# Patient Record
Sex: Female | Born: 1970
Health system: Southern US, Community
[De-identification: ages and names within clinical notes are randomized; demographics above are authoritative.]

## PROBLEM LIST (undated history)

## (undated) DIAGNOSIS — F419 Anxiety disorder, unspecified: Secondary | ICD-10-CM

## (undated) DIAGNOSIS — N281 Cyst of kidney, acquired: Secondary | ICD-10-CM

## (undated) DIAGNOSIS — F32A Depression, unspecified: Secondary | ICD-10-CM

## (undated) DIAGNOSIS — F329 Major depressive disorder, single episode, unspecified: Secondary | ICD-10-CM

## (undated) DIAGNOSIS — M1990 Unspecified osteoarthritis, unspecified site: Secondary | ICD-10-CM

## (undated) DIAGNOSIS — M545 Low back pain: Secondary | ICD-10-CM

## (undated) DIAGNOSIS — M069 Rheumatoid arthritis, unspecified: Secondary | ICD-10-CM

## (undated) DIAGNOSIS — G8929 Other chronic pain: Secondary | ICD-10-CM

## (undated) DIAGNOSIS — T7840XA Allergy, unspecified, initial encounter: Secondary | ICD-10-CM

## (undated) HISTORY — DX: Cyst of kidney, acquired: N28.1

## (undated) HISTORY — DX: Low back pain: M54.5

## (undated) HISTORY — DX: Allergy, unspecified, initial encounter: T78.40XA

## (undated) HISTORY — PX: TONSILLECTOMY: SUR1361

## (undated) HISTORY — PX: ABDOMINAL HYSTERECTOMY: SHX81

## (undated) HISTORY — DX: Other chronic pain: G89.29

## (undated) HISTORY — DX: Unspecified osteoarthritis, unspecified site: M19.90

## (undated) HISTORY — PX: BACK SURGERY: SHX140

## (undated) HISTORY — DX: Major depressive disorder, single episode, unspecified: F32.9

## (undated) HISTORY — DX: Rheumatoid arthritis, unspecified: M06.9

## (undated) HISTORY — DX: Depression, unspecified: F32.A

## (undated) HISTORY — PX: BREAST CYST ASPIRATION: SHX578

## (undated) HISTORY — DX: Anxiety disorder, unspecified: F41.9

## (undated) HISTORY — PX: SPINE SURGERY: SHX786

---

## 2005-07-08 ENCOUNTER — Ambulatory Visit: Payer: Self-pay | Admitting: Family Medicine

## 2005-08-21 ENCOUNTER — Ambulatory Visit: Payer: Self-pay | Admitting: Unknown Physician Specialty

## 2005-10-21 ENCOUNTER — Ambulatory Visit: Payer: Self-pay | Admitting: Unknown Physician Specialty

## 2009-08-14 ENCOUNTER — Ambulatory Visit: Payer: Self-pay | Admitting: Unknown Physician Specialty

## 2009-08-25 ENCOUNTER — Ambulatory Visit: Payer: Self-pay | Admitting: Unknown Physician Specialty

## 2009-11-30 ENCOUNTER — Ambulatory Visit: Payer: Self-pay | Admitting: General Surgery

## 2010-09-10 ENCOUNTER — Ambulatory Visit: Payer: Self-pay | Admitting: Unknown Physician Specialty

## 2010-09-21 ENCOUNTER — Ambulatory Visit: Payer: Self-pay | Admitting: Unknown Physician Specialty

## 2011-03-25 ENCOUNTER — Ambulatory Visit (HOSPITAL_COMMUNITY): Payer: PRIVATE HEALTH INSURANCE

## 2011-03-25 ENCOUNTER — Ambulatory Visit (HOSPITAL_COMMUNITY)
Admission: RE | Admit: 2011-03-25 | Discharge: 2011-03-26 | Disposition: A | Discharge status: Home Health | Payer: PRIVATE HEALTH INSURANCE | Source: Ambulatory Visit | Attending: Neurosurgery | Admitting: Neurosurgery

## 2011-03-25 DIAGNOSIS — M51379 Other intervertebral disc degeneration, lumbosacral region without mention of lumbar back pain or lower extremity pain: Secondary | ICD-10-CM | POA: Insufficient documentation

## 2011-03-25 DIAGNOSIS — M5126 Other intervertebral disc displacement, lumbar region: Secondary | ICD-10-CM | POA: Insufficient documentation

## 2011-03-25 DIAGNOSIS — M47817 Spondylosis without myelopathy or radiculopathy, lumbosacral region: Secondary | ICD-10-CM | POA: Insufficient documentation

## 2011-03-25 DIAGNOSIS — M5137 Other intervertebral disc degeneration, lumbosacral region: Secondary | ICD-10-CM | POA: Insufficient documentation

## 2011-03-25 LAB — CBC
HCT: 41 % (ref 36.0–46.0)
Hemoglobin: 14.1 g/dL (ref 12.0–15.0)
MCH: 30.4 pg (ref 26.0–34.0)
MCHC: 34.4 g/dL (ref 30.0–36.0)
MCV: 88.4 fL (ref 78.0–100.0)
Platelets: 222 10*3/uL (ref 150–400)
RBC: 4.64 MIL/uL (ref 3.87–5.11)
RDW: 13 % (ref 11.5–15.5)
WBC: 9.8 10*3/uL (ref 4.0–10.5)

## 2011-03-25 LAB — SURGICAL PCR SCREEN
MRSA, PCR: NEGATIVE
Staphylococcus aureus: NEGATIVE

## 2011-03-27 NOTE — Op Note (Signed)
[name], [name] NO.:  [id number]  MEDICAL RECORD NO.:  [id number]  LOCATION:  3528                         FACILITY:  [facility]  PHYSICIAN:  Hewitt Shorts, M.D.DATE OF BIRTH:  1970/10/27  DATE OF PROCEDURE:  03/25/2011 DATE OF DISCHARGE:                              OPERATIVE REPORT   PREOPERATIVE DIAGNOSES: 1. Left L5-S1 lumbar disk herniation. 2. Lumbar degenerative disk disease. 3. Lumbar spondylosis. 4. Lumbar radiculopathy.  POSTOPERATIVE DIAGNOSES: 1. Left L5-S1 lumbar disk herniation. 2. Lumbar degenerative disk disease. 3. Lumbar spondylosis. 4. Lumbar radiculopathy.  PROCEDURE:  Left L5-S1 lumbar laminotomy and microdiskectomy with microdissection, microsurgical technique and the operating microscope.  SURGEON:  Hewitt Shorts, MD  ASSISTANT:  Hilda Lias, MD  ANESTHESIA:  General endotracheal.  INDICATION:  The patient is a 40 year old woman who presented with disabling left lumbar radiculopathy.  She was found by MRI scan to have a large left L5-S1 lumbar disk herniation.  Decision was made to proceed with elective laminotomy and microdiskectomy.  PROCEDURE:  The patient was brought to the operating room, placed under general endotracheal anesthesia.  The patient was turned to prone position. Lumbar region was prepped with Betadine soap and solution, draped in sterile fashion.  The midline was infiltrated with local anesthetic with epinephrine and x-Montesdeoca was taken, the L5-S1 level identified and then a midline incision was made, carried down through the subcutaneous tissue.  Bipolar cautery and electrocautery was used to maintain hemostasis.  Dissection was carried down to the lumbar fascia, which was incised in the left side of the midline. The paraspinal muscles were dissected from the spinous process and lamina in a subperiosteal fashion.  Self-retaining retractor was placed and x-Behring taken.  The L5-S1 interlaminar  space was identified.  The operating microscope was draped and brought into the field to provide for additional navigation, illumination, and visualization.  The remainder of decompression was performed using microdissection and microsurgical technique. Laminotomy was performed using the high-speed drill and Kerrison punches.  The ligamentum flavum was carefully removed, and then we identified the thecal sac and exiting left S1 nerve root.  These structures were gently retracted medially, disk herniation identified. The remaining annular fibers were incised and the fragment extruded into the exposure was removed using pituitary rongeur and a single fragment. We examined the epidural space and no further fragments were found.  It was felt that good decompression of the thecal sac and nerve root had been achieved and it was felt that no further decompression would be achieved by entering into the disk space.  Epidural veins were coagulated as needed and wound irrigated with bacitracin solution. Hemostasis was established, confirmed.  Then, we instilled 2 mL of fentanyl with 80 mg of Depo-Medrol into the epidural space and proceeded with closure.  Deep fascia was closed with interrupted undyed 1 Vicryl sutures.  Scarpa fascia was closed with interrupted undyed 0 and 2-0 Vicryl sutures and the subcutaneous and subcuticular closed with interrupted inverted 2-0 undyed Vicryl sutures.  Skin was closed with Dermabond.  Procedure was tolerated well.  Estimated blood loss was 25 mL. Sponge and needle count correct.  Following surgery,  the patient was turned back to supine position to be reversed from the anesthetic, extubated, and transferred to the recovery room for further care.     Hewitt Shorts, M.D.     RWN/MEDQ  D:  03/25/2011  T:  03/26/2011  Job:  [id number]  Electronically Signed by Shirlean Kelly M.D. on 03/27/2011 01:53:25 PM

## 2011-04-24 ENCOUNTER — Ambulatory Visit: Payer: Self-pay | Admitting: Unknown Physician Specialty

## 2011-12-26 ENCOUNTER — Emergency Department: Payer: Self-pay | Admitting: Emergency Medicine

## 2011-12-26 LAB — COMPREHENSIVE METABOLIC PANEL
Albumin: 3.9 g/dL (ref 3.4–5.0)
Alkaline Phosphatase: 61 U/L (ref 50–136)
BUN: 19 mg/dL — ABNORMAL HIGH (ref 7–18)
Bilirubin,Total: 0.5 mg/dL (ref 0.2–1.0)
Creatinine: 0.87 mg/dL (ref 0.60–1.30)
Glucose: 166 mg/dL — ABNORMAL HIGH (ref 65–99)
Osmolality: 282 (ref 275–301)
Sodium: 138 mmol/L (ref 136–145)
Total Protein: 7.7 g/dL (ref 6.4–8.2)

## 2011-12-26 LAB — URINALYSIS, COMPLETE
Blood: NEGATIVE
Glucose,UR: NEGATIVE mg/dL (ref 0–75)
Nitrite: NEGATIVE
RBC,UR: 4 /HPF (ref 0–5)
Squamous Epithelial: 21
WBC UR: 3 /HPF (ref 0–5)

## 2011-12-26 LAB — CBC
HCT: 45.4 % (ref 35.0–47.0)
HGB: 14.7 g/dL (ref 12.0–16.0)
WBC: 11.8 10*3/uL — ABNORMAL HIGH (ref 3.6–11.0)

## 2013-04-16 ENCOUNTER — Emergency Department: Payer: Self-pay | Admitting: Internal Medicine

## 2013-04-16 LAB — COMPREHENSIVE METABOLIC PANEL
Alkaline Phosphatase: 61 U/L (ref 50–136)
Calcium, Total: 8.6 mg/dL (ref 8.5–10.1)
Chloride: 107 mmol/L (ref 98–107)
Co2: 27 mmol/L (ref 21–32)
EGFR (African American): >60
Osmolality: 285 (ref 275–301)
Potassium: 3.6 mmol/L (ref 3.5–5.1)
SGOT(AST): 21 U/L (ref 15–37)
SGPT (ALT): 21 U/L (ref 12–78)

## 2013-04-16 LAB — URINALYSIS, COMPLETE
RBC,UR: 3 /HPF (ref 0–5)
Squamous Epithelial: 7

## 2013-04-16 LAB — CBC
HCT: 39.1 % (ref 35.0–47.0)
MCH: 31 pg (ref 26.0–34.0)
MCHC: 35 g/dL (ref 32.0–36.0)
Platelet: 196 10*3/uL (ref 150–440)
RBC: 4.41 10*6/uL (ref 3.80–5.20)
RDW: 12.7 % (ref 11.5–14.5)
WBC: 8.7 10*3/uL (ref 3.6–11.0)

## 2013-04-16 LAB — CK TOTAL AND CKMB (NOT AT ARMC)
CK, Total: 75 U/L (ref 21–215)
CK-MB: 0.6 ng/mL (ref 0.5–3.6)

## 2013-04-16 LAB — TROPONIN I: Troponin-I: <0.02 ng/mL

## 2014-11-17 ENCOUNTER — Other Ambulatory Visit: Payer: Self-pay | Admitting: Family Medicine

## 2014-11-17 NOTE — Telephone Encounter (Signed)
Pt called stated she needs a refill on Cymbalta. Pharm is [facility] in [place]. Patient stated she took the last dose yesterday. Please call pt with any issues @ [phone]. Thanks.

## 2014-11-17 NOTE — Telephone Encounter (Signed)
Please schedule appt with MAC per PP notes. Thanks!

## 2014-11-18 ENCOUNTER — Telehealth: Payer: Self-pay | Admitting: Family Medicine

## 2014-11-18 NOTE — Telephone Encounter (Signed)
Pt notified that med was sent to pharm yesterday.

## 2014-11-18 NOTE — Telephone Encounter (Signed)
Pt called stated she needs a refill on Symbalta. Pt stated she ran out 2 days ago. Pharm is [facility] [place]. Please call pt with any issues @ [phone]. Pt stated she is leaving to go on vacation tomorrow morning, pt went w/o medication yesterday as well as today.

## 2014-12-15 ENCOUNTER — Other Ambulatory Visit: Payer: Self-pay | Admitting: Family Medicine

## 2014-12-19 ENCOUNTER — Other Ambulatory Visit: Payer: Self-pay

## 2014-12-19 MED ORDER — DULOXETINE HCL 60 MG PO CPEP: 60.0000 mg | ORAL_CAPSULE | Freq: Every day | ORAL | Status: DC

## 2014-12-19 NOTE — Telephone Encounter (Signed)
Pt wants to know if she can get it set up to where she has automatic refills on this RX instead of having to call in every month. Please call pt about this ASAP.

## 2014-12-19 NOTE — Telephone Encounter (Signed)
Patient needs to be seen I sent in refill, but she needs to be seen in the office please She no showed in May

## 2014-12-29 NOTE — Telephone Encounter (Signed)
Busy dial tone

## 2014-12-29 NOTE — Telephone Encounter (Signed)
Patient has a follow up appointment scheduled for July 25th.

## 2015-01-02 ENCOUNTER — Inpatient Hospital Stay: Payer: Self-pay | Admitting: Family Medicine

## 2015-01-19 ENCOUNTER — Other Ambulatory Visit: Payer: Self-pay

## 2015-01-19 MED ORDER — DULOXETINE HCL 60 MG PO CPEP: 60.0000 mg | ORAL_CAPSULE | Freq: Every day | ORAL | Status: DC

## 2015-01-19 NOTE — Telephone Encounter (Signed)
She no showed in May She no showed in July This is the last time I refill this without an appointment If she does not keep or reschedules this next appointment, will talk to office manager about noncompliance and letter

## 2015-01-19 NOTE — Telephone Encounter (Signed)
She scheduled an appointment with you for 01/26/15. She wants to know if you can call in a rx for Cymbalta to her pharmacy.

## 2015-01-26 ENCOUNTER — Encounter: Payer: Self-pay | Admitting: Family Medicine

## 2015-01-26 ENCOUNTER — Ambulatory Visit: Payer: Self-pay | Admitting: Family Medicine

## 2015-01-26 NOTE — Progress Notes (Signed)
This is patient's third consecutive NO SHOW I recommend sending a letter to notify her that if she does not schedule a visit and keep it in the next 30 days for medication refills and montoring, that we will dismiss her from the practice and she'll need to find another prescriber Dr. Sanda Klein

## 2015-02-14 ENCOUNTER — Encounter: Payer: Self-pay | Admitting: Family Medicine

## 2015-02-23 ENCOUNTER — Ambulatory Visit: Payer: Self-pay | Admitting: Family Medicine

## 2015-02-23 ENCOUNTER — Encounter: Payer: Self-pay | Admitting: Family Medicine

## 2015-02-23 NOTE — Progress Notes (Signed)
This was patient's fourth consecutive NO SHOW Sending to office manager

## 2015-03-01 ENCOUNTER — Other Ambulatory Visit: Payer: Self-pay

## 2015-03-01 MED ORDER — DULOXETINE HCL 60 MG PO CPEP: 60.0000 mg | ORAL_CAPSULE | Freq: Every day | ORAL | Status: DC

## 2015-03-01 NOTE — Telephone Encounter (Signed)
Patient called to apologize for missing her appointment on 02/23/15 and rescheduled for 03/10/15.  Pt also request a refill for Cymbalta.  [facility] uses [facility] in [place].  Please call to advise if RX refill is approved.

## 2015-03-01 NOTE — Telephone Encounter (Signed)
Routing to provider  

## 2015-03-01 NOTE — Telephone Encounter (Signed)
She has had four no shows in a row, so I really am not going to approve any more refills beyond this amount (30 pills sent) until she is seen; if she does not keep appointment, if she is not seen in the next 30 days, I recommend she be dismissed for noncompliance

## 2015-03-02 NOTE — Telephone Encounter (Signed)
Patient notified

## 2015-03-10 ENCOUNTER — Encounter: Payer: Self-pay | Admitting: Family Medicine

## 2015-03-10 ENCOUNTER — Ambulatory Visit (INDEPENDENT_AMBULATORY_CARE_PROVIDER_SITE_OTHER): Payer: PRIVATE HEALTH INSURANCE | Admitting: Family Medicine

## 2015-03-10 VITALS — BP 126/86 | HR 79 | Temp 97.3°F | Ht 66.25 in | Wt 168.0 lb

## 2015-03-10 DIAGNOSIS — R634 Abnormal weight loss: Secondary | ICD-10-CM | POA: Diagnosis not present

## 2015-03-10 DIAGNOSIS — M47817 Spondylosis without myelopathy or radiculopathy, lumbosacral region: Secondary | ICD-10-CM | POA: Diagnosis not present

## 2015-03-10 DIAGNOSIS — R5383 Other fatigue: Secondary | ICD-10-CM | POA: Diagnosis not present

## 2015-03-10 DIAGNOSIS — E559 Vitamin D deficiency, unspecified: Secondary | ICD-10-CM | POA: Diagnosis not present

## 2015-03-10 DIAGNOSIS — Z6379 Other stressful life events affecting family and household: Secondary | ICD-10-CM | POA: Insufficient documentation

## 2015-03-10 DIAGNOSIS — M4806 Spinal stenosis, lumbar region: Secondary | ICD-10-CM

## 2015-03-10 DIAGNOSIS — M48061 Spinal stenosis, lumbar region without neurogenic claudication: Secondary | ICD-10-CM

## 2015-03-10 DIAGNOSIS — Z0000 Encounter for general adult medical examination without abnormal findings: Secondary | ICD-10-CM

## 2015-03-10 DIAGNOSIS — F4321 Adjustment disorder with depressed mood: Secondary | ICD-10-CM | POA: Diagnosis not present

## 2015-03-10 MED ORDER — DULOXETINE HCL 30 MG PO CPEP: 30.0000 mg | ORAL_CAPSULE | Freq: Every day | ORAL | Status: DC

## 2015-03-10 NOTE — Progress Notes (Signed)
BP 126/86 mmHg  Pulse 79  Temp(Src) 97.3 F (36.3 C)  Ht 5' 6.25" (1.683 m)  Wt 168 lb (76.204 kg)  BMI 26.90 kg/m2  SpO2 97%   Subjective:    Patient ID: [name], female    DOB: 1970-12-07, 44 y.o.   MRN: [id number]  HPI: [name] is a 44 y.o. female  Chief Complaint  Patient presents with  . Depression    would also like to maybe find a conselor  . Anxiety   Patient is here for follow-up after several missed visits; she has depression and it has worsened recently; she is still taking her cymbalta, 60 mg daily She and her husband have been separated for 7 months; she is not in any danger or has any fear for herself or her child; not in any counseling She has lost significant weight during this time, which she attributes to stress, not eating well  Depression screen PHQ 2/9 03/10/2015  Decreased Interest 1  Down, Depressed, Hopeless 2  PHQ - 2 Score 3  Altered sleeping 2  Tired, decreased energy 2  Change in appetite 2  Feeling bad or failure about yourself  2  Trouble concentrating 0  Moving slowly or fidgety/restless 0  Suicidal thoughts 0  PHQ-9 Score 11  Difficult doing work/chores Somewhat difficult   She has chronic back pain and sees another provider for that; she does believe the cymbalta helps with the back pain  Relevant past medical, surgical, family and social history reviewed and updated as indicated. Interim medical history since our last visit reviewed.  Personal hx of low vit D; chronic back pain  Mother has diabetes; no thyroid disease in the family Allergies and medications reviewed and updated.  Review of Systems  Constitutional: Positive for unexpected weight change (from stress).  HENT: Negative for trouble swallowing and voice change.   Eyes: Negative for visual disturbance.  Respiratory: Negative for cough.   Cardiovascular: Negative for palpitations.  Gastrointestinal: Positive for constipation (just a little, takes narcotics for  back pain; not any worse in last six months). Negative for blood in stool.  Endocrine: Negative for polydipsia.  Genitourinary: Negative for hematuria.  Skin:       Has noticed a little hair loss, some stress maybe; no dry skin  Neurological: Negative for tremors.  Psychiatric/Behavioral: Positive for sleep disturbance (not real bad) and dysphoric mood. Negative for self-injury.   Per HPI unless specifically indicated above     Objective:    BP 126/86 mmHg  Pulse 79  Temp(Src) 97.3 F (36.3 C)  Ht 5' 6.25" (1.683 m)  Wt 168 lb (76.204 kg)  BMI 26.90 kg/m2  SpO2 97%  Wt Readings from Last 3 Encounters:  03/10/15 168 lb (76.204 kg)  04/12/14 183 lb (83.008 kg)    Physical Exam  Constitutional: She appears well-developed and well-nourished.  Weight loss 15 pounds noted  HENT:  Mouth/Throat: Mucous membranes are normal.  Eyes: EOM are normal. No scleral icterus.  Neck: No thyromegaly present.  Cardiovascular: Normal rate and regular rhythm.   Pulmonary/Chest: Effort normal and breath sounds normal.  Abdominal: She exhibits no distension.  Neurological: She is alert. She displays no tremor. Gait normal.  Skin:  No appreciable scalp hair loss or visible alopecia  Psychiatric: Her behavior is normal. Judgment normal. Her mood appears not anxious. Her affect is blunt. Her affect is not labile and not inappropriate. Her speech is not delayed. She is not slowed and  not withdrawn. Cognition and memory are normal. She exhibits a depressed mood. She expresses no homicidal and no suicidal ideation.  Good hygiene, good eye contact with examiner      Assessment & Plan:   Problem List Items Addressed This Visit      Musculoskeletal and Integument   Lumbosacral spondylosis without myelopathy    On pain medicines from outside provider; reviewed note from Dr. Esmeralda Arthur dated January 11, 2013        Other   Abnormal weight loss    Likely related to eating pattern change with marital  separation, stress; will check TSH to make sure not thryoid-related      Relevant Orders   TSH (Completed)   Adjustment disorder with depressed mood - Primary    Patient going through marital separation right now; suggested counseling and gave her list of counselors to review, patient to call and schedule her own appt; supportive listening provided; no abuse, no concern for danger; increase cymbalta from 60 mg daily to 90 mg daily; seek medical care if needed, dark thoughts; call before next visit if needed; return for close f/u; see AVS      Stressful life events affecting family and household    Marital separation; encouraged her to start counseling      Preventative health care    Checking other labs today, so will include yearly screening labs (CPE NOT done today, just the lab component)      Relevant Orders   CBC with Differential/Platelet (Completed)   Comprehensive metabolic panel (Completed)   Lipid Panel w/o Chol/HDL Ratio (Completed)   Vitamin D deficiency    Check level and supplement if needed, as low vit D can exacerbate depressed mood, cause fatigue      Relevant Orders   Vit D  25 hydroxy (rtn osteoporosis monitoring) (Completed)   Spinal stenosis of lumbar region    On pain medicines from outside provider; reviewed note from Dr. Esmeralda Arthur dated January 11, 2013       Other Visit Diagnoses    Decreased energy        Relevant Orders    Vitamin B12 (Completed)       Follow up plan: Return in about 3 weeks (around 03/31/2015) for medication change follow-up, etc..  Medications Discontinued During This Encounter  Medication Reason  . OPANA ER, CRUSH RESISTANT, 5 MG T12A Patient has not taken in last 30 days  . OXYCONTIN 10 MG T12A 12 hr tablet Patient has not taken in last 30 days  . DULoxetine (CYMBALTA) 60 MG capsule Dose change    Meds ordered this encounter  Medications  . DULoxetine (CYMBALTA) 30 MG capsule    Sig: Take 1 capsule (30 mg total) by mouth  daily.    Dispense:  90 capsule    Refill:  3    New higher dose   Orders Placed This Encounter  Procedures  . Vit D  25 hydroxy (rtn osteoporosis monitoring)  . TSH  . CBC with Differential/Platelet  . Comprehensive metabolic panel  . Lipid Panel w/o Chol/HDL Ratio  . Vitamin B12   An after-visit summary was printed and given to the patient at [facility].  Please see the patient instructions which may contain other information and recommendations beyond what is mentioned above in the assessment and plan.

## 2015-03-10 NOTE — Patient Instructions (Addendum)
Please do start working with a counselor; see the hand-out for information, names, phone numbers, etc. Do increase the cymbalta from 60 mg daily to 90 mg daily We'll contact you about the lab results I'll encourage you to get outdoors three days a week (weather permitting) Return in 3 weeks for follow-up  Adjustment Disorder Most changes in life can cause stress. Getting used to changes may take a few months or longer. If feelings of stress, hopelessness, or worry continue, you may have an adjustment disorder. This stress-related mental health problem may affect your feelings, thinking and how you act. It occurs in both sexes and happens at any age. SYMPTOMS  Some of the following problems may be seen and vary from person to person:  Sadness or depression.  Loss of enjoyment.  Thoughts of suicide.  Fighting.  Avoiding family and friends.  Poor school performance.  Hopelessness, sense of loss.  Trouble sleeping.  Vandalism.  Worry, weight loss or gain.  Crying spells.  Anxiety  Reckless driving.  Skipping school.  Poor work Systems analyst.  Nervousness.  Ignoring bills.  Poor attitude. DIAGNOSIS  Your caregiver will ask what has happened in your life and do a physical exam. They will make a diagnosis of an adjustment disorder when they are sure another problem or medical illness causing your feelings does not exist. TREATMENT  When problems caused by stress interfere with you daily life or last longer than a few months, you may need counseling for an adjustment disorder. Early treatment may diminish problems and help you to better cope with the stressful events in your life. Sometimes medication is necessary. Individual counseling and or support groups can be very helpful. PROGNOSIS  Adjustment disorders usually last less than 3 to 6 months. The condition may persist if there is long lasting stress. This could include health problems, relationship problems, or job  difficulties where you can not easily escape from what is causing the problem. PREVENTION  Even the most mentally healthy, highly functioning people can suffer from an adjustment disorder given a significant blow from a life-changing event. There is no way to prevent pain and loss. Most people need help from time to time. You are not alone. SEEK MEDICAL CARE IF:  Your feelings or symptoms listed above do not improve or worsen. Document Released: 01/29/2006 Document Revised: 08/19/2011 Document Reviewed: 04/22/2007 [facility] Patient Information 2015 [place], [place]. This information is not intended to replace advice given to you by your health care provider. Make sure you discuss any questions you have with your health care provider. Stress and Stress Management Stress is a normal reaction to life events. It is what you feel when life demands more than you are used to or more than you can handle. Some stress can be useful. For example, the stress reaction can help you catch the last bus of the day, study for a test, or meet a deadline at work. But stress that occurs too often or for too long can cause problems. It can affect your emotional health and interfere with relationships and normal daily activities. Too much stress can weaken your immune system and increase your risk for physical illness. If you already have a medical problem, stress can make it worse. CAUSES  All sorts of life events may cause stress. An event that causes stress for one person may not be stressful for another person. Major life events commonly cause stress. These may be positive or negative. Examples include losing your job, moving into  a new home, getting married, having a baby, or losing a loved one. Less obvious life events may also cause stress, especially if they occur day after day or in combination. Examples include working long hours, driving in traffic, caring for children, being in debt, or being in a difficult  relationship. SIGNS AND SYMPTOMS Stress may cause emotional symptoms including, the following:  Anxiety. This is feeling worried, afraid, on edge, overwhelmed, or out of control.  Anger. This is feeling irritated or impatient.  Depression. This is feeling sad, down, helpless, or guilty.  Difficulty focusing, remembering, or making decisions. Stress may cause physical symptoms, including the following:   Aches and pains. These may affect your head, neck, back, stomach, or other areas of your body.  Tight muscles or clenched jaw.  Low energy or trouble sleeping. Stress may cause unhealthy behaviors, including the following:   Eating to feel better (overeating) or skipping meals.  Sleeping too little, too much, or both.  Working too much or putting off tasks (procrastination).  Smoking, drinking alcohol, or using drugs to feel better. DIAGNOSIS  Stress is diagnosed through an assessment by your health care provider. Your health care provider will ask questions about your symptoms and any stressful life events.Your health care provider will also ask about your medical history and may order blood tests or other tests. Certain medical conditions and medicine can cause physical symptoms similar to stress. Mental illness can cause emotional symptoms and unhealthy behaviors similar to stress. Your health care provider may refer you to a mental health professional for further evaluation.  TREATMENT  Stress management is the recommended treatment for stress.The goals of stress management are reducing stressful life events and coping with stress in healthy ways.  Techniques for reducing stressful life events include the following:  Stress identification. Self-monitor for stress and identify what causes stress for you. These skills may help you to avoid some stressful events.  Time management. Set your priorities, keep a calendar of events, and learn to say "no." These tools can help you  avoid making too many commitments. Techniques for coping with stress include the following:  Rethinking the problem. Try to think realistically about stressful events rather than ignoring them or overreacting. Try to find the positives in a stressful situation rather than focusing on the negatives.  Exercise. Physical exercise can release both physical and emotional tension. The key is to find a form of exercise you enjoy and do it regularly.  Relaxation techniques. These relax the body and mind. Examples include yoga, meditation, tai chi, biofeedback, deep breathing, progressive muscle relaxation, listening to music, being out in nature, journaling, and other hobbies. Again, the key is to find one or more that you enjoy and can do regularly.  Healthy lifestyle. Eat a balanced diet, get plenty of sleep, and do not smoke. Avoid using alcohol or drugs to relax.  Strong support network. Spend time with family, friends, or other people you enjoy being around.Express your feelings and talk things over with someone you trust. Counseling or talktherapy with a mental health professional may be helpful if you are having difficulty managing stress on your own. Medicine is typically not recommended for the treatment of stress.Talk to your health care provider if you think you need medicine for symptoms of stress. HOME CARE INSTRUCTIONS  Keep all follow-up visits as directed by your health care provider.  Take all medicines as directed by your health care provider. SEEK MEDICAL CARE IF:  Your symptoms get  worse or you start having new symptoms.  You feel overwhelmed by your problems and can no longer manage them on your own. SEEK IMMEDIATE MEDICAL CARE IF:  You feel like hurting yourself or someone else. Document Released: 11/20/2000 Document Revised: 10/11/2013 Document Reviewed: 01/19/2013 [facility] Patient Information 2015 [place], [place]. This information is not intended to replace advice given  to you by your health care provider. Make sure you discuss any questions you have with your health care provider.

## 2015-03-11 LAB — COMPREHENSIVE METABOLIC PANEL
A/G RATIO: 2 (ref 1.1–2.5)
ALBUMIN: 4.5 g/dL (ref 3.5–5.5)
ALT: 16 IU/L (ref 0–32)
AST: 23 IU/L (ref 0–40)
Alkaline Phosphatase: 53 IU/L (ref 39–117)
BILIRUBIN TOTAL: 0.5 mg/dL (ref 0.0–1.2)
BUN / CREAT RATIO: 27 — AB (ref 9–23)
BUN: 23 mg/dL (ref 6–24)
CHLORIDE: 98 mmol/L (ref 97–108)
CO2: 26 mmol/L (ref 18–29)
Calcium: 10 mg/dL (ref 8.7–10.2)
Creatinine, Ser: 0.84 mg/dL (ref 0.57–1.00)
GFR calc non Af Amer: 84 mL/min/{1.73_m2} (ref >59)
GFR, EST AFRICAN AMERICAN: 96 mL/min/{1.73_m2} (ref >59)
GLOBULIN, TOTAL: 2.2 g/dL (ref 1.5–4.5)
Glucose: 89 mg/dL (ref 65–99)
POTASSIUM: 4.7 mmol/L (ref 3.5–5.2)
SODIUM: 140 mmol/L (ref 134–144)
TOTAL PROTEIN: 6.7 g/dL (ref 6.0–8.5)

## 2015-03-11 LAB — CBC WITH DIFFERENTIAL/PLATELET
BASOS ABS: 0 10*3/uL (ref 0.0–0.2)
Basos: 0 %
EOS (ABSOLUTE): 0.4 10*3/uL (ref 0.0–0.4)
EOS: 4 %
Hematocrit: 42.7 % (ref 34.0–46.6)
Hemoglobin: 14.9 g/dL (ref 11.1–15.9)
IMMATURE GRANULOCYTES: 0 %
Immature Grans (Abs): 0 10*3/uL (ref 0.0–0.1)
LYMPHS ABS: 2.8 10*3/uL (ref 0.7–3.1)
Lymphs: 26 %
MCH: 30.2 pg (ref 26.6–33.0)
MCHC: 34.9 g/dL (ref 31.5–35.7)
MCV: 86 fL (ref 79–97)
MONOS ABS: 0.5 10*3/uL (ref 0.1–0.9)
Monocytes: 4 %
NEUTROS PCT: 66 %
Neutrophils Absolute: 7.1 10*3/uL — ABNORMAL HIGH (ref 1.4–7.0)
Platelets: 232 10*3/uL (ref 150–379)
RBC: 4.94 x10E6/uL (ref 3.77–5.28)
RDW: 12.8 % (ref 12.3–15.4)
WBC: 10.8 10*3/uL (ref 3.4–10.8)

## 2015-03-11 LAB — LIPID PANEL W/O CHOL/HDL RATIO
Cholesterol, Total: 213 mg/dL — ABNORMAL HIGH (ref 100–199)
HDL: 55 mg/dL (ref >39)
LDL Calculated: 128 mg/dL — ABNORMAL HIGH (ref 0–99)
Triglycerides: 151 mg/dL — ABNORMAL HIGH (ref 0–149)
VLDL CHOLESTEROL CAL: 30 mg/dL (ref 5–40)

## 2015-03-11 LAB — VITAMIN B12: Vitamin B-12: 463 pg/mL (ref 211–946)

## 2015-03-11 LAB — VITAMIN D 25 HYDROXY (VIT D DEFICIENCY, FRACTURES): VIT D 25 HYDROXY: 35.8 ng/mL (ref 30.0–100.0)

## 2015-03-11 LAB — TSH: TSH: 0.863 u[IU]/mL (ref 0.450–4.500)

## 2015-03-12 ENCOUNTER — Encounter: Payer: Self-pay | Admitting: Family Medicine

## 2015-03-12 DIAGNOSIS — M47817 Spondylosis without myelopathy or radiculopathy, lumbosacral region: Secondary | ICD-10-CM | POA: Insufficient documentation

## 2015-03-12 DIAGNOSIS — M48061 Spinal stenosis, lumbar region without neurogenic claudication: Secondary | ICD-10-CM | POA: Insufficient documentation

## 2015-03-12 NOTE — Assessment & Plan Note (Signed)
On pain medicines from outside provider; reviewed note from Dr. Esmeralda Arthur dated January 11, 2013

## 2015-03-12 NOTE — Assessment & Plan Note (Signed)
Check level and supplement if needed, as low vit D can exacerbate depressed mood, cause fatigue

## 2015-03-12 NOTE — Assessment & Plan Note (Signed)
Checking other labs today, so will include yearly screening labs (CPE NOT done today, just the lab component)

## 2015-03-12 NOTE — Assessment & Plan Note (Signed)
Patient going through marital separation right now; suggested counseling and gave her list of counselors to review, patient to call and schedule her own appt; supportive listening provided; no abuse, no concern for danger; increase cymbalta from 60 mg daily to 90 mg daily; seek medical care if needed, dark thoughts; call before next visit if needed; return for close f/u; see AVS

## 2015-03-12 NOTE — Assessment & Plan Note (Signed)
Marital separation; encouraged her to start counseling

## 2015-03-12 NOTE — Assessment & Plan Note (Signed)
Likely related to eating pattern change with marital separation, stress; will check TSH to make sure not thryoid-related

## 2015-04-03 ENCOUNTER — Encounter: Payer: Self-pay | Admitting: Family Medicine

## 2015-04-03 ENCOUNTER — Ambulatory Visit (INDEPENDENT_AMBULATORY_CARE_PROVIDER_SITE_OTHER): Payer: PRIVATE HEALTH INSURANCE | Admitting: Family Medicine

## 2015-04-03 VITALS — BP 113/81 | HR 85 | Temp 97.4°F | Wt 171.0 lb

## 2015-04-03 DIAGNOSIS — R350 Frequency of micturition: Secondary | ICD-10-CM | POA: Diagnosis not present

## 2015-04-03 DIAGNOSIS — F4321 Adjustment disorder with depressed mood: Secondary | ICD-10-CM

## 2015-04-03 DIAGNOSIS — Z1239 Encounter for other screening for malignant neoplasm of breast: Secondary | ICD-10-CM | POA: Diagnosis not present

## 2015-04-03 DIAGNOSIS — N3289 Other specified disorders of bladder: Secondary | ICD-10-CM | POA: Diagnosis not present

## 2015-04-03 LAB — UA/M W/RFLX CULTURE, ROUTINE
Bilirubin, UA: NEGATIVE
Glucose, UA: NEGATIVE
Ketones, UA: NEGATIVE
LEUKOCYTES UA: NEGATIVE
NITRITE UA: NEGATIVE
PH UA: 8.5 — AB (ref 5.0–7.5)
PROTEIN UA: NEGATIVE
RBC, UA: NEGATIVE
SPEC GRAV UA: 1.015 (ref 1.005–1.030)
Urobilinogen, Ur: 0.2 mg/dL (ref 0.2–1.0)

## 2015-04-03 MED ORDER — DULOXETINE HCL 30 MG PO CPEP: 90.0000 mg | ORAL_CAPSULE | Freq: Every day | ORAL | Status: DC

## 2015-04-03 MED ORDER — PHENAZOPYRIDINE HCL 100 MG PO TABS: 100.0000 mg | ORAL_TABLET | Freq: Three times a day (TID) | ORAL | Status: DC | PRN

## 2015-04-03 NOTE — Patient Instructions (Addendum)
Please do call to schedule your mammogram; the number to schedule one at either [facility] or [facility] is [phone] Continue the Cymbalta at 90 mg daily (that will be three pills of the 30 mg strength) STOP all caffeine and chocolate and tea products Try the new medicine for bladder irritation, but call me if symptoms persist and we can recheck urine and/or have you see a urologist

## 2015-04-03 NOTE — Progress Notes (Signed)
BP 113/81 mmHg  Pulse 85  Temp(Src) 97.4 F (36.3 C)  Wt 171 lb (77.565 kg)  SpO2 96%   Subjective:    Patient ID: [name], female    DOB: Aug 06, 1970, 44 y.o.   MRN: [id number]  HPI: [name] is a 44 y.o. female  Chief Complaint  Patient presents with  . Depression  . Urinary Tract Infection    she is having urinary frequency, kidney pain, pressure down below.   Depression screen [facility] 2/9 04/03/2015 03/10/2015  Decreased Interest 1 1  Down, Depressed, Hopeless 1 2  PHQ - 2 Score 2 3  Altered sleeping 1 2  Tired, decreased energy 1 2  Change in appetite 1 2  Feeling bad or failure about yourself  1 2  Trouble concentrating 0 0  Moving slowly or fidgety/restless 0 0  Suicidal thoughts 0 0  PHQ-9 Score 6 11  Difficult doing work/chores Not difficult at all Somewhat difficult    She is having pressure and achiness and urinary frequency last night; the pressure and achiness started just this morning; no long car trips, she does get up at night to urinate, does drink liquids before bed; she was borderline diabetic at the gynecologist, sees gyn for well woman care; lady in [place] retired and now the other retired; last physical was awhile ago, year and a half ago Urine today was unremarkable (dip reviewed); no discharge; no pain with bowel movements; s/p hysterectomy (endometriosis) and unilateral salpingo-oophorectomy (endometriosis), no cancer or precancer; daughter had urethral stricutre problems and s/p stricturotomy; patient does drink a lot of caffeine; instant coffee, that is new  On the higher dose of medicine, mood is better; PHQ-9 is down; does help her back some; still taking her pain medicine, but has been able to reduce her narcotics  Relevant past medical, surgical, family and social history reviewed and updated as indicated. Interim medical history since our last visit reviewed. Allergies and medications reviewed and updated.  Review of Systems  Per HPI  unless specifically indicated above     Objective:    BP 113/81 mmHg  Pulse 85  Temp(Src) 97.4 F (36.3 C)  Wt 171 lb (77.565 kg)  SpO2 96%  Wt Readings from Last 3 Encounters:  04/03/15 171 lb (77.565 kg)  03/10/15 168 lb (76.204 kg)  04/12/14 183 lb (83.008 kg)    Physical Exam  Constitutional: She appears well-developed and well-nourished.  Cardiovascular: Normal rate and regular rhythm.   Pulmonary/Chest: Effort normal and breath sounds normal.  Abdominal: Soft. She exhibits no distension. There is tenderness in the suprapubic area.  Psychiatric: She has a normal mood and affect. Her behavior is normal. Judgment and thought content normal.    Results for orders placed or performed in visit on 03/10/15  Vit D  25 hydroxy (rtn osteoporosis monitoring)  Result Value Ref Range   Vit D, 25-Hydroxy 35.8 30.0 - 100.0 ng/mL  TSH  Result Value Ref Range   TSH 0.863 0.450 - 4.500 uIU/mL  CBC with Differential/Platelet  Result Value Ref Range   WBC 10.8 3.4 - 10.8 x10E3/uL   RBC 4.94 3.77 - 5.28 x10E6/uL   Hemoglobin 14.9 11.1 - 15.9 g/dL   Hematocrit 42.7 34.0 - 46.6 %   MCV 86 79 - 97 fL   MCH 30.2 26.6 - 33.0 pg   MCHC 34.9 31.5 - 35.7 g/dL   RDW 12.8 12.3 - 15.4 %   Platelets 232 150 - 379  x10E3/uL   Neutrophils 66 %   Lymphs 26 %   Monocytes 4 %   Eos 4 %   Basos 0 %   Neutrophils Absolute 7.1 (H) 1.4 - 7.0 x10E3/uL   Lymphocytes Absolute 2.8 0.7 - 3.1 x10E3/uL   Monocytes Absolute 0.5 0.1 - 0.9 x10E3/uL   EOS (ABSOLUTE) 0.4 0.0 - 0.4 x10E3/uL   Basophils Absolute 0.0 0.0 - 0.2 x10E3/uL   Immature Granulocytes 0 %   Immature Grans (Abs) 0.0 0.0 - 0.1 x10E3/uL  Comprehensive metabolic panel  Result Value Ref Range   Glucose 89 65 - 99 mg/dL   BUN 23 6 - 24 mg/dL   Creatinine, Ser 0.84 0.57 - 1.00 mg/dL   GFR calc non Af Amer 84 >59 mL/min/1.73   GFR calc Af Amer 96 >59 mL/min/1.73   BUN/Creatinine Ratio 27 (H) 9 - 23   Sodium 140 134 - 144 mmol/L    Potassium 4.7 3.5 - 5.2 mmol/L   Chloride 98 97 - 108 mmol/L   CO2 26 18 - 29 mmol/L   Calcium 10.0 8.7 - 10.2 mg/dL   Total Protein 6.7 6.0 - 8.5 g/dL   Albumin 4.5 3.5 - 5.5 g/dL   Globulin, Total 2.2 1.5 - 4.5 g/dL   Albumin/Globulin Ratio 2.0 1.1 - 2.5   Bilirubin Total 0.5 0.0 - 1.2 mg/dL   Alkaline Phosphatase 53 39 - 117 IU/L   AST 23 0 - 40 IU/L   ALT 16 0 - 32 IU/L  Lipid Panel w/o Chol/HDL Ratio  Result Value Ref Range   Cholesterol, Total 213 (H) 100 - 199 mg/dL   Triglycerides 151 (H) 0 - 149 mg/dL   HDL 55 >39 mg/dL   VLDL Cholesterol Cal 30 5 - 40 mg/dL   LDL Calculated 128 (H) 0 - 99 mg/dL  Vitamin B12  Result Value Ref Range   Vitamin B-12 463 211 - 946 pg/mL      Assessment & Plan:   Problem List Items Addressed This Visit      Other   Adjustment disorder with depressed mood    Improved on the 90 mg of Cymbalta daily; refills provided      Breast cancer screening    Order given; patient denies breast problems      Relevant Orders   MM DIGITAL SCREENING BILATERAL   Bladder irritation    Urine unremarkable; may be irritation from the coffee (bladder stimulant) or may be IC; stop call methylxanthines and hydrate; try pyridium; return for recheck if symptoms persist       Other Visit Diagnoses    Urinary frequency    -  Primary    Relevant Orders    UA/M w/rflx Culture, Routine        Follow up plan: Return any time in the next month or two, for complete physical, and then 6 months for medicine f/u.  Meds ordered this encounter  Medications  . DISCONTD: DULoxetine (CYMBALTA) 60 MG capsule    Sig: Take 60 mg by mouth daily.    Refill:  0  . phenazopyridine (PYRIDIUM) 100 MG tablet    Sig: Take 1 tablet (100 mg total) by mouth 3 (three) times daily as needed for pain.    Dispense:  10 tablet    Refill:  1  . DULoxetine (CYMBALTA) 30 MG capsule    Sig: Take 3 capsules (90 mg total) by mouth daily.    Dispense:  90 capsule  Refill:  6     She is now taking 90 mg per day

## 2015-04-03 NOTE — Assessment & Plan Note (Signed)
Improved on the 90 mg of Cymbalta daily; refills provided

## 2015-04-03 NOTE — Assessment & Plan Note (Signed)
Urine unremarkable; may be irritation from the coffee (bladder stimulant) or may be IC; stop call methylxanthines and hydrate; try pyridium; return for recheck if symptoms persist

## 2015-04-03 NOTE — Assessment & Plan Note (Signed)
Order given; patient denies breast problems

## 2015-04-24 ENCOUNTER — Encounter: Payer: PRIVATE HEALTH INSURANCE | Admitting: Family Medicine

## 2015-06-21 ENCOUNTER — Ambulatory Visit: Payer: PRIVATE HEALTH INSURANCE | Attending: Family Medicine

## 2015-08-02 ENCOUNTER — Ambulatory Visit (INDEPENDENT_AMBULATORY_CARE_PROVIDER_SITE_OTHER): Payer: Managed Care, Other (non HMO) | Admitting: Family Medicine

## 2015-08-02 ENCOUNTER — Encounter: Payer: Self-pay | Admitting: Family Medicine

## 2015-08-02 ENCOUNTER — Telehealth: Payer: Self-pay | Admitting: Family Medicine

## 2015-08-02 VITALS — BP 122/85 | HR 89 | Temp 98.6°F | Ht 65.75 in | Wt 174.8 lb

## 2015-08-02 DIAGNOSIS — F4321 Adjustment disorder with depressed mood: Secondary | ICD-10-CM

## 2015-08-02 DIAGNOSIS — M4806 Spinal stenosis, lumbar region: Secondary | ICD-10-CM | POA: Diagnosis not present

## 2015-08-02 DIAGNOSIS — M47817 Spondylosis without myelopathy or radiculopathy, lumbosacral region: Secondary | ICD-10-CM | POA: Diagnosis not present

## 2015-08-02 DIAGNOSIS — Z23 Encounter for immunization: Secondary | ICD-10-CM | POA: Diagnosis not present

## 2015-08-02 DIAGNOSIS — M48061 Spinal stenosis, lumbar region without neurogenic claudication: Secondary | ICD-10-CM

## 2015-08-02 NOTE — Telephone Encounter (Signed)
Dr. Loreen Freud called; disc degeneration and spinal stenosis; he is going to do a fusion for her; Dr. Ileene Rubens did her last surgery She tested positive for methadone on a UDS; she is on chronic oxycontin and oxycodone; he only uses short-term pain medicines Dr. Gar Ponto will taper her post-op In process of getting her ready for surgery, she talked to scheduler about this being life and death and she would kill herself if she didn't get surgery Threats of self-harm voiced if she doesn't get surgery; she is taking cymbalta I will have my staff call her today and get her in to see me tomorrow  He does not think she'll be denied for surgery -------------------------- I called patient; she is coming to see me at 3 pm

## 2015-08-02 NOTE — Patient Instructions (Addendum)
Good luck with your surgery Call with any problems beforehand You received the flu shot today; it should protect you against the flu virus over the coming months; it will take about two weeks for antibodies to develop; do try to stay away from hospitals, nursing homes, and daycares during peak flu season; taking extra vitamin C daily during flu season may help you avoid getting sick Please do call [phone] if needed for any crisis moment, or call 911 if any dark thoughts of thoughts of self-harm Please schedule f/u appointment at [facility], and come in for a physical in the next few months when healed up

## 2015-08-02 NOTE — Progress Notes (Signed)
BP 122/85 mmHg  Pulse 89  Temp(Src) 98.6 F (37 C)  Ht 5' 5.75" (1.67 m)  Wt 174 lb 12.8 oz (79.289 kg)  BMI 28.43 kg/m2  SpO2 97%   Subjective:    Patient ID: [name], female    DOB: [birth year], 45 y.o.   MRN: [id number]  HPI: [name] is a 45 y.o. female  Chief Complaint  Patient presents with  . Surgical Clearance    At [facility] for patient's lower back. Putting new rod in for ruptured disc.    Depression screen [facility] 2/9 08/02/2015 04/03/2015 03/10/2015  Decreased Interest 1 1 1   Down, Depressed, Hopeless 1 1 2   PHQ - 2 Score 2 2 3   Altered sleeping 2 1 2   Tired, decreased energy 1 1 2   Change in appetite 0 1 2  Feeling bad or failure about yourself  1 1 2   Trouble concentrating 0 0 0  Moving slowly or fidgety/restless 0 0 0  Suicidal thoughts 1 0 0  PHQ-9 Score 7 6 11   Difficult doing work/chores Somewhat difficult Not difficult at all Somewhat difficult   Patient is here for a visit prior to surgery She is working with the surgeon to have surgery for her back soon; see phone note She is working with them to get funds squared away She was upset hearing the receptionist talk with her about the funds, delaying the physical if they didn't have the $3000 ahead of time, just wanted one business day extra; then was told the insurance  Doctor will likely do a peer-to-peer to get approval She was just so frustrated when she was told it wasn't approved and they canceled the physical; she was just at a desperation point Sleeping okay other than her back issues After surgery, she'll taper her off of pain meds I asked specifically about the suicidal thoughts; she says she just spoke out, was upset with the staff member she was talking to and wanted to let her know how important this was for her to have this surgery; she is not going to act  GAD 7 : Generalized Anxiety Score 08/02/2015  Nervous, Anxious, on Edge 0  Control/stop worrying 2  Worry too much - different things 0   Trouble relaxing 3  Restless 2  Easily annoyed or irritable 0  Afraid - awful might happen 2  Total GAD 7 Score 9  Anxiety Difficulty Not difficult at all   The worry is mostly about health and back issues No boils or sores, no dysuria; no recent fevers; does recall that at her last surgery she did wake up during surgery and she has no recollection No breathing issues; no chest pain No easy bruising or bleeding  Relevant past medical, surgical, family and social history reviewed and updated as indicated. Interim medical history since our last visit reviewed. Medications reviewed  Review of Systems Per HPI unless specifically indicated above     Objective:    BP 122/85 mmHg  Pulse 89  Temp(Src) 98.6 F (37 C)  Ht 5' 5.75" (1.67 m)  Wt 174 lb 12.8 oz (79.289 kg)  BMI 28.43 kg/m2  SpO2 97%  Wt Readings from Last 3 Encounters:  08/02/15 174 lb 12.8 oz (79.289 kg)  04/03/15 171 lb (77.565 kg)  03/10/15 168 lb (76.204 kg)    Physical Exam  Constitutional: She appears well-developed and well-nourished. No distress.  Cardiovascular: Normal rate and regular rhythm.   Pulmonary/Chest: Effort normal and breath sounds  normal.  Abdominal: Normal appearance. She exhibits no distension.  Psychiatric: Her mood appears not anxious. Her affect is not angry, not blunt, not labile and not inappropriate. Her speech is not rapid and/or pressured, not delayed, not tangential and not slurred. She is not agitated, not aggressive, not hyperactive, not slowed, not withdrawn, not actively hallucinating and not combative. Thought content is not paranoid and not delusional. Cognition and memory are not impaired. She does not express impulsivity or inappropriate judgment. She does not exhibit a depressed mood. She expresses no homicidal and no suicidal ideation. She exhibits normal recent memory and normal remote memory. She is attentive.        Assessment & Plan:   Problem List Items Addressed This  Visit      Musculoskeletal and Integument   Lumbosacral spondylosis without myelopathy    Plans to have surgery with Dr. Velna Ochs soon      Relevant Medications   OXYCONTIN 10 MG 12 hr tablet     Other   Adjustment disorder with depressed mood    After talking with patient, I do not believe she was serious about wanting to hurt herself; I believe she was impulsive and upset; she verbalizes that she will not hurt herself; continue cymbalta at current dose; I do not believe she has any current psychiatric reason to postpone or defer her surgery      Spinal stenosis of lumbar region - Primary    Upcoming surgery planned with Dr. Velna Ochs       Other Visit Diagnoses    Flu vaccine need        Relevant Orders    Flu Vaccine QUAD 36+ mos IM       Follow up plan: Return for complete physical at [place] in a few months.  An after-visit summary was printed and given to the patient at [facility].  Please see the patient instructions which may contain other information and recommendations beyond what is mentioned above in the assessment and plan.  Face-to-face time with patient was more than 25 minutes, >50% time spent counseling and coordination of care  ------------------ Charting for flu shot not complete, so I hit "defer" and can have staff finish documentation after signing note

## 2015-08-04 ENCOUNTER — Telehealth: Payer: Self-pay | Admitting: Family Medicine

## 2015-08-04 NOTE — Telephone Encounter (Signed)
Pt called back and stated that the number to Dr Darion's office is [phone].

## 2015-08-04 NOTE — Telephone Encounter (Signed)
Patient called stating that she needs Dr. Sanda Klein to call Dr. Ashok Cordia and and tell him that she approved her psychological  matter and because his office cancel her surgery date due to that matter.

## 2015-08-04 NOTE — Telephone Encounter (Signed)
I called Dr. Leretha Dykes office, they are closed; [phone] is the on-call number, but I don't need to reach anyone urgently; they asked to call back during normal business hours, could not leave a message I will try again either after lunch or next week I added him to Care Team ------------------------ I called patient; told her no worries; I am fine with [place] moving forward to get her surgery done and will Dr. Velna Ochs know I am in favor of going ahead with her planned surgery; she thanked me

## 2015-08-04 NOTE — Telephone Encounter (Signed)
Routing to Dr. Lada 

## 2015-08-05 NOTE — Telephone Encounter (Signed)
I told patient that I would reach out to Dr. Velna Ochs on Monday, but forgot that I'll be out of the office until Wednesday [facility] staff: Please call his office on Monday Feb 27th Let them know I saw patient and believe she is okay from a mental status standpoint to proceed with surgery

## 2015-08-07 NOTE — Telephone Encounter (Signed)
I spoke with Vaughan Basta, Dr. Leretha Dykes nurse and notified her of the message below.

## 2015-08-07 NOTE — Telephone Encounter (Signed)
This note was also faxed to their office at [phone] per Linda's request.

## 2015-08-09 ENCOUNTER — Telehealth: Payer: Self-pay | Admitting: Family Medicine

## 2015-08-09 ENCOUNTER — Telehealth: Payer: Self-pay

## 2015-08-09 DIAGNOSIS — M47817 Spondylosis without myelopathy or radiculopathy, lumbosacral region: Secondary | ICD-10-CM

## 2015-08-09 NOTE — Assessment & Plan Note (Signed)
Refer to local pain clinic, per patient request

## 2015-08-09 NOTE — Telephone Encounter (Signed)
Referral entered; thank you 

## 2015-08-09 NOTE — Telephone Encounter (Signed)
She is currently going to a pain clinic in [place] but would like to switch to [facility] pain clinic so she doesn't have to drive so far. It is for her low back pain.

## 2015-08-09 NOTE — Telephone Encounter (Signed)
I received note from spine surgeon, Dr. Velna Ochs I called patient to discuss, offer assistance in helping her get in to see another surgeon and start work-up recommended in his note I told her I'm on her side, and will be here to help her get in for surgery as soon as possible

## 2015-08-09 NOTE — Assessment & Plan Note (Signed)
New referral to [facility] spine surgeon

## 2015-08-10 NOTE — Telephone Encounter (Signed)
Pt called stated she would like a referral to [facility]. Pt stated she also has a Psychologist, sport and exercise at [facility] that is going to see her next Wednesday. Thanks.

## 2015-08-10 NOTE — Telephone Encounter (Signed)
Patient notified, appt scheduled.

## 2015-08-10 NOTE — Telephone Encounter (Signed)
Please call patient; let her know that I put in the referral to [place]; see if there is anything else we can do to help her get her surgery arranged I'd like to see her back in the office in 10-14 days for follow-up on where she is, how things are going

## 2015-08-15 ENCOUNTER — Telehealth: Payer: Self-pay | Admitting: Family Medicine

## 2015-08-15 NOTE — Assessment & Plan Note (Signed)
After talking with patient, I do not believe she was serious about wanting to hurt herself; I believe she was impulsive and upset; she verbalizes that she will not hurt herself; continue cymbalta at current dose; I do not believe she has any current psychiatric reason to postpone or defer her surgery

## 2015-08-15 NOTE — Assessment & Plan Note (Signed)
Upcoming surgery planned with Dr. Velna Ochs

## 2015-08-15 NOTE — Telephone Encounter (Signed)
Pt called stated that she was referred to pain management @ [facility] but the waiting list is long, wants to know if she can get a referral to [facility] in [place]. Thanks.

## 2015-08-15 NOTE — Assessment & Plan Note (Signed)
Plans to have surgery with Dr. Velna Ochs soon

## 2015-08-16 NOTE — Telephone Encounter (Signed)
Referral, office notes, imaging, faxed to [facility] in [place]. Marked High Priority. They will call and scheduled her appointment soon!

## 2015-08-21 ENCOUNTER — Encounter: Payer: Self-pay | Admitting: Family Medicine

## 2015-08-21 ENCOUNTER — Ambulatory Visit: Payer: Self-pay | Admitting: Family Medicine

## 2015-09-09 HISTORY — PX: SPINE SURGERY: SHX786

## 2015-10-03 ENCOUNTER — Ambulatory Visit: Payer: PRIVATE HEALTH INSURANCE | Attending: Family Medicine

## 2015-12-18 ENCOUNTER — Other Ambulatory Visit: Payer: Self-pay

## 2015-12-18 MED ORDER — DULOXETINE HCL 30 MG PO CPEP: 90.0000 mg | ORAL_CAPSULE | Freq: Every day | ORAL | Status: DC

## 2016-05-08 ENCOUNTER — Encounter: Payer: Self-pay | Admitting: Family Medicine

## 2016-05-29 ENCOUNTER — Other Ambulatory Visit: Payer: Self-pay | Admitting: Family Medicine

## 2016-06-11 NOTE — Telephone Encounter (Signed)
Glitch in computer system; I was not getting refill requests; addressing today ---------------------------------------------- Please ask pt to schedule a f/u appointment in the next month please; last visit was actually Feb 2017 at [place] approved Thank you

## 2016-06-12 NOTE — Telephone Encounter (Signed)
Left voice message for patient to return call. Did not leave detailed message.

## 2016-07-15 ENCOUNTER — Other Ambulatory Visit: Payer: Self-pay

## 2016-07-15 ENCOUNTER — Other Ambulatory Visit: Payer: Self-pay | Admitting: Family Medicine

## 2016-07-15 DIAGNOSIS — Z1231 Encounter for screening mammogram for malignant neoplasm of breast: Secondary | ICD-10-CM

## 2016-07-15 MED ORDER — DULOXETINE HCL 30 MG PO CPEP: 90.0000 mg | ORAL_CAPSULE | Freq: Every day | ORAL | 0 refills | Status: DC

## 2016-07-15 NOTE — Telephone Encounter (Signed)
Please ask patient to schedule a visit in the next month; her last visit with me was February 2017; I'll refill her medicine and we look forward to seeing her soon

## 2016-07-16 NOTE — Telephone Encounter (Signed)
LMOM to inform pt to call back and schedule an appt

## 2016-07-17 ENCOUNTER — Other Ambulatory Visit: Payer: Self-pay

## 2016-07-22 ENCOUNTER — Ambulatory Visit (INDEPENDENT_AMBULATORY_CARE_PROVIDER_SITE_OTHER): Payer: Managed Care, Other (non HMO) | Admitting: Family Medicine

## 2016-07-22 ENCOUNTER — Encounter: Payer: Self-pay | Admitting: Family Medicine

## 2016-07-22 VITALS — BP 126/74 | HR 91 | Temp 99.3°F | Resp 18 | Wt 182.4 lb

## 2016-07-22 DIAGNOSIS — G8929 Other chronic pain: Secondary | ICD-10-CM

## 2016-07-22 DIAGNOSIS — M545 Low back pain, unspecified: Secondary | ICD-10-CM

## 2016-07-22 DIAGNOSIS — M47817 Spondylosis without myelopathy or radiculopathy, lumbosacral region: Secondary | ICD-10-CM

## 2016-07-22 DIAGNOSIS — F4321 Adjustment disorder with depressed mood: Secondary | ICD-10-CM

## 2016-07-22 DIAGNOSIS — Z23 Encounter for immunization: Secondary | ICD-10-CM

## 2016-07-22 DIAGNOSIS — R0789 Other chest pain: Secondary | ICD-10-CM | POA: Diagnosis not present

## 2016-07-22 DIAGNOSIS — M48061 Spinal stenosis, lumbar region without neurogenic claudication: Secondary | ICD-10-CM | POA: Diagnosis not present

## 2016-07-22 DIAGNOSIS — M5442 Lumbago with sciatica, left side: Secondary | ICD-10-CM

## 2016-07-22 HISTORY — DX: Other chronic pain: G89.29

## 2016-07-22 HISTORY — DX: Low back pain, unspecified: M54.50

## 2016-07-22 MED ORDER — DULOXETINE HCL 30 MG PO CPEP: 90.0000 mg | ORAL_CAPSULE | Freq: Every day | ORAL | 5 refills | Status: DC

## 2016-07-22 NOTE — Patient Instructions (Addendum)
I do not think further corticosteroid treatment is a good idea for you at this time Please tell your pain management doctor that I recommend other modalities of treatment other than steroid injections Call me in a week or so with an update on your treatment plan  You received the flu shot today; it should protect you against the flu virus over the coming months; it will take about two weeks for antibodies to develop; do try to stay away from hospitals, nursing homes, and daycares during peak flu season; taking extra vitamin C daily during flu season may help you avoid getting sick   12 Ways to [place]  ?Anxiety is normal human sensation. It is what helped our ancestors survive the pitfalls of the wilderness. Anxiety is defined as experiencing worry or nervousness about an imminent event or something with an uncertain outcome. It is a feeling experienced by most people at some point in their lives. Anxiety can be triggered by a very personal issue, such as the illness of a loved one, or an event of global proportions, such as a refugee crisis. Some of the symptoms of anxiety are:  Feeling restless.  Having a feeling of impending danger.  Increased heart rate.  Rapid breathing. Sweating.  Shaking.  Weakness or feeling tired.  Difficulty concentrating on anything except the current worry.  Insomnia.  Stomach or bowel problems. What can we do about anxiety we may be feeling? There are many techniques to help manage stress and relax. Here are 12 ways you can reduce your anxiety almost immediately: 1. Turn off the constant feed of information. Take a social media sabbatical. Studies have shown that social media directly contributes to social anxiety.  2. Monitor your television viewing habits. Are you watching shows that are also contributing to your anxiety, such as 24-hour news stations? Try watching something else, or better yet, nothing at all. Instead, listen to music, read an inspirational  book or practice a hobby. 3. Eat nutritious meals. Also, don't skip meals and keep healthful snacks on hand. Hunger and poor diet contributes to feeling anxious. 4. Sleep. Sleeping on a regular schedule for at least seven to eight hours a night will do wonders for your outlook when you are awake. 5. Exercise. Regular exercise will help rid your body of that anxious energy and help you get more restful sleep. 6. Try deep (diaphragmatic) breathing. Inhale slowly through your nose for five seconds and exhale through your mouth. 7. Practice acceptance and gratitude. When anxiety hits, accept that there are things out of your control that shouldn't be of immediate concern.  8. Seek out humor. When anxiety strikes, watch a funny video, read jokes or call a friend who makes you laugh. Laughter is healing for our bodies and releases endorphins that are calming. 9. Stay positive. Take the effort to replace negative thoughts with positive ones. Try to see a stressful situation in a positive light. Try to come up with solutions rather than dwelling on the problem. 10. Figure out what triggers your anxiety. Keep a journal and make note of anxious moments and the events surrounding them. This will help you identify triggers you can avoid or even eliminate. 11. Talk to someone. Let a trusted friend, family member or even trained professional know that you are feeling overwhelmed and anxious. Verbalize what you are feeling and why.  12. Volunteer. If your anxiety is triggered by a crisis on a large scale, become an advocate and work to  resolve the problem that is causing you unease. Anxiety is often unwelcome and can become overwhelming. If not kept in check, it can become a disorder that could require medical treatment. However, if you take the time to care for yourself and avoid the triggers that make you anxious, you will be able to find moments of relaxation and clarity that make your life much more enjoyable.

## 2016-07-22 NOTE — Assessment & Plan Note (Signed)
With DDD lumbar spine; I advise no further steroids at this time; also cautioned never ever take benzo with narcotics, citing FDA press release Feb 08, 2015

## 2016-07-22 NOTE — Progress Notes (Signed)
BP 126/74   Pulse 91   Temp 99.3 F (37.4 C) (Oral)   Resp 18   Wt 182 lb 6.4 oz (82.7 kg)   SpO2 97%   BMI 29.66 kg/m    Subjective:    Patient ID: [name], female    DOB: 1971/04/27, 46 y.o.   MRN: [id number]  HPI: [name] is a 46 y.o. female  Chief Complaint  Patient presents with  . Medication Refill  . Follow-up   She had a second surgery Got steroid shots 4 weeks ago, they are wearing off now after 4 weeks; she has had 10-15 shots in the past and these are the first that have really worked Mind wandering, up at night; thinks it might be the steroid side effects She said there is mental illness in the family and she is going to talk to him about how the steroids made her feel versus the benefit of the shots; she sees him tomorrow She has been having stomach upset; no blood in the stools Took some 500 magnesium to help with stooling Problems with sleep She asked the nurse about lorazepam; "my whole family has been on that for years" because of "depression and mental problems"; they have all been on that; her mother suggested she talked to her about No visual or aural hallucinations; feeling like she is losing my mind; climbing the walls, eating more at 4 o'clock in the morning; she is concerned with the steroids and how it's affecting her mentally At the end of the visit, she says she has been anxious and had chest tightness last night; EKG obtained  Depression screen [facility] 2/9 07/22/2016 08/02/2015 04/03/2015 03/10/2015  Decreased Interest 3 1 1 1   Down, Depressed, Hopeless 1 1 1 2   PHQ - 2 Score 4 2 2 3   Altered sleeping 3 2 1 2   Tired, decreased energy 3 1 1 2   Change in appetite 1 0 1 2  Feeling bad or failure about yourself  3 1 1 2   Trouble concentrating 3 0 0 0  Moving slowly or fidgety/restless 0 0 0 0  Suicidal thoughts 0 1 0 0  PHQ-9 Score 17 7 6 11   Difficult doing work/chores Very difficult Somewhat difficult Not difficult at all Somewhat difficult    MD notes: no thoughts of self-harm or hurting others   GAD 7 : Generalized Anxiety Score 08/02/2015  Nervous, Anxious, on Edge 0  Control/stop worrying 2  Worry too much - different things 0  Trouble relaxing 3  Restless 2  Easily annoyed or irritable 0  Afraid - awful might happen 2  Total GAD 7 Score 9  Anxiety Difficulty Not difficult at all   Relevant past medical, surgical, family and social history reviewed Past Medical History:  Diagnosis Date  . Allergy   . Anxiety   . Chronic bilateral low back pain 07/22/2016   Started in her 20's; managed by pain clinic  . Depression    Past Surgical History:  Procedure Laterality Date  . ABDOMINAL HYSTERECTOMY    . CESAREAN SECTION    . SPINE SURGERY    . SPINE SURGERY  09/09/2015  . TONSILLECTOMY     Family History  Problem Relation Age of Onset  . Hypertension Mother   . Diabetes Mother   . Hypertension Father   . Hyperlipidemia Father   . Arthritis Father   . Lung disease Father   . Heart disease Father   .  Cancer Maternal Aunt     breast  . Depression Sister   . Heart disease Maternal Grandfather   . Depression Sister   . Stroke Neg Hx    Social History  Substance Use Topics  . Smoking status: Current Every Day Smoker    Packs/day: 1.00    Years: 20.00    Types: Cigarettes  . Smokeless tobacco: Never Used  . Alcohol use No   Interim medical history since last visit reviewed. Allergies and medications reviewed  Review of Systems Per HPI unless specifically indicated above     Objective:    BP 126/74   Pulse 91   Temp 99.3 F (37.4 C) (Oral)   Resp 18   Wt 182 lb 6.4 oz (82.7 kg)   SpO2 97%   BMI 29.66 kg/m   Wt Readings from Last 3 Encounters:  07/22/16 182 lb 6.4 oz (82.7 kg)  08/02/15 174 lb 12.8 oz (79.3 kg)  04/03/15 171 lb (77.6 kg)    Physical Exam  Constitutional: She appears well-developed and well-nourished. No distress.  Neck: No JVD present. No thyromegaly present.   Cardiovascular: Normal rate and regular rhythm.   Pulmonary/Chest: Effort normal and breath sounds normal. She exhibits tenderness (mild tenderness with palpation over upper chest wall left side).  Abdominal: Normal appearance. She exhibits no distension.  Psychiatric: Judgment normal. Her mood appears not anxious. Her affect is not blunt and not labile. Her speech is not rapid and/or pressured, not delayed and not slurred. She is not hyperactive, not withdrawn and not actively hallucinating. Thought content is not paranoid and not delusional. Cognition and memory are not impaired. She does not exhibit a depressed mood. She expresses no homicidal and no suicidal ideation. She is attentive.   Results for orders placed or performed in visit on 04/03/15  UA/M w/rflx Culture, Routine  Result Value Ref Range   Specific Gravity, UA 1.015 1.005 - 1.030   pH, UA 8.5 (H) 5.0 - 7.5   Color, UA Yellow Yellow   Appearance Ur Cloudy (A) Clear   Leukocytes, UA Negative Negative   Protein, UA Negative Negative/Trace   Glucose, UA Negative Negative   Ketones, UA Negative Negative   RBC, UA Negative Negative   Bilirubin, UA Negative Negative   Urobilinogen, Ur 0.2 0.2 - 1.0 mg/dL   Nitrite, UA Negative Negative      Assessment & Plan:   Problem List Items Addressed This Visit      Musculoskeletal and Integument   Lumbosacral spondylosis without myelopathy    She continues to see specialist for her chronic back pain      Relevant Medications   EMBEDA 50-2 MG CPCR   oxyCODONE-acetaminophen (PERCOCET) 7.5-325 MG tablet     Other   Spinal stenosis of lumbar region    With DDD lumbar spine; I advise no further steroids at this time; also cautioned never ever take benzo with narcotics, citing FDA press release Feb 08, 2015      Chronic bilateral low back pain - Primary    Discussed role of psychiatry and CBT for pain control, as an adjunct to her medicine; she is also reacting I believe to the  steroid injection; advised no steroids right now, talk to pain doctor about her symptoms; continue cymbalta; refer to psych      Relevant Medications   EMBEDA 50-2 MG CPCR   oxyCODONE-acetaminophen (PERCOCET) 7.5-325 MG tablet   Other Relevant Orders   Ambulatory referral to Psychiatry  Chest tightness    Likely related to stress; normal EKG today, NSR; believe this is related to her prednisone treatment and anxiety; will refer to cardiologist to determine if stress testing needed; patient was instructed to call 911, seek medical care if chest pain recurs; she is symptom-free at time of visit      Adjustment disorder with depressed mood    With reaction to steroid; do not get any further steroids at this time; share with pain doctor; refer to psych      Relevant Orders   Ambulatory referral to Psychiatry    Other Visit Diagnoses    Needs flu shot       Relevant Orders   Flu Vaccine QUAD 36+ mos PF IM (Fluarix & Fluzone Quad PF) (Completed)       Follow up plan: No Follow-up on file.  An after-visit summary was printed and given to the patient at [facility].  Please see the patient instructions which may contain other information and recommendations beyond what is mentioned above in the assessment and plan.  Meds ordered this encounter  Medications  . EMBEDA 50-2 MG CPCR    Sig: Take 5 mg by mouth daily.    Refill:  0  . oxyCODONE-acetaminophen (PERCOCET) 7.5-325 MG tablet    Sig: Take 7.5-325 tablets by mouth 4 (four) times daily as needed.    Refill:  0  . DISCONTD: DULoxetine (CYMBALTA) 30 MG capsule    Sig: Take 3 capsules (90 mg total) by mouth daily.    Dispense:  90 capsule    Refill:  5  . DULoxetine (CYMBALTA) 30 MG capsule    Sig: Take 3 capsules (90 mg total) by mouth daily.    Dispense:  90 capsule    Refill:  5    Orders Placed This Encounter  Procedures  . Flu Vaccine QUAD 36+ mos PF IM (Fluarix & Fluzone Quad PF)  . Ambulatory referral to Psychiatry

## 2016-07-22 NOTE — Assessment & Plan Note (Signed)
Discussed role of psychiatry and CBT for pain control, as an adjunct to her medicine; she is also reacting I believe to the steroid injection; advised no steroids right now, talk to pain doctor about her symptoms; continue cymbalta; refer to psych

## 2016-07-22 NOTE — Assessment & Plan Note (Signed)
With reaction to steroid; do not get any further steroids at this time; share with pain doctor; refer to psych

## 2016-07-24 ENCOUNTER — Encounter: Payer: Self-pay | Admitting: Family Medicine

## 2016-07-28 DIAGNOSIS — R0789 Other chest pain: Secondary | ICD-10-CM | POA: Insufficient documentation

## 2016-07-28 NOTE — Assessment & Plan Note (Signed)
She continues to see specialist for her chronic back pain

## 2016-07-28 NOTE — Assessment & Plan Note (Signed)
Likely related to stress; normal EKG today, NSR; believe this is related to her prednisone treatment and anxiety; will refer to cardiologist to determine if stress testing needed; patient was instructed to call 911, seek medical care if chest pain recurs; she is symptom-free at time of visit

## 2016-08-01 ENCOUNTER — Encounter: Payer: Self-pay | Admitting: Family Medicine

## 2016-08-08 ENCOUNTER — Encounter: Payer: Managed Care, Other (non HMO) | Admitting: Family Medicine

## 2016-08-08 ENCOUNTER — Encounter: Payer: Self-pay | Admitting: *Deleted

## 2016-08-26 ENCOUNTER — Telehealth: Payer: Self-pay | Admitting: Family Medicine

## 2016-08-26 DIAGNOSIS — G8929 Other chronic pain: Secondary | ICD-10-CM

## 2016-08-26 NOTE — Telephone Encounter (Signed)
Yes, thank you so much for entering; I've co-signed

## 2016-08-26 NOTE — Telephone Encounter (Signed)
I this ok? Referral already entered.

## 2016-08-26 NOTE — Telephone Encounter (Signed)
Pt is currently seeing a pain management doctor and they no longer take her insurance. Pt would like to know if she could get referral to Dr Mohammed Kindle at [facility] pain management. Pt has [employer].

## 2016-08-27 LAB — HM MAMMOGRAPHY

## 2016-09-02 ENCOUNTER — Ambulatory Visit: Payer: Managed Care, Other (non HMO) | Admitting: Family Medicine

## 2016-09-18 ENCOUNTER — Encounter: Payer: Self-pay | Admitting: Physician Assistant

## 2016-09-18 ENCOUNTER — Ambulatory Visit: Payer: Self-pay | Admitting: Physician Assistant

## 2016-09-18 VITALS — BP 101/70 | HR 79 | Temp 97.7°F | Ht 66.0 in | Wt 185.0 lb

## 2016-09-18 DIAGNOSIS — Z0189 Encounter for other specified special examinations: Secondary | ICD-10-CM

## 2016-09-18 DIAGNOSIS — R3989 Other symptoms and signs involving the genitourinary system: Secondary | ICD-10-CM

## 2016-09-18 DIAGNOSIS — Z008 Encounter for other general examination: Secondary | ICD-10-CM

## 2016-09-18 DIAGNOSIS — Z0000 Encounter for general adult medical examination without abnormal findings: Secondary | ICD-10-CM

## 2016-09-18 LAB — POCT URINALYSIS DIPSTICK
Bilirubin, UA: NEGATIVE
Blood, UA: NEGATIVE
Glucose, UA: NEGATIVE
Ketones, UA: NEGATIVE
LEUKOCYTES UA: NEGATIVE — AB
Nitrite, UA: NEGATIVE
PH UA: 7.5 (ref 5.0–8.0)
Spec Grav, UA: 1.02 (ref 1.010–1.025)
Urobilinogen, UA: 0.2 E.U./dL

## 2016-09-18 NOTE — Progress Notes (Signed)
S: pt here for wellness physical and biometrics for insurance purposes, some bladder pain on and off, no fever/chills/v/d;  ros neg. PMH:   As noted on chart Social: as noted on chart Fam: as noted on chart  O: vitals wnl, nad, ENT wnl, neck supple no lymph, lungs c t a, cv rrr, abd soft nontender bs normal all 4 quads, ua wnl  A: wellness, biometric physical  P: labs for exec panel, vit d, ua; forward results to dr lada

## 2016-09-19 LAB — CMP12+LP+TP+TSH+6AC+CBC/D/PLT
ALBUMIN: 4.1 g/dL (ref 3.5–5.5)
ALK PHOS: 50 IU/L (ref 39–117)
ALT: 17 IU/L (ref 0–32)
AST: 13 IU/L (ref 0–40)
Albumin/Globulin Ratio: 1.8 (ref 1.2–2.2)
BILIRUBIN TOTAL: 0.4 mg/dL (ref 0.0–1.2)
BUN/Creatinine Ratio: 35 — ABNORMAL HIGH (ref 9–23)
BUN: 25 mg/dL — ABNORMAL HIGH (ref 6–24)
Basophils Absolute: 0 10*3/uL (ref 0.0–0.2)
Basos: 0 %
CHOLESTEROL TOTAL: 207 mg/dL — AB (ref 100–199)
Calcium: 9.2 mg/dL (ref 8.7–10.2)
Chloride: 102 mmol/L (ref 96–106)
Chol/HDL Ratio: 2.7 ratio (ref 0.0–4.4)
Creatinine, Ser: 0.71 mg/dL (ref 0.57–1.00)
EOS (ABSOLUTE): 0.2 10*3/uL (ref 0.0–0.4)
Eos: 1 %
Estimated CHD Risk: <0.5 times avg. (ref 0.0–1.0)
FREE THYROXINE INDEX: 2.3 (ref 1.2–4.9)
GFR calc Af Amer: 117 mL/min/{1.73_m2} (ref >59)
GFR calc non Af Amer: 102 mL/min/{1.73_m2} (ref >59)
GGT: 16 IU/L (ref 0–60)
GLOBULIN, TOTAL: 2.3 g/dL (ref 1.5–4.5)
Glucose: 93 mg/dL (ref 65–99)
HDL: 78 mg/dL (ref >39)
HEMATOCRIT: 40.7 % (ref 34.0–46.6)
HEMOGLOBIN: 13.7 g/dL (ref 11.1–15.9)
IMMATURE GRANULOCYTES: 0 %
Immature Grans (Abs): 0 10*3/uL (ref 0.0–0.1)
Iron: 58 ug/dL (ref 27–159)
LDH: 175 IU/L (ref 119–226)
LDL CALC: 117 mg/dL — AB (ref 0–99)
LYMPHS ABS: 4.5 10*3/uL — AB (ref 0.7–3.1)
Lymphs: 39 %
MCH: 29.7 pg (ref 26.6–33.0)
MCHC: 33.7 g/dL (ref 31.5–35.7)
MCV: 88 fL (ref 79–97)
MONOS ABS: 0.7 10*3/uL (ref 0.1–0.9)
Monocytes: 6 %
NEUTROS PCT: 54 %
Neutrophils Absolute: 6 10*3/uL (ref 1.4–7.0)
Phosphorus: 3 mg/dL (ref 2.5–4.5)
Platelets: 268 10*3/uL (ref 150–379)
Potassium: 4.2 mmol/L (ref 3.5–5.2)
RBC: 4.62 x10E6/uL (ref 3.77–5.28)
RDW: 15.1 % (ref 12.3–15.4)
Sodium: 141 mmol/L (ref 134–144)
T3 Uptake Ratio: 27 % (ref 24–39)
T4, Total: 8.6 ug/dL (ref 4.5–12.0)
TRIGLYCERIDES: 62 mg/dL (ref 0–149)
TSH: 1.22 u[IU]/mL (ref 0.450–4.500)
Total Protein: 6.4 g/dL (ref 6.0–8.5)
Uric Acid: 2.5 mg/dL (ref 2.5–7.1)
VLDL CHOLESTEROL CAL: 12 mg/dL (ref 5–40)
WBC: 11.3 10*3/uL — AB (ref 3.4–10.8)

## 2016-09-19 LAB — VITAMIN D 25 HYDROXY (VIT D DEFICIENCY, FRACTURES): Vit D, 25-Hydroxy: 27.1 ng/mL — ABNORMAL LOW (ref 30.0–100.0)

## 2016-10-02 ENCOUNTER — Encounter: Payer: Self-pay | Admitting: Family Medicine

## 2016-10-02 ENCOUNTER — Ambulatory Visit (INDEPENDENT_AMBULATORY_CARE_PROVIDER_SITE_OTHER): Payer: Managed Care, Other (non HMO) | Admitting: Family Medicine

## 2016-10-02 DIAGNOSIS — F1190 Opioid use, unspecified, uncomplicated: Secondary | ICD-10-CM | POA: Diagnosis not present

## 2016-10-02 DIAGNOSIS — M2550 Pain in unspecified joint: Secondary | ICD-10-CM | POA: Diagnosis not present

## 2016-10-02 DIAGNOSIS — M47817 Spondylosis without myelopathy or radiculopathy, lumbosacral region: Secondary | ICD-10-CM | POA: Diagnosis not present

## 2016-10-02 DIAGNOSIS — M48061 Spinal stenosis, lumbar region without neurogenic claudication: Secondary | ICD-10-CM

## 2016-10-02 LAB — CBC WITH DIFFERENTIAL/PLATELET
BASOS ABS: 0 {cells}/uL (ref 0–200)
Basophils Relative: 0 %
EOS PCT: 2 %
Eosinophils Absolute: 218 cells/uL (ref 15–500)
HCT: 40.8 % (ref 35.0–45.0)
Hemoglobin: 13.3 g/dL (ref 11.7–15.5)
Lymphocytes Relative: 43 %
Lymphs Abs: 4687 cells/uL — ABNORMAL HIGH (ref 850–3900)
MCH: 29.6 pg (ref 27.0–33.0)
MCHC: 32.6 g/dL (ref 32.0–36.0)
MCV: 90.9 fL (ref 80.0–100.0)
MPV: 9.2 fL (ref 7.5–12.5)
Monocytes Absolute: 545 cells/uL (ref 200–950)
Monocytes Relative: 5 %
NEUTROS ABS: 5450 {cells}/uL (ref 1500–7800)
Neutrophils Relative %: 50 %
Platelets: 287 10*3/uL (ref 140–400)
RBC: 4.49 MIL/uL (ref 3.80–5.10)
RDW: 14.3 % (ref 11.0–15.0)
WBC: 10.9 10*3/uL — AB (ref 3.8–10.8)

## 2016-10-02 NOTE — Progress Notes (Signed)
BP 134/90   Pulse 90   Temp 98.3 F (36.8 C) (Oral)   Resp 16   Wt 186 lb 14.4 oz (84.8 kg)   SpO2 96%   BMI 30.17 kg/m    Subjective:    Patient ID: [name], female    DOB: December 09, 1970, 46 y.o.   MRN: [id number]  HPI: [name] is a 46 y.o. female  Chief Complaint  Patient presents with  . Pain clinic    wants something closer and cheaper to exspensive    HPI She has chronic pain, lower back; going to [facility] in [place]; they did the injections; has steel rods and a plastic disc in there (lower back) She would like to see a pain clinic closer to home; paying $300 each time she goes; should be $60 speciality co-pay her insurance company said She is going to [facility] for her current pain management and would like to be closer They were talking about doing a nerve block; not a candidiate after the first surgery; she gets some xrays done soon On cymbalta as an adjunct to pain Also on meloxicam as an adjunct  She doesn't know if arthritis, but she wonders if she shouldn't see a rheumatologist or arthritis doctor; has knots in her fingers; her father had rheumatoid arthritis and his hands were crippled; no real pain in the shoulders, eblows, or wrists; does have aching in her knees, back trumps everything; knots on the fingers for years, just DIPs  Vitamin D deficiency; level was 27.1 on September 18, 2016; had labs done as part of routine physical labs  Depression screen [facility] 2/9 10/02/2016 07/22/2016 08/02/2015 04/03/2015 03/10/2015  Decreased Interest 0 3 1 1 1   Down, Depressed, Hopeless 0 1 1 1 2   PHQ - 2 Score 0 4 2 2 3   Altered sleeping - 3 2 1 2   Tired, decreased energy - 3 1 1 2   Change in appetite - 1 0 1 2  Feeling bad or failure about yourself  - 3 1 1 2   Trouble concentrating - 3 0 0 0  Moving slowly or fidgety/restless - 0 0 0 0  Suicidal thoughts - 0 1 0 0  PHQ-9 Score - 17 7 6 11   Difficult doing work/chores - Very difficult Somewhat  difficult Not difficult at all Somewhat difficult   Relevant past medical, surgical, family and social history reviewed Past Medical History:  Diagnosis Date  . Allergy   . Anxiety   . Chronic bilateral low back pain 07/22/2016   Started in her 20's; managed by pain clinic  . Depression    Past Surgical History:  Procedure Laterality Date  . ABDOMINAL HYSTERECTOMY    . CESAREAN SECTION    . SPINE SURGERY    . SPINE SURGERY  09/09/2015  . TONSILLECTOMY     Family History  Problem Relation Age of Onset  . Hypertension Mother   . Diabetes Mother   . Hypertension Father   . Hyperlipidemia Father   . Arthritis Father   . Lung disease Father   . Heart disease Father   . Cancer Maternal Aunt     breast  . Depression Sister   . Heart disease Maternal Grandfather   . Depression Sister   . Stroke Neg Hx    Social History  Substance Use Topics  . Smoking status: Current Every Day Smoker    Packs/day: 1.00    Years: 20.00    Types:  Cigarettes  . Smokeless tobacco: Never Used  . Alcohol use No   Interim medical history since last visit reviewed. Allergies and medications reviewed  Review of Systems Per HPI unless specifically indicated above     Objective:    BP 134/90   Pulse 90   Temp 98.3 F (36.8 C) (Oral)   Resp 16   Wt 186 lb 14.4 oz (84.8 kg)   SpO2 96%   BMI 30.17 kg/m   Wt Readings from Last 3 Encounters:  10/02/16 186 lb 14.4 oz (84.8 kg)  09/18/16 185 lb (83.9 kg)  07/22/16 182 lb 6.4 oz (82.7 kg)    Physical Exam  Constitutional: She appears well-developed and well-nourished. No distress.  Neck: No JVD present. No thyromegaly present.  Cardiovascular: Normal rate and regular rhythm.   Pulmonary/Chest: Effort normal and breath sounds normal.  Abdominal: Normal appearance. She exhibits no distension.  Musculoskeletal:       Right hand: She exhibits decreased range of motion, tenderness and bony tenderness.       Left hand: She exhibits decreased  range of motion, tenderness and bony tenderness.  Heberden's nodules on a few of the fingers; no ulnar deviation of the fingers; some mild swelling of the MCPs; no visible swelling at the base of the thumbs  Psychiatric: Judgment normal. Her mood appears not anxious. Her speech is not rapid and/or pressured, not delayed and not slurred. She is not withdrawn. Cognition and memory are not impaired. She does not exhibit a depressed mood. She expresses no homicidal and no suicidal ideation.  Polite, good eye contact with examiner She is attentive.      Assessment & Plan:   Problem List Items Addressed This Visit      Musculoskeletal and Integument   Lumbosacral spondylosis without myelopathy    Refer to pain clinic      Relevant Orders   Ambulatory referral to Pain Clinic     Other   Spinal stenosis of lumbar region    Refer to pain clinic, here in [place] per patient request      Relevant Orders   Ambulatory referral to Pain Clinic   Chronic, continuous use of opioids    Managed by the pain clinic; she wishes to switch pain clinics, and I'm glad to facilitate that; I did explain that I will not serve as a prescribing bridge between the two, so she'll need to continue to see her current pain clinic and make arrangements for medicine as she transitions between the two clinics; she verbalized understanding      Arthralgia of multiple joints    Does appears to have heberdens and bouchards, but also tender over the MCPS; positive fam hx of RA; check labs      Relevant Orders   ANA,IFA RA Diag Pnl w/rflx Tit/Patn (Completed)   C-reactive protein (Completed)   CBC with Differential/Platelet (Completed)       Follow up plan: No Follow-up on file.  An after-visit summary was printed and given to the patient at [facility].  Please see the patient instructions which may contain other information and recommendations beyond what is mentioned above in the assessment and plan.  No  orders of the defined types were placed in this encounter.   Orders Placed This Encounter  Procedures  . ANA,IFA RA Diag Pnl w/rflx Tit/Patn  . C-reactive protein  . CBC with Differential/Platelet  . Ambulatory referral to Pain Clinic

## 2016-10-02 NOTE — Assessment & Plan Note (Signed)
Refer to pain clinic? 

## 2016-10-02 NOTE — Patient Instructions (Signed)
Do continue to see your pain clinic doctor until you make the transition to the new clinic; I won't be able to provide you with any prescriptions for narcotics We'll get labs today Continue your other medicines for now Try turmeric as a natural anti-inflammatory (for pain and arthritis). It comes in capsules where you buy aspirin and fish oil, but also as a spice where you buy pepper and garlic powder.

## 2016-10-02 NOTE — Assessment & Plan Note (Signed)
Refer to pain clinic, here in [place] per patient request

## 2016-10-02 NOTE — Assessment & Plan Note (Signed)
Does appears to have heberdens and bouchards, but also tender over the MCPS; positive fam hx of RA; check labs

## 2016-10-03 DIAGNOSIS — F1190 Opioid use, unspecified, uncomplicated: Secondary | ICD-10-CM | POA: Insufficient documentation

## 2016-10-03 LAB — C-REACTIVE PROTEIN: CRP: 5.2 mg/L (ref <8.0)

## 2016-10-03 LAB — ANA,IFA RA DIAG PNL W/RFLX TIT/PATN
ANA: NEGATIVE
CYCLIC CITRULLIN PEPTIDE AB: 16 U
Rhuematoid fact SerPl-aCnc: 23 IU/mL — ABNORMAL HIGH (ref <14)

## 2016-10-03 NOTE — Assessment & Plan Note (Signed)
Managed by the pain clinic; she wishes to switch pain clinics, and I'm glad to facilitate that; I did explain that I will not serve as a prescribing bridge between the two, so she'll need to continue to see her current pain clinic and make arrangements for medicine as she transitions between the two clinics; she verbalized understanding

## 2016-10-09 ENCOUNTER — Other Ambulatory Visit: Payer: Self-pay

## 2016-10-09 DIAGNOSIS — M0580 Other rheumatoid arthritis with rheumatoid factor of unspecified site: Secondary | ICD-10-CM

## 2016-10-09 DIAGNOSIS — R768 Other specified abnormal immunological findings in serum: Secondary | ICD-10-CM

## 2016-10-22 DIAGNOSIS — R768 Other specified abnormal immunological findings in serum: Secondary | ICD-10-CM | POA: Insufficient documentation

## 2016-12-05 ENCOUNTER — Encounter (INDEPENDENT_AMBULATORY_CARE_PROVIDER_SITE_OTHER): Payer: Self-pay

## 2016-12-05 ENCOUNTER — Ambulatory Visit
Admission: RE | Admit: 2016-12-05 | Discharge: 2016-12-05 | Disposition: A | Discharge status: Home / Self Care | Payer: Managed Care, Other (non HMO) | Source: Ambulatory Visit | Attending: Pain Medicine | Admitting: Pain Medicine

## 2016-12-05 ENCOUNTER — Encounter: Payer: Self-pay | Admitting: Pain Medicine

## 2016-12-05 ENCOUNTER — Other Ambulatory Visit: Payer: Self-pay | Admitting: Pain Medicine

## 2016-12-05 ENCOUNTER — Ambulatory Visit (HOSPITAL_BASED_OUTPATIENT_CLINIC_OR_DEPARTMENT_OTHER): Payer: Managed Care, Other (non HMO) | Admitting: Pain Medicine

## 2016-12-05 VITALS — BP 116/88 | HR 88 | Temp 98.2°F | Resp 16 | Ht 66.0 in | Wt 185.0 lb

## 2016-12-05 DIAGNOSIS — F1190 Opioid use, unspecified, uncomplicated: Secondary | ICD-10-CM | POA: Diagnosis not present

## 2016-12-05 DIAGNOSIS — M533 Sacrococcygeal disorders, not elsewhere classified: Secondary | ICD-10-CM | POA: Insufficient documentation

## 2016-12-05 DIAGNOSIS — E559 Vitamin D deficiency, unspecified: Secondary | ICD-10-CM | POA: Insufficient documentation

## 2016-12-05 DIAGNOSIS — M5136 Other intervertebral disc degeneration, lumbar region with discogenic back pain only: Secondary | ICD-10-CM

## 2016-12-05 DIAGNOSIS — R768 Other specified abnormal immunological findings in serum: Secondary | ICD-10-CM

## 2016-12-05 DIAGNOSIS — M25552 Pain in left hip: Secondary | ICD-10-CM | POA: Insufficient documentation

## 2016-12-05 DIAGNOSIS — M48061 Spinal stenosis, lumbar region without neurogenic claudication: Secondary | ICD-10-CM

## 2016-12-05 DIAGNOSIS — Z87442 Personal history of urinary calculi: Secondary | ICD-10-CM | POA: Diagnosis not present

## 2016-12-05 DIAGNOSIS — G8929 Other chronic pain: Secondary | ICD-10-CM | POA: Diagnosis not present

## 2016-12-05 DIAGNOSIS — F17210 Nicotine dependence, cigarettes, uncomplicated: Secondary | ICD-10-CM | POA: Insufficient documentation

## 2016-12-05 DIAGNOSIS — M961 Postlaminectomy syndrome, not elsewhere classified: Secondary | ICD-10-CM | POA: Diagnosis not present

## 2016-12-05 DIAGNOSIS — M545 Low back pain: Secondary | ICD-10-CM | POA: Insufficient documentation

## 2016-12-05 DIAGNOSIS — M47817 Spondylosis without myelopathy or radiculopathy, lumbosacral region: Secondary | ICD-10-CM

## 2016-12-05 DIAGNOSIS — Z79891 Long term (current) use of opiate analgesic: Secondary | ICD-10-CM

## 2016-12-05 DIAGNOSIS — M5442 Lumbago with sciatica, left side: Secondary | ICD-10-CM | POA: Diagnosis not present

## 2016-12-05 DIAGNOSIS — R7689 Other specified abnormal immunological findings in serum: Secondary | ICD-10-CM

## 2016-12-05 DIAGNOSIS — G894 Chronic pain syndrome: Secondary | ICD-10-CM

## 2016-12-05 DIAGNOSIS — M5126 Other intervertebral disc displacement, lumbar region: Secondary | ICD-10-CM | POA: Diagnosis not present

## 2016-12-05 DIAGNOSIS — F4321 Adjustment disorder with depressed mood: Secondary | ICD-10-CM | POA: Diagnosis not present

## 2016-12-05 DIAGNOSIS — F419 Anxiety disorder, unspecified: Secondary | ICD-10-CM | POA: Insufficient documentation

## 2016-12-05 NOTE — Progress Notes (Signed)
Patient's Name: [name]  MRN: [id number]  Referring Provider: Arnetha Courser, MD  DOB: 06-09-1971  PCP: Arnetha Courser, MD  DOS: 12/05/2016  Note by: Kathlen Brunswick. Dossie Arbour, MD  Service setting: Ambulatory outpatient  Specialty: Interventional Pain Management  Location: [name] (AMB) Pain Management Facility    Patient type: New Patient   Primary Reason(s) for Visit: Initial Patient Evaluation CC: Back Pain (lower)  HPI  [name] is a 46 y.o. year old, female patient, who comes today to see [place] for the first time for an initial evaluation of her chronic pain. She has Adjustment disorder with depressed mood; Stressful life events affecting family and household; Preventative health care; Vitamin D deficiency; Lumbosacral spondylosis without myelopathy; Lumbar central spinal stenosis (L4-5); Breast cancer screening; Bladder irritation; Chronic low back pain (Location of Primary Source of Pain) (midline); Arthralgia of multiple joints; Chronic, continuous use of opioids; Elevated rheumatoid factor; Chronic pain syndrome; Long term (current) use of opiate analgesic; Long term prescription opiate use; Opiate use (85.5 MME/Day); Chronic hip pain (Location of Secondary source of pain) (Left); Failed back surgical syndrome; Discogenic low back pain; Lumbar discogenic pain syndrome; and Chronic sacroiliac joint pain (Left) on her problem list. Today she comes in for evaluation of her Back Pain (lower)  Pain Assessment: Location: Lower, Mid Back Radiating: stays in the mid lower back (at times, radiates to left hip during past two weeks) Onset: More than a month ago Duration: Chronic pain Quality: Aching, Constant, Radiating Severity: 5  (with medicine )/10 (self-reported pain score)  Note: Reported level is compatible with observation.                   Effect on ADL: pace self Timing: Constant Modifying factors: medicine   Onset and Duration: Gradual Cause of pain: Unknown Severity: Getting worse,  NAS-11 at its worse: 7/10, NAS-11 at its best: 4/10 and NAS-11 now: 7/10 Timing: Morning, Noon, Afternoon, Evening, Night, Not influenced by the time of the day, During activity or exercise, After activity or exercise and After a period of immobility Aggravating Factors: Bending, Climbing, Kneeling, Lifiting, Motion, Prolonged sitting, Prolonged standing, Squatting, Stooping , Twisting, Walking, Walking uphill, Walking downhill and Working Alleviating Factors: Cold packs, Hot packs, Lying down, Medications, Resting, Sitting, Sleeping, Standing and Warm showers or baths Associated Problems: Sadness and Pain that does not allow patient to sleep Quality of Pain: Aching, Agonizing, Deep, Disabling, Distressing, Dreadful, Dull, Exhausting, Fearful, Sharp, Shooting, Sickening, Splitting, Stabbing, Throbbing and Uncomfortable Previous Examinations or Tests: Cutaneous Pain Threshold Testing (CPT), MRI scan, X-rays, Nerve conduction test, Neurological evaluation, Orthoperdic evaluation, Chiropractic evaluation and Psychiatric evaluation Previous Treatments: Chiropractic manipulations, Epidural steroid injections, Facet blocks, Narcotic medications, Physical Therapy, Steroid treatments by mouth, Strengthening exercises and Trigger point injections  The patient comes into the clinics today for the first time for a chronic pain management evaluation. According to the patient the primary area of pain is that of the lower back in the midline. She denies any radiation of the pain to either side. According to the patient she has a prior history of back surgery 2 with the first surgery having been done 3-4 years ago by Dr. Everett Graff management. At that time, the patient was having low back pain. She indicates having had a discogram. She denies having had any lower extremity pain at the time. The second surgery was done on April 2017 by Dr. Owens Shark and once again the patient primary reason for the surgery was  the lower back with  no lower extremity pain. In addition to the surgery, the patient indicates having had several nerve blocks done. Some of the nerve blocks were done before the surgery (10) at the [facility] orthopedic practice. She indicates that they consisted of lumbar epidural steroid injections done under fluoroscopic guidance. In addition to these, the patient had some procedures done after the surgery (Times 3) done by a Dr. Andree Elk [facility] [place]. She indicated that these consisted of "steroid injections". She admits to having had some physical therapy the last of which was in 2017 and it consisted of 2 sessions per week for a period of 4 weeks. She also indicates having had some recent x-rays of the area.  The next area of pain is described to be that of the left hip. Patient actually points at the PSIS area (SI joint area). She denies any surgeries in that region or any nerve blocks.  According to the patient she treats her pain with hot packs, decreasing her level of activity, and taking pain medications. She describes taking Xtampze ER 13.5 mg capsules twice a day. This medication is a newer longer acting form of oxycodone. In addition the patient indicates taking Percocet (oxycodone/APAP) 7.5/325 one tablet by mouth 4 times a day for breakthrough pain. The patient indicates currently being a patient of the [facility]" (Dr. Shanon Brow L. Spivey). She indicates having seen multiple practitioners in the the clinic, including [employer], PA-C, Dr. Dian Situ, Park Liter, PA-C, and Dr. Balinda Quails.  Today I took the time to provide the patient with information regarding my pain practice. The patient was informed that my practice is divided into two sections: an interventional pain management section, as well as a completely separate and distinct medication management section. I explained that I have procedure days for my interventional therapies, and  evaluation days for follow-ups and medication management. Because of the amount of documentation required during both, they are kept separated. This means that there is the possibility that she may be scheduled for a procedure on one day, and medication management the next. I have also informed her that because of staffing and facility limitations, I no longer take patients for medication management only. To illustrate the reasons for this, I gave the patient the example of surgeons, and how inappropriate it would be to refer a patient to his/her care, just to write for the post-surgical antibiotics on a surgery done by a different surgeon.   Because interventional pain management is my board-certified specialty, the patient was informed that joining my practice means that they are open to any and all interventional therapies. I made it clear that this does not mean that they will be forced to have any procedures done. What this means is that I believe interventional therapies to be essential part of the diagnosis and proper management of chronic pain conditions. Therefore, patients not interested in these interventional alternatives will be better served under the care of a different practitioner.  The patient was also made aware of my Comprehensive Pain Management Safety Guidelines where by joining my practice, they limit all of their nerve blocks and joint injections to those done by our practice, for as long as we are retained to manage their care.   Historic Controlled Substance Pharmacotherapy Review  PMP and historical list of controlled substances: Oxycodone/APAP 5/325, 7.5/325, 10/325 (Percocet, Endocet); hydromorphone 2 mg; diazepam 2, 5 mg; Opana ER 5  mg (oxymorphone); oxycodone 5 mg; OxyContin 10 mg; oxymorphone ER 10 mg; Embeda ER 50-2, 60-2.4 (morphine/naltrexone) Highest opioid analgesic regimen found: Oxycodone IR 5 mg 3-1/2 tablets every 4 hours (107 mg/day of oxycodone) (160.5  MME/Day) Most recent opioid analgesic: Xtampze ER 13.5 mg capsules twice a day + Percocet (oxycodone/APAP) 7.5/325 one tablet by mouth 4 times a day (57 mg/day of oxycodone) (85.5 MME/Day) (last filled on 11/27/2016) Current opioid analgesics: Xtampze ER 13.5 mg capsules twice a day + Percocet (oxycodone/APAP) 7.5/325 one tablet by mouth 4 times a day (57 mg/day of oxycodone) (85.5 MME/Day) (last filled on 11/27/2016) Highest recorded MME/day: 160.5 mg/day MME/day: 85.5 mg/day Medications: The patient did not bring the medication(s) to the appointment, as requested in our "New Patient Package" Pharmacodynamics: Desired effects: Analgesia: The patient reports >50% benefit. Reported improvement in function: The patient reports medication allows her to accomplish basic ADLs. Clinically meaningful improvement in function (CMIF): Sustained CMIF goals met Perceived effectiveness: Described as relatively effective, allowing for increase in activities of daily living (ADL) Undesirable effects: Side-effects or Adverse reactions: None reported Historical Monitoring: The patient  reports that she does not use drugs. List of all UDS Test(s): No results found for: MDMA, COCAINSCRNUR, PCPSCRNUR, PCPQUANT, CANNABQUANT, THCU, [place] List of all Serum Drug Screening Test(s):  No results found for: AMPHSCRSER, BARBSCRSER, BENZOSCRSER, COCAINSCRSER, PCPSCRSER, PCPQUANT, THCSCRSER, CANNABQUANT, OPIATESCRSER, OXYSCRSER, PROPOXSCRSER Historical Background Evaluation: [place] PDMP: The PMP search 01/18/2011 at which time the patient was unable to tolerate taking oxycodone, although/APAP 5/325 one tablet by mouth 4 times a day (30 MME/Day) Regular, uninterrupted pattern of monthly opioid refills detected.       [place] Department of public safety, offender search: Editor, commissioning Information) Non-contributory Risk Assessment Profile: Aberrant behavior: None observed or detected today Risk factors for fatal opioid overdose: Concomitant  use of Benzodiazepines Fatal overdose hazard ratio (HR): Calculation deferred Non-fatal overdose hazard ratio (HR): Calculation deferred Risk of opioid abuse or dependence: 0.7-3.0% with doses ? 36 MME/day and 6.1-26% with doses ? 120 MME/day. Substance use disorder (SUD) risk level: Pending results of Medical Psychology Evaluation for SUD Opioid risk tool (ORT) (Total Score): 1  ORT Scoring interpretation table:  Score <3 = Low Risk for SUD  Score between 4-7 = Moderate Risk for SUD  Score >8 = High Risk for Opioid Abuse   PHQ-2 Depression Scale:  Total score: 0  PHQ-2 Scoring interpretation table: (Score and probability of major depressive disorder)  Score 0 = No depression  Score 1 = 15.4% Probability  Score 2 = 21.1% Probability  Score 3 = 38.4% Probability  Score 4 = 45.5% Probability  Score 5 = 56.4% Probability  Score 6 = 78.6% Probability   PHQ-9 Depression Scale:  Total score: 0  PHQ-9 Scoring interpretation table:  Score 0-4 = No depression  Score 5-9 = Mild depression  Score 10-14 = Moderate depression  Score 15-19 = Moderately severe depression  Score 20-27 = Severe depression (2.4 times higher risk of SUD and 2.89 times higher risk of overuse)   Pharmacologic Plan: Pending ordered tests and/or consults Initial impression: Pending review of available data and ordered tests.  Meds  Current Outpatient Prescriptions (Analgesics):  .  meloxicam (MOBIC) 15 MG tablet, Take 15 mg by mouth daily. Marland Kitchen  oxyCODONE-acetaminophen (PERCOCET) 7.5-325 MG tablet, Take 7.5-325 tablets by mouth 4 (four) times daily as needed. Marland Kitchen  XTAMPZA ER 13.5 MG C12A, TAKE 1 CAPSULE BY MOUTH 2 TIMES DAILY  Current Outpatient Prescriptions (Other):  .  DULoxetine (CYMBALTA) 30 MG capsule, Take 3 capsules (90 mg total) by mouth daily.  Imaging Review  Lumbosacral Imaging: Lumbar MR wo contrast:  Results for orders placed in visit on 10/21/05  MR [facility] W/O Cm   Narrative * PRIOR REPORT  IMPORTED FROM AN EXTERNAL SYSTEM *   PRIOR REPORT IMPORTED FROM THE [facility] WORKFLOW SYSTEM   REASON FOR EXAM:  Low back pain  COMMENTS:   PROCEDURE:     MR  - MR LUMBAR SPINE WO CONTRAST  - Oct 21 2005  9:31PM   RESULT:   HISTORY:  Low back pain.   PROCEDURE:  There is a central to LEFT paracentral L4-L5 disk protrusion that results in flattening of the thecal sac to 6 mm.  Neural foramen are intact.  No bony lesions are identified.  No other focal disk abnormality is identified.  Lumbar cord is normal.   IMPRESSION:   L4-L5 central to LEFT paracentral disk protrusion with flattening of the  thecal sac to approximately 5 mm.   Thank you for this opportunity to contribute to the care of your patient.       Lumbar DG 2-3 views:  Results for orders placed during the hospital encounter of 03/25/11  DG Lumbar Spine 2-3 Views Ordered by RMM   Narrative *RADIOLOGY REPORT*  Clinical Data: L5-S1 laminectomy and microdiskectomy.  Reported transitional vertebral anatomy with six lumbar type vertebrae on prior outside imaging.  LUMBAR SPINE - 2-3 VIEW  Comparison: No similar prior study is available for comparison.  Findings: The most inferior lumbar type vertebral body is labeled L6, as per provided clinical history.  The most superior lumbar type vertebral body is not included in the field of view.  On the initial image obtained at 7:13 p.m., a radiopaque marker projects posterior to the spinous process of L4.  On a second image obtained at 7:35 p.m., radiopaque markers are present over the spinous processes of the L5 and L6 vertebral body levels, respectively.  IMPRESSION: Intraoperative imaging as above.  Specifically, the inferior most lumbar type vertebral body is labeled L6 as per the provided history, as no similar prior imaging is available at this institution for comparison or verification.  Original Report Authenticated By: Arline Asp, M.D.   Note: Available  results from prior imaging studies were reviewed.        ROS  Cardiovascular History: No reported cardiovascular signs or symptoms such as High blood pressure, coronary artery disease, abnormal heart rate or rhythm, heart attack, blood thinner therapy or heart weakness and/or failure Pulmonary or Respiratory History: No reported pulmonary signs or symptoms such as wheezing and difficulty taking a deep full breath (Asthma), difficulty blowing air out (Emphysema), coughing up mucus (Bronchitis), persistent dry cough, or temporary stoppage of breathing during sleep Neurological History: No reported neurological signs or symptoms such as seizures, abnormal skin sensations, urinary and/or fecal incontinence, being born with an abnormal open spine and/or a tethered spinal cord Review of Past Neurological Studies: No results found for this or any previous visit. Psychological-Psychiatric History: Depressed Gastrointestinal History: No reported gastrointestinal signs or symptoms such as vomiting or evacuating blood, reflux, heartburn, alternating episodes of diarrhea and constipation, inflamed or scarred liver, or pancreas or irrregular and/or infrequent bowel movements Genitourinary History: No reported renal or genitourinary signs or symptoms such as difficulty voiding or producing urine, peeing blood, non-functioning kidney, kidney stones, difficulty emptying the bladder, difficulty controlling the flow of urine, or chronic kidney disease  Hematological History: No reported hematological signs or symptoms such as prolonged bleeding, low or poor functioning platelets, bruising or bleeding easily, hereditary bleeding problems, low energy levels due to low hemoglobin or being anemic Endocrine History: No reported endocrine signs or symptoms such as high or low blood sugar, rapid heart rate due to high thyroid levels, obesity or weight gain due to slow thyroid or thyroid disease Rheumatologic History: No reported  rheumatological signs and symptoms such as fatigue, joint pain, tenderness, swelling, redness, heat, stiffness, decreased range of motion, with or without associated rash Musculoskeletal History: Negative for myasthenia gravis, muscular dystrophy, multiple sclerosis or malignant hyperthermia Work History: Unemployed and Quit going to work on his/her own  Allergies  Ms. Lincoln is allergic to codeine.  Laboratory Chemistry  Inflammation Markers Lab Results  Component Value Date   CRP 5.3 (H) 12/05/2016   ESRSEDRATE 9 12/05/2016   (CRP: Acute Phase) (ESR: Chronic Phase) Renal Function Markers Lab Results  Component Value Date   BUN 17 12/05/2016   CREATININE 0.84 12/05/2016   GFRAA 96 12/05/2016   GFRNONAA 83 12/05/2016   Hepatic Function Markers Lab Results  Component Value Date   AST 21 12/05/2016   ALT 20 12/05/2016   ALBUMIN 4.4 12/05/2016   ALKPHOS 59 12/05/2016   Electrolytes Lab Results  Component Value Date   NA 139 12/05/2016   K 4.6 12/05/2016   CL 103 12/05/2016   CALCIUM 9.5 12/05/2016   MG 2.1 12/05/2016   Neuropathy Markers Lab Results  Component Value Date   VITAMINB12 495 12/05/2016   Bone Pathology Markers Lab Results  Component Value Date   ALKPHOS 59 12/05/2016   VD25OH 27.1 (L) 09/18/2016   25OHVITD1 WILL FOLLOW 12/05/2016   25OHVITD2 WILL FOLLOW 12/05/2016   25OHVITD3 WILL FOLLOW 12/05/2016   CALCIUM 9.5 12/05/2016   Coagulation Parameters Lab Results  Component Value Date   INR 0.9 04/16/2013   LABPROT 12.4 04/16/2013   PLT 287 10/02/2016   Cardiovascular Markers Lab Results  Component Value Date   HGB 13.3 10/02/2016   HCT 40.8 10/02/2016   Note: Lab results reviewed.  PFSH  Drug: Ms. Henthorn  reports that she does not use drugs. Alcohol:  reports that she does not drink alcohol. Tobacco:  reports that she has been smoking Cigarettes.  She has a 20.00 pack-year smoking history. She has never used smokeless tobacco. Medical:  has a  past medical history of Allergy; Anxiety; Chronic bilateral low back pain (07/22/2016); and Depression. Family: family history includes Arthritis in her father; Cancer in her maternal aunt; Depression in her sister and sister; Diabetes in her mother; Heart disease in her father and maternal grandfather; Hyperlipidemia in her father; Hypertension in her father and mother; Lung disease in her father.  Past Surgical History:  Procedure Laterality Date  . ABDOMINAL HYSTERECTOMY    . BACK SURGERY     two back sx one in [place] and one at [place]  . CESAREAN SECTION    . SPINE SURGERY    . SPINE SURGERY  09/09/2015  . TONSILLECTOMY     Active Ambulatory Problems    Diagnosis Date Noted  . Adjustment disorder with depressed mood 03/10/2015  . Stressful life events affecting family and household 03/10/2015  . Preventative health care 03/10/2015  . Vitamin D deficiency 03/10/2015  . Lumbosacral spondylosis without myelopathy 03/12/2015  . Lumbar central spinal stenosis (L4-5) 03/12/2015  . Breast cancer screening 04/03/2015  . Bladder irritation 04/03/2015  .  Chronic low back pain (Location of Primary Source of Pain) (midline) 07/22/2016  . Arthralgia of multiple joints 10/02/2016  . Chronic, continuous use of opioids 10/03/2016  . Elevated rheumatoid factor 10/22/2016  . Chronic pain syndrome 12/05/2016  . Long term (current) use of opiate analgesic 12/05/2016  . Long term prescription opiate use 12/05/2016  . Opiate use (85.5 MME/Day) 12/05/2016  . Chronic hip pain (Location of Secondary source of pain) (Left) 12/05/2016  . Failed back surgical syndrome 12/05/2016  . Discogenic low back pain 12/05/2016  . Lumbar discogenic pain syndrome 12/05/2016  . Chronic sacroiliac joint pain (Left) 12/05/2016   Resolved Ambulatory Problems    Diagnosis Date Noted  . Abnormal weight loss 03/10/2015  . Chest tightness 07/28/2016   Past Medical History:  Diagnosis Date  . Allergy   . Anxiety    . Chronic bilateral low back pain 07/22/2016  . Depression    Constitutional Exam  General appearance: Well nourished, well developed, and well hydrated. In no apparent acute distress Vitals:   12/05/16 0936  BP: 116/88  Pulse: 88  Resp: 16  Temp: 98.2 F (36.8 C)  TempSrc: Oral  SpO2: 98%  Weight: 185 lb (83.9 kg)  Height: _0  (1.676 m)   BMI Assessment: Estimated body mass index is 29.86 kg/m as calculated from the following:   Height as of this encounter: _1  (1.676 m).   Weight as of this encounter: 185 lb (83.9 kg).  BMI interpretation table: BMI level Category Range association with higher incidence of chronic pain  <18 kg/m2 Underweight   18.5-24.9 kg/m2 Ideal body weight   25-29.9 kg/m2 Overweight Increased incidence by 20%  30-34.9 kg/m2 Obese (Class I) Increased incidence by 68%  35-39.9 kg/m2 Severe obesity (Class II) Increased incidence by 136%  >40 kg/m2 Extreme obesity (Class III) Increased incidence by 254%   BMI Readings from Last 4 Encounters:  12/05/16 29.86 kg/m  10/02/16 30.17 kg/m  09/18/16 29.86 kg/m  07/22/16 29.66 kg/m   Wt Readings from Last 4 Encounters:  12/05/16 185 lb (83.9 kg)  10/02/16 186 lb 14.4 oz (84.8 kg)  09/18/16 185 lb (83.9 kg)  07/22/16 182 lb 6.4 oz (82.7 kg)  Psych/Mental status: Alert, oriented x 3 (person, place, & time)       Eyes: PERLA Respiratory: No evidence of acute respiratory distress  Cervical Spine Exam  Inspection: No masses, redness, or swelling Alignment: Symmetrical Functional ROM: Unrestricted ROM      Stability: No instability detected Muscle strength & Tone: Functionally intact Sensory: Unimpaired Palpation: No palpable anomalies              Upper Extremity (UE) Exam    Side: Right upper extremity  Side: Left upper extremity  Inspection: No masses, redness, swelling, or asymmetry. No contractures  Inspection: No masses, redness, swelling, or asymmetry. No contractures  Functional ROM:  Unrestricted ROM          Functional ROM: Unrestricted ROM          Muscle strength & Tone: Functionally intact  Muscle strength & Tone: Functionally intact  Sensory: Unimpaired  Sensory: Unimpaired  Palpation: No palpable anomalies              Palpation: No palpable anomalies              Specialized Test(s): Deferred         Specialized Test(s): Deferred          Thoracic Spine Exam  Inspection: No masses, redness, or swelling Alignment: Symmetrical Functional ROM: Unrestricted ROM Stability: No instability detected Sensory: Unimpaired Muscle strength & Tone: No palpable anomalies  Lumbar Spine Exam  Inspection: Well healed scar from previous spine surgery detected Alignment: Symmetrical Functional ROM: Decreased ROM      Stability: No instability detected Muscle strength & Tone: Functionally intact Sensory: Movement-associated pain Palpation: Complains of area being tender to palpation       Provocative Tests: Lumbar Hyperextension and rotation test: evaluation deferred today       Patrick's Maneuver: evaluation deferred today                    Gait & Posture Assessment  Ambulation: Unassisted Gait: Relatively normal for age and body habitus Posture: WNL   Lower Extremity Exam    Side: Right lower extremity  Side: Left lower extremity  Inspection: No masses, redness, swelling, or asymmetry. No contractures  Inspection: No masses, redness, swelling, or asymmetry. No contractures  Functional ROM: Unrestricted ROM          Functional ROM: Unrestricted ROM          Muscle strength & Tone: Functionally intact  Muscle strength & Tone: Functionally intact  Sensory: Unimpaired  Sensory: Unimpaired  Palpation: No palpable anomalies  Palpation: No palpable anomalies   Assessment  Primary Diagnosis & Pertinent Problem List: The primary encounter diagnosis was Chronic low back pain (Location of Primary Source of Pain) (midline). Diagnoses of Chronic hip pain (Location of Secondary  source of pain) (Left), Failed back surgical syndrome, Lumbosacral spondylosis without myelopathy, Discogenic low back pain, Lumbar central spinal stenosis (L4-5), Lumbar discogenic pain syndrome, Chronic left sacroiliac joint pain, Chronic pain syndrome, Long term (current) use of opiate analgesic, Long term prescription opiate use, Opiate use, Vitamin D deficiency, and Elevated rheumatoid factor were also pertinent to this visit.  Visit Diagnosis: 1. Chronic low back pain (Location of Primary Source of Pain) (midline)   2. Chronic hip pain (Location of Secondary source of pain) (Left)   3. Failed back surgical syndrome   4. Lumbosacral spondylosis without myelopathy   5. Discogenic low back pain   6. Lumbar central spinal stenosis (L4-5)   7. Lumbar discogenic pain syndrome   8. Chronic left sacroiliac joint pain   9. Chronic pain syndrome   10. Long term (current) use of opiate analgesic   11. Long term prescription opiate use   12. Opiate use   13. Vitamin D deficiency   14. Elevated rheumatoid factor    Plan of Care  Initial treatment plan:  Please be advised that as per protocol, today's visit has been an evaluation only. We have not taken over the patient's controlled substance management.  Problem-specific plan: No problem-specific Assessment & Plan notes found for this encounter.  Ordered Lab-work, Procedure(s), Referral(s), & Consult(s): Orders Placed This Encounter  Procedures  . DG HIP UNILAT W OR W/O PELVIS 2-3 VIEWS LEFT  . DG Si Joints  . Compliance Drug Analysis, Ur  . Ambulatory referral to Psychology   Pharmacotherapy (current): Medications ordered:  No orders of the defined types were placed in this encounter.  Medications administered during this visit: Ms. Austell had no medications administered during this visit.   Pharmacotherapy under consideration:  Opioid Analgesics: The patient was informed that there is no guarantee that she would be a candidate for  opioid analgesics. The decision will be made following CDC guidelines. This decision will be based on the  results of diagnostic studies, as well as Ms. Raya's risk profile.   Membrane stabilizer: To be determined at a later time  Muscle relaxant: To be determined at a later time  NSAID: To be determined at a later time  Other analgesic(s): To be determined at a later time   Interventional therapies under consideration: Ms. Baskerville was informed that there is no guarantee that she would be a candidate for interventional therapies. The decision will be based on the results of diagnostic studies, as well as Ms. Forget's risk profile.  Possible procedure(s): Diagnostic bilateral lumbar facet block  Possible bilateral lumbar facet RFA  Diagnostic left sacroiliac joint block  Possible left sacroiliac joint RFA  Diagnostic left intra-articular hip joint injection  Possible left femoral nerve and obturator nerve block  Possible left femoral nerve and obturator nerve RFA  Diagnostic left L4-5 interlaminar lumbar epidural steroid injection  Diagnostic left L4-5 transforaminal epidural steroid injection  Diagnostic left L4 selective nerve root block Possible left L4 nerve root ganglion RFA Diagnostic left sided caudal epidural steroid injection + diagnostic epidurogram Possible left-sided Racz procedure Possible lumbar spinal cord stimulator trial    Provider-requested follow-up: Return for 2nd Visit, after [facility] eval.  No future appointments.  Primary Care Physician: Arnetha Courser, MD Location: [facility] Note by: Kathlen Brunswick Dossie Arbour, M.D, DABA, DABAPM, DABPM, DABIPP, FIPP Date: 12/05/2016; Time: 11:03 AM  Patient Instructions   You have been instructed to get lab and see medical psychologist and to make an appointment to return when these things are done.  ____________________________________________________________________________________________  Appointment  Policy Summary  It is our goal and responsibility to provide the medical community with assistance in the evaluation and management of patients with chronic pain. Unfortunately our resources are limited. Because we do not have an unlimited amount of time, or available appointments, we are required to closely monitor and manage their use. The following rules exist to maximize their use:  Patient's responsibilities: 1. Punctuality: You are required to be physically present and registered in our facility at least 30 minutes before your appointment. 2. Tardiness: The cutoff is your appointment time. If you have an appointment scheduled for 10:00 AM and you arrive at 10:01, you will be required to reschedule your appointment.  3. Plan ahead: Always assume that you will encounter traffic on your way in. Plan for it. If you are dependent on a driver, make sure they understand these rules and the need to arrive early. 4. Other appointments and responsibilities: Avoid scheduling any other appointments before or after your pain clinic appointments.  5. Be prepared: Write down everything that you need to discuss with your healthcare provider and give this information to the admitting nurse. Write down the medications that you will need refilled. Bring your pills and bottles (even the empty ones), to all of your appointments, except for those where a procedure is scheduled. 6. No children or pets: Find someone to take care of them. It is not appropriate to bring them in. 7. Scheduling changes: We request "advanced notification" of any changes or cancellations. 8. Advanced notification: Defined as a time period of more than 24 hours prior to the originally scheduled appointment. This allows for the appointment to be offered to other patients. 9. Rescheduling: When a visit is rescheduled, it will require the cancellation of the original appointment. For this reason they both fall within the category of  "Cancellations".  10. Cancellations: They require advanced notification. Any cancellation less  than 24 hours before the  appointment will be recorded as a "No Show". 11. No Show: Defined as an unkept appointment where the patient failed to notify or declare to the practice their intention or inability to keep the appointment.  Corrective process for repeat offenders:  1. Tardiness: Three (3) episodes of rescheduling due to late arrivals will be recorded as one (1) "No Show". 2. Cancellation or reschedule: Three (3) cancellations or rescheduling will be recorded as one (1) "No Show". 3. "No Shows": Three (3) "No Shows" within a 12 month period will result in discharge from the practice.  ____________________________________________________________________________________________  ____________________________________________________________________________________________  Pain Scale  Introduction: The pain score used by this practice is the Verbal Numerical Rating Scale (VNRS-11). This is an 11-point scale. It is for adults and children 10 years or older. There are significant differences in how the pain score is reported, used, and applied. Forget everything you learned in the past and learn this scoring system.  General Information: The scale should reflect your current level of pain. Unless you are specifically asked for the level of your worst pain, or your average pain. If you are asked for one of these two, then it should be understood that it is over the past 24 hours.  Basic Activities of Daily Living (ADL): Personal hygiene, dressing, eating, transferring, and using restroom.  Instructions: Most patients tend to report their level of pain as a combination of two factors, their physical pain and their psychosocial pain. This last one is also known as "suffering" and it is reflection of how physical pain affects you socially and psychologically. From now on, report them separately. From  this point on, when asked to report your pain level, report only your physical pain. Use the following table for reference.  Pain Clinic Pain Levels (0-5/10)  Pain Level Score  Description  No Pain 0   Mild pain 1 Nagging, annoying, but does not interfere with basic activities of daily living (ADL). Patients are able to eat, bathe, get dressed, toileting (being able to get on and off the toilet and perform personal hygiene functions), transfer (move in and out of bed or a chair without assistance), and maintain continence (able to control bladder and bowel functions). Blood pressure and heart rate are unaffected. A normal heart rate for a healthy adult ranges from 60 to 100 bpm (beats per minute).   Mild to moderate pain 2 Noticeable and distracting. Impossible to hide from other people. More frequent flare-ups. Still possible to adapt and function close to normal. It can be very annoying and may have occasional stronger flare-ups. With discipline, patients may get used to it and adapt.   Moderate pain 3 Interferes significantly with activities of daily living (ADL). It becomes difficult to feed, bathe, get dressed, get on and off the toilet or to perform personal hygiene functions. Difficult to get in and out of bed or a chair without assistance. Very distracting. With effort, it can be ignored when deeply involved in activities.   Moderately severe pain 4 Impossible to ignore for more than a few minutes. With effort, patients may still be able to manage work or participate in some social activities. Very difficult to concentrate. Signs of autonomic nervous system discharge are evident: dilated pupils (mydriasis); mild sweating (diaphoresis); sleep interference. Heart rate becomes elevated (>115 bpm). Diastolic blood pressure (lower number) rises above 100 mmHg. Patients find relief in laying down and not moving.   Severe pain 5 Intense and extremely  unpleasant. Associated with frowning face and  frequent crying. Pain overwhelms the senses.  Ability to do any activity or maintain social relationships becomes significantly limited. Conversation becomes difficult. Pacing back and forth is common, as getting into a comfortable position is nearly impossible. Pain wakes you up from deep sleep. Physical signs will be obvious: pupillary dilation; increased sweating; goosebumps; brisk reflexes; cold, clammy hands and feet; nausea, vomiting or dry heaves; loss of appetite; significant sleep disturbance with inability to fall asleep or to remain asleep. When persistent, significant weight loss is observed due to the complete loss of appetite and sleep deprivation.  Blood pressure and heart rate becomes significantly elevated. Caution: If elevated blood pressure triggers a pounding headache associated with blurred vision, then the patient should immediately seek attention at an urgent or emergency care unit, as these may be signs of an impending stroke.    Emergency Department Pain Levels (6-10/10)  Emergency Room Pain 6 Severely limiting. Requires emergency care and should not be seen or managed at an outpatient pain management facility. Communication becomes difficult and requires great effort. Assistance to reach the emergency department may be required. Facial flushing and profuse sweating along with potentially dangerous increases in heart rate and blood pressure will be evident.   Distressing pain 7 Self-care is very difficult. Assistance is required to transport, or use restroom. Assistance to reach the emergency department will be required. Tasks requiring coordination, such as bathing and getting dressed become very difficult.   Disabling pain 8 Self-care is no longer possible. At this level, pain is disabling. The individual is unable to do even the most "basic" activities such as walking, eating, bathing, dressing, transferring to a bed, or toileting. Fine motor skills are lost. It is difficult to  think clearly.   Incapacitating pain 9 Pain becomes incapacitating. Thought processing is no longer possible. Difficult to remember your own name. Control of movement and coordination are lost.   The worst pain imaginable 10 At this level, most patients pass out from pain. When this level is reached, collapse of the autonomic nervous system occurs, leading to a sudden drop in blood pressure and heart rate. This in turn results in a temporary and dramatic drop in blood flow to the brain, leading to a loss of consciousness. Fainting is one of the body's self defense mechanisms. Passing out puts the brain in a calmed state and causes it to shut down for a while, in order to begin the healing process.    Summary: 1. Refer to this scale when providing [place] with your pain level. 2. Be accurate and careful when reporting your pain level. This will help with your care. 3. Over-reporting your pain level will lead to loss of credibility. 4. Even a level of 1/10 means that there is pain and will be treated at our facility. 5. High, inaccurate reporting will be documented as "Symptom Exaggeration", leading to loss of credibility and suspicions of possible secondary gains such as obtaining more narcotics, or wanting to appear disabled, for fraudulent reasons. 6. Only pain levels of 5 or below will be seen at our facility. 7. Pain levels of 6 and above will be sent to the Emergency Department and the appointment cancelled. ____________________________________________________________________________________________

## 2016-12-05 NOTE — Patient Instructions (Addendum)
You have been instructed to get lab and see medical psychologist and to make an appointment to return when these things are done.  ____________________________________________________________________________________________  Appointment Policy Summary  It is our goal and responsibility to provide the medical community with assistance in the evaluation and management of patients with chronic pain. Unfortunately our resources are limited. Because we do not have an unlimited amount of time, or available appointments, we are required to closely monitor and manage their use. The following rules exist to maximize their use:  Patient's responsibilities: 1. Punctuality: You are required to be physically present and registered in our facility at least 30 minutes before your appointment. 2. Tardiness: The cutoff is your appointment time. If you have an appointment scheduled for 10:00 AM and you arrive at 10:01, you will be required to reschedule your appointment.  3. Plan ahead: Always assume that you will encounter traffic on your way in. Plan for it. If you are dependent on a driver, make sure they understand these rules and the need to arrive early. 4. Other appointments and responsibilities: Avoid scheduling any other appointments before or after your pain clinic appointments.  5. Be prepared: Write down everything that you need to discuss with your healthcare provider and give this information to the admitting nurse. Write down the medications that you will need refilled. Bring your pills and bottles (even the empty ones), to all of your appointments, except for those where a procedure is scheduled. 6. No children or pets: Find someone to take care of them. It is not appropriate to bring them in. 7. Scheduling changes: We request "advanced notification" of any changes or cancellations. 8. Advanced notification: Defined as a time period of more than 24 hours prior to the originally scheduled appointment.  This allows for the appointment to be offered to other patients. 9. Rescheduling: When a visit is rescheduled, it will require the cancellation of the original appointment. For this reason they both fall within the category of "Cancellations".  10. Cancellations: They require advanced notification. Any cancellation less than 24 hours before the  appointment will be recorded as a "No Show". 11. No Show: Defined as an unkept appointment where the patient failed to notify or declare to the practice their intention or inability to keep the appointment.  Corrective process for repeat offenders:  1. Tardiness: Three (3) episodes of rescheduling due to late arrivals will be recorded as one (1) "No Show". 2. Cancellation or reschedule: Three (3) cancellations or rescheduling will be recorded as one (1) "No Show". 3. "No Shows": Three (3) "No Shows" within a 12 month period will result in discharge from the practice.  ____________________________________________________________________________________________  ____________________________________________________________________________________________  Pain Scale  Introduction: The pain score used by this practice is the Verbal Numerical Rating Scale (VNRS-11). This is an 11-point scale. It is for adults and children 10 years or older. There are significant differences in how the pain score is reported, used, and applied. Forget everything you learned in the past and learn this scoring system.  General Information: The scale should reflect your current level of pain. Unless you are specifically asked for the level of your worst pain, or your average pain. If you are asked for one of these two, then it should be understood that it is over the past 24 hours.  Basic Activities of Daily Living (ADL): Personal hygiene, dressing, eating, transferring, and using restroom.  Instructions: Most patients tend to report their level of pain as a combination of two  factors, their physical pain and their psychosocial pain. This last one is also known as "suffering" and it is reflection of how physical pain affects you socially and psychologically. From now on, report them separately. From this point on, when asked to report your pain level, report only your physical pain. Use the following table for reference.  Pain Clinic Pain Levels (0-5/10)  Pain Level Score  Description  No Pain 0   Mild pain 1 Nagging, annoying, but does not interfere with basic activities of daily living (ADL). Patients are able to eat, bathe, get dressed, toileting (being able to get on and off the toilet and perform personal hygiene functions), transfer (move in and out of bed or a chair without assistance), and maintain continence (able to control bladder and bowel functions). Blood pressure and heart rate are unaffected. A normal heart rate for a healthy adult ranges from 60 to 100 bpm (beats per minute).   Mild to moderate pain 2 Noticeable and distracting. Impossible to hide from other people. More frequent flare-ups. Still possible to adapt and function close to normal. It can be very annoying and may have occasional stronger flare-ups. With discipline, patients may get used to it and adapt.   Moderate pain 3 Interferes significantly with activities of daily living (ADL). It becomes difficult to feed, bathe, get dressed, get on and off the toilet or to perform personal hygiene functions. Difficult to get in and out of bed or a chair without assistance. Very distracting. With effort, it can be ignored when deeply involved in activities.   Moderately severe pain 4 Impossible to ignore for more than a few minutes. With effort, patients may still be able to manage work or participate in some social activities. Very difficult to concentrate. Signs of autonomic nervous system discharge are evident: dilated pupils (mydriasis); mild sweating (diaphoresis); sleep interference. Heart rate becomes  elevated (>115 bpm). Diastolic blood pressure (lower number) rises above 100 mmHg. Patients find relief in laying down and not moving.   Severe pain 5 Intense and extremely unpleasant. Associated with frowning face and frequent crying. Pain overwhelms the senses.  Ability to do any activity or maintain social relationships becomes significantly limited. Conversation becomes difficult. Pacing back and forth is common, as getting into a comfortable position is nearly impossible. Pain wakes you up from deep sleep. Physical signs will be obvious: pupillary dilation; increased sweating; goosebumps; brisk reflexes; cold, clammy hands and feet; nausea, vomiting or dry heaves; loss of appetite; significant sleep disturbance with inability to fall asleep or to remain asleep. When persistent, significant weight loss is observed due to the complete loss of appetite and sleep deprivation.  Blood pressure and heart rate becomes significantly elevated. Caution: If elevated blood pressure triggers a pounding headache associated with blurred vision, then the patient should immediately seek attention at an urgent or emergency care unit, as these may be signs of an impending stroke.    Emergency Department Pain Levels (6-10/10)  Emergency Room Pain 6 Severely limiting. Requires emergency care and should not be seen or managed at an outpatient pain management facility. Communication becomes difficult and requires great effort. Assistance to reach the emergency department may be required. Facial flushing and profuse sweating along with potentially dangerous increases in heart rate and blood pressure will be evident.   Distressing pain 7 Self-care is very difficult. Assistance is required to transport, or use restroom. Assistance to reach the emergency department will be required. Tasks requiring coordination, such as bathing and  getting dressed become very difficult.   Disabling pain 8 Self-care is no longer possible. At  this level, pain is disabling. The individual is unable to do even the most "basic" activities such as walking, eating, bathing, dressing, transferring to a bed, or toileting. Fine motor skills are lost. It is difficult to think clearly.   Incapacitating pain 9 Pain becomes incapacitating. Thought processing is no longer possible. Difficult to remember your own name. Control of movement and coordination are lost.   The worst pain imaginable 10 At this level, most patients pass out from pain. When this level is reached, collapse of the autonomic nervous system occurs, leading to a sudden drop in blood pressure and heart rate. This in turn results in a temporary and dramatic drop in blood flow to the brain, leading to a loss of consciousness. Fainting is one of the body's self defense mechanisms. Passing out puts the brain in a calmed state and causes it to shut down for a while, in order to begin the healing process.    Summary: 1. Refer to this scale when providing [place] with your pain level. 2. Be accurate and careful when reporting your pain level. This will help with your care. 3. Over-reporting your pain level will lead to loss of credibility. 4. Even a level of 1/10 means that there is pain and will be treated at our facility. 5. High, inaccurate reporting will be documented as "Symptom Exaggeration", leading to loss of credibility and suspicions of possible secondary gains such as obtaining more narcotics, or wanting to appear disabled, for fraudulent reasons. 6. Only pain levels of 5 or below will be seen at our facility. 7. Pain levels of 6 and above will be sent to the Emergency Department and the appointment cancelled. ____________________________________________________________________________________________

## 2016-12-05 NOTE — Progress Notes (Signed)
Safety precautions to be maintained throughout the outpatient stay will include: orient to surroundings, keep bed in low position, maintain call bell within reach at all times, provide assistance with transfer out of bed and ambulation.  

## 2016-12-09 LAB — 25-HYDROXY VITAMIN D LCMS D2+D3: 25-Hydroxy, Vitamin D-3: 49 ng/mL

## 2016-12-09 LAB — COMPREHENSIVE METABOLIC PANEL
ALK PHOS: 59 IU/L (ref 39–117)
ALT: 20 IU/L (ref 0–32)
AST: 21 IU/L (ref 0–40)
Albumin/Globulin Ratio: 2 (ref 1.2–2.2)
Albumin: 4.4 g/dL (ref 3.5–5.5)
BUN/Creatinine Ratio: 20 (ref 9–23)
BUN: 17 mg/dL (ref 6–24)
Bilirubin Total: 0.4 mg/dL (ref 0.0–1.2)
CO2: 24 mmol/L (ref 20–29)
CREATININE: 0.84 mg/dL (ref 0.57–1.00)
Calcium: 9.5 mg/dL (ref 8.7–10.2)
Chloride: 103 mmol/L (ref 96–106)
GFR calc Af Amer: 96 mL/min/{1.73_m2} (ref >59)
GFR calc non Af Amer: 83 mL/min/{1.73_m2} (ref >59)
GLOBULIN, TOTAL: 2.2 g/dL (ref 1.5–4.5)
Glucose: 85 mg/dL (ref 65–99)
POTASSIUM: 4.6 mmol/L (ref 3.5–5.2)
SODIUM: 139 mmol/L (ref 134–144)
Total Protein: 6.6 g/dL (ref 6.0–8.5)

## 2016-12-09 LAB — VITAMIN B12: VITAMIN B 12: 495 pg/mL (ref 232–1245)

## 2016-12-09 LAB — SEDIMENTATION RATE: Sed Rate: 9 mm/hr (ref 0–32)

## 2016-12-09 LAB — 25-HYDROXYVITAMIN D LCMS D2+D3: 25-HYDROXY, VITAMIN D: 49 ng/mL

## 2016-12-09 LAB — MAGNESIUM: Magnesium: 2.1 mg/dL (ref 1.6–2.3)

## 2016-12-09 LAB — RHEUMATOID FACTOR: Rhuematoid fact SerPl-aCnc: 17.9 IU/mL — ABNORMAL HIGH (ref 0.0–13.9)

## 2016-12-09 LAB — C-REACTIVE PROTEIN: CRP: 5.3 mg/L — ABNORMAL HIGH (ref 0.0–4.9)

## 2016-12-10 ENCOUNTER — Telehealth: Payer: Self-pay

## 2016-12-10 LAB — COMPLIANCE DRUG ANALYSIS, UR

## 2016-12-10 NOTE — Telephone Encounter (Signed)
Faxed authorization for information request to preferred pain management in [place] for patient notes

## 2016-12-16 NOTE — Progress Notes (Signed)
Patient's Name: [name]  MRN: [id number]  Referring Provider: Arnetha Courser, MD  DOB: 05-15-1971  PCP: Arnetha Courser, MD  DOS: 12/17/2016  Note by: Gaspar Cola, MD  Service setting: Ambulatory outpatient  Specialty: Interventional Pain Management  Location: [name] (AMB) Pain Management Facility    Patient type: Established   Primary Reason(s) for Visit: Encounter for evaluation before starting new chronic pain management plan of care (Level of risk: moderate) CC: Back Pain (middle, lower)  HPI  [name] is a 46 y.o. year old, female patient, who comes today for a follow-up evaluation to review the test results and decide on a treatment plan. She has Adjustment disorder with depressed mood; Stressful life events affecting family and household; Preventative health care; Vitamin D deficiency; Lumbosacral spondylosis without myelopathy; Lumbar central spinal stenosis (L4-5); Breast cancer screening; Bladder irritation; Chronic low back pain (Location of Primary Source of Pain) (midline); Arthralgia of multiple joints; Chronic, continuous use of opioids; Elevated rheumatoid factor; Chronic pain syndrome; Long term (current) use of opiate analgesic; Long term prescription opiate use; Opiate use (85.5 MME/Day); Chronic hip pain (Location of Secondary source of pain) (Left); Failed back surgical syndrome; Discogenic low back pain; Lumbar discogenic pain syndrome; Chronic sacroiliac joint pain (Left); Musculoskeletal pain; Lumbar facet syndrome (Bilateral); and Lumbar spondylosis on her problem list. Her primarily concern today is the Back Pain (middle, lower)  Pain Assessment: Location: Lower Back Radiating: left hip Onset: More than a month ago Duration: Chronic pain Quality: Aching, Dull, Throbbing Severity: 8 /10 (self-reported pain score)  Note: Reported level is inconsistent with clinical observations. Clinically the patient looks like a 4/10 Information on the proper use of the pain scale  provided to the patient today Effect on ADL:   Timing: Constant Modifying factors: heat, hot bath  [name] comes in today for a follow-up visit after her initial evaluation on 12/10/2016. Today we went over the results of her tests. These were explained in "Layman's terms". During today's appointment we went over my diagnostic impression, as well as the proposed treatment plan.  In considering the treatment plan options, Ms. Wickwire was reminded that I no longer take patients for medication management only. I asked her to let me know if she had no intention of taking advantage of the interventional therapies, so that we could make arrangements to provide this space to someone interested. I also made it clear that undergoing interventional therapies for the purpose of getting pain medications is very inappropriate on the part of a patient, and it will not be tolerated in this practice. This type of behavior would suggest true addiction and therefore it requires referral to an addiction specialist.   Further details on both, my assessment(s), as well as the proposed treatment plan, please see below.  Controlled Substance Pharmacotherapy Assessment REMS (Risk Evaluation and Mitigation Strategy)  Analgesic: Xtampze ER 13.5 mg capsules twice a day + Percocet (oxycodone/APAP) 7.5/325 one tablet by mouth 4 times a day (57 mg/day of oxycodone) (85.5 MME/Day) (last filled on 11/27/2016) Highest recorded MME/day: 160.5 mg/day MME/day: 85.5 mg/day. Pill Count: None expected due to no prior prescriptions written by our practice. Landis Martins, RN  12/17/2016 10:05 AM  Sign at close encounter Safety precautions to be maintained throughout the outpatient stay will include: orient to surroundings, keep bed in low position, maintain call bell within reach at all times, provide assistance with transfer out of bed and ambulation.    Pharmacokinetics: Liberation and  absorption (onset of action): WNL Distribution  (time to peak effect): WNL Metabolism and excretion (duration of action): WNL         Pharmacodynamics: Desired effects: Analgesia: Ms. Chandran reports >50% benefit. Functional ability: Patient reports that medication allows her to accomplish basic ADLs Clinically meaningful improvement in function (CMIF): Sustained CMIF goals met Perceived effectiveness: Described as relatively effective, allowing for increase in activities of daily living (ADL) Undesirable effects: Side-effects or Adverse reactions: None reported Monitoring: [place] PMP: Online review of the past 93-monthperiod previously conducted. Not applicable at this point since we have not taken over the patient's medication management yet. List of all Serum Drug Screening Test(s):  No results found for: AMPHSCRSER, BARBSCRSER, BENZOSCRSER, COCAINSCRSER, PCPSCRSER, THCSCRSER, OPIATESCRSER, OXYSCRSER, [place] of [place] UDS test(s) done:  Lab Results  Component Value Date   SUMMARY FINAL 12/05/2016   Last UDS on record: Summary  Date Value Ref Range Status  12/05/2016 FINAL  Final    Comment:    ==================================================================== TOXASSURE COMP DRUG ANALYSIS,UR ==================================================================== Test                             Result       Flag       Units Drug Present and Declared for Prescription Verification   Oxycodone                      386          EXPECTED   ng/mg creat   Oxymorphone                    4143         EXPECTED   ng/mg creat   Noroxycodone                   4130         EXPECTED   ng/mg creat   Noroxymorphone                 646          EXPECTED   ng/mg creat    Sources of oxycodone are scheduled prescription medications.    Oxymorphone, noroxycodone, and noroxymorphone are expected    metabolites of oxycodone. Oxymorphone is also available as a    scheduled prescription medication.   Acetaminophen                  PRESENT       EXPECTED Drug Absent but Declared for Prescription Verification   Duloxetine                     Not Detected UNEXPECTED ==================================================================== Test                      Result    Flag   Units      Ref Range   Creatinine              207              mg/dL      >=20 ==================================================================== Declared Medications:  The flagging and interpretation on this report are based on the  following declared medications.  Unexpected results may arise from  inaccuracies in the declared medications.  **Note: The testing scope of this panel includes these medications:  Duloxetine  Oxycodone  Oxycodone (Oxycodone Acetaminophen)  **Note: The  testing scope of this panel does not include small to  moderate amounts of these reported medications:  Acetaminophen (Oxycodone Acetaminophen)  **Note: The testing scope of this panel does not include following  reported medications:  Meloxicam ==================================================================== For clinical consultation, please call [phone]. ====================================================================    UDS interpretation: Unexpected findings not considered significantly abnormal          Medication Assessment Form: Patient introduced to form today Treatment compliance: Treatment may start today if patient agrees with proposed plan. Evaluation of compliance is not applicable at this point Risk Assessment Profile: Aberrant behavior: See initial evaluations. None observed or detected today Comorbid factors increasing risk of overdose: See initial evaluation. No additional risks detected today Risk Mitigation Strategies:  Patient opioid safety counseling: Completed today. Counseling provided to patient as per "Patient Counseling Document". Document signed by patient, attesting to counseling and understanding Patient-Prescriber Agreement (PPA):  Obtained today.  Controlled substance notification to other providers: Written and sent today.  Pharmacologic Plan: Today we may be taking over the patient's pharmacological regimen. See below             Laboratory Chemistry  Inflammation Markers (CRP: Acute Phase) (ESR: Chronic Phase) Lab Results  Component Value Date   CRP 5.3 (H) 12/05/2016   ESRSEDRATE 9 12/05/2016                 Renal Function Markers Lab Results  Component Value Date   BUN 17 12/05/2016   CREATININE 0.84 12/05/2016   GFRAA 96 12/05/2016   GFRNONAA 83 12/05/2016                 Hepatic Function Markers Lab Results  Component Value Date   AST 21 12/05/2016   ALT 20 12/05/2016   ALBUMIN 4.4 12/05/2016   ALKPHOS 59 12/05/2016                 Electrolytes Lab Results  Component Value Date   NA 139 12/05/2016   K 4.6 12/05/2016   CL 103 12/05/2016   CALCIUM 9.5 12/05/2016   MG 2.1 12/05/2016                 Neuropathy Markers Lab Results  Component Value Date   VITAMINB12 495 12/05/2016                 Bone Pathology Markers Lab Results  Component Value Date   ALKPHOS 59 12/05/2016   VD25OH 27.1 (L) 09/18/2016   25OHVITD1 49 12/05/2016   25OHVITD2 <1.0 12/05/2016   25OHVITD3 49 12/05/2016   CALCIUM 9.5 12/05/2016                 Coagulation Parameters Lab Results  Component Value Date   INR 0.9 04/16/2013   LABPROT 12.4 04/16/2013   PLT 287 10/02/2016                 Cardiovascular Markers Lab Results  Component Value Date   HGB 13.3 10/02/2016   HCT 40.8 10/02/2016                 Note: Lab results reviewed.  Recent Diagnostic Imaging Review  Dg Si Joints Result Date: 12/05/2016 CLINICAL DATA:  Chronic pain radiating to LEFT hip, back surgery in April 2017 EXAM: BILATERAL SACROILIAC JOINTS - 3+ VIEW COMPARISON:  None FINDINGS: SI joints symmetric and preserved. Prior posterior fusion L4-L5 with BILATERAL pedicle screws/bars and intervening disc prosthesis. Sacrum  unremarkable. Visualized pelvis intact. IMPRESSION:  No acute abnormalities. Prior L4-L5 fusion. Electronically Signed   By: Lavonia Dana M.D.   On: 12/05/2016 14:29   Dg Hip Unilat W Or W/o Pelvis 2-3 Views Left Result Date: 12/05/2016 CLINICAL DATA:  Chronic pain radiating to LEFT hip for couple weeks, no injury, back surgery in April 2017 EXAM: DG HIP (WITH OR WITHOUT PELVIS) 2-3V LEFT COMPARISON:  None FINDINGS: Osseous mineralization grossly normal for technique. Hip and SI joint spaces preserved. No acute fracture, dislocation, or bone destruction. Prior L4-L5 posterior fusion with intervening disc prosthesis. IMPRESSION: No acute osseous abnormalities. Prior L4-L5 posterior fusion. Electronically Signed   By: Lavonia Dana M.D.   On: 12/05/2016 14:28   Lumbosacral Imaging: Lumbar MR wo contrast:  Results for orders placed in visit on 10/21/05  MR L Spine Ltd W/O Cm   Narrative * PRIOR REPORT IMPORTED FROM AN EXTERNAL SYSTEM *   PRIOR REPORT IMPORTED FROM THE [employer] WORKFLOW SYSTEM   REASON FOR EXAM:  Low back pain  COMMENTS:   PROCEDURE:     MR  - MR LUMBAR SPINE WO CONTRAST  - Oct 21 2005  9:31PM   RESULT:   HISTORY:  Low back pain.   PROCEDURE:  There is a central to LEFT paracentral L4-L5 disk protrusion  that results in flattening of the thecal sac to 6 mm.  Neural foramen are  intact.  No bony lesions are identified.  No other focal disk abnormality  is  identified.  Lumbar cord is normal.   IMPRESSION:   L4-L5 central to LEFT paracentral disk protrusion with flattening of the  thecal sac to approximately 5 mm.   Thank you for this opportunity to contribute to the care of your patient.       Lumbar DG 2-3 views:  Results for orders placed during the hospital encounter of 03/25/11  DG Lumbar Spine 2-3 Views Ordered by RMM   Narrative *RADIOLOGY REPORT*  Clinical Data: L5-S1 laminectomy and microdiskectomy.  Reported transitional vertebral anatomy with six lumbar type  vertebrae on prior outside imaging.  LUMBAR SPINE - 2-3 VIEW  Comparison: No similar prior study is available for comparison.  Findings: The most inferior lumbar type vertebral body is labeled L6, as per provided clinical history.  The most superior lumbar type vertebral body is not included in the field of view.  On the initial image obtained at 7:13 p.m., a radiopaque marker projects posterior to the spinous process of L4.  On a second image obtained at 7:35 p.m., radiopaque markers are present over the spinous processes of the L5 and L6 vertebral body levels, respectively.  IMPRESSION: Intraoperative imaging as above.  Specifically, the inferior most lumbar type vertebral body is labeled L6 as per the provided history, as no similar prior imaging is available at this institution for comparison or verification.  Original Report Authenticated By: Arline Asp, M.D.   Sacroiliac Joint Imaging: Sacroiliac Joint DG:  Results for orders placed during the hospital encounter of 12/05/16  DG Si Joints   Narrative CLINICAL DATA:  Chronic pain radiating to LEFT hip, back surgery in April 2017  EXAM: BILATERAL SACROILIAC JOINTS - 3+ VIEW  COMPARISON:  None  FINDINGS: SI joints symmetric and preserved.  Prior posterior fusion L4-L5 with BILATERAL pedicle screws/bars and intervening disc prosthesis.  Sacrum unremarkable.  Visualized pelvis intact.  IMPRESSION: No acute abnormalities.  Prior L4-L5 fusion.   Electronically Signed   By: Lavonia Dana M.D.   On: 12/05/2016 14:29  Hip Imaging: Hip-L DG 2-3 views:  Results for orders placed during the hospital encounter of 12/05/16  DG HIP UNILAT W OR W/O PELVIS 2-3 VIEWS LEFT   Narrative CLINICAL DATA:  Chronic pain radiating to LEFT hip for couple weeks, no injury, back surgery in April 2017  EXAM: DG HIP (WITH OR WITHOUT PELVIS) 2-3V LEFT  COMPARISON:  None  FINDINGS: Osseous mineralization grossly normal  for technique.  Hip and SI joint spaces preserved.  No acute fracture, dislocation, or bone destruction.  Prior L4-L5 posterior fusion with intervening disc prosthesis.  IMPRESSION: No acute osseous abnormalities.  Prior L4-L5 posterior fusion.   Electronically Signed   By: Lavonia Dana M.D.   On: 12/05/2016 14:28    Note: Results of ordered imaging test(s) reviewed and explained to patient in Layman's terms. Copy of results provided to patient  Meds   Current Meds  Medication Sig  . cyclobenzaprine (FLEXERIL) 10 MG tablet Take 1 tablet (10 mg total) by mouth at bedtime.  . DULoxetine (CYMBALTA) 30 MG capsule Take 3 capsules (90 mg total) by mouth daily.  . meloxicam (MOBIC) 15 MG tablet Take 1 tablet (15 mg total) by mouth daily.  . Oxycodone HCl 10 MG TABS Take 1 tablet (10 mg total) by mouth every 6 (six) hours as needed.  . [DISCONTINUED] meloxicam (MOBIC) 15 MG tablet Take 15 mg by mouth daily.    ROS  Constitutional: Denies any fever or chills Gastrointestinal: No reported hemesis, hematochezia, vomiting, or acute GI distress Musculoskeletal: Denies any acute onset joint swelling, redness, loss of ROM, or weakness Neurological: No reported episodes of acute onset apraxia, aphasia, dysarthria, agnosia, amnesia, paralysis, loss of coordination, or loss of consciousness  Allergies  Ms. Gearing is allergic to codeine.  PFSH  Drug: Ms. Cortopassi  reports that she does not use drugs. Alcohol:  reports that she does not drink alcohol. Tobacco:  reports that she has been smoking Cigarettes.  She has a 20.00 pack-year smoking history. She has never used smokeless tobacco. Medical:  has a past medical history of Allergy; Anxiety; Chronic bilateral low back pain (07/22/2016); and Depression. Surgical: Ms. Coderre  has a past surgical history that includes Abdominal hysterectomy; Tonsillectomy; Cesarean section; Spine surgery; Spine surgery (09/09/2015); and Back surgery. Family: family  history includes Arthritis in her father; Cancer in her maternal aunt; Depression in her sister and sister; Diabetes in her mother; Heart disease in her father and maternal grandfather; Hyperlipidemia in her father; Hypertension in her father and mother; Lung disease in her father.  Constitutional Exam  General appearance: Well nourished, well developed, and well hydrated. In no apparent acute distress Vitals:   12/17/16 1000  BP: 119/72  Pulse: 89  Resp: 14  Temp: 98.6 F (37 C)  TempSrc: Oral  SpO2: 100%  Weight: 180 lb (81.6 kg)  Height: _0  (1.676 m)   BMI Assessment: Estimated body mass index is 29.05 kg/m as calculated from the following:   Height as of this encounter: _1  (1.676 m).   Weight as of this encounter: 180 lb (81.6 kg).  BMI interpretation table: BMI level Category Range association with higher incidence of chronic pain  <18 kg/m2 Underweight   18.5-24.9 kg/m2 Ideal body weight   25-29.9 kg/m2 Overweight Increased incidence by 20%  30-34.9 kg/m2 Obese (Class I) Increased incidence by 68%  35-39.9 kg/m2 Severe obesity (Class II) Increased incidence by 136%  >40 kg/m2 Extreme obesity (Class III) Increased incidence by 254%  BMI Readings from Last 4 Encounters:  12/17/16 29.05 kg/m  12/05/16 29.86 kg/m  10/02/16 30.17 kg/m  09/18/16 29.86 kg/m   Wt Readings from Last 4 Encounters:  12/17/16 180 lb (81.6 kg)  12/05/16 185 lb (83.9 kg)  10/02/16 186 lb 14.4 oz (84.8 kg)  09/18/16 185 lb (83.9 kg)  Psych/Mental status: Alert, oriented x 3 (person, place, & time)       Eyes: PERLA Respiratory: No evidence of acute respiratory distress  Cervical Spine Exam  Inspection: No masses, redness, or swelling Alignment: Symmetrical Functional ROM: Unrestricted ROM      Stability: No instability detected Muscle strength & Tone: Functionally intact Sensory: Unimpaired Palpation: No palpable anomalies              Upper Extremity (UE) Exam    Side: Right  upper extremity  Side: Left upper extremity  Inspection: No masses, redness, swelling, or asymmetry. No contractures  Inspection: No masses, redness, swelling, or asymmetry. No contractures  Functional ROM: Unrestricted ROM          Functional ROM: Unrestricted ROM          Muscle strength & Tone: Functionally intact  Muscle strength & Tone: Functionally intact  Sensory: Unimpaired  Sensory: Unimpaired  Palpation: No palpable anomalies              Palpation: No palpable anomalies              Specialized Test(s): Deferred         Specialized Test(s): Deferred          Thoracic Spine Exam  Inspection: No masses, redness, or swelling Alignment: Symmetrical Functional ROM: Unrestricted ROM Stability: No instability detected Sensory: Unimpaired Muscle strength & Tone: No palpable anomalies  Lumbar Spine Exam  Inspection: No masses, redness, or swelling Alignment: Symmetrical Functional ROM: Minimal ROM      Stability: No instability detected Muscle strength & Tone: Functionally intact Sensory: Movement-associated pain Palpation: No palpable anomalies       Provocative Tests: Lumbar Hyperextension and rotation test: Positive bilaterally for facet joint pain. Patrick's Maneuver: Positive for left-sided S-I arthralgia              Gait & Posture Assessment  Ambulation: Limited Gait: Relatively normal for age and body habitus Posture: WNL   Lower Extremity Exam    Side: Right lower extremity  Side: Left lower extremity  Inspection: No masses, redness, swelling, or asymmetry. No contractures  Inspection: No masses, redness, swelling, or asymmetry. No contractures  Functional ROM: Unrestricted ROM          Functional ROM: Unrestricted ROM          Muscle strength & Tone: Functionally intact  Muscle strength & Tone: Functionally intact  Sensory: Unimpaired  Sensory: Unimpaired  Palpation: No palpable anomalies  Palpation: No palpable anomalies   Assessment & Plan  Primary Diagnosis &  Pertinent Problem List: The primary encounter diagnosis was Chronic low back pain (Location of Primary Source of Pain) (midline). Diagnoses of Chronic hip pain (Location of Secondary source of pain) (Left), Lumbar facet syndrome (Bilateral), Lumbar spondylosis, Failed back surgical syndrome, Lumbar central spinal stenosis (L4-5), Lumbar discogenic pain syndrome, Chronic sacroiliac joint pain (Left), Chronic pain syndrome, Lumbosacral spondylosis without myelopathy, Musculoskeletal pain, Long term (current) use of opiate analgesic, Long term prescription opiate use, and Opiate use (85.5 MME/Day) were also pertinent to this visit.  Visit Diagnosis: 1. Chronic low back pain (Location of Primary Source  of Pain) (midline)   2. Chronic hip pain (Location of Secondary source of pain) (Left)   3. Lumbar facet syndrome (Bilateral)   4. Lumbar spondylosis   5. Failed back surgical syndrome   6. Lumbar central spinal stenosis (L4-5)   7. Lumbar discogenic pain syndrome   8. Chronic sacroiliac joint pain (Left)   9. Chronic pain syndrome   10. Lumbosacral spondylosis without myelopathy   11. Musculoskeletal pain   12. Long term (current) use of opiate analgesic   13. Long term prescription opiate use   14. Opiate use (85.5 MME/Day)    Problems updated and reviewed during this visit: No problems updated.  Plan of Care  Pharmacotherapy (Medications Ordered): Meds ordered this encounter  Medications  . Oxycodone HCl 10 MG TABS    Sig: Take 1 tablet (10 mg total) by mouth every 6 (six) hours as needed.    Dispense:  120 tablet    Refill:  0    Do not place this medication, or any other prescription from our practice, on "Automatic Refill". Patient may have prescription filled one day early if pharmacy is closed on scheduled refill date. Do not fill until: 12/17/16 To last until: 01/16/17  . meloxicam (MOBIC) 15 MG tablet    Sig: Take 1 tablet (15 mg total) by mouth daily.    Dispense:  30 tablet     Refill:  0    Do not place medication on "Automatic Refill". Fill one day early if pharmacy is closed on scheduled refill date.  . cyclobenzaprine (FLEXERIL) 10 MG tablet    Sig: Take 1 tablet (10 mg total) by mouth at bedtime.    Dispense:  30 tablet    Refill:  0    Do not place this medication, or any other prescription from our practice, on "Automatic Refill". Patient may have prescription filled one day early if pharmacy is closed on scheduled refill date.   Lab-work, procedure(s), and/or referral(s): Orders Placed This Encounter  Procedures  . LUMBAR FACET(MEDIAL BRANCH NERVE BLOCK) MBNB    Pharmacological management options:  Opioid Analgesics: We'll take over management today. See above orders Membrane stabilizer: We have discussed the possibility of optimizing this mode of therapy, if tolerated Muscle relaxant: We have discussed the possibility of a trial NSAID: We have discussed the possibility of a trial Other analgesic(s): To be determined at a later time   Interventional management options: Planned, scheduled, and/or pending:    Diagnostic bilateral lumbar facet block (NO STEROIDS)   Considering:   Diagnostic bilateral lumbar facet block  Possible bilateral lumbar facet RFA  Diagnostic left sided caudal epidural steroid injection + diagnostic epidurogram Possible left-sided Racz procedure Diagnostic left L4-5 interlaminar lumbar epidural steroid injection  Diagnostic left L4-5 transforaminal epidural steroid injection  Diagnostic left L4 selective nerve root block Possible left L4 nerve root ganglion RFA Possible lumbar spinal cord stimulator trial    PRN Procedures:   To be determined at a later time   Provider-requested follow-up: Return for procedure (w/ sedation), (ASAP), by MD, in addition, Med-Mgmt, (1 mo), w/ MD.  Future Appointments Date Time Provider [place]  12/25/2016 8:00 AM Milinda Pointer, MD [name] None  01/14/2017 1:30 PM  Milinda Pointer, MD [facility] None    Primary Care Physician: Arnetha Courser, MD Location: [facility] Note by: Gaspar Cola, MD Date: 12/17/2016; Time: 11:44 AM  Patient Instructions    ____________________________________________________________________________________________  Medication Rules  Applies to:  All patients receiving prescriptions (written or electronic).  Pharmacy of record: Pharmacy where electronic prescriptions will be sent. If written prescriptions are taken to a different pharmacy, please inform the nursing staff. The pharmacy listed in the electronic medical record should be the one where you would like electronic prescriptions to be sent.  Prescription refills: Only during scheduled appointments. Applies to both, written and electronic prescriptions.  NOTE: The following applies primarily to controlled substances (Opioid* Pain Medications).   Patient's responsibilities: 1. Pain Pills: Bring all pain pills to every appointment (except for procedure appointments). 2. Pill Bottles: Bring pills in original pharmacy bottle. Always bring newest bottle. Bring bottle, even if empty. 3. Medication refills: You are responsible for knowing and keeping track of what medications you need refilled. The day before your appointment, write a list of all prescriptions that need to be refilled. Bring that list to your appointment and give it to the admitting nurse. Prescriptions will be written only during appointments. If you forget a medication, it will not be "Called in", "Faxed", or "electronically sent". You will need to get another appointment to get these prescribed. 4. Prescription Accuracy: You are responsible for carefully inspecting your prescriptions before leaving our office. Have the discharge nurse carefully go over each prescription with you, before taking them home. Make sure that your name is accurately spelled, that your address  is correct. Check the name and dose of your medication to make sure it is accurate. Check the number of pills, and the written instructions to make sure they are clear and accurate. Make sure that you are given enough medication to last until your next medication refill appointment. 5. Taking Medication: Take medication as prescribed. Never take more pills than instructed. Never take medication more frequently than prescribed. Taking less pills or less frequently is permitted and encouraged, when it comes to controlled substances (written prescriptions).  6. Inform other Doctors: Always inform, all of your healthcare providers, of all the medications you take. 7. Pain Medication from other Providers: You are not allowed to accept any additional pain medication from any other Doctor or Healthcare provider. There are two exceptions to this rule. (see below) In the event that you require additional pain medication, you are responsible for notifying us, as stated below. 8. Medication Agreement: You are responsible for carefully reading and following our Medication Agreement. This must be signed before receiving any prescriptions from our practice. Safely store a copy of your signed Agreement. Violations to the Agreement will result in no further prescriptions. (Additional copies of our Medication Agreement are available upon request.) 9. Laws, Rules, & Regulations: All patients are expected to follow all Federal and [employer], [employer], Rules, [employer] [facility]. Ignorance of the Laws does not constitute a valid excuse. The use of any illegal substances is prohibited. 10. Adopted CDC guidelines & recommendations: Target dosing levels will be at or below 60 MME/day. Use of benzodiazepines** is not recommended.  Exceptions: There are only two exceptions to the rule of not receiving pain medications from other Healthcare Providers. 1. Exception #1 (Emergencies): In the event of an emergency (i.e.: accident requiring  emergency care), you are allowed to receive additional pain medication. However, you are responsible for: As soon as you are able, call our office [phone], at any time of the day or night, and leave a message stating your name, the date and nature of the emergency, and the name and dose of the medication prescribed. In the event that your call  is answered by a member of our staff, make sure to document and save the date, time, and the name of the person that took your information.  2. Exception #2 (Planned Surgery): In the event that you are scheduled by another doctor or dentist to have any type of surgery or procedure, you are allowed (for a period no longer than 30 days), to receive additional pain medication, for the acute post-op pain. However, in this case, you are responsible for picking up a copy of our "Post-op Pain Management for Surgeons" handout, and giving it to your surgeon or dentist. This document is available at our office, and does not require an appointment to obtain it. Simply go to our office during business hours (Monday-Thursday from 8:00 AM to 4:00 PM) (Friday 8:00 AM to 12:00 Noon) or if you have a scheduled appointment with [place], prior to your surgery, and ask for it by name. In addition, you will need to provide [place] with your name, name of your surgeon, type of surgery, and date of procedure or surgery.  *Opioid medications include: morphine, codeine, oxycodone, oxymorphone, hydrocodone, hydromorphone, meperidine, tramadol, tapentadol, buprenorphine, fentanyl, methadone. **Benzodiazepine medications include: diazepam (Valium), alprazolam (Xanax), clonazepam (Klonopine), lorazepam (Ativan), clorazepate (Tranxene), chlordiazepoxide (Librium), estazolam (Prosom), oxazepam (Serax), temazepam (Restoril), triazolam  (Halcion)  ____________________________________________________________________________________________ ____________________________________________________________________________________________  Pain Scale  Introduction: The pain score used by this practice is the Verbal Numerical Rating Scale (VNRS-11). This is an 11-point scale. It is for adults and children 10 years or older. There are significant differences in how the pain score is reported, used, and applied. Forget everything you learned in the past and learn this scoring system.  General Information: The scale should reflect your current level of pain. Unless you are specifically asked for the level of your worst pain, or your average pain. If you are asked for one of these two, then it should be understood that it is over the past 24 hours.  Basic Activities of Daily Living (ADL): Personal hygiene, dressing, eating, transferring, and using restroom.  Instructions: Most patients tend to report their level of pain as a combination of two factors, their physical pain and their psychosocial pain. This last one is also known as "suffering" and it is reflection of how physical pain affects you socially and psychologically. From now on, report them separately. From this point on, when asked to report your pain level, report only your physical pain. Use the following table for reference.  Pain Clinic Pain Levels (0-5/10)  Pain Level Score  Description  No Pain 0   Mild pain 1 Nagging, annoying, but does not interfere with basic activities of daily living (ADL). Patients are able to eat, bathe, get dressed, toileting (being able to get on and off the toilet and perform personal hygiene functions), transfer (move in and out of bed or a chair without assistance), and maintain continence (able to control bladder and bowel functions). Blood pressure and heart rate are unaffected. A normal heart rate for a healthy adult ranges from 60 to 100 bpm  (beats per minute).   Mild to moderate pain 2 Noticeable and distracting. Impossible to hide from other people. More frequent flare-ups. Still possible to adapt and function close to normal. It can be very annoying and may have occasional stronger flare-ups. With discipline, patients may get used to it and adapt.   Moderate pain 3 Interferes significantly with activities of daily living (ADL). It becomes difficult to feed, bathe, get dressed, get on and  off the toilet or to perform personal hygiene functions. Difficult to get in and out of bed or a chair without assistance. Very distracting. With effort, it can be ignored when deeply involved in activities.   Moderately severe pain 4 Impossible to ignore for more than a few minutes. With effort, patients may still be able to manage work or participate in some social activities. Very difficult to concentrate. Signs of autonomic nervous system discharge are evident: dilated pupils (mydriasis); mild sweating (diaphoresis); sleep interference. Heart rate becomes elevated (>115 bpm). Diastolic blood pressure (lower number) rises above 100 mmHg. Patients find relief in laying down and not moving.   Severe pain 5 Intense and extremely unpleasant. Associated with frowning face and frequent crying. Pain overwhelms the senses.  Ability to do any activity or maintain social relationships becomes significantly limited. Conversation becomes difficult. Pacing back and forth is common, as getting into a comfortable position is nearly impossible. Pain wakes you up from deep sleep. Physical signs will be obvious: pupillary dilation; increased sweating; goosebumps; brisk reflexes; cold, clammy hands and feet; nausea, vomiting or dry heaves; loss of appetite; significant sleep disturbance with inability to fall asleep or to remain asleep. When persistent, significant weight loss is observed due to the complete loss of appetite and sleep deprivation.  Blood pressure and heart  rate becomes significantly elevated. Caution: If elevated blood pressure triggers a pounding headache associated with blurred vision, then the patient should immediately seek attention at an urgent or emergency care unit, as these may be signs of an impending stroke.    Emergency Department Pain Levels (6-10/10)  Emergency Room Pain 6 Severely limiting. Requires emergency care and should not be seen or managed at an outpatient pain management facility. Communication becomes difficult and requires great effort. Assistance to reach the emergency department may be required. Facial flushing and profuse sweating along with potentially dangerous increases in heart rate and blood pressure will be evident.   Distressing pain 7 Self-care is very difficult. Assistance is required to transport, or use restroom. Assistance to reach the emergency department will be required. Tasks requiring coordination, such as bathing and getting dressed become very difficult.   Disabling pain 8 Self-care is no longer possible. At this level, pain is disabling. The individual is unable to do even the most "basic" activities such as walking, eating, bathing, dressing, transferring to a bed, or toileting. Fine motor skills are lost. It is difficult to think clearly.   Incapacitating pain 9 Pain becomes incapacitating. Thought processing is no longer possible. Difficult to remember your own name. Control of movement and coordination are lost.   The worst pain imaginable 10 At this level, most patients pass out from pain. When this level is reached, collapse of the autonomic nervous system occurs, leading to a sudden drop in blood pressure and heart rate. This in turn results in a temporary and dramatic drop in blood flow to the brain, leading to a loss of consciousness. Fainting is one of the body's self defense mechanisms. Passing out puts the brain in a calmed state and causes it to shut down for a while, in order to begin the  healing process.    Summary: 1. Refer to this scale when providing [place] with your pain level. 2. Be accurate and careful when reporting your pain level. This will help with your care. 3. Over-reporting your pain level will lead to loss of credibility. 4. Even a level of 1/10 means that there is pain and  will be treated at our facility. 5. High, inaccurate reporting will be documented as "Symptom Exaggeration", leading to loss of credibility and suspicions of possible secondary gains such as obtaining more narcotics, or wanting to appear disabled, for fraudulent reasons. 6. Only pain levels of 5 or below will be seen at our facility. 7. Pain levels of 6 and above will be sent to the Emergency Department and the appointment cancelled. ____________________________________________________________________________________________  ____________________________________________________________________________________________  Preparing for Procedure with Sedation Instructions: . Oral Intake: Do not eat or drink anything for at least 8 hours prior to your procedure. . Transportation: Public transportation is not allowed. Bring an adult driver. The driver must be physically present in our waiting room before any procedure can be started. Marland Kitchen Physical Assistance: Bring an adult physically capable of assisting you, in the event you need help. This adult should keep you company at home for at least 6 hours after the procedure. . Blood Pressure Medicine: Take your blood pressure medicine with a sip of water the morning of the procedure. . Blood thinners:  . Diabetics on insulin: Notify the staff so that you can be scheduled 1st case in the morning. If your diabetes requires high dose insulin, take only  of your normal insulin dose the morning of the procedure and notify the staff that you have done so. . Preventing infections: Shower with an antibacterial soap the morning of your procedure. . Build-up your  immune system: Take 1000 mg of Vitamin C with every meal (3 times a day) the day prior to your procedure. Marland Kitchen Antibiotics: Inform the staff if you have a condition or reason that requires you to take antibiotics before dental procedures. . Pregnancy: If you are pregnant, call and cancel the procedure. . Sickness: If you have a cold, fever, or any active infections, call and cancel the procedure. . Arrival: You must be in the facility at least 30 minutes prior to your scheduled procedure. . Children: Do not bring children with you. . Dress appropriately: Bring dark clothing that you would not mind if they get stained. . Valuables: Do not bring any jewelry or valuables. Procedure appointments are reserved for interventional treatments only. Marland Kitchen No Prescription Refills. . No medication changes will be discussed during procedure appointments. . No disability issues will be discussed. ____________________________________________________________________________________________  GENERAL RISKS AND COMPLICATIONS  What are the risk, side effects and possible complications? Generally speaking, most procedures are safe.  However, with any procedure there are risks, side effects, and the possibility of complications.  The risks and complications are dependent upon the sites that are lesioned, or the type of nerve block to be performed.  The closer the procedure is to the spine, the more serious the risks are.  Great care is taken when placing the radio frequency needles, block needles or lesioning probes, but sometimes complications can occur. 1. Infection: Any time there is an injection through the skin, there is a risk of infection.  This is why sterile conditions are used for these blocks.  There are four possible types of infection. 1. Localized skin infection. 2. Central Nervous System Infection-This can be in the form of Meningitis, which can be deadly. 3. Epidural Infections-This can be in the form of an  epidural abscess, which can cause pressure inside of the spine, causing compression of the spinal cord with subsequent paralysis. This would require an emergency surgery to decompress, and there are no guarantees that the patient would recover from the paralysis. 4. Discitis-This is  an infection of the intervertebral discs.  It occurs in about 1% of discography procedures.  It is difficult to treat and it may lead to surgery.        2. Pain: the needles have to go through skin and soft tissues, will cause soreness.       3. Damage to internal structures:  The nerves to be lesioned may be near blood vessels or    other nerves which can be potentially damaged.       4. Bleeding: Bleeding is more common if the patient is taking blood thinners such as  aspirin, Coumadin, Ticiid, Plavix, etc., or if he/she have some genetic predisposition  such as hemophilia. Bleeding into the spinal canal can cause compression of the spinal  cord with subsequent paralysis.  This would require an emergency surgery to  decompress and there are no guarantees that the patient would recover from the  paralysis.       5. Pneumothorax:  Puncturing of a lung is a possibility, every time a needle is introduced in  the area of the chest or upper back.  Pneumothorax refers to free air around the  collapsed lung(s), inside of the thoracic cavity (chest cavity).  Another two possible  complications related to a similar event would include: Hemothorax and Chylothorax.   These are variations of the Pneumothorax, where instead of air around the collapsed  lung(s), you may have blood or chyle, respectively.       6. Spinal headaches: They may occur with any procedures in the area of the spine.       7. Persistent CSF (Cerebro-Spinal Fluid) leakage: This is a rare problem, but may occur  with prolonged intrathecal or epidural catheters either due to the formation of a fistulous  track or a dural tear.       8. Nerve damage: By working so close  to the spinal cord, there is always a possibility of  nerve damage, which could be as serious as a permanent spinal cord injury with  paralysis.       9. Death:  Although rare, severe deadly allergic reactions known as "Anaphylactic  reaction" can occur to any of the medications used.      10. Worsening of the symptoms:  We can always make thing worse.  What are the chances of something like this happening? Chances of any of this occuring are extremely low.  By statistics, you have more of a chance of getting killed in a motor vehicle accident: while driving to the hospital than any of the above occurring .  Nevertheless, you should be aware that they are possibilities.  In general, it is similar to taking a shower.  Everybody knows that you can slip, hit your head and get killed.  Does that mean that you should not shower again?  Nevertheless always keep in mind that statistics do not mean anything if you happen to be on the wrong side of them.  Even if a procedure has a 1 (one) in a 1,000,000 (million) chance of going wrong, it you happen to be that one..Also, keep in mind that by statistics, you have more of a chance of having something go wrong when taking medications.  Who should not have this procedure? If you are on a blood thinning medication (e.g. Coumadin, Plavix, see list of "Blood Thinners"), or if you have an active infection going on, you should not have the procedure.  If you are taking any  blood thinners, please inform your physician.  How should I prepare for this procedure?  Do not eat or drink anything at least six hours prior to the procedure.  Bring a driver with you .  It cannot be a taxi.  Come accompanied by an adult that can drive you back, and that is strong enough to help you if your legs get weak or numb from the local anesthetic.  Take all of your medicines the morning of the procedure with just enough water to swallow them.  If you have diabetes, make sure that you  are scheduled to have your procedure done first thing in the morning, whenever possible.  If you have diabetes, take only half of your insulin dose and notify our nurse that you have done so as soon as you arrive at the clinic.  If you are diabetic, but only take blood sugar pills (oral hypoglycemic), then do not take them on the morning of your procedure.  You may take them after you have had the procedure.  Do not take aspirin or any aspirin-containing medications, at least eleven (11) days prior to the procedure.  They may prolong bleeding.  Wear loose fitting clothing that may be easy to take off and that you would not mind if it got stained with Betadine or blood.  Do not wear any jewelry or perfume  Remove any nail coloring.  It will interfere with some of our monitoring equipment.  NOTE: Remember that this is not meant to be interpreted as a complete list of all possible complications.  Unforeseen problems may occur.  BLOOD THINNERS The following drugs contain aspirin or other products, which can cause increased bleeding during surgery and should not be taken for 2 weeks prior to and 1 week after surgery.  If you should need take something for relief of minor pain, you may take acetaminophen which is found in Tylenol,m Datril, Anacin-3 and Panadol. It is not blood thinner. The products listed below are.  Do not take any of the products listed below in addition to any listed on your instruction sheet.  A.P.C or A.P.C with Codeine Codeine Phosphate Capsules #3 Ibuprofen Ridaura  ABC compound Congesprin Imuran rimadil  Advil Cope Indocin Robaxisal  Alka-Seltzer Effervescent Pain Reliever and Antacid Coricidin or Coricidin-D  Indomethacin Rufen  Alka-Seltzer plus Cold Medicine Cosprin Ketoprofen S-A-C Tablets  Anacin Analgesic Tablets or Capsules Coumadin Korlgesic Salflex  Anacin Extra Strength Analgesic tablets or capsules CP-2 Tablets Lanoril Salicylate  Anaprox Cuprimine Capsules  Levenox Salocol  Anexsia-D Dalteparin Magan Salsalate  Anodynos Darvon compound Magnesium Salicylate Sine-off  Ansaid Dasin Capsules Magsal Sodium Salicylate  Anturane Depen Capsules Marnal Soma  APF Arthritis pain formula Dewitt's Pills Measurin Stanback  Argesic Dia-Gesic Meclofenamic Sulfinpyrazone  Arthritis Bayer Timed Release Aspirin Diclofenac Meclomen Sulindac  Arthritis pain formula Anacin Dicumarol Medipren Supac  Analgesic (Safety coated) Arthralgen Diffunasal Mefanamic Suprofen  Arthritis Strength Bufferin Dihydrocodeine Mepro Compound Suprol  Arthropan liquid Dopirydamole Methcarbomol with Aspirin Synalgos  ASA tablets/Enseals Disalcid Micrainin Tagament  Ascriptin Doan's Midol Talwin  Ascriptin A/D Dolene Mobidin Tanderil  Ascriptin Extra Strength Dolobid Moblgesic Ticlid  Ascriptin with Codeine Doloprin or Doloprin with Codeine Momentum Tolectin  Asperbuf Duoprin Mono-gesic Trendar  Aspergum Duradyne Motrin or Motrin IB Triminicin  Aspirin plain, buffered or enteric coated Durasal Myochrisine Trigesic  Aspirin Suppositories Easprin Nalfon Trillsate  Aspirin with Codeine Ecotrin Regular or Extra Strength Naprosyn Uracel  Atromid-S Efficin Naproxen Ursinus  Auranofin Capsules Elmiron Neocylate Vanquish  Axotal Emagrin Norgesic Verin  Azathioprine Empirin or Empirin with Codeine Normiflo Vitamin E  Azolid Emprazil Nuprin Voltaren  Bayer Aspirin plain, buffered or children's or timed BC Tablets or powders Encaprin Orgaran Warfarin Sodium  Buff-a-Comp Enoxaparin Orudis Zorpin  Buff-a-Comp with Codeine Equegesic Os-Cal-Gesic   Buffaprin Excedrin plain, buffered or Extra Strength Oxalid   Bufferin Arthritis Strength Feldene Oxphenbutazone   Bufferin plain or Extra Strength Feldene Capsules Oxycodone with Aspirin   Bufferin with Codeine Fenoprofen Fenoprofen Pabalate or Pabalate-SF   Buffets II Flogesic Panagesic   Buffinol plain or Extra Strength Florinal or Florinal with  Codeine Panwarfarin   Buf-Tabs Flurbiprofen Penicillamine   Butalbital Compound Four-way cold tablets Penicillin   Butazolidin Fragmin Pepto-Bismol   Carbenicillin Geminisyn Percodan   Carna Arthritis Reliever Geopen Persantine   Carprofen Gold's salt Persistin   Chloramphenicol Goody's Phenylbutazone   Chloromycetin Haltrain Piroxlcam   Clmetidine heparin Plaquenil   Cllnoril Hyco-pap Ponstel   Clofibrate Hydroxy chloroquine Propoxyphen         Before stopping any of these medications, be sure to consult the physician who ordered them.  Some, such as Coumadin (Warfarin) are ordered to prevent or treat serious conditions such as "deep thrombosis", "pumonary embolisms", and other heart problems.  The amount of time that you may need off of the medication may also vary with the medication and the reason for which you were taking it.  If you are taking any of these medications, please make sure you notify your pain physician before you undergo any procedures.         Facet Blocks Patient Information  Description: The facets are joints in the spine between the vertebrae.  Like any joints in the body, facets can become irritated and painful.  Arthritis can also effect the facets.  By injecting steroids and local anesthetic in and around these joints, we can temporarily block the nerve supply to them.  Steroids act directly on irritated nerves and tissues to reduce selling and inflammation which often leads to decreased pain.  Facet blocks may be done anywhere along the spine from the neck to the low back depending upon the location of your pain.   After numbing the skin with local anesthetic (like Novocaine), a small needle is passed onto the facet joints under x-Jeanlouis guidance.  You may experience a sensation of pressure while this is being done.  The entire block usually lasts about 15-25 minutes.   Conditions which may be treated by facet blocks:   Low back/buttock pain  Neck/shoulder  pain  Certain types of headaches  Preparation for the injection:  1. Do not eat any solid food or dairy products within 8 hours of your appointment. 2. You may drink clear liquid up to 3 hours before appointment.  Clear liquids include water, black coffee, juice or soda.  No milk or cream please. 3. You may take your regular medication, including pain medications, with a sip of water before your appointment.  Diabetics should hold regular insulin (if taken separately) and take 1/2 normal NPH dose the morning of the procedure.  Carry some sugar containing items with you to your appointment. 4. A driver must accompany you and be prepared to drive you home after your procedure. 5. Bring all your current medications with you. 6. An IV may be inserted and sedation may be given at the discretion of the physician. 7. A blood pressure cuff, EKG and other monitors will often be applied during the procedure.  Some patients may need to have extra oxygen administered for a short period. 8. You will be asked to provide medical information, including your allergies and medications, prior to the procedure.  We must know immediately if you are taking blood thinners (like Coumadin/Warfarin) or if you are allergic to IV iodine contrast (dye).  We must know if you could possible be pregnant.  Possible side-effects:   Bleeding from needle site  Infection (rare, may require surgery)  Nerve injury (rare)  Numbness & tingling (temporary)  Difficulty urinating (rare, temporary)  Spinal headache (a headache worse with upright posture)  Light-headedness (temporary)  Pain at injection site (serveral days)  Decreased blood pressure (rare, temporary)  Weakness in arm/leg (temporary)  Pressure sensation in back/neck (temporary)   Call if you experience:   Fever/chills associated with headache or increased back/neck pain  Headache worsened by an upright position  New onset, weakness or numbness of  an extremity below the injection site  Hives or difficulty breathing (go to the emergency room)  Inflammation or drainage at the injection site(s)  Severe back/neck pain greater than usual  New symptoms which are concerning to you  Please note:  Although the local anesthetic injected can often make your back or neck feel good for several hours after the injection, the pain will likely return. It takes 3-7 days for steroids to work.  You may not notice any pain relief for at least one week.  If effective, we will often do a series of 2-3 injections spaced 3-6 weeks apart to maximally decrease your pain.  After the initial series, you may be a candidate for a more permanent nerve block of the facets.  If you have any questions, please call #[phone]) [facility]

## 2016-12-17 ENCOUNTER — Ambulatory Visit: Payer: Managed Care, Other (non HMO) | Attending: Pain Medicine | Admitting: Pain Medicine

## 2016-12-17 ENCOUNTER — Encounter: Payer: Self-pay | Admitting: Pain Medicine

## 2016-12-17 VITALS — BP 119/72 | HR 89 | Temp 98.6°F | Resp 14 | Ht 66.0 in | Wt 180.0 lb

## 2016-12-17 DIAGNOSIS — M47816 Spondylosis without myelopathy or radiculopathy, lumbar region: Secondary | ICD-10-CM | POA: Diagnosis not present

## 2016-12-17 DIAGNOSIS — M961 Postlaminectomy syndrome, not elsewhere classified: Secondary | ICD-10-CM | POA: Diagnosis not present

## 2016-12-17 DIAGNOSIS — M48061 Spinal stenosis, lumbar region without neurogenic claudication: Secondary | ICD-10-CM | POA: Diagnosis not present

## 2016-12-17 DIAGNOSIS — F1190 Opioid use, unspecified, uncomplicated: Secondary | ICD-10-CM

## 2016-12-17 DIAGNOSIS — M25552 Pain in left hip: Secondary | ICD-10-CM | POA: Insufficient documentation

## 2016-12-17 DIAGNOSIS — M533 Sacrococcygeal disorders, not elsewhere classified: Secondary | ICD-10-CM | POA: Insufficient documentation

## 2016-12-17 DIAGNOSIS — M5126 Other intervertebral disc displacement, lumbar region: Secondary | ICD-10-CM

## 2016-12-17 DIAGNOSIS — M546 Pain in thoracic spine: Secondary | ICD-10-CM | POA: Diagnosis present

## 2016-12-17 DIAGNOSIS — G8929 Other chronic pain: Secondary | ICD-10-CM | POA: Diagnosis not present

## 2016-12-17 DIAGNOSIS — G894 Chronic pain syndrome: Secondary | ICD-10-CM

## 2016-12-17 DIAGNOSIS — E559 Vitamin D deficiency, unspecified: Secondary | ICD-10-CM | POA: Insufficient documentation

## 2016-12-17 DIAGNOSIS — Z79891 Long term (current) use of opiate analgesic: Secondary | ICD-10-CM | POA: Insufficient documentation

## 2016-12-17 DIAGNOSIS — M47817 Spondylosis without myelopathy or radiculopathy, lumbosacral region: Secondary | ICD-10-CM | POA: Diagnosis not present

## 2016-12-17 DIAGNOSIS — M791 Myalgia: Secondary | ICD-10-CM | POA: Diagnosis not present

## 2016-12-17 DIAGNOSIS — M545 Low back pain: Secondary | ICD-10-CM | POA: Diagnosis present

## 2016-12-17 DIAGNOSIS — F4321 Adjustment disorder with depressed mood: Secondary | ICD-10-CM | POA: Insufficient documentation

## 2016-12-17 DIAGNOSIS — M4696 Unspecified inflammatory spondylopathy, lumbar region: Secondary | ICD-10-CM

## 2016-12-17 DIAGNOSIS — Z981 Arthrodesis status: Secondary | ICD-10-CM | POA: Insufficient documentation

## 2016-12-17 DIAGNOSIS — M7918 Myalgia, other site: Secondary | ICD-10-CM | POA: Insufficient documentation

## 2016-12-17 DIAGNOSIS — M5442 Lumbago with sciatica, left side: Secondary | ICD-10-CM | POA: Diagnosis not present

## 2016-12-17 MED ORDER — MELOXICAM 15 MG PO TABS: 15.0000 mg | ORAL_TABLET | Freq: Every day | ORAL | 0 refills | Status: DC

## 2016-12-17 MED ORDER — CYCLOBENZAPRINE HCL 10 MG PO TABS: 10.0000 mg | ORAL_TABLET | Freq: Every day | ORAL | 0 refills | Status: DC

## 2016-12-17 MED ORDER — OXYCODONE HCL 10 MG PO TABS: 10.0000 mg | ORAL_TABLET | Freq: Four times a day (QID) | ORAL | 0 refills | Status: DC | PRN

## 2016-12-17 NOTE — Patient Instructions (Addendum)
____________________________________________________________________________________________  Medication Rules  Applies to: All patients receiving prescriptions (written or electronic).  Pharmacy of record: Pharmacy where electronic prescriptions will be sent. If written prescriptions are taken to a different pharmacy, please inform the nursing staff. The pharmacy listed in the electronic medical record should be the one where you would like electronic prescriptions to be sent.  Prescription refills: Only during scheduled appointments. Applies to both, written and electronic prescriptions.  NOTE: The following applies primarily to controlled substances (Opioid* Pain Medications).   Patient's responsibilities: 1. Pain Pills: Bring all pain pills to every appointment (except for procedure appointments). 2. Pill Bottles: Bring pills in original pharmacy bottle. Always bring newest bottle. Bring bottle, even if empty. 3. Medication refills: You are responsible for knowing and keeping track of what medications you need refilled. The day before your appointment, write a list of all prescriptions that need to be refilled. Bring that list to your appointment and give it to the admitting nurse. Prescriptions will be written only during appointments. If you forget a medication, it will not be "Called in", "Faxed", or "electronically sent". You will need to get another appointment to get these prescribed. 4. Prescription Accuracy: You are responsible for carefully inspecting your prescriptions before leaving our office. Have the discharge nurse carefully go over each prescription with you, before taking them home. Make sure that your name is accurately spelled, that your address is correct. Check the name and dose of your medication to make sure it is accurate. Check the number of pills, and the written instructions to make sure they are clear and accurate. Make sure that you are given enough medication to  last until your next medication refill appointment. 5. Taking Medication: Take medication as prescribed. Never take more pills than instructed. Never take medication more frequently than prescribed. Taking less pills or less frequently is permitted and encouraged, when it comes to controlled substances (written prescriptions).  6. Inform other Doctors: Always inform, all of your healthcare providers, of all the medications you take. 7. Pain Medication from other Providers: You are not allowed to accept any additional pain medication from any other Doctor or Healthcare provider. There are two exceptions to this rule. (see below) In the event that you require additional pain medication, you are responsible for notifying us, as stated below. 8. Medication Agreement: You are responsible for carefully reading and following our Medication Agreement. This must be signed before receiving any prescriptions from our practice. Safely store a copy of your signed Agreement. Violations to the Agreement will result in no further prescriptions. (Additional copies of our Medication Agreement are available upon request.) 9. Laws, Rules, & Regulations: All patients are expected to follow all Federal and State Laws, Statutes, Rules, & Regulations. Ignorance of the Laws does not constitute a valid excuse. The use of any illegal substances is prohibited. 10. Adopted CDC guidelines & recommendations: Target dosing levels will be at or below 60 MME/day. Use of benzodiazepines** is not recommended.  Exceptions: There are only two exceptions to the rule of not receiving pain medications from other Healthcare Providers. 1. Exception #1 (Emergencies): In the event of an emergency (i.e.: accident requiring emergency care), you are allowed to receive additional pain medication. However, you are responsible for: As soon as you are able, call our office [phone], at any time of the day or night, and leave a message stating your  name, the date and nature of the emergency, and the name and dose of the medication   prescribed. In the event that your call is answered by a member of our staff, make sure to document and save the date, time, and the name of the person that took your information.  2. Exception #2 (Planned Surgery): In the event that you are scheduled by another doctor or dentist to have any type of surgery or procedure, you are allowed (for a period no longer than 30 days), to receive additional pain medication, for the acute post-op pain. However, in this case, you are responsible for picking up a copy of our "Post-op Pain Management for Surgeons" handout, and giving it to your surgeon or dentist. This document is available at our office, and does not require an appointment to obtain it. Simply go to our office during business hours (Monday-Thursday from 8:00 AM to 4:00 PM) (Friday 8:00 AM to 12:00 Noon) or if you have a scheduled appointment with [place], prior to your surgery, and ask for it by name. In addition, you will need to provide [place] with your name, name of your surgeon, type of surgery, and date of procedure or surgery.  *Opioid medications include: morphine, codeine, oxycodone, oxymorphone, hydrocodone, hydromorphone, meperidine, tramadol, tapentadol, buprenorphine, fentanyl, methadone. **Benzodiazepine medications include: diazepam (Valium), alprazolam (Xanax), clonazepam (Klonopine), lorazepam (Ativan), clorazepate (Tranxene), chlordiazepoxide (Librium), estazolam (Prosom), oxazepam (Serax), temazepam (Restoril), triazolam (Halcion)  ____________________________________________________________________________________________ ____________________________________________________________________________________________  Pain Scale  Introduction: The pain score used by this practice is the Verbal Numerical Rating Scale (VNRS-11). This is an 11-point scale. It is for adults and children 10 years or older. There are  significant differences in how the pain score is reported, used, and applied. Forget everything you learned in the past and learn this scoring system.  General Information: The scale should reflect your current level of pain. Unless you are specifically asked for the level of your worst pain, or your average pain. If you are asked for one of these two, then it should be understood that it is over the past 24 hours.  Basic Activities of Daily Living (ADL): Personal hygiene, dressing, eating, transferring, and using restroom.  Instructions: Most patients tend to report their level of pain as a combination of two factors, their physical pain and their psychosocial pain. This last one is also known as "suffering" and it is reflection of how physical pain affects you socially and psychologically. From now on, report them separately. From this point on, when asked to report your pain level, report only your physical pain. Use the following table for reference.  Pain Clinic Pain Levels (0-5/10)  Pain Level Score  Description  No Pain 0   Mild pain 1 Nagging, annoying, but does not interfere with basic activities of daily living (ADL). Patients are able to eat, bathe, get dressed, toileting (being able to get on and off the toilet and perform personal hygiene functions), transfer (move in and out of bed or a chair without assistance), and maintain continence (able to control bladder and bowel functions). Blood pressure and heart rate are unaffected. A normal heart rate for a healthy adult ranges from 60 to 100 bpm (beats per minute).   Mild to moderate pain 2 Noticeable and distracting. Impossible to hide from other people. More frequent flare-ups. Still possible to adapt and function close to normal. It can be very annoying and may have occasional stronger flare-ups. With discipline, patients may get used to it and adapt.   Moderate pain 3 Interferes significantly with activities of daily living (ADL). It  becomes difficult to  feed, bathe, get dressed, get on and off the toilet or to perform personal hygiene functions. Difficult to get in and out of bed or a chair without assistance. Very distracting. With effort, it can be ignored when deeply involved in activities.   Moderately severe pain 4 Impossible to ignore for more than a few minutes. With effort, patients may still be able to manage work or participate in some social activities. Very difficult to concentrate. Signs of autonomic nervous system discharge are evident: dilated pupils (mydriasis); mild sweating (diaphoresis); sleep interference. Heart rate becomes elevated (>115 bpm). Diastolic blood pressure (lower number) rises above 100 mmHg. Patients find relief in laying down and not moving.   Severe pain 5 Intense and extremely unpleasant. Associated with frowning face and frequent crying. Pain overwhelms the senses.  Ability to do any activity or maintain social relationships becomes significantly limited. Conversation becomes difficult. Pacing back and forth is common, as getting into a comfortable position is nearly impossible. Pain wakes you up from deep sleep. Physical signs will be obvious: pupillary dilation; increased sweating; goosebumps; brisk reflexes; cold, clammy hands and feet; nausea, vomiting or dry heaves; loss of appetite; significant sleep disturbance with inability to fall asleep or to remain asleep. When persistent, significant weight loss is observed due to the complete loss of appetite and sleep deprivation.  Blood pressure and heart rate becomes significantly elevated. Caution: If elevated blood pressure triggers a pounding headache associated with blurred vision, then the patient should immediately seek attention at an urgent or emergency care unit, as these may be signs of an impending stroke.    Emergency Department Pain Levels (6-10/10)  Emergency Room Pain 6 Severely limiting. Requires emergency care and should not be  seen or managed at an outpatient pain management facility. Communication becomes difficult and requires great effort. Assistance to reach the emergency department may be required. Facial flushing and profuse sweating along with potentially dangerous increases in heart rate and blood pressure will be evident.   Distressing pain 7 Self-care is very difficult. Assistance is required to transport, or use restroom. Assistance to reach the emergency department will be required. Tasks requiring coordination, such as bathing and getting dressed become very difficult.   Disabling pain 8 Self-care is no longer possible. At this level, pain is disabling. The individual is unable to do even the most "basic" activities such as walking, eating, bathing, dressing, transferring to a bed, or toileting. Fine motor skills are lost. It is difficult to think clearly.   Incapacitating pain 9 Pain becomes incapacitating. Thought processing is no longer possible. Difficult to remember your own name. Control of movement and coordination are lost.   The worst pain imaginable 10 At this level, most patients pass out from pain. When this level is reached, collapse of the autonomic nervous system occurs, leading to a sudden drop in blood pressure and heart rate. This in turn results in a temporary and dramatic drop in blood flow to the brain, leading to a loss of consciousness. Fainting is one of the body's self defense mechanisms. Passing out puts the brain in a calmed state and causes it to shut down for a while, in order to begin the healing process.    Summary: 1. Refer to this scale when providing [place] with your pain level. 2. Be accurate and careful when reporting your pain level. This will help with your care. 3. Over-reporting your pain level will lead to loss of credibility. 4. Even a level of 1/10  means that there is pain and will be treated at our facility. 5. High, inaccurate reporting will be documented as "Symptom  Exaggeration", leading to loss of credibility and suspicions of possible secondary gains such as obtaining more narcotics, or wanting to appear disabled, for fraudulent reasons. 6. Only pain levels of 5 or below will be seen at our facility. 7. Pain levels of 6 and above will be sent to the Emergency Department and the appointment cancelled. ____________________________________________________________________________________________  ____________________________________________________________________________________________  Preparing for Procedure with Sedation Instructions: . Oral Intake: Do not eat or drink anything for at least 8 hours prior to your procedure. . Transportation: Public transportation is not allowed. Bring an adult driver. The driver must be physically present in our waiting room before any procedure can be started. Marland Kitchen Physical Assistance: Bring an adult physically capable of assisting you, in the event you need help. This adult should keep you company at home for at least 6 hours after the procedure. . Blood Pressure Medicine: Take your blood pressure medicine with a sip of water the morning of the procedure. . Blood thinners:  . Diabetics on insulin: Notify the staff so that you can be scheduled 1st case in the morning. If your diabetes requires high dose insulin, take only  of your normal insulin dose the morning of the procedure and notify the staff that you have done so. . Preventing infections: Shower with an antibacterial soap the morning of your procedure. . Build-up your immune system: Take 1000 mg of Vitamin C with every meal (3 times a day) the day prior to your procedure. Marland Kitchen Antibiotics: Inform the staff if you have a condition or reason that requires you to take antibiotics before dental procedures. . Pregnancy: If you are pregnant, call and cancel the procedure. . Sickness: If you have a cold, fever, or any active infections, call and cancel the  procedure. . Arrival: You must be in the facility at least 30 minutes prior to your scheduled procedure. . Children: Do not bring children with you. . Dress appropriately: Bring dark clothing that you would not mind if they get stained. . Valuables: Do not bring any jewelry or valuables. Procedure appointments are reserved for interventional treatments only. Marland Kitchen No Prescription Refills. . No medication changes will be discussed during procedure appointments. . No disability issues will be discussed. ____________________________________________________________________________________________  GENERAL RISKS AND COMPLICATIONS  What are the risk, side effects and possible complications? Generally speaking, most procedures are safe.  However, with any procedure there are risks, side effects, and the possibility of complications.  The risks and complications are dependent upon the sites that are lesioned, or the type of nerve block to be performed.  The closer the procedure is to the spine, the more serious the risks are.  Great care is taken when placing the radio frequency needles, block needles or lesioning probes, but sometimes complications can occur. 1. Infection: Any time there is an injection through the skin, there is a risk of infection.  This is why sterile conditions are used for these blocks.  There are four possible types of infection. 1. Localized skin infection. 2. Central Nervous System Infection-This can be in the form of Meningitis, which can be deadly. 3. Epidural Infections-This can be in the form of an epidural abscess, which can cause pressure inside of the spine, causing compression of the spinal cord with subsequent paralysis. This would require an emergency surgery to decompress, and there are no guarantees that the patient would recover  from the paralysis. 4. Discitis-This is an infection of the intervertebral discs.  It occurs in about 1% of discography procedures.  It is  difficult to treat and it may lead to surgery.        2. Pain: the needles have to go through skin and soft tissues, will cause soreness.       3. Damage to internal structures:  The nerves to be lesioned may be near blood vessels or    other nerves which can be potentially damaged.       4. Bleeding: Bleeding is more common if the patient is taking blood thinners such as  aspirin, Coumadin, Ticiid, Plavix, etc., or if he/she have some genetic predisposition  such as hemophilia. Bleeding into the spinal canal can cause compression of the spinal  cord with subsequent paralysis.  This would require an emergency surgery to  decompress and there are no guarantees that the patient would recover from the  paralysis.       5. Pneumothorax:  Puncturing of a lung is a possibility, every time a needle is introduced in  the area of the chest or upper back.  Pneumothorax refers to free air around the  collapsed lung(s), inside of the thoracic cavity (chest cavity).  Another two possible  complications related to a similar event would include: Hemothorax and Chylothorax.   These are variations of the Pneumothorax, where instead of air around the collapsed  lung(s), you may have blood or chyle, respectively.       6. Spinal headaches: They may occur with any procedures in the area of the spine.       7. Persistent CSF (Cerebro-Spinal Fluid) leakage: This is a rare problem, but may occur  with prolonged intrathecal or epidural catheters either due to the formation of a fistulous  track or a dural tear.       8. Nerve damage: By working so close to the spinal cord, there is always a possibility of  nerve damage, which could be as serious as a permanent spinal cord injury with  paralysis.       9. Death:  Although rare, severe deadly allergic reactions known as "Anaphylactic  reaction" can occur to any of the medications used.      10. Worsening of the symptoms:  We can always make thing worse.  What are the chances of  something like this happening? Chances of any of this occuring are extremely low.  By statistics, you have more of a chance of getting killed in a motor vehicle accident: while driving to the hospital than any of the above occurring .  Nevertheless, you should be aware that they are possibilities.  In general, it is similar to taking a shower.  Everybody knows that you can slip, hit your head and get killed.  Does that mean that you should not shower again?  Nevertheless always keep in mind that statistics do not mean anything if you happen to be on the wrong side of them.  Even if a procedure has a 1 (one) in a 1,000,000 (million) chance of going wrong, it you happen to be that one..Also, keep in mind that by statistics, you have more of a chance of having something go wrong when taking medications.  Who should not have this procedure? If you are on a blood thinning medication (e.g. Coumadin, Plavix, see list of "Blood Thinners"), or if you have an active infection going on, you should not have the  procedure.  If you are taking any blood thinners, please inform your physician.  How should I prepare for this procedure?  Do not eat or drink anything at least six hours prior to the procedure.  Bring a driver with you .  It cannot be a taxi.  Come accompanied by an adult that can drive you back, and that is strong enough to help you if your legs get weak or numb from the local anesthetic.  Take all of your medicines the morning of the procedure with just enough water to swallow them.  If you have diabetes, make sure that you are scheduled to have your procedure done first thing in the morning, whenever possible.  If you have diabetes, take only half of your insulin dose and notify our nurse that you have done so as soon as you arrive at the clinic.  If you are diabetic, but only take blood sugar pills (oral hypoglycemic), then do not take them on the morning of your procedure.  You may take them  after you have had the procedure.  Do not take aspirin or any aspirin-containing medications, at least eleven (11) days prior to the procedure.  They may prolong bleeding.  Wear loose fitting clothing that may be easy to take off and that you would not mind if it got stained with Betadine or blood.  Do not wear any jewelry or perfume  Remove any nail coloring.  It will interfere with some of our monitoring equipment.  NOTE: Remember that this is not meant to be interpreted as a complete list of all possible complications.  Unforeseen problems may occur.  BLOOD THINNERS The following drugs contain aspirin or other products, which can cause increased bleeding during surgery and should not be taken for 2 weeks prior to and 1 week after surgery.  If you should need take something for relief of minor pain, you may take acetaminophen which is found in Tylenol,m Datril, Anacin-3 and Panadol. It is not blood thinner. The products listed below are.  Do not take any of the products listed below in addition to any listed on your instruction sheet.  A.P.C or A.P.C with Codeine Codeine Phosphate Capsules #3 Ibuprofen Ridaura  ABC compound Congesprin Imuran rimadil  Advil Cope Indocin Robaxisal  Alka-Seltzer Effervescent Pain Reliever and Antacid Coricidin or Coricidin-D  Indomethacin Rufen  Alka-Seltzer plus Cold Medicine Cosprin Ketoprofen S-A-C Tablets  Anacin Analgesic Tablets or Capsules Coumadin Korlgesic Salflex  Anacin Extra Strength Analgesic tablets or capsules CP-2 Tablets Lanoril Salicylate  Anaprox Cuprimine Capsules Levenox Salocol  Anexsia-D Dalteparin Magan Salsalate  Anodynos Darvon compound Magnesium Salicylate Sine-off  Ansaid Dasin Capsules Magsal Sodium Salicylate  Anturane Depen Capsules Marnal Soma  APF Arthritis pain formula Dewitt's Pills Measurin Stanback  Argesic Dia-Gesic Meclofenamic Sulfinpyrazone  Arthritis Bayer Timed Release Aspirin Diclofenac Meclomen Sulindac   Arthritis pain formula Anacin Dicumarol Medipren Supac  Analgesic (Safety coated) Arthralgen Diffunasal Mefanamic Suprofen  Arthritis Strength Bufferin Dihydrocodeine Mepro Compound Suprol  Arthropan liquid Dopirydamole Methcarbomol with Aspirin Synalgos  ASA tablets/Enseals Disalcid Micrainin Tagament  Ascriptin Doan's Midol Talwin  Ascriptin A/D Dolene Mobidin Tanderil  Ascriptin Extra Strength Dolobid Moblgesic Ticlid  Ascriptin with Codeine Doloprin or Doloprin with Codeine Momentum Tolectin  Asperbuf Duoprin Mono-gesic Trendar  Aspergum Duradyne Motrin or Motrin IB Triminicin  Aspirin plain, buffered or enteric coated Durasal Myochrisine Trigesic  Aspirin Suppositories Easprin Nalfon Trillsate  Aspirin with Codeine Ecotrin Regular or Extra Strength Naprosyn Uracel  Atromid-S Efficin Naproxen  Ursinus  Auranofin Capsules Elmiron Neocylate Vanquish  Axotal Emagrin Norgesic Verin  Azathioprine Empirin or Empirin with Codeine Normiflo Vitamin E  Azolid Emprazil Nuprin Voltaren  Bayer Aspirin plain, buffered or children's or timed BC Tablets or powders Encaprin Orgaran Warfarin Sodium  Buff-a-Comp Enoxaparin Orudis Zorpin  Buff-a-Comp with Codeine Equegesic Os-Cal-Gesic   Buffaprin Excedrin plain, buffered or Extra Strength Oxalid   Bufferin Arthritis Strength Feldene Oxphenbutazone   Bufferin plain or Extra Strength Feldene Capsules Oxycodone with Aspirin   Bufferin with Codeine Fenoprofen Fenoprofen Pabalate or Pabalate-SF   Buffets II Flogesic Panagesic   Buffinol plain or Extra Strength Florinal or Florinal with Codeine Panwarfarin   Buf-Tabs Flurbiprofen Penicillamine   Butalbital Compound Four-way cold tablets Penicillin   Butazolidin Fragmin Pepto-Bismol   Carbenicillin Geminisyn Percodan   Carna Arthritis Reliever Geopen Persantine   Carprofen Gold's salt Persistin   Chloramphenicol Goody's Phenylbutazone   Chloromycetin Haltrain Piroxlcam   Clmetidine heparin Plaquenil    Cllnoril Hyco-pap Ponstel   Clofibrate Hydroxy chloroquine Propoxyphen         Before stopping any of these medications, be sure to consult the physician who ordered them.  Some, such as Coumadin (Warfarin) are ordered to prevent or treat serious conditions such as "deep thrombosis", "pumonary embolisms", and other heart problems.  The amount of time that you may need off of the medication may also vary with the medication and the reason for which you were taking it.  If you are taking any of these medications, please make sure you notify your pain physician before you undergo any procedures.         Facet Blocks Patient Information  Description: The facets are joints in the spine between the vertebrae.  Like any joints in the body, facets can become irritated and painful.  Arthritis can also effect the facets.  By injecting steroids and local anesthetic in and around these joints, we can temporarily block the nerve supply to them.  Steroids act directly on irritated nerves and tissues to reduce selling and inflammation which often leads to decreased pain.  Facet blocks may be done anywhere along the spine from the neck to the low back depending upon the location of your pain.   After numbing the skin with local anesthetic (like Novocaine), a small needle is passed onto the facet joints under x-Othman guidance.  You may experience a sensation of pressure while this is being done.  The entire block usually lasts about 15-25 minutes.   Conditions which may be treated by facet blocks:   Low back/buttock pain  Neck/shoulder pain  Certain types of headaches  Preparation for the injection:  1. Do not eat any solid food or dairy products within 8 hours of your appointment. 2. You may drink clear liquid up to 3 hours before appointment.  Clear liquids include water, black coffee, juice or soda.  No milk or cream please. 3. You may take your regular medication, including pain medications, with  a sip of water before your appointment.  Diabetics should hold regular insulin (if taken separately) and take 1/2 normal NPH dose the morning of the procedure.  Carry some sugar containing items with you to your appointment. 4. A driver must accompany you and be prepared to drive you home after your procedure. 5. Bring all your current medications with you. 6. An IV may be inserted and sedation may be given at the discretion of the physician. 7. A blood pressure cuff, EKG and other monitors  will often be applied during the procedure.  Some patients may need to have extra oxygen administered for a short period. 8. You will be asked to provide medical information, including your allergies and medications, prior to the procedure.  We must know immediately if you are taking blood thinners (like Coumadin/Warfarin) or if you are allergic to IV iodine contrast (dye).  We must know if you could possible be pregnant.  Possible side-effects:   Bleeding from needle site  Infection (rare, may require surgery)  Nerve injury (rare)  Numbness & tingling (temporary)  Difficulty urinating (rare, temporary)  Spinal headache (a headache worse with upright posture)  Light-headedness (temporary)  Pain at injection site (serveral days)  Decreased blood pressure (rare, temporary)  Weakness in arm/leg (temporary)  Pressure sensation in back/neck (temporary)   Call if you experience:   Fever/chills associated with headache or increased back/neck pain  Headache worsened by an upright position  New onset, weakness or numbness of an extremity below the injection site  Hives or difficulty breathing (go to the emergency room)  Inflammation or drainage at the injection site(s)  Severe back/neck pain greater than usual  New symptoms which are concerning to you  Please note:  Although the local anesthetic injected can often make your back or neck feel good for several hours after the injection,  the pain will likely return. It takes 3-7 days for steroids to work.  You may not notice any pain relief for at least one week.  If effective, we will often do a series of 2-3 injections spaced 3-6 weeks apart to maximally decrease your pain.  After the initial series, you may be a candidate for a more permanent nerve block of the facets.  If you have any questions, please call #[phone] [facility]

## 2016-12-17 NOTE — Progress Notes (Signed)
Safety precautions to be maintained throughout the outpatient stay will include: orient to surroundings, keep bed in low position, maintain call bell within reach at all times, provide assistance with transfer out of bed and ambulation.  

## 2016-12-18 DIAGNOSIS — M47816 Spondylosis without myelopathy or radiculopathy, lumbar region: Secondary | ICD-10-CM | POA: Insufficient documentation

## 2016-12-23 ENCOUNTER — Telehealth: Payer: Self-pay | Admitting: Family Medicine

## 2016-12-23 ENCOUNTER — Ambulatory Visit: Payer: Managed Care, Other (non HMO) | Admitting: Pain Medicine

## 2016-12-23 NOTE — Telephone Encounter (Signed)
Please notify patient that cyclobenzaprine (Flexeril) plus duloxetine (Cymbalta) can increase her risk of serotonin syndrome; please encourage her to discuss this with the doctor prescribing the cyclobenzaprine Refer her to the [facility] page for education She can just do an internet search for [facility] and serotonin syndrome and it should pop up [email or web address]  Seek ER evaluation and treatment immediately if any symptoms arise Thank you

## 2016-12-23 NOTE — Telephone Encounter (Signed)
Left detail messaged 

## 2016-12-25 ENCOUNTER — Encounter: Payer: Self-pay | Admitting: Pain Medicine

## 2016-12-25 ENCOUNTER — Ambulatory Visit
Admission: RE | Admit: 2016-12-25 | Discharge: 2016-12-25 | Disposition: A | Discharge status: Home / Self Care | Payer: Managed Care, Other (non HMO) | Source: Ambulatory Visit | Attending: Pain Medicine | Admitting: Pain Medicine

## 2016-12-25 ENCOUNTER — Ambulatory Visit (HOSPITAL_BASED_OUTPATIENT_CLINIC_OR_DEPARTMENT_OTHER): Payer: Managed Care, Other (non HMO) | Admitting: Pain Medicine

## 2016-12-25 VITALS — BP 124/94 | HR 80 | Temp 98.2°F | Resp 17 | Ht 66.0 in | Wt 185.0 lb

## 2016-12-25 DIAGNOSIS — M4696 Unspecified inflammatory spondylopathy, lumbar region: Secondary | ICD-10-CM | POA: Diagnosis present

## 2016-12-25 DIAGNOSIS — M5442 Lumbago with sciatica, left side: Secondary | ICD-10-CM

## 2016-12-25 DIAGNOSIS — G8929 Other chronic pain: Secondary | ICD-10-CM

## 2016-12-25 DIAGNOSIS — M47816 Spondylosis without myelopathy or radiculopathy, lumbar region: Secondary | ICD-10-CM

## 2016-12-25 MED ORDER — ROPIVACAINE HCL 2 MG/ML IJ SOLN
INTRAMUSCULAR | Status: AC
  Filled 2016-12-25: qty 20

## 2016-12-25 MED ORDER — FENTANYL CITRATE (PF) 100 MCG/2ML IJ SOLN
25.0000 ug | INTRAMUSCULAR | Status: DC | PRN
  Administered 2016-12-25: 100 ug via INTRAVENOUS

## 2016-12-25 MED ORDER — MIDAZOLAM HCL 5 MG/5ML IJ SOLN
1.0000 mg | INTRAMUSCULAR | Status: DC | PRN
  Administered 2016-12-25: 4 mg via INTRAVENOUS

## 2016-12-25 MED ORDER — LIDOCAINE HCL (PF) 1.5 % IJ SOLN
20.0000 mL | Freq: Once | INTRAMUSCULAR | Status: AC
  Administered 2016-12-25: 20 mL
  Filled 2016-12-25: qty 20

## 2016-12-25 MED ORDER — ROPIVACAINE HCL 2 MG/ML IJ SOLN
10.0000 mL | Freq: Once | INTRAMUSCULAR | Status: AC
  Administered 2016-12-25: 10 mL via PERINEURAL

## 2016-12-25 MED ORDER — MIDAZOLAM HCL 5 MG/5ML IJ SOLN
INTRAMUSCULAR | Status: AC
  Filled 2016-12-25: qty 5

## 2016-12-25 MED ORDER — LACTATED RINGERS IV SOLN
1000.0000 mL | Freq: Once | INTRAVENOUS | Status: AC
  Administered 2016-12-25: 1000 mL via INTRAVENOUS

## 2016-12-25 MED ORDER — FENTANYL CITRATE (PF) 100 MCG/2ML IJ SOLN
INTRAMUSCULAR | Status: AC
Start: 2016-12-25 — End: ?
  Filled 2016-12-25: qty 2

## 2016-12-25 NOTE — Patient Instructions (Addendum)
____________________________________________________________________________________________  Post-Procedure instructions Instructions:  Apply ice: Fill a plastic sandwich bag with crushed ice. Cover it with a small towel and apply to injection site. Apply for 15 minutes then remove x 15 minutes. Repeat sequence on day of procedure, until you go to bed. The purpose is to minimize swelling and discomfort after procedure.  Apply heat: Apply heat to procedure site starting the day following the procedure. The purpose is to treat any soreness and discomfort from the procedure.  Food intake: Start with clear liquids (like water) and advance to regular food, as tolerated.   Physical activities: Keep activities to a minimum for the first 8 hours after the procedure.   Driving: If you have received any sedation, you are not allowed to drive for 24 hours after your procedure.  Blood thinner: Restart your blood thinner 6 hours after your procedure. (Only for those taking blood thinners)  Insulin: As soon as you can eat, you may resume your normal dosing schedule. (Only for those taking insulin)  Infection prevention: Keep procedure site clean and dry.  Post-procedure Pain Diary: Extremely important that this be done correctly and accurately. Recorded information will be used to determine the next step in treatment.  Pain evaluated is that of treated area only. Do not include pain from an untreated area.  Complete every hour, on the hour, for the initial 8 hours. Set an alarm to help you do this part accurately.  Do not go to sleep and have it completed later. It will not be accurate.  Follow-up appointment: Keep your follow-up appointment after the procedure. Usually 2 weeks for most procedures. (6 weeks in the case of radiofrequency.) Bring you pain diary.  Expect:  From numbing medicine (AKA: Local Anesthetics): Numbness or decrease in pain.  Onset: Full effect within 15 minutes of  injected.  Duration: It will depend on the type of local anesthetic used. On the average, 1 to 8 hours.   From steroids: Decrease in swelling or inflammation. Once inflammation is improved, relief of the pain will follow.  Onset of benefits: Depends on the amount of swelling present. The more swelling, the longer it will take for the benefits to be seen. In some cases, up to 10 days.  Duration: Steroids will stay in the system x 2 weeks. Duration of benefits will depend on multiple posibilities including persistent irritating factors.  From procedure: Some discomfort is to be expected once the numbing medicine wears off. This should be minimal if ice and heat are applied as instructed. Call if:  You experience numbness and weakness that gets worse with time, as opposed to wearing off.  New onset bowel or bladder incontinence. (Spinal procedures only)  Emergency Numbers:  Durning business hours (Monday - Thursday, 8:00 AM - 4:00 PM) (Friday, 9:00 AM - 12:00 Noon): [phone]  After hours: [phone] ____________________________________________________________________________________________   Post-Procedure Pain Diary   Name: Date of Service Procedure      Time Period Pain Score Painful Area  Pre-procedure ____/10   Time Period Pain Score Area improved. Area not improved.  15 to 30 min post-procedure ____/10    1st hour after procedure ____/10    2nd hour after procedure ____/10    3rd hour after procedure ____/10    4th hour after procedure ____/10    5th hour after procedure ____/10    6th hour after procedure ____/10    7th hour after procedure ____/10    Time Period Pain Score Area   improved. Area not improved.  Note: From here on, always document your pain score 1st thing in the morning.  1st day after procedure ____/10    2nd day after procedure ____/10    3rd day after procedure ____/10    4th day after procedure ____/10    5th day after procedure ____/10     Time Period Pain Score Area improved. Area not improved.  10th day after procedure ____/10    20th day after procedure ____/10     Benefits Indicate for each set of activities if the procedure changes your ability to accomplish them.  Activity Worse No-Change Improved  Dressing, eating, walking, toileting, hygiene     Shopping, housekeeping, food preparation, community transportation     Range of motion of affected area      Your opinion Please indicate which statement best describes your impression of this treatment.  Statement (X)  Based on the results, I am encouraged.   Based on the results, I am disappointed.   I am not sure I have an opinion at this point.    Note: Make sure to complete and return this form to your physician, on your follow-up appointment. This information will be used to interpret the results. Failure to accurately complete, or to return this information, may result in less than optimal outcomes.  

## 2016-12-25 NOTE — Progress Notes (Signed)
Patient's Name: [name]  MRN: [id number]  Referring Provider: Milinda Pointer, MD  DOB: 1971/04/06  PCP: Arnetha Courser, MD  DOS: 12/25/2016  Note by: Gaspar Cola, MD  Service setting: Ambulatory outpatient  Specialty: Interventional Pain Management  Patient type: Established  Location: [name] (AMB) Pain Management Facility  Visit type: Interventional Procedure   Primary Reason for Visit: Interventional Pain Management Treatment. CC: Back Pain (mid to lower)  Procedure:  Anesthesia, Analgesia, Anxiolysis:  Type: Diagnostic Medial Branch Facet Block (NO STEROIDS) Region: Lumbar Level: L2, L3, L4, L5, & S1 Medial Branch Level(s) Laterality: Bilateral  Type: Local Anesthesia with Moderate (Conscious) Sedation Local Anesthetic: Lidocaine 1% Route: Intravenous (IV) IV Access: Secured Sedation: Meaningful verbal contact was maintained at all times during the procedure  Indication(s): Analgesia and Anxiety  Indications: 1. Lumbar facet syndrome (Bilateral)   2. Lumbar spondylosis   3. Chronic low back pain (Location of Primary Source of Pain) (midline)    Pain Score: Pre-procedure: 3 /10 Post-procedure: 0-No pain/10  Pre-op Assessment:  Previous date of service: 12/05/16 Service provided: Evaluation (initial eval) [name] is a 46 y.o. (year old), female patient, seen today for interventional treatment. She  has a past surgical history that includes Abdominal hysterectomy; Tonsillectomy; Cesarean section; Spine surgery; Spine surgery (09/09/2015); and Back surgery. [name] has a current medication list which includes the following prescription(s): cyclobenzaprine, duloxetine, meloxicam, and oxycodone hcl, and the following Facility-Administered Medications: fentanyl and midazolam. Her primarily concern today is the Back Pain (mid to lower)  Initial Vital Signs: There were no vitals taken for this visit. BMI: 29.86 kg/m  Risk Assessment: Allergies: Reviewed. She is allergic to  codeine.  Allergy Precautions: None required Coagulopathies: Reviewed. None identified.  Blood-thinner therapy: None at this time Active Infection(s): Reviewed. None identified. [name] is afebrile  Site Confirmation: [name] was asked to confirm the procedure and laterality before marking the site Procedure checklist: Completed Consent: Before the procedure and under the influence of no sedative(s), amnesic(s), or anxiolytics, the patient was informed of the treatment options, risks and possible complications. To fulfill our ethical and legal obligations, as recommended by the American Medical Association's Code of Ethics, I have informed the patient of my clinical impression; the nature and purpose of the treatment or procedure; the risks, benefits, and possible complications of the intervention; the alternatives, including doing nothing; the risk(s) and benefit(s) of the alternative treatment(s) or procedure(s); and the risk(s) and benefit(s) of doing nothing. The patient was provided information about the general risks and possible complications associated with the procedure. These may include, but are not limited to: failure to achieve desired goals, infection, bleeding, organ or nerve damage, allergic reactions, paralysis, and death. In addition, the patient was informed of those risks and complications associated to Spine-related procedures, such as failure to decrease pain; infection (i.e.: Meningitis, epidural or intraspinal abscess); bleeding (i.e.: epidural hematoma, subarachnoid hemorrhage, or any other type of intraspinal or peri-dural bleeding); organ or nerve damage (i.e.: Any type of peripheral nerve, nerve root, or spinal cord injury) with subsequent damage to sensory, motor, and/or autonomic systems, resulting in permanent pain, numbness, and/or weakness of one or several areas of the body; allergic reactions; (i.e.: anaphylactic reaction); and/or death. Furthermore, the patient was  informed of those risks and complications associated with the medications. These include, but are not limited to: allergic reactions (i.e.: anaphylactic or anaphylactoid reaction(s)); adrenal axis suppression; blood sugar elevation that in diabetics may result in ketoacidosis  or comma; water retention that in patients with history of congestive heart failure may result in shortness of breath, pulmonary edema, and decompensation with resultant heart failure; weight gain; swelling or edema; medication-induced neural toxicity; particulate matter embolism and blood vessel occlusion with resultant organ, and/or nervous system infarction; and/or aseptic necrosis of one or more joints. Finally, the patient was informed that Medicine is not an exact science; therefore, there is also the possibility of unforeseen or unpredictable risks and/or possible complications that may result in a catastrophic outcome. The patient indicated having understood very clearly. We have given the patient no guarantees and we have made no promises. Enough time was given to the patient to ask questions, all of which were answered to the patient's satisfaction. [name] has indicated that she wanted to continue with the procedure. Attestation: I, the ordering provider, attest that I have discussed with the patient the benefits, risks, side-effects, alternatives, likelihood of achieving goals, and potential problems during recovery for the procedure that I have provided informed consent. Date: 12/25/2016; Time: 7:33 AM  Pre-Procedure Preparation:  Monitoring: As per clinic protocol. Respiration, ETCO2, SpO2, BP, heart rate and rhythm monitor placed and checked for adequate function Safety Precautions: Patient was assessed for positional comfort and pressure points before starting the procedure. Time-out: I initiated and conducted the "Time-out" before starting the procedure, as per protocol. The patient was asked to participate by confirming  the accuracy of the "Time Out" information. Verification of the correct person, site, and procedure were performed and confirmed by me, the nursing staff, and the patient. "Time-out" conducted as per Joint Commission's Universal Protocol (UP.01.01.01). "Time-out" Date & Time: 12/25/2016; 0855 hrs.  Description of Procedure Process:   Position: Prone Target Area: For Lumbar Facet blocks, the target is the groove formed by the junction of the transverse process and superior articular process. For the L5 dorsal ramus, the target is the notch between superior articular process and sacral ala. For the S1 dorsal ramus, the target is the superior and lateral edge of the posterior S1 Sacral foramen. Approach: Paramedial approach. Area Prepped: Entire Posterior Lumbosacral Region Prepping solution: ChloraPrep (2% chlorhexidine gluconate and 70% isopropyl alcohol) Safety Precautions: Aspiration looking for blood return was conducted prior to all injections. At no point did we inject any substances, as a needle was being advanced. No attempts were made at seeking any paresthesias. Safe injection practices and needle disposal techniques used. Medications properly checked for expiration dates. SDV (single dose vial) medications used. Description of the Procedure: Protocol guidelines were followed. The patient was placed in position over the fluoroscopy table. The target area was identified and the area prepped in the usual manner. Skin desensitized using vapocoolant spray. Skin & deeper tissues infiltrated with local anesthetic. Appropriate amount of time allowed to pass for local anesthetics to take effect. The procedure needle was introduced through the skin, ipsilateral to the reported pain, and advanced to the target area. Employing the "Medial Branch Technique", the needles were advanced to the angle made by the superior and medial portion of the transverse process, and the lateral and inferior portion of the  superior articulating process of the targeted vertebral bodies. This area is known as "Burton's Eye" or the "Eye of the [place] Dog". A procedure needle was introduced through the skin, and this time advanced to the angle made by the superior and medial border of the sacral ala, and the lateral border of the S1 vertebral body. This last needle was later repositioned  at the superior and lateral border of the posterior S1 foramen. Negative aspiration confirmed. Solution injected in intermittent fashion, asking for systemic symptoms every 0.5cc of injectate. The needles were then removed and the area cleansed, making sure to leave some of the prepping solution back to take advantage of its long term bactericidal properties.   Illustration of the posterior view of the lumbar spine and the posterior neural structures. Laminae of L2 through S1 are labeled. DPRL5, dorsal primary ramus of L5; DPRS1, dorsal primary ramus of S1; DPR3, dorsal primary ramus of L3; FJ, facet (zygapophyseal) joint L3-L4; I, inferior articular process of L4; LB1, lateral branch of dorsal primary ramus of L1; IAB, inferior articular branches from L3 medial branch (supplies L4-L5 facet joint); IBP, intermediate branch plexus; MB3, medial branch of dorsal primary ramus of L3; NR3, third lumbar nerve root; S, superior articular process of L5; SAB, superior articular branches from L4 (supplies L4-5 facet joint also); TP3, transverse process of L3.  Vitals:   12/25/16 0919 12/25/16 0926 12/25/16 0930 12/25/16 0938  BP: 112/81 (!) 122/91 (!) 132/91 (!) 124/94  Pulse:      Resp: 17 14 15 17   Temp: 98.2 F (36.8 C)     SpO2: 95% 94% 95% 96%  Weight:      Height:        Start Time: 0856 hrs. End Time: 0909 hrs. Materials:  Needle(s) Type: Regular needle Gauge: 22G Length: 3.5-in Medication(s): We administered lactated ringers, midazolam, fentaNYL, lidocaine, ropivacaine (PF) 2 mg/mL (0.2%), and ropivacaine (PF) 2 mg/mL (0.2%). Please  see chart orders for dosing details.  Imaging Guidance (Spinal):  Type of Imaging Technique: Fluoroscopy Guidance (Spinal) Indication(s): Assistance in needle guidance and placement for procedures requiring needle placement in or near specific anatomical locations not easily accessible without such assistance. Exposure Time: Please see nurses notes. Contrast: None used. Fluoroscopic Guidance: I was personally present during the use of fluoroscopy. "Tunnel Vision Technique" used to obtain the best possible view of the target area. Parallax error corrected before commencing the procedure. "Direction-depth-direction" technique used to introduce the needle under continuous pulsed fluoroscopy. Once target was reached, antero-posterior, oblique, and lateral fluoroscopic projection used confirm needle placement in all planes. Images permanently stored in EMR. Interpretation: No contrast injected. I personally interpreted the imaging intraoperatively. Adequate needle placement confirmed in multiple planes. Permanent images saved into the patient's record.  Antibiotic Prophylaxis:  Indication(s): None identified Antibiotic given: None  Post-operative Assessment:  EBL: None Complications: No immediate post-treatment complications observed by team, or reported by patient. Note: The patient tolerated the entire procedure well. A repeat set of vitals were taken after the procedure and the patient was kept under observation following institutional policy, for this type of procedure. Post-procedural neurological assessment was performed, showing return to baseline, prior to discharge. The patient was provided with post-procedure discharge instructions, including a section on how to identify potential problems. Should any problems arise concerning this procedure, the patient was given instructions to immediately contact us, at any time, without hesitation. In any case, we plan to contact the patient by telephone  for a follow-up status report regarding this interventional procedure. Comments:  No additional relevant information.  Plan of Care  Disposition: Discharge home  Discharge Date & Time: 12/25/2016; 0940 hrs.  Physician-requested Follow-up:  Return for post-procedure eval (in 2 wks).  Future Appointments Date Time Provider [place]  01/14/2017 1:30 PM Milinda Pointer, MD [name] None   Medications ordered for procedure: Meds ordered  this encounter  Medications  . lactated ringers infusion 1,000 mL  . midazolam (VERSED) 5 MG/5ML injection 1-2 mg    Make sure Flumazenil is available in the pyxis when using this medication. If oversedation occurs, administer 0.2 mg IV over 15 sec. If after 45 sec no response, administer 0.2 mg again over 1 min; may repeat at 1 min intervals; not to exceed 4 doses (1 mg)  . fentaNYL (SUBLIMAZE) injection 25-50 mcg    Make sure Narcan is available in the pyxis when using this medication. In the event of respiratory depression (RR< 8/min): Titrate NARCAN (naloxone) in increments of 0.1 to 0.2 mg IV at 2-3 minute intervals, until desired degree of reversal.  . lidocaine 1.5 % injection 20 mL    From block tray  . ropivacaine (PF) 2 mg/mL (0.2%) (NAROPIN) injection 10 mL  . ropivacaine (PF) 2 mg/mL (0.2%) (NAROPIN) injection 10 mL   Medications administered: We administered lactated ringers, midazolam, fentaNYL, lidocaine, ropivacaine (PF) 2 mg/mL (0.2%), and ropivacaine (PF) 2 mg/mL (0.2%).  See the medical record for exact dosing, route, and time of administration.  Lab-work, Procedure(s), & Referral(s) Ordered: Orders Placed This Encounter  Procedures  . LUMBAR FACET(MEDIAL BRANCH NERVE BLOCK) MBNB  . DG C-Arm 1-60 Min-No Report  . Informed Consent Details: Transcribe to consent form and obtain patient signature  . Provider attestation of informed consent for procedure/surgical case  . Verify informed consent  . Discharge instructions  .  Follow-up   Imaging Ordered: No results found for this or any previous visit. New Prescriptions   No medications on file   Primary Care Physician: Arnetha Courser, MD Location: [facility] Note by: Gaspar Cola, MD Date: 12/25/2016; Time: 9:53 AM  Disclaimer:  Medicine is not an exact science. The only guarantee in medicine is that nothing is guaranteed. It is important to note that the decision to proceed with this intervention was based on the information collected from the patient. The Data and conclusions were drawn from the patient's questionnaire, the interview, and the physical examination. Because the information was provided in large part by the patient, it cannot be guaranteed that it has not been purposely or unconsciously manipulated. Every effort has been made to obtain as much relevant data as possible for this evaluation. It is important to note that the conclusions that lead to this procedure are derived in large part from the available data. Always take into account that the treatment will also be dependent on availability of resources and existing treatment guidelines, considered by other Pain Management Practitioners as being common knowledge and practice, at the time of the intervention. For Medico-Legal purposes, it is also important to point out that variation in procedural techniques and pharmacological choices are the acceptable norm. The indications, contraindications, technique, and results of the above procedure should only be interpreted and judged by a Board-Certified Interventional Pain Specialist with extensive familiarity and expertise in the same exact procedure and technique.  Instructions provided at this appointment: Patient Instructions   ____________________________________________________________________________________________  Post-Procedure instructions Instructions:  Apply ice: Fill a plastic sandwich bag with  crushed ice. Cover it with a small towel and apply to injection site. Apply for 15 minutes then remove x 15 minutes. Repeat sequence on day of procedure, until you go to bed. The purpose is to minimize swelling and discomfort after procedure.  Apply heat: Apply heat to procedure site starting the day following the procedure. The purpose is to  treat any soreness and discomfort from the procedure.  Food intake: Start with clear liquids (like water) and advance to regular food, as tolerated.   Physical activities: Keep activities to a minimum for the first 8 hours after the procedure.   Driving: If you have received any sedation, you are not allowed to drive for 24 hours after your procedure.  Blood thinner: Restart your blood thinner 6 hours after your procedure. (Only for those taking blood thinners)  Insulin: As soon as you can eat, you may resume your normal dosing schedule. (Only for those taking insulin)  Infection prevention: Keep procedure site clean and dry.  Post-procedure Pain Diary: Extremely important that this be done correctly and accurately. Recorded information will be used to determine the next step in treatment.  Pain evaluated is that of treated area only. Do not include pain from an untreated area.  Complete every hour, on the hour, for the initial 8 hours. Set an alarm to help you do this part accurately.  Do not go to sleep and have it completed later. It will not be accurate.  Follow-up appointment: Keep your follow-up appointment after the procedure. Usually 2 weeks for most procedures. (6 weeks in the case of radiofrequency.) Bring you pain diary.  Expect:  From numbing medicine (AKA: Local Anesthetics): Numbness or decrease in pain.  Onset: Full effect within 15 minutes of injected.  Duration: It will depend on the type of local anesthetic used. On the average, 1 to 8 hours.   From steroids: Decrease in swelling or inflammation. Once inflammation is improved,  relief of the pain will follow.  Onset of benefits: Depends on the amount of swelling present. The more swelling, the longer it will take for the benefits to be seen. In some cases, up to 10 days.  Duration: Steroids will stay in the system x 2 weeks. Duration of benefits will depend on multiple posibilities including persistent irritating factors.  From procedure: Some discomfort is to be expected once the numbing medicine wears off. This should be minimal if ice and heat are applied as instructed. Call if:  You experience numbness and weakness that gets worse with time, as opposed to wearing off.  New onset bowel or bladder incontinence. (Spinal procedures only)  Emergency Numbers:  Durning business hours (Monday - Thursday, 8:00 AM - 4:00 PM) (Friday, 9:00 AM - 12:00 Noon): [phone]  After hours: [phone] ____________________________________________________________________________________________   Post-Procedure Pain Diary   Name: Date of Service Procedure      Time Period Pain Score Painful Area  Pre-procedure ____/10   Time Period Pain Score Area improved. Area not improved.  15 to 30 min post-procedure ____/10    1st hour after procedure ____/10    2nd hour after procedure ____/10    3rd hour after procedure ____/10    4th hour after procedure ____/10    5th hour after procedure ____/10    6th hour after procedure ____/10    7th hour after procedure ____/10    Time Period Pain Score Area improved. Area not improved.  Note: From here on, always document your pain score 1st thing in the morning.  1st day after procedure ____/10    2nd day after procedure ____/10    3rd day after procedure ____/10    4th day after procedure ____/10    5th day after procedure ____/10    Time Period Pain Score Area improved. Area not improved.  10th day after procedure ____/10  20th day after procedure ____/10     Benefits Indicate for each set of activities if the  procedure changes your ability to accomplish them.  Activity Worse No-Change Improved  Dressing, eating, walking, toileting, hygiene     Shopping, housekeeping, food preparation, community transportation     Range of motion of affected area      Your opinion Please indicate which statement best describes your impression of this treatment.  Statement (X)  Based on the results, I am encouraged.   Based on the results, I am disappointed.   I am not sure I have an opinion at this point.    Note: Make sure to complete and return this form to your physician, on your follow-up appointment. This information will be used to interpret the results. Failure to accurately complete, or to return this information, may result in less than optimal outcomes.

## 2016-12-25 NOTE — Progress Notes (Signed)
Safety precautions to be maintained throughout the outpatient stay will include: orient to surroundings, keep bed in low position, maintain call bell within reach at all times, provide assistance with transfer out of bed and ambulation.  

## 2016-12-26 ENCOUNTER — Telehealth: Payer: Self-pay | Admitting: *Deleted

## 2016-12-26 NOTE — Telephone Encounter (Signed)
Voicemail left with patient to call our office if there are questions or concerns re; procedure on yesterday.  

## 2017-01-14 ENCOUNTER — Ambulatory Visit: Payer: Managed Care, Other (non HMO) | Attending: Pain Medicine | Admitting: Pain Medicine

## 2017-01-14 ENCOUNTER — Encounter: Payer: Self-pay | Admitting: Pain Medicine

## 2017-01-14 VITALS — BP 130/80 | HR 89 | Temp 98.1°F | Resp 16 | Ht 66.0 in | Wt 185.0 lb

## 2017-01-14 DIAGNOSIS — M792 Neuralgia and neuritis, unspecified: Secondary | ICD-10-CM

## 2017-01-14 DIAGNOSIS — F1190 Opioid use, unspecified, uncomplicated: Secondary | ICD-10-CM

## 2017-01-14 DIAGNOSIS — F4321 Adjustment disorder with depressed mood: Secondary | ICD-10-CM | POA: Insufficient documentation

## 2017-01-14 DIAGNOSIS — M4696 Unspecified inflammatory spondylopathy, lumbar region: Secondary | ICD-10-CM | POA: Diagnosis not present

## 2017-01-14 DIAGNOSIS — Z79899 Other long term (current) drug therapy: Secondary | ICD-10-CM | POA: Insufficient documentation

## 2017-01-14 DIAGNOSIS — G8929 Other chronic pain: Secondary | ICD-10-CM | POA: Diagnosis not present

## 2017-01-14 DIAGNOSIS — M961 Postlaminectomy syndrome, not elsewhere classified: Secondary | ICD-10-CM | POA: Diagnosis not present

## 2017-01-14 DIAGNOSIS — G894 Chronic pain syndrome: Secondary | ICD-10-CM | POA: Diagnosis not present

## 2017-01-14 DIAGNOSIS — E559 Vitamin D deficiency, unspecified: Secondary | ICD-10-CM | POA: Diagnosis not present

## 2017-01-14 DIAGNOSIS — M533 Sacrococcygeal disorders, not elsewhere classified: Secondary | ICD-10-CM | POA: Insufficient documentation

## 2017-01-14 DIAGNOSIS — F17210 Nicotine dependence, cigarettes, uncomplicated: Secondary | ICD-10-CM | POA: Insufficient documentation

## 2017-01-14 DIAGNOSIS — M545 Low back pain: Secondary | ICD-10-CM | POA: Diagnosis not present

## 2017-01-14 DIAGNOSIS — M47897 Other spondylosis, lumbosacral region: Secondary | ICD-10-CM | POA: Insufficient documentation

## 2017-01-14 DIAGNOSIS — M47816 Spondylosis without myelopathy or radiculopathy, lumbar region: Secondary | ICD-10-CM

## 2017-01-14 DIAGNOSIS — Z5181 Encounter for therapeutic drug level monitoring: Secondary | ICD-10-CM | POA: Diagnosis not present

## 2017-01-14 DIAGNOSIS — M47817 Spondylosis without myelopathy or radiculopathy, lumbosacral region: Secondary | ICD-10-CM | POA: Diagnosis not present

## 2017-01-14 DIAGNOSIS — Z885 Allergy status to narcotic agent status: Secondary | ICD-10-CM | POA: Diagnosis not present

## 2017-01-14 DIAGNOSIS — M47896 Other spondylosis, lumbar region: Secondary | ICD-10-CM | POA: Diagnosis not present

## 2017-01-14 DIAGNOSIS — M7918 Myalgia, other site: Secondary | ICD-10-CM

## 2017-01-14 DIAGNOSIS — M791 Myalgia: Secondary | ICD-10-CM | POA: Diagnosis not present

## 2017-01-14 DIAGNOSIS — M5442 Lumbago with sciatica, left side: Secondary | ICD-10-CM

## 2017-01-14 DIAGNOSIS — Z79891 Long term (current) use of opiate analgesic: Secondary | ICD-10-CM | POA: Diagnosis not present

## 2017-01-14 MED ORDER — OXYCODONE HCL 10 MG PO TABS: 10.0000 mg | ORAL_TABLET | Freq: Four times a day (QID) | ORAL | 0 refills | Status: DC | PRN

## 2017-01-14 MED ORDER — DULOXETINE HCL 30 MG PO CPEP: 30.0000 mg | ORAL_CAPSULE | Freq: Two times a day (BID) | ORAL | 0 refills | Status: DC

## 2017-01-14 MED ORDER — CYCLOBENZAPRINE HCL 10 MG PO TABS: 10.0000 mg | ORAL_TABLET | Freq: Every day | ORAL | 0 refills | Status: DC

## 2017-01-14 MED ORDER — GABAPENTIN 100 MG PO CAPS: 100.0000 mg | ORAL_CAPSULE | Freq: Every day | ORAL | 0 refills | Status: DC

## 2017-01-14 MED ORDER — MELOXICAM 15 MG PO TABS: 15.0000 mg | ORAL_TABLET | Freq: Every day | ORAL | 0 refills | Status: DC

## 2017-01-14 NOTE — Progress Notes (Signed)
Patient's Name: [name]  MRN: [id number]  Referring Provider: Arnetha Courser, MD  DOB: [birth year]  PCP: Arnetha Courser, MD  DOS: 01/14/2017  Note by: Gaspar Cola, MD  Service setting: Ambulatory outpatient  Specialty: Interventional Pain Management  Location: [name] (AMB) Pain Management Facility    Patient type: Established   Primary Reason(s) for Visit: Encounter for prescription drug management & post-procedure evaluation of chronic illness with mild to moderate exacerbation(Level of risk: moderate) CC: Back Pain (lower bilateral)  HPI  [name] is a 46 y.o. year old, female patient, who comes today for a post-procedure evaluation and medication management. She has Adjustment disorder with depressed mood; Stressful life events affecting family and household; Preventative health care; Vitamin D deficiency; Lumbosacral spondylosis without myelopathy; Lumbar central spinal stenosis (L4-5); Breast cancer screening; Bladder irritation; Chronic low back pain (Location of Primary Source of Pain) (midline); Arthralgia of multiple joints; Chronic, continuous use of opioids; Elevated rheumatoid factor; Chronic pain syndrome; Long term (current) use of opiate analgesic; Long term prescription opiate use; Opiate use (85.5 MME/Day); Chronic hip pain (Location of Secondary source of pain) (Left); Failed back surgical syndrome; Discogenic low back pain; Lumbar discogenic pain syndrome; Chronic sacroiliac joint pain (Left); Musculoskeletal pain; Lumbar facet syndrome (Bilateral); Lumbar spondylosis; and Neurogenic pain on her problem list. Her primarily concern today is the Back Pain (lower bilateral)  Pain Assessment: Location: Lower, Left, Right Back Radiating: NA Onset: More than a month ago Duration: Chronic pain Quality: Dull, Aching, Throbbing, Constant, Discomfort Severity: 3 /10 (self-reported pain score)  Note: Reported level is compatible with observation.                   Effect on ADL:  task are unbearable.  can't sit too long or stand too long Timing: Constant Modifying factors: medicine, position changes  [name] was last seen on 12/25/2016 for a procedure. During today's appointment we reviewed [name]'s post-procedure results, as well as her outpatient medication regimen.  Further details on both, my assessment(s), as well as the proposed treatment plan, please see below.  Controlled Substance Pharmacotherapy Assessment REMS (Risk Evaluation and Mitigation Strategy)  Analgesic: Oxycodone IR 10 mg every 6 hours (40 mg/day of oxycodone) (60 MME/day) MME/day: 60 mg/day.  [name], RN  01/14/2017  1:27 PM  Sign at close encounter Nursing Pain Medication Assessment:  Safety precautions to be maintained throughout the outpatient stay will include: orient to surroundings, keep bed in low position, maintain call bell within reach at all times, provide assistance with transfer out of bed and ambulation.  Medication Inspection Compliance: Pill count conducted under aseptic conditions, in front of the patient. Neither the pills nor the bottle was removed from the patient's sight at any time. Once count was completed pills were immediately returned to the patient in their original bottle.  Medication: See above Pill/Patch Count: 1 of 120 pills remain Pill/Patch Appearance: Markings consistent with prescribed medication Bottle Appearance: Standard pharmacy container. Clearly labeled. Filled Date: 07 / 10 / 2018 Last Medication intake:  Today   Pharmacokinetics: Liberation and absorption (onset of action): WNL Distribution (time to peak effect): WNL Metabolism and excretion (duration of action): WNL         Pharmacodynamics: Desired effects: Analgesia: [name] reports >50% benefit. Functional ability: Patient reports that medication allows her to accomplish basic ADLs Clinically meaningful improvement in function (CMIF): Sustained CMIF goals met Perceived  effectiveness: Described as relatively effective, allowing for  increase in activities of daily living (ADL) Undesirable effects: Side-effects or Adverse reactions: None reported Monitoring: [place] PMP: Online review of the past 58-monthperiod conducted. Compliant with practice rules and regulations List of all UDS test(s) done:  Lab Results  Component Value Date   SUMMARY FINAL 12/05/2016   Last UDS on record: Summary  Date Value Ref Range Status  12/05/2016 FINAL  Final    Comment:    ==================================================================== [facility] COMP DRUG ANALYSIS,UR ==================================================================== Test                             Result       Flag       Units Drug Present and Declared for Prescription Verification   Oxycodone                      386          EXPECTED   ng/mg creat   Oxymorphone                    4143         EXPECTED   ng/mg creat   Noroxycodone                   4130         EXPECTED   ng/mg creat   Noroxymorphone                 646          EXPECTED   ng/mg creat    Sources of oxycodone are scheduled prescription medications.    Oxymorphone, noroxycodone, and noroxymorphone are expected    metabolites of oxycodone. Oxymorphone is also available as a    scheduled prescription medication.   Acetaminophen                  PRESENT      EXPECTED Drug Absent but Declared for Prescription Verification   Duloxetine                     Not Detected UNEXPECTED ==================================================================== Test                      Result    Flag   Units      Ref Range   Creatinine              207              mg/dL      >=20 ==================================================================== Declared Medications:  The flagging and interpretation on this report are based on the  following declared medications.  Unexpected results may arise from  inaccuracies in the declared medications.   **Note: The testing scope of this panel includes these medications:  Duloxetine  Oxycodone  Oxycodone (Oxycodone Acetaminophen)  **Note: The testing scope of this panel does not include small to  moderate amounts of these reported medications:  Acetaminophen (Oxycodone Acetaminophen)  **Note: The testing scope of this panel does not include following  reported medications:  Meloxicam ==================================================================== For clinical consultation, please call ([phone] ====================================================================    UDS interpretation: Compliant          Medication Assessment Form: Reviewed. Patient indicates being compliant with therapy Treatment compliance: Compliant Risk Assessment Profile: Aberrant behavior: See prior evaluations. None observed or detected today Comorbid factors increasing risk of overdose: See prior notes. No  additional risks detected today Risk of substance use disorder (SUD): Low Opioid risk tool (ORT) (Total Score): 0     Opioid Risk Tool - 01/14/17 1327      Family History of Substance Abuse   Alcohol Negative   Illegal Drugs Negative   Rx Drugs Negative     Personal History of Substance Abuse   Alcohol Negative   Illegal Drugs Negative   Rx Drugs Negative     Psychological Disease   Psychological Disease Negative   Depression Negative     Total Score   Opioid Risk Tool Scoring 0   Opioid Risk Interpretation Low Risk     ORT Scoring interpretation table:  Score <3 = Low Risk for SUD  Score between 4-7 = Moderate Risk for SUD  Score >8 = High Risk for Opioid Abuse   Risk Mitigation Strategies:  Patient Counseling: Covered Patient-Prescriber Agreement (PPA): Present and active  Notification to other healthcare providers: Done  Pharmacologic Plan: No change in therapy, at this time  Post-Procedure Assessment  12/25/2016 Procedure: Diagnostic bilateral lumbar facet block under  fluoroscopic guidance and IV sedation, no steroids. Pre-procedure pain score:  3/10 Post-procedure pain score: 0/10 (100% relief) Influential Factors: BMI: 29.86 kg/m Intra-procedural challenges: None observed.         Assessment challenges: None detected.              Reported side-effects: None.        Post-procedural adverse reactions or complications: None reported         Sedation: Sedation provided. When no sedatives are used, the analgesic levels obtained are directly associated to the effectiveness of the local anesthetics. However, when sedation is provided, the level of analgesia obtained during the initial 1 hour following the intervention, is believed to be the result of a combination of factors. These factors may include, but are not limited to: 1. The effectiveness of the local anesthetics used. 2. The effects of the analgesic(s) and/or anxiolytic(s) used. 3. The degree of discomfort experienced by the patient at the time of the procedure. 4. The patients ability and reliability in recalling and recording the events. 5. The presence and influence of possible secondary gains and/or psychosocial factors. Reported result: Relief experienced during the 1st hour after the procedure: 100 % (Ultra-Short Term Relief)            Interpretative annotation: Clinically appropriate result. Analgesia during this period is likely to be Local Anesthetic and/or IV Sedative (Analgesic/Anxiolytic) related.          Effects of local anesthetic: The analgesic effects attained during this period are directly associated to the localized infiltration of local anesthetics and therefore cary significant diagnostic value as to the etiological location, or anatomical origin, of the pain. Expected duration of relief is directly dependent on the pharmacodynamics of the local anesthetic used. Long-acting (4-6 hours) anesthetics used.  Reported result: Relief during the next 4 to 6 hour after the procedure: 100 %  (Short-Term Relief)            Interpretative annotation: Clinically appropriate result. Analgesia during this period is likely to be Local Anesthetic-related.          Long-term benefit: Defined as the period of time past the expected duration of local anesthetics (1 hour for short-acting and 4-6 hours for long-acting). With the possible exception of prolonged sympathetic blockade from the local anesthetics, benefits during this period are typically attributed to, or associated with, other factors such  as analgesic sensory neuropraxia, antiinflammatory effects, or beneficial biochemical changes provided by agents other than the local anesthetics.  Reported result: Extended relief following procedure: 10 % (Long-Term Relief)            Interpretative annotation: Clinically appropriate result. Recurrence of symptoms. No permanent benefit expected. Ms. Storlie requested to have NO STEROIDS injected.          Current benefits: Defined as persistent relief that continues at this point in time.   Reported results: Treated area: <25 %       Interpretative annotation: Recurrence of symptoms. Limited therapeutic benefit. Effective diagnostic intervention.          Interpretation: Results would suggest a successful diagnostic intervention.                  Plan:  Proceed with diagnostic procedure # 2.  Laboratory Chemistry  Inflammation Markers (CRP: Acute Phase) (ESR: Chronic Phase) Lab Results  Component Value Date   CRP 5.3 (H) 12/05/2016   ESRSEDRATE 9 12/05/2016                 Renal Function Markers Lab Results  Component Value Date   BUN 17 12/05/2016   CREATININE 0.84 12/05/2016   GFRAA 96 12/05/2016   GFRNONAA 83 12/05/2016                 Hepatic Function Markers Lab Results  Component Value Date   AST 21 12/05/2016   ALT 20 12/05/2016   ALBUMIN 4.4 12/05/2016   ALKPHOS 59 12/05/2016                 Electrolytes Lab Results  Component Value Date   NA 139 12/05/2016   K 4.6  12/05/2016   CL 103 12/05/2016   CALCIUM 9.5 12/05/2016   MG 2.1 12/05/2016                 Neuropathy Markers Lab Results  Component Value Date   VITAMINB12 495 12/05/2016                 Bone Pathology Markers Lab Results  Component Value Date   ALKPHOS 59 12/05/2016   VD25OH 27.1 (L) 09/18/2016   25OHVITD1 49 12/05/2016   25OHVITD2 <1.0 12/05/2016   25OHVITD3 49 12/05/2016   CALCIUM 9.5 12/05/2016                 Coagulation Parameters Lab Results  Component Value Date   INR 0.9 04/16/2013   LABPROT 12.4 04/16/2013   PLT 287 10/02/2016                 Cardiovascular Markers Lab Results  Component Value Date   HGB 13.3 10/02/2016   HCT 40.8 10/02/2016                 Note: Lab results reviewed.  Recent Diagnostic Imaging Review  Dg C-arm 1-60 Min-no Report  Result Date: 12/25/2016 Fluoroscopy was utilized by the requesting physician.  No radiographic interpretation.   Note: Imaging results reviewed.          Meds   Current Meds  Medication Sig  . [START ON 01/16/2017] cyclobenzaprine (FLEXERIL) 10 MG tablet Take 1 tablet (10 mg total) by mouth at bedtime.  . DULoxetine (CYMBALTA) 30 MG capsule Take 1 capsule (30 mg total) by mouth 2 (two) times daily.  Derrill Memo ON 01/16/2017] gabapentin (NEURONTIN) 100 MG capsule Take 1-3 capsules (100-300 mg total) by  mouth at bedtime.  . meloxicam (MOBIC) 15 MG tablet Take 1 tablet (15 mg total) by mouth daily.  Derrill Memo ON 01/16/2017] Oxycodone HCl 10 MG TABS Take 1 tablet (10 mg total) by mouth every 6 (six) hours as needed.  . [DISCONTINUED] cyclobenzaprine (FLEXERIL) 10 MG tablet Take 1 tablet (10 mg total) by mouth at bedtime.  . [DISCONTINUED] DULoxetine (CYMBALTA) 30 MG capsule Take 3 capsules (90 mg total) by mouth daily.  . [DISCONTINUED] meloxicam (MOBIC) 15 MG tablet Take 1 tablet (15 mg total) by mouth daily.  . [DISCONTINUED] Oxycodone HCl 10 MG TABS Take 1 tablet (10 mg total) by mouth every 6 (six) hours as  needed.    ROS  Constitutional: Denies any fever or chills Gastrointestinal: No reported hemesis, hematochezia, vomiting, or acute GI distress Musculoskeletal: Denies any acute onset joint swelling, redness, loss of ROM, or weakness Neurological: No reported episodes of acute onset apraxia, aphasia, dysarthria, agnosia, amnesia, paralysis, loss of coordination, or loss of consciousness  Allergies  Ms. Barber is allergic to codeine.  PFSH  Drug: Ms. Joffe  reports that she does not use drugs. Alcohol:  reports that she does not drink alcohol. Tobacco:  reports that she has been smoking Cigarettes.  She has a 20.00 pack-year smoking history. She has never used smokeless tobacco. Medical:  has a past medical history of Allergy; Anxiety; Chronic bilateral low back pain (07/22/2016); and Depression. Surgical: Ms. Bibby  has a past surgical history that includes Abdominal hysterectomy; Tonsillectomy; Cesarean section; Spine surgery; Spine surgery (09/09/2015); and Back surgery. Family: family history includes Arthritis in her father; Cancer in her maternal aunt; Depression in her sister and sister; Diabetes in her mother; Heart disease in her father and maternal grandfather; Hyperlipidemia in her father; Hypertension in her father and mother; Lung disease in her father.  Constitutional Exam  General appearance: Well nourished, well developed, and well hydrated. In no apparent acute distress Vitals:   01/14/17 1321  BP: 130/80  Pulse: 89  Resp: 16  Temp: 98.1 F (36.7 C)  TempSrc: Oral  SpO2: 98%  Weight: 185 lb (83.9 kg)  Height: '5\' 6"'$  (1.676 m)   BMI Assessment: Estimated body mass index is 29.86 kg/m as calculated from the following:   Height as of this encounter: '5\' 6"'$  (1.676 m).   Weight as of this encounter: 185 lb (83.9 kg).  BMI interpretation table: BMI level Category Range association with higher incidence of chronic pain  <18 kg/m2 Underweight   18.5-24.9 kg/m2 Ideal body weight    25-29.9 kg/m2 Overweight Increased incidence by 20%  30-34.9 kg/m2 Obese (Class I) Increased incidence by 68%  35-39.9 kg/m2 Severe obesity (Class II) Increased incidence by 136%  >40 kg/m2 Extreme obesity (Class III) Increased incidence by 254%   BMI Readings from Last 4 Encounters:  01/14/17 29.86 kg/m  12/25/16 29.86 kg/m  12/17/16 29.05 kg/m  12/05/16 29.86 kg/m   Wt Readings from Last 4 Encounters:  01/14/17 185 lb (83.9 kg)  12/25/16 185 lb (83.9 kg)  12/17/16 180 lb (81.6 kg)  12/05/16 185 lb (83.9 kg)  Psych/Mental status: Alert, oriented x 3 (person, place, & time)       Eyes: PERLA Respiratory: No evidence of acute respiratory distress  Cervical Spine Area Exam  Skin & Axial Inspection: No masses, redness, edema, swelling, or associated skin lesions Alignment: Symmetrical Functional ROM: Unrestricted ROM      Stability: No instability detected Muscle Tone/Strength: Functionally intact. No obvious neuro-muscular  anomalies detected. Sensory (Neurological): Unimpaired Palpation: No palpable anomalies              Upper Extremity (UE) Exam    Side: Right upper extremity  Side: Left upper extremity  Skin & Extremity Inspection: Skin color, temperature, and hair growth are WNL. No peripheral edema or cyanosis. No masses, redness, swelling, asymmetry, or associated skin lesions. No contractures.  Skin & Extremity Inspection: Skin color, temperature, and hair growth are WNL. No peripheral edema or cyanosis. No masses, redness, swelling, asymmetry, or associated skin lesions. No contractures.  Functional ROM: Unrestricted ROM          Functional ROM: Unrestricted ROM          Muscle Tone/Strength: Functionally intact. No obvious neuro-muscular anomalies detected.  Muscle Tone/Strength: Functionally intact. No obvious neuro-muscular anomalies detected.  Sensory (Neurological): Unimpaired  Sensory (Neurological): Unimpaired  Palpation: No palpable anomalies               Palpation: No palpable anomalies              Specialized Test(s): Deferred         Specialized Test(s): Deferred          Thoracic Spine Area Exam  Skin & Axial Inspection: No masses, redness, or swelling Alignment: Symmetrical Functional ROM: Unrestricted ROM Stability: No instability detected Muscle Tone/Strength: Functionally intact. No obvious neuro-muscular anomalies detected. Sensory (Neurological): Unimpaired Muscle strength & Tone: No palpable anomalies  Lumbar Spine Area Exam  Skin & Axial Inspection: No masses, redness, or swelling Alignment: Symmetrical Functional ROM: Restricted ROM      Stability: No instability detected Muscle Tone/Strength: Functionally intact. No obvious neuro-muscular anomalies detected. Sensory (Neurological): Movement-associated discomfort Palpation: Complains of area being tender to palpation       Provocative Tests: Lumbar Hyperextension and rotation test: Positive bilaterally for facet joint pain. Lumbar Lateral bending test: evaluation deferred today       Patrick's Maneuver: evaluation deferred today                    Gait & Posture Assessment  Ambulation: Unassisted Gait: Relatively normal for age and body habitus Posture: WNL   Lower Extremity Exam    Side: Right lower extremity  Side: Left lower extremity  Skin & Extremity Inspection: Skin color, temperature, and hair growth are WNL. No peripheral edema or cyanosis. No masses, redness, swelling, asymmetry, or associated skin lesions. No contractures.  Skin & Extremity Inspection: Skin color, temperature, and hair growth are WNL. No peripheral edema or cyanosis. No masses, redness, swelling, asymmetry, or associated skin lesions. No contractures.  Functional ROM: Unrestricted ROM          Functional ROM: Unrestricted ROM          Muscle Tone/Strength: Functionally intact. No obvious neuro-muscular anomalies detected.  Muscle Tone/Strength: Functionally intact. No obvious neuro-muscular  anomalies detected.  Sensory (Neurological): Unimpaired  Sensory (Neurological): Unimpaired  Palpation: No palpable anomalies  Palpation: No palpable anomalies   Assessment  Primary Diagnosis & Pertinent Problem List: The primary encounter diagnosis was Chronic low back pain (Location of Primary Source of Pain) (midline). Diagnoses of Lumbar facet syndrome (Bilateral), Lumbosacral spondylosis without myelopathy, Failed back surgical syndrome, Neurogenic pain, Musculoskeletal pain, Chronic pain syndrome, Long term prescription opiate use, Long term (current) use of opiate analgesic, and Opiate use (85.5 MME/Day) were also pertinent to this visit.  Status Diagnosis  Persistent Persistent Controlled 1. Chronic low back pain (Location of  Primary Source of Pain) (midline)   2. Lumbar facet syndrome (Bilateral)   3. Lumbosacral spondylosis without myelopathy   4. Failed back surgical syndrome   5. Neurogenic pain   6. Musculoskeletal pain   7. Chronic pain syndrome   8. Long term prescription opiate use   9. Long term (current) use of opiate analgesic   10. Opiate use (85.5 MME/Day)     Problems updated and reviewed during this visit: Problem  Neurogenic Pain   Plan of Care  Pharmacotherapy (Medications Ordered): Meds ordered this encounter  Medications  . Oxycodone HCl 10 MG TABS    Sig: Take 1 tablet (10 mg total) by mouth every 6 (six) hours as needed.    Dispense:  120 tablet    Refill:  0    Do not place this medication, or any other prescription from our practice, on "Automatic Refill". Patient may have prescription filled one day early if pharmacy is closed on scheduled refill date. Do not fill until: 01/16/17 To last until: 02/15/17  . meloxicam (MOBIC) 15 MG tablet    Sig: Take 1 tablet (15 mg total) by mouth daily.    Dispense:  30 tablet    Refill:  0    Do not place medication on "Automatic Refill". Fill one day early if pharmacy is closed on scheduled refill date.  .  cyclobenzaprine (FLEXERIL) 10 MG tablet    Sig: Take 1 tablet (10 mg total) by mouth at bedtime.    Dispense:  30 tablet    Refill:  0    Do not place this medication, or any other prescription from our practice, on "Automatic Refill". Patient may have prescription filled one day early if pharmacy is closed on scheduled refill date.  . DULoxetine (CYMBALTA) 30 MG capsule    Sig: Take 1 capsule (30 mg total) by mouth 2 (two) times daily.    Dispense:  60 capsule    Refill:  0    Do not place medication on "Automatic Refill". Fill one day early if pharmacy is closed on scheduled refill date.  . gabapentin (NEURONTIN) 100 MG capsule    Sig: Take 1-3 capsules (100-300 mg total) by mouth at bedtime.    Dispense:  90 capsule    Refill:  0    Do not place this medication, or any other prescription from our practice, on "Automatic Refill". Patient may have prescription filled one day early if pharmacy is closed on scheduled refill date.   New Prescriptions   GABAPENTIN (NEURONTIN) 100 MG CAPSULE    Take 1-3 capsules (100-300 mg total) by mouth at bedtime.   Medications administered today: Ms. Cino had no medications administered during this visit.   Procedure Orders     LUMBAR FACET(MEDIAL BRANCH NERVE BLOCK) [facility] Lab Orders  No laboratory test(s) ordered today   Imaging Orders  No imaging studies ordered today   Referral Orders  No referral(s) requested today    Interventional management options: Planned, scheduled, and/or pending:   Diagnostic bilateral lumbar facet block(NO STEROIDS) #2   Considering:   Diagnostic bilateral lumbar facet block Possible bilateral lumbar facet RFA Diagnostic left sided caudal epidural steroid injection + diagnostic epidurogram Possible left-sided Racz procedure Diagnostic left L4-5 interlaminar lumbar epidural steroid injection Diagnosticleft L4-5 transforaminal epidural steroid injection Diagnostic left L4 selective nerve root  block Possible left L4 nerve root ganglion RFA Possible lumbar spinal cord stimulator trial   Palliative PRN treatment(s):   Diagnostic bilateral lumbar  facet block(NO STEROIDS) #2   Provider-requested follow-up: Return for Procedure (with sedation): L-FCT BLK (B).  No future appointments. Primary Care Physician: Arnetha Courser, MD Location: [facility] Note by: Gaspar Cola, MD Date: 01/14/2017; Time: 3:28 PM

## 2017-01-14 NOTE — Patient Instructions (Addendum)
____________________________________________________________________________________________  Initial Gabapentin Titration  Medication used: Gabapentin (Generic Name) or Neurontin (Brand Name) 100 mg tablets/capsules  Reasons to stop increasing the dose:  Reason 1: You get good relief of symptoms, in which case there is no need to increase the daily dose any further.    Reason 2: You develop some side effects, such as sleeping all of the time, difficulty concentrating, or becoming disoriented, in which case you need to go down on the dose, to the prior level, where you were not experiencing any side effects. Stay on that dose longer, to allow more time for your body to get use it, before attempting to increase it again.   Steps: Step 1: Start by taking 1 (one) tablet at bedtime x 7 (seven) days.  Step 2: After being on 1 (one) tablet for 7 (seven) days, then increase it to 2 (two) tablets at bedtime for another 7 (seven) days.  Step 3: Next, after being on 2 (two) tablets at bedtime for 7 (seven) days, then increase it to 3 (three) tablets at bedtime, and stay on that dose until you see your doctor.  Reasons to stop increasing the dose: Reason 1: You get good relief of symptoms, in which case there is no need to increase the daily dose any further.  Reason 2: You develop some side effects, such as sleeping all of the time, difficulty concentrating, or becoming disoriented, in which case you need to go down on the dose, to the prior level, where you were not experiencing any side effects. Stay on that dose longer, to allow more time for your body to get use it, before attempting to increase it again.  Endpoint: Once you have reached the maximum dose you can tolerate without side-effects, contact your physician so as to evaluate the results of the regimen.   Questions: Feel free to contact us for any questions or problems at [phone]  [phone] ____________________________________________________________________________________________  ____________________________________________________________________________________________  Preparing for Procedure with Sedation Instructions: . Oral Intake: Do not eat or drink anything for at least 8 hours prior to your procedure. . Transportation: Public transportation is not allowed. Bring an adult driver. The driver must be physically present in our waiting room before any procedure can be started. Marland Kitchen Physical Assistance: Bring an adult physically capable of assisting you, in the event you need help. This adult should keep you company at home for at least 6 hours after the procedure. . Blood Pressure Medicine: Take your blood pressure medicine with a sip of water the morning of the procedure. . Blood thinners:  . Diabetics on insulin: Notify the staff so that you can be scheduled 1st case in the morning. If your diabetes requires high dose insulin, take only  of your normal insulin dose the morning of the procedure and notify the staff that you have done so. . Preventing infections: Shower with an antibacterial soap the morning of your procedure. . Build-up your immune system: Take 1000 mg of Vitamin C with every meal (3 times a day) the day prior to your procedure. Marland Kitchen Antibiotics: Inform the staff if you have a condition or reason that requires you to take antibiotics before dental procedures. . Pregnancy: If you are pregnant, call and cancel the procedure. . Sickness: If you have a cold, fever, or any active infections, call and cancel the procedure. . Arrival: You must be in the facility at least 30 minutes prior to your scheduled procedure. . Children: Do not bring children with you. Marland Kitchen  Dress appropriately: Bring dark clothing that you would not mind if they get stained. . Valuables: Do not bring any jewelry or valuables. Procedure appointments are reserved for interventional  treatments only. Marland Kitchen No Prescription Refills. . No medication changes will be discussed during procedure appointments. . No disability issues will be discussed. ____________________________________________________________________________________________  GENERAL RISKS AND COMPLICATIONS  What are the risk, side effects and possible complications? Generally speaking, most procedures are safe.  However, with any procedure there are risks, side effects, and the possibility of complications.  The risks and complications are dependent upon the sites that are lesioned, or the type of nerve block to be performed.  The closer the procedure is to the spine, the more serious the risks are.  Great care is taken when placing the radio frequency needles, block needles or lesioning probes, but sometimes complications can occur. 1. Infection: Any time there is an injection through the skin, there is a risk of infection.  This is why sterile conditions are used for these blocks.  There are four possible types of infection. 1. Localized skin infection. 2. Central Nervous System Infection-This can be in the form of Meningitis, which can be deadly. 3. Epidural Infections-This can be in the form of an epidural abscess, which can cause pressure inside of the spine, causing compression of the spinal cord with subsequent paralysis. This would require an emergency surgery to decompress, and there are no guarantees that the patient would recover from the paralysis. 4. Discitis-This is an infection of the intervertebral discs.  It occurs in about 1% of discography procedures.  It is difficult to treat and it may lead to surgery.        2. Pain: the needles have to go through skin and soft tissues, will cause soreness.       3. Damage to internal structures:  The nerves to be lesioned may be near blood vessels or    other nerves which can be potentially damaged.       4. Bleeding: Bleeding is more common if the patient is  taking blood thinners such as  aspirin, Coumadin, Ticiid, Plavix, etc., or if he/she have some genetic predisposition  such as hemophilia. Bleeding into the spinal canal can cause compression of the spinal  cord with subsequent paralysis.  This would require an emergency surgery to  decompress and there are no guarantees that the patient would recover from the  paralysis.       5. Pneumothorax:  Puncturing of a lung is a possibility, every time a needle is introduced in  the area of the chest or upper back.  Pneumothorax refers to free air around the  collapsed lung(s), inside of the thoracic cavity (chest cavity).  Another two possible  complications related to a similar event would include: Hemothorax and Chylothorax.   These are variations of the Pneumothorax, where instead of air around the collapsed  lung(s), you may have blood or chyle, respectively.       6. Spinal headaches: They may occur with any procedures in the area of the spine.       7. Persistent CSF (Cerebro-Spinal Fluid) leakage: This is a rare problem, but may occur  with prolonged intrathecal or epidural catheters either due to the formation of a fistulous  track or a dural tear.       8. Nerve damage: By working so close to the spinal cord, there is always a possibility of  nerve damage, which could be as  serious as a permanent spinal cord injury with  paralysis.       9. Death:  Although rare, severe deadly allergic reactions known as "Anaphylactic  reaction" can occur to any of the medications used.      10. Worsening of the symptoms:  We can always make thing worse.  What are the chances of something like this happening? Chances of any of this occuring are extremely low.  By statistics, you have more of a chance of getting killed in a motor vehicle accident: while driving to the hospital than any of the above occurring .  Nevertheless, you should be aware that they are possibilities.  In general, it is similar to taking a shower.   Everybody knows that you can slip, hit your head and get killed.  Does that mean that you should not shower again?  Nevertheless always keep in mind that statistics do not mean anything if you happen to be on the wrong side of them.  Even if a procedure has a 1 (one) in a 1,000,000 (million) chance of going wrong, it you happen to be that one..Also, keep in mind that by statistics, you have more of a chance of having something go wrong when taking medications.  Who should not have this procedure? If you are on a blood thinning medication (e.g. Coumadin, Plavix, see list of "Blood Thinners"), or if you have an active infection going on, you should not have the procedure.  If you are taking any blood thinners, please inform your physician.  How should I prepare for this procedure?  Do not eat or drink anything at least six hours prior to the procedure.  Bring a driver with you .  It cannot be a taxi.  Come accompanied by an adult that can drive you back, and that is strong enough to help you if your legs get weak or numb from the local anesthetic.  Take all of your medicines the morning of the procedure with just enough water to swallow them.  If you have diabetes, make sure that you are scheduled to have your procedure done first thing in the morning, whenever possible.  If you have diabetes, take only half of your insulin dose and notify our nurse that you have done so as soon as you arrive at the clinic.  If you are diabetic, but only take blood sugar pills (oral hypoglycemic), then do not take them on the morning of your procedure.  You may take them after you have had the procedure.  Do not take aspirin or any aspirin-containing medications, at least eleven (11) days prior to the procedure.  They may prolong bleeding.  Wear loose fitting clothing that may be easy to take off and that you would not mind if it got stained with Betadine or blood.  Do not wear any jewelry or perfume  Remove  any nail coloring.  It will interfere with some of our monitoring equipment.  NOTE: Remember that this is not meant to be interpreted as a complete list of all possible complications.  Unforeseen problems may occur.  BLOOD THINNERS The following drugs contain aspirin or other products, which can cause increased bleeding during surgery and should not be taken for 2 weeks prior to and 1 week after surgery.  If you should need take something for relief of minor pain, you may take acetaminophen which is found in Tylenol,m Datril, Anacin-3 and Panadol. It is not blood thinner. The products listed below are.  Do not take any of the products listed below in addition to any listed on your instruction sheet.  A.P.C or A.P.C with Codeine Codeine Phosphate Capsules #3 Ibuprofen Ridaura  ABC compound Congesprin Imuran rimadil  Advil Cope Indocin Robaxisal  Alka-Seltzer Effervescent Pain Reliever and Antacid Coricidin or Coricidin-D  Indomethacin Rufen  Alka-Seltzer plus Cold Medicine Cosprin Ketoprofen S-A-C Tablets  Anacin Analgesic Tablets or Capsules Coumadin Korlgesic Salflex  Anacin Extra Strength Analgesic tablets or capsules CP-2 Tablets Lanoril Salicylate  Anaprox Cuprimine Capsules Levenox Salocol  Anexsia-D Dalteparin Magan Salsalate  Anodynos Darvon compound Magnesium Salicylate Sine-off  Ansaid Dasin Capsules Magsal Sodium Salicylate  Anturane Depen Capsules Marnal Soma  APF Arthritis pain formula Dewitt's Pills Measurin Stanback  Argesic Dia-Gesic Meclofenamic Sulfinpyrazone  Arthritis Bayer Timed Release Aspirin Diclofenac Meclomen Sulindac  Arthritis pain formula Anacin Dicumarol Medipren Supac  Analgesic (Safety coated) Arthralgen Diffunasal Mefanamic Suprofen  Arthritis Strength Bufferin Dihydrocodeine Mepro Compound Suprol  Arthropan liquid Dopirydamole Methcarbomol with Aspirin Synalgos  ASA tablets/Enseals Disalcid Micrainin Tagament  Ascriptin Doan's Midol Talwin  Ascriptin A/D  Dolene Mobidin Tanderil  Ascriptin Extra Strength Dolobid Moblgesic Ticlid  Ascriptin with Codeine Doloprin or Doloprin with Codeine Momentum Tolectin  Asperbuf Duoprin Mono-gesic Trendar  Aspergum Duradyne Motrin or Motrin IB Triminicin  Aspirin plain, buffered or enteric coated Durasal Myochrisine Trigesic  Aspirin Suppositories Easprin Nalfon Trillsate  Aspirin with Codeine Ecotrin Regular or Extra Strength Naprosyn Uracel  Atromid-S Efficin Naproxen Ursinus  Auranofin Capsules Elmiron Neocylate Vanquish  Axotal Emagrin Norgesic Verin  Azathioprine Empirin or Empirin with Codeine Normiflo Vitamin E  Azolid Emprazil Nuprin Voltaren  Bayer Aspirin plain, buffered or children's or timed BC Tablets or powders Encaprin Orgaran Warfarin Sodium  Buff-a-Comp Enoxaparin Orudis Zorpin  Buff-a-Comp with Codeine Equegesic Os-Cal-Gesic   Buffaprin Excedrin plain, buffered or Extra Strength Oxalid   Bufferin Arthritis Strength Feldene Oxphenbutazone   Bufferin plain or Extra Strength Feldene Capsules Oxycodone with Aspirin   Bufferin with Codeine Fenoprofen Fenoprofen Pabalate or Pabalate-SF   Buffets II Flogesic Panagesic   Buffinol plain or Extra Strength Florinal or Florinal with Codeine Panwarfarin   Buf-Tabs Flurbiprofen Penicillamine   Butalbital Compound Four-way cold tablets Penicillin   Butazolidin Fragmin Pepto-Bismol   Carbenicillin Geminisyn Percodan   Carna Arthritis Reliever Geopen Persantine   Carprofen Gold's salt Persistin   Chloramphenicol Goody's Phenylbutazone   Chloromycetin Haltrain Piroxlcam   Clmetidine heparin Plaquenil   Cllnoril Hyco-pap Ponstel   Clofibrate Hydroxy chloroquine Propoxyphen         Before stopping any of these medications, be sure to consult the physician who ordered them.  Some, such as Coumadin (Warfarin) are ordered to prevent or treat serious conditions such as "deep thrombosis", "pumonary embolisms", and other heart problems.  The amount of time  that you may need off of the medication may also vary with the medication and the reason for which you were taking it.  If you are taking any of these medications, please make sure you notify your pain physician before you undergo any procedures.         Facet Blocks Patient Information  Description: The facets are joints in the spine between the vertebrae.  Like any joints in the body, facets can become irritated and painful.  Arthritis can also effect the facets.  By injecting steroids and local anesthetic in and around these joints, we can temporarily block the nerve supply to them.  Steroids act directly on irritated nerves and tissues to reduce selling  and inflammation which often leads to decreased pain.  Facet blocks may be done anywhere along the spine from the neck to the low back depending upon the location of your pain.   After numbing the skin with local anesthetic (like Novocaine), a small needle is passed onto the facet joints under x-Umar guidance.  You may experience a sensation of pressure while this is being done.  The entire block usually lasts about 15-25 minutes.   Conditions which may be treated by facet blocks:   Low back/buttock pain  Neck/shoulder pain  Certain types of headaches  Preparation for the injection:  1. Do not eat any solid food or dairy products within 8 hours of your appointment. 2. You may drink clear liquid up to 3 hours before appointment.  Clear liquids include water, black coffee, juice or soda.  No milk or cream please. 3. You may take your regular medication, including pain medications, with a sip of water before your appointment.  Diabetics should hold regular insulin (if taken separately) and take 1/2 normal NPH dose the morning of the procedure.  Carry some sugar containing items with you to your appointment. 4. A driver must accompany you and be prepared to drive you home after your procedure. 5. Bring all your current medications with  you. 6. An IV may be inserted and sedation may be given at the discretion of the physician. 7. A blood pressure cuff, EKG and other monitors will often be applied during the procedure.  Some patients may need to have extra oxygen administered for a short period. 8. You will be asked to provide medical information, including your allergies and medications, prior to the procedure.  We must know immediately if you are taking blood thinners (like Coumadin/Warfarin) or if you are allergic to IV iodine contrast (dye).  We must know if you could possible be pregnant.  Possible side-effects:   Bleeding from needle site  Infection (rare, may require surgery)  Nerve injury (rare)  Numbness & tingling (temporary)  Difficulty urinating (rare, temporary)  Spinal headache (a headache worse with upright posture)  Light-headedness (temporary)  Pain at injection site (serveral days)  Decreased blood pressure (rare, temporary)  Weakness in arm/leg (temporary)  Pressure sensation in back/neck (temporary)   Call if you experience:   Fever/chills associated with headache or increased back/neck pain  Headache worsened by an upright position  New onset, weakness or numbness of an extremity below the injection site  Hives or difficulty breathing (go to the emergency room)  Inflammation or drainage at the injection site(s)  Severe back/neck pain greater than usual  New symptoms which are concerning to you  Please note:  Although the local anesthetic injected can often make your back or neck feel good for several hours after the injection, the pain will likely return. It takes 3-7 days for steroids to work.  You may not notice any pain relief for at least one week.  If effective, we will often do a series of 2-3 injections spaced 3-6 weeks apart to maximally decrease your pain.  After the initial series, you may be a candidate for a more permanent nerve block of the facets.  If you have  any questions, please call #[phone] [facility]

## 2017-01-14 NOTE — Progress Notes (Signed)
Nursing Pain Medication Assessment:  Safety precautions to be maintained throughout the outpatient stay will include: orient to surroundings, keep bed in low position, maintain call bell within reach at all times, provide assistance with transfer out of bed and ambulation.  Medication Inspection Compliance: Pill count conducted under aseptic conditions, in front of the patient. Neither the pills nor the bottle was removed from the patient's sight at any time. Once count was completed pills were immediately returned to the patient in their original bottle.  Medication: See above Pill/Patch Count: 1 of 120 pills remain Pill/Patch Appearance: Markings consistent with prescribed medication Bottle Appearance: Standard pharmacy container. Clearly labeled. Filled Date: 07 / 10 / 2018 Last Medication intake:  Today

## 2017-01-30 ENCOUNTER — Ambulatory Visit (HOSPITAL_BASED_OUTPATIENT_CLINIC_OR_DEPARTMENT_OTHER): Payer: Managed Care, Other (non HMO) | Admitting: Pain Medicine

## 2017-01-30 ENCOUNTER — Ambulatory Visit
Admission: RE | Admit: 2017-01-30 | Discharge: 2017-01-30 | Disposition: A | Discharge status: Home / Self Care | Payer: Managed Care, Other (non HMO) | Source: Ambulatory Visit | Attending: Pain Medicine | Admitting: Pain Medicine

## 2017-01-30 ENCOUNTER — Encounter: Payer: Self-pay | Admitting: Pain Medicine

## 2017-01-30 VITALS — BP 132/92 | HR 81 | Temp 97.7°F | Resp 16 | Ht 66.0 in | Wt 180.0 lb

## 2017-01-30 DIAGNOSIS — M4696 Unspecified inflammatory spondylopathy, lumbar region: Secondary | ICD-10-CM | POA: Diagnosis present

## 2017-01-30 DIAGNOSIS — F329 Major depressive disorder, single episode, unspecified: Secondary | ICD-10-CM

## 2017-01-30 DIAGNOSIS — M47816 Spondylosis without myelopathy or radiculopathy, lumbar region: Secondary | ICD-10-CM | POA: Insufficient documentation

## 2017-01-30 DIAGNOSIS — M5442 Lumbago with sciatica, left side: Secondary | ICD-10-CM | POA: Diagnosis present

## 2017-01-30 DIAGNOSIS — G8929 Other chronic pain: Secondary | ICD-10-CM

## 2017-01-30 DIAGNOSIS — F32A Depression, unspecified: Secondary | ICD-10-CM

## 2017-01-30 DIAGNOSIS — F39 Unspecified mood [affective] disorder: Secondary | ICD-10-CM | POA: Insufficient documentation

## 2017-01-30 MED ORDER — ROPIVACAINE HCL 2 MG/ML IJ SOLN
10.0000 mL | Freq: Once | INTRAMUSCULAR | Status: AC
  Administered 2017-01-30: 10 mL via PERINEURAL
  Filled 2017-01-30: qty 10

## 2017-01-30 MED ORDER — LACTATED RINGERS IV SOLN
1000.0000 mL | Freq: Once | INTRAVENOUS | Status: AC
  Administered 2017-01-30: 1000 mL via INTRAVENOUS

## 2017-01-30 MED ORDER — MIDAZOLAM HCL 5 MG/5ML IJ SOLN
1.0000 mg | INTRAMUSCULAR | Status: DC | PRN
  Administered 2017-01-30: 2 mg via INTRAVENOUS
  Filled 2017-01-30: qty 5

## 2017-01-30 MED ORDER — LIDOCAINE HCL 2 % IJ SOLN
10.0000 mL | Freq: Once | INTRAMUSCULAR | Status: AC
  Administered 2017-01-30: 400 mg
  Filled 2017-01-30: qty 40

## 2017-01-30 MED ORDER — FENTANYL CITRATE (PF) 100 MCG/2ML IJ SOLN
25.0000 ug | INTRAMUSCULAR | Status: DC | PRN
  Administered 2017-01-30: 50 ug via INTRAVENOUS
  Filled 2017-01-30: qty 2

## 2017-01-30 NOTE — Patient Instructions (Addendum)
____________________________________________________________________________________________  Post-Procedure instructions Instructions:  Apply ice: Fill a plastic sandwich bag with crushed ice. Cover it with a small towel and apply to injection site. Apply for 15 minutes then remove x 15 minutes. Repeat sequence on day of procedure, until you go to bed. The purpose is to minimize swelling and discomfort after procedure.  Apply heat: Apply heat to procedure site starting the day following the procedure. The purpose is to treat any soreness and discomfort from the procedure.  Food intake: Start with clear liquids (like water) and advance to regular food, as tolerated.   Physical activities: Keep activities to a minimum for the first 8 hours after the procedure.   Driving: If you have received any sedation, you are not allowed to drive for 24 hours after your procedure.  Blood thinner: Restart your blood thinner 6 hours after your procedure. (Only for those taking blood thinners)  Insulin: As soon as you can eat, you may resume your normal dosing schedule. (Only for those taking insulin)  Infection prevention: Keep procedure site clean and dry.  Post-procedure Pain Diary: Extremely important that this be done correctly and accurately. Recorded information will be used to determine the next step in treatment.  Pain evaluated is that of treated area only. Do not include pain from an untreated area.  Complete every hour, on the hour, for the initial 8 hours. Set an alarm to help you do this part accurately.  Do not go to sleep and have it completed later. It will not be accurate.  Follow-up appointment: Keep your follow-up appointment after the procedure. Usually 2 weeks for most procedures. (6 weeks in the case of radiofrequency.) Bring you pain diary.  Expect:  From numbing medicine (AKA: Local Anesthetics): Numbness or decrease in pain.  Onset: Full effect within 15 minutes of  injected.  Duration: It will depend on the type of local anesthetic used. On the average, 1 to 8 hours.   From steroids: NO STEROIDS INJECTED TODAY.   From procedure: Some discomfort is to be expected once the numbing medicine wears off. This should be minimal if ice and heat are applied as instructed. Call if:  You experience numbness and weakness that gets worse with time, as opposed to wearing off.  New onset bowel or bladder incontinence. (Spinal procedures only)  Emergency Numbers:  Durning business hours (Monday - Thursday, 8:00 AM - 4:00 PM) (Friday, 9:00 AM - 12:00 Noon): [phone]  After hours: [phone] ____________________________________________________________________________________________  Pain Management Discharge Instructions  General Discharge Instructions :  If you need to reach your doctor call: Monday-Friday 8:00 am - 4:00 pm at [phone] or toll free [phone].  After clinic hours [phone] to have operator reach doctor.  Bring all of your medication bottles to all your appointments in the pain clinic.  To cancel or reschedule your appointment with Pain Management please remember to call 24 hours in advance to avoid a fee.  Refer to the educational materials which you have been given on: General Risks, I had my Procedure. Discharge Instructions, Post Sedation.  Post Procedure Instructions:  The drugs you were given will stay in your system until tomorrow, so for the next 24 hours you should not drive, make any legal decisions or drink any alcoholic beverages.  You may eat anything you prefer, but it is better to start with liquids then soups and crackers, and gradually work up to solid foods.  Please notify your doctor immediately if you have any unusual  bleeding, trouble breathing or pain that is not related to your normal pain.  Depending on the type of procedure that was done, some parts of your body may feel week and/or numb.   This usually clears up by tonight or the next day.  Walk with the use of an assistive device or accompanied by an adult for the 24 hours.  You may use ice on the affected area for the first 24 hours.  Put ice in a Ziploc bag and cover with a towel and place against area 15 minutes on 15 minutes off.  You may switch to heat after 24 hours.

## 2017-01-30 NOTE — Progress Notes (Signed)
Patient's Name: [name]  MRN: [id number]  Referring Provider: Milinda Pointer, MD  DOB: 09/28/1970  PCP: Arnetha Courser, MD  DOS: 01/30/2017  Note by: Gaspar Cola, MD  Service setting: Ambulatory outpatient  Specialty: Interventional Pain Management  Patient type: Established  Location: [name] (AMB) Pain Management Facility  Visit type: Interventional Procedure   Primary Reason for Visit: Interventional Pain Management Treatment. CC: Back Pain (lower bilateral)  Procedure:  Anesthesia, Analgesia, Anxiolysis:  Type: Diagnostic Medial Branch Facet Block #2 (NO STEROIDS) Region: Lumbar Level: L2, L3, L4, L5, & S1 Medial Branch Level(s) Laterality: Bilateral  Type: Local Anesthesia with Moderate (Conscious) Sedation Local Anesthetic: Lidocaine 1% Route: Intravenous (IV) IV Access: Secured Sedation: Meaningful verbal contact was maintained at all times during the procedure  Indication(s): Analgesia and Anxiety  Indications: 1. Lumbar facet syndrome (Bilateral)   2. Chronic low back pain (Location of Primary Source of Pain) (midline)   3. Lumbar spondylosis   4. Depression, unspecified depression type    Pain Score: Pre-procedure: 2 /10 Post-procedure: 0-No pain/10  Pre-op Assessment:  [name] is a 46 y.o. (year old), female patient, seen today for interventional treatment. She  has a past surgical history that includes Abdominal hysterectomy; Tonsillectomy; Cesarean section; Spine surgery; Spine surgery (09/09/2015); and Back surgery. [name] has a current medication list which includes the following prescription(s): cyclobenzaprine, duloxetine, gabapentin, meloxicam, and oxycodone hcl, and the following Facility-Administered Medications: fentanyl and midazolam. Her primarily concern today is the Back Pain (lower bilateral)  Initial Vital Signs: Blood pressure (!) 132/95, pulse 84, temperature 98.6 F (37 C), temperature source Oral, resp. rate 16, height 5\' 6"  (1.676 m),  weight 180 lb (81.6 kg), SpO2 99 %. BMI: Estimated body mass index is 29.05 kg/m as calculated from the following:   Height as of this encounter: 5\' 6"  (1.676 m).   Weight as of this encounter: 180 lb (81.6 kg).  Risk Assessment: Allergies: Reviewed. She is allergic to codeine.  Allergy Precautions: None required Coagulopathies: Reviewed. None identified.  Blood-thinner therapy: None at this time Active Infection(s): Reviewed. None identified. [name] is afebrile  Site Confirmation: [name] was asked to confirm the procedure and laterality before marking the site Procedure checklist: Completed Consent: Before the procedure and under the influence of no sedative(s), amnesic(s), or anxiolytics, the patient was informed of the treatment options, risks and possible complications. To fulfill our ethical and legal obligations, as recommended by the American Medical Association's Code of Ethics, I have informed the patient of my clinical impression; the nature and purpose of the treatment or procedure; the risks, benefits, and possible complications of the intervention; the alternatives, including doing nothing; the risk(s) and benefit(s) of the alternative treatment(s) or procedure(s); and the risk(s) and benefit(s) of doing nothing. The patient was provided information about the general risks and possible complications associated with the procedure. These may include, but are not limited to: failure to achieve desired goals, infection, bleeding, organ or nerve damage, allergic reactions, paralysis, and death. In addition, the patient was informed of those risks and complications associated to Spine-related procedures, such as failure to decrease pain; infection (i.e.: Meningitis, epidural or intraspinal abscess); bleeding (i.e.: epidural hematoma, subarachnoid hemorrhage, or any other type of intraspinal or peri-dural bleeding); organ or nerve damage (i.e.: Any type of peripheral nerve, nerve root, or  spinal cord injury) with subsequent damage to sensory, motor, and/or autonomic systems, resulting in permanent pain, numbness, and/or weakness of one or several areas  of the body; allergic reactions; (i.e.: anaphylactic reaction); and/or death. Furthermore, the patient was informed of those risks and complications associated with the medications. These include, but are not limited to: allergic reactions (i.e.: anaphylactic or anaphylactoid reaction(s)); adrenal axis suppression; blood sugar elevation that in diabetics may result in ketoacidosis or comma; water retention that in patients with history of congestive heart failure may result in shortness of breath, pulmonary edema, and decompensation with resultant heart failure; weight gain; swelling or edema; medication-induced neural toxicity; particulate matter embolism and blood vessel occlusion with resultant organ, and/or nervous system infarction; and/or aseptic necrosis of one or more joints. Finally, the patient was informed that Medicine is not an exact science; therefore, there is also the possibility of unforeseen or unpredictable risks and/or possible complications that may result in a catastrophic outcome. The patient indicated having understood very clearly. We have given the patient no guarantees and we have made no promises. Enough time was given to the patient to ask questions, all of which were answered to the patient's satisfaction. [name] has indicated that she wanted to continue with the procedure. Attestation: I, the ordering provider, attest that I have discussed with the patient the benefits, risks, side-effects, alternatives, likelihood of achieving goals, and potential problems during recovery for the procedure that I have provided informed consent. Date: 01/30/2017; Time: 9:18 AM  Pre-Procedure Preparation:  Monitoring: As per clinic protocol. Respiration, ETCO2, SpO2, BP, heart rate and rhythm monitor placed and checked for adequate  function Safety Precautions: Patient was assessed for positional comfort and pressure points before starting the procedure. Time-out: I initiated and conducted the "Time-out" before starting the procedure, as per protocol. The patient was asked to participate by confirming the accuracy of the "Time Out" information. Verification of the correct person, site, and procedure were performed and confirmed by me, the nursing staff, and the patient. "Time-out" conducted as per Joint Commission's Universal Protocol (UP.01.01.01). "Time-out" Date & Time: 01/30/2017;   hrs.  Description of Procedure Process:   Position: Prone Target Area: For Lumbar Facet blocks, the target is the groove formed by the junction of the transverse process and superior articular process. For the L5 dorsal ramus, the target is the notch between superior articular process and sacral ala. For the S1 dorsal ramus, the target is the superior and lateral edge of the posterior S1 Sacral foramen. Approach: Paramedial approach. Area Prepped: Entire Posterior Lumbosacral Region Prepping solution: ChloraPrep (2% chlorhexidine gluconate and 70% isopropyl alcohol) Safety Precautions: Aspiration looking for blood return was conducted prior to all injections. At no point did we inject any substances, as a needle was being advanced. No attempts were made at seeking any paresthesias. Safe injection practices and needle disposal techniques used. Medications properly checked for expiration dates. SDV (single dose vial) medications used. Description of the Procedure: Protocol guidelines were followed. The patient was placed in position over the fluoroscopy table. The target area was identified and the area prepped in the usual manner. Skin desensitized using vapocoolant spray. Skin & deeper tissues infiltrated with local anesthetic. Appropriate amount of time allowed to pass for local anesthetics to take effect. The procedure needle was introduced through  the skin, ipsilateral to the reported pain, and advanced to the target area. Employing the "Medial Branch Technique", the needles were advanced to the angle made by the superior and medial portion of the transverse process, and the lateral and inferior portion of the superior articulating process of the targeted vertebral bodies. This area is  known as "Burton's Eye" or the "Eye of the [place] Dog". A procedure needle was introduced through the skin, and this time advanced to the angle made by the superior and medial border of the sacral ala, and the lateral border of the S1 vertebral body. This last needle was later repositioned at the superior and lateral border of the posterior S1 foramen. Negative aspiration confirmed. Solution injected in intermittent fashion, asking for systemic symptoms every 0.5cc of injectate. The needles were then removed and the area cleansed, making sure to leave some of the prepping solution back to take advantage of its long term bactericidal properties.   Illustration of the posterior view of the lumbar spine and the posterior neural structures. Laminae of L2 through S1 are labeled. DPRL5, dorsal primary ramus of L5; DPRS1, dorsal primary ramus of S1; DPR3, dorsal primary ramus of L3; FJ, facet (zygapophyseal) joint L3-L4; I, inferior articular process of L4; LB1, lateral branch of dorsal primary ramus of L1; IAB, inferior articular branches from L3 medial branch (supplies L4-L5 facet joint); IBP, intermediate branch plexus; MB3, medial branch of dorsal primary ramus of L3; NR3, third lumbar nerve root; S, superior articular process of L5; SAB, superior articular branches from L4 (supplies L4-5 facet joint also); TP3, transverse process of L3.  Vitals:   01/30/17 1002 01/30/17 1010 01/30/17 1020 01/30/17 1030  BP: (!) 125/97 118/87 (!) 125/95 (!) 132/92  Pulse: 81     Resp: 10 10 20 16   Temp:  97.7 F (36.5 C)    TempSrc:  Tympanic    SpO2: 99% 94% 97% 96%  Weight:        Height:        Start Time:   hrs. End Time: 1001 hrs. Materials:  Needle(s) Type: Regular needle Gauge: 22G Length: 3.5-in Medication(s): We administered lactated ringers, midazolam, fentaNYL, lidocaine, ropivacaine (PF) 2 mg/mL (0.2%), and ropivacaine (PF) 2 mg/mL (0.2%). Please see chart orders for dosing details.  Imaging Guidance (Spinal):  Type of Imaging Technique: Fluoroscopy Guidance (Spinal) Indication(s): Assistance in needle guidance and placement for procedures requiring needle placement in or near specific anatomical locations not easily accessible without such assistance. Exposure Time: Please see nurses notes. Contrast: None used. Fluoroscopic Guidance: I was personally present during the use of fluoroscopy. "Tunnel Vision Technique" used to obtain the best possible view of the target area. Parallax error corrected before commencing the procedure. "Direction-depth-direction" technique used to introduce the needle under continuous pulsed fluoroscopy. Once target was reached, antero-posterior, oblique, and lateral fluoroscopic projection used confirm needle placement in all planes. Images permanently stored in EMR. Interpretation: No contrast injected. I personally interpreted the imaging intraoperatively. Adequate needle placement confirmed in multiple planes. Permanent images saved into the patient's record.  Antibiotic Prophylaxis:  Indication(s): None identified Antibiotic given: None  Post-operative Assessment:  EBL: None Complications: No immediate post-treatment complications observed by team, or reported by patient. Note: The patient tolerated the entire procedure well. A repeat set of vitals were taken after the procedure and the patient was kept under observation following institutional policy, for this type of procedure. Post-procedural neurological assessment was performed, showing return to baseline, prior to discharge. The patient was provided with post-procedure  discharge instructions, including a section on how to identify potential problems. Should any problems arise concerning this procedure, the patient was given instructions to immediately contact us, at any time, without hesitation. In any case, we plan to contact the patient by telephone for a follow-up status report regarding this  interventional procedure. Comments:  No additional relevant information.  Plan of Care  Disposition: Discharge home  Discharge Date & Time: 01/30/2017; 1032 hrs.  Physician-requested Follow-up:  Return for post-procedure eval by Dr. Dossie Arbour in 2 weeks.  Future Appointments Date Time Provider [place]  03/03/2017 1:45 PM Milinda Pointer, MD [facility] None    Imaging Orders     DG C-Arm 1-60 Min-No Report Procedure Orders    No procedure(s) ordered today    Medications ordered for procedure: Meds ordered this encounter  Medications  . lactated ringers infusion 1,000 mL  . midazolam (VERSED) 5 MG/5ML injection 1-2 mg    Make sure Flumazenil is available in the pyxis when using this medication. If oversedation occurs, administer 0.2 mg IV over 15 sec. If after 45 sec no response, administer 0.2 mg again over 1 min; may repeat at 1 min intervals; not to exceed 4 doses (1 mg)  . fentaNYL (SUBLIMAZE) injection 25-50 mcg    Make sure Narcan is available in the pyxis when using this medication. In the event of respiratory depression (RR< 8/min): Titrate NARCAN (naloxone) in increments of 0.1 to 0.2 mg IV at 2-3 minute intervals, until desired degree of reversal.  . lidocaine (XYLOCAINE) 2 % (with pres) injection 200 mg  . ropivacaine (PF) 2 mg/mL (0.2%) (NAROPIN) injection 10 mL  . ropivacaine (PF) 2 mg/mL (0.2%) (NAROPIN) injection 10 mL   Medications administered: We administered lactated ringers, midazolam, fentaNYL, lidocaine, ropivacaine (PF) 2 mg/mL (0.2%), and ropivacaine (PF) 2 mg/mL (0.2%).  See the medical record for exact dosing, route, and  time of administration.  New Prescriptions   No medications on file   Primary Care Physician: Arnetha Courser, MD Location: [facility] Note by: Gaspar Cola, MD Date: 01/30/2017; Time: 10:49 AM  Disclaimer:  Medicine is not an exact science. The only guarantee in medicine is that nothing is guaranteed. It is important to note that the decision to proceed with this intervention was based on the information collected from the patient. The Data and conclusions were drawn from the patient's questionnaire, the interview, and the physical examination. Because the information was provided in large part by the patient, it cannot be guaranteed that it has not been purposely or unconsciously manipulated. Every effort has been made to obtain as much relevant data as possible for this evaluation. It is important to note that the conclusions that lead to this procedure are derived in large part from the available data. Always take into account that the treatment will also be dependent on availability of resources and existing treatment guidelines, considered by other Pain Management Practitioners as being common knowledge and practice, at the time of the intervention. For Medico-Legal purposes, it is also important to point out that variation in procedural techniques and pharmacological choices are the acceptable norm. The indications, contraindications, technique, and results of the above procedure should only be interpreted and judged by a Board-Certified Interventional Pain Specialist with extensive familiarity and expertise in the same exact procedure and technique.

## 2017-01-30 NOTE — Progress Notes (Signed)
Safety precautions to be maintained throughout the outpatient stay will include: orient to surroundings, keep bed in low position, maintain call bell within reach at all times, provide assistance with transfer out of bed and ambulation.   Last pain medicine today @ 0700

## 2017-01-31 ENCOUNTER — Telehealth: Payer: Self-pay

## 2017-01-31 NOTE — Telephone Encounter (Signed)
Post procedure phone call.  Left message.  

## 2017-02-07 ENCOUNTER — Telehealth: Payer: Self-pay | Admitting: *Deleted

## 2017-02-13 ENCOUNTER — Telehealth (HOSPITAL_COMMUNITY): Payer: Self-pay

## 2017-02-17 ENCOUNTER — Ambulatory Visit: Payer: Managed Care, Other (non HMO) | Attending: Nurse Practitioner | Admitting: Nurse Practitioner

## 2017-02-17 ENCOUNTER — Encounter: Payer: Self-pay | Admitting: Nurse Practitioner

## 2017-02-17 VITALS — BP 130/87 | HR 72 | Temp 97.2°F | Resp 16 | Ht 66.0 in | Wt 180.0 lb

## 2017-02-17 DIAGNOSIS — Z885 Allergy status to narcotic agent status: Secondary | ICD-10-CM | POA: Insufficient documentation

## 2017-02-17 DIAGNOSIS — M4696 Unspecified inflammatory spondylopathy, lumbar region: Secondary | ICD-10-CM

## 2017-02-17 DIAGNOSIS — F17210 Nicotine dependence, cigarettes, uncomplicated: Secondary | ICD-10-CM | POA: Diagnosis not present

## 2017-02-17 DIAGNOSIS — G894 Chronic pain syndrome: Secondary | ICD-10-CM | POA: Diagnosis not present

## 2017-02-17 DIAGNOSIS — M961 Postlaminectomy syndrome, not elsewhere classified: Secondary | ICD-10-CM

## 2017-02-17 DIAGNOSIS — F419 Anxiety disorder, unspecified: Secondary | ICD-10-CM | POA: Insufficient documentation

## 2017-02-17 DIAGNOSIS — M47816 Spondylosis without myelopathy or radiculopathy, lumbar region: Secondary | ICD-10-CM | POA: Diagnosis not present

## 2017-02-17 DIAGNOSIS — M5442 Lumbago with sciatica, left side: Secondary | ICD-10-CM | POA: Diagnosis not present

## 2017-02-17 DIAGNOSIS — G8929 Other chronic pain: Secondary | ICD-10-CM

## 2017-02-17 DIAGNOSIS — M48061 Spinal stenosis, lumbar region without neurogenic claudication: Secondary | ICD-10-CM | POA: Diagnosis not present

## 2017-02-17 DIAGNOSIS — F4321 Adjustment disorder with depressed mood: Secondary | ICD-10-CM | POA: Insufficient documentation

## 2017-02-17 DIAGNOSIS — Z833 Family history of diabetes mellitus: Secondary | ICD-10-CM | POA: Insufficient documentation

## 2017-02-17 DIAGNOSIS — M7918 Myalgia, other site: Secondary | ICD-10-CM

## 2017-02-17 DIAGNOSIS — M792 Neuralgia and neuritis, unspecified: Secondary | ICD-10-CM

## 2017-02-17 DIAGNOSIS — Z8249 Family history of ischemic heart disease and other diseases of the circulatory system: Secondary | ICD-10-CM | POA: Insufficient documentation

## 2017-02-17 DIAGNOSIS — Z5181 Encounter for therapeutic drug level monitoring: Secondary | ICD-10-CM | POA: Diagnosis present

## 2017-02-17 DIAGNOSIS — M791 Myalgia: Secondary | ICD-10-CM

## 2017-02-17 MED ORDER — DULOXETINE HCL 30 MG PO CPEP: 30.0000 mg | ORAL_CAPSULE | Freq: Two times a day (BID) | ORAL | 0 refills | Status: DC

## 2017-02-17 MED ORDER — MELOXICAM 15 MG PO TABS: 15.0000 mg | ORAL_TABLET | Freq: Every day | ORAL | 0 refills | Status: DC

## 2017-02-17 MED ORDER — GABAPENTIN 100 MG PO CAPS: 100.0000 mg | ORAL_CAPSULE | Freq: Every day | ORAL | 0 refills | Status: DC

## 2017-02-17 MED ORDER — CYCLOBENZAPRINE HCL 10 MG PO TABS: 10.0000 mg | ORAL_TABLET | Freq: Every day | ORAL | 0 refills | Status: DC

## 2017-02-17 MED ORDER — OXYCODONE HCL 10 MG PO TABS: 10.0000 mg | ORAL_TABLET | Freq: Four times a day (QID) | ORAL | 0 refills | Status: DC | PRN

## 2017-02-17 NOTE — Progress Notes (Signed)
Patient's Name: [name]  MRN: [id number]  Referring Provider: Arnetha Courser, MD  DOB: Oct 01, 1970  PCP: Arnetha Courser, MD  DOS: 02/17/2017  Note by: Vevelyn Francois NP  Service setting: Ambulatory outpatient  Specialty: Interventional Pain Management  Location: [name] (AMB) Pain Management Facility    Patient type: Established    Primary Reason(s) for Visit: Encounter for prescription drug management. (Level of risk: moderate)  CC: Back Pain (lower)  HPI  [name] is a 46 y.o. year old, female patient, who comes today for a medication management evaluation. She has Adjustment disorder with depressed mood; Stressful life events affecting family and household; Preventative health care; Vitamin D deficiency; Lumbosacral spondylosis without myelopathy; Lumbar central spinal stenosis (L4-5); Breast cancer screening; Bladder irritation; Chronic low back pain (Location of Primary Source of Pain) (midline); Arthralgia of multiple joints; Chronic, continuous use of opioids; Elevated rheumatoid factor; Chronic pain syndrome; Long term (current) use of opiate analgesic; Long term prescription opiate use; Opiate use (85.5 MME/Day); Chronic hip pain (Location of Secondary source of pain) (Left); Failed back surgical syndrome; Discogenic low back pain; Lumbar discogenic pain syndrome; Chronic sacroiliac joint pain (Left); Musculoskeletal pain; Lumbar facet syndrome (Bilateral); Lumbar spondylosis; Neurogenic pain; and Depression on her problem list. Her primarily concern today is the Back Pain (lower)  Pain Assessment: Location: Lower Back Radiating: stays in the lower back Onset: More than a month ago Duration: Chronic pain Quality: Aching, Constant Severity: 3 /10 (self-reported pain score)  Note: Reported level is compatible with observation.                   Effect on ADL: pace self Timing: Constant Modifying factors: medicine, hot baths, ice pack  [name] was last scheduled for an appointment on  01/14/17 for medication management. During today's appointment we reviewed [name]'s chronic pain status, as well as her outpatient medication regimen. She did have PT after her surgery. She admits that the PT was not effective for diagnosis.  She has constant pain. She states that the lumbar facet lasted about 24 hours secondary to no steroids. She would like to proceed with the RFA to get long term benefit. She admits that she is using her medication as directed. She admits that the Gabapentin is effective for her pain. She admits that she is resting better with this. She admits that her Oxycodone is very effective for her pain.   The patient  reports that she does not use drugs. Her body mass index is 29.05 kg/m.  Further details on both, my assessment(s), as well as the proposed treatment plan, please see below.  Controlled Substance Pharmacotherapy Assessment REMS (Risk Evaluation and Mitigation Strategy)  Analgesic: Oxycodone IR 10 mg every 6 hours (40 mg/day of oxycodone) (60 MME/day) MME/day: 60 mg/day.  Lona Millard, RN  02/17/2017  3:26 PM  Sign at close encounter Nursing Pain Medication Assessment:  Safety precautions to be maintained throughout the outpatient stay will include: orient to surroundings, keep bed in low position, maintain call bell within reach at all times, provide assistance with transfer out of bed and ambulation.  Medication Inspection Compliance: Pill count conducted under aseptic conditions, in front of the patient. Neither the pills nor the bottle was removed from the patient's sight at any time. Once count was completed pills were immediately returned to the patient in their original bottle.  Medication: Oxycodone IR Pill/Patch Count: 0 of 120 pills remain Pill/Patch Appearance:  Bottle Appearance:  Standard pharmacy container. Clearly labeled. Filled Date: 08 / 09 / 2018 Last Medication intake:  Yesterday   Pharmacokinetics: Liberation and absorption (onset  of action): WNL Distribution (time to peak effect): WNL Metabolism and excretion (duration of action): WNL         Pharmacodynamics: Desired effects: Analgesia: [name] reports >50% benefit. Functional ability: Patient reports that medication allows her to accomplish basic ADLs Clinically meaningful improvement in function (CMIF): Sustained CMIF goals met Perceived effectiveness: Described as relatively effective, allowing for increase in activities of daily living (ADL) Undesirable effects: Side-effects or Adverse reactions: None reported Monitoring: [place] PMP: Online review of the past 50-monthperiod conducted. Compliant with practice rules and regulations List of all UDS test(s) done:  Lab Results  Component Value Date   SUMMARY FINAL 12/05/2016   Last UDS on record: Summary  Date Value Ref Range Status  12/05/2016 FINAL  Final    Comment:    ==================================================================== [facility]RE COMP DRUG ANALYSIS,UR ==================================================================== Test                             Result       Flag       Units Drug Present and Declared for Prescription Verification   Oxycodone                      386          EXPECTED   ng/mg creat   Oxymorphone                    4143         EXPECTED   ng/mg creat   Noroxycodone                   4130         EXPECTED   ng/mg creat   Noroxymorphone                 646          EXPECTED   ng/mg creat    Sources of oxycodone are scheduled prescription medications.    Oxymorphone, noroxycodone, and noroxymorphone are expected    metabolites of oxycodone. Oxymorphone is also available as a    scheduled prescription medication.   Acetaminophen                  PRESENT      EXPECTED Drug Absent but Declared for Prescription Verification   Duloxetine                     Not Detected UNEXPECTED ==================================================================== Test                       Result    Flag   Units      Ref Range   Creatinine              207              mg/dL      >=20 ==================================================================== Declared Medications:  The flagging and interpretation on this report are based on the  following declared medications.  Unexpected results may arise from  inaccuracies in the declared medications.  **Note: The testing scope of this panel includes these medications:  Duloxetine  Oxycodone  Oxycodone (Oxycodone Acetaminophen)  **Note: The testing scope of this panel does not include small to  moderate amounts of these reported medications:  Acetaminophen (Oxycodone Acetaminophen)  **Note: The testing scope of this panel does not include following  reported medications:  Meloxicam ==================================================================== For clinical consultation, please call [phone]. ====================================================================    UDS interpretation: Compliant          Medication Assessment Form: Reviewed. Patient indicates being compliant with therapy Treatment compliance: Compliant Risk Assessment Profile: Aberrant behavior: See prior evaluations. None observed or detected today Comorbid factors increasing risk of overdose: See prior notes. No additional risks detected today Risk of substance use disorder (SUD): Low     Opioid Risk Tool - 02/17/17 1119      Family History of Substance Abuse   Alcohol Negative   Illegal Drugs Negative   Rx Drugs Negative     Personal History of Substance Abuse   Alcohol Negative   Illegal Drugs Negative   Rx Drugs Negative     Age   Age between 16-45 years  No     History of Preadolescent Sexual Abuse   History of Preadolescent Sexual Abuse Negative or Female     Psychological Disease   Psychological Disease Negative   ADD Negative   Schizophrenia Negative   Depression Positive     Total Score   Opioid Risk Tool Scoring 1    Opioid Risk Interpretation Low Risk     ORT Scoring interpretation table:  Score <3 = Low Risk for SUD  Score between 4-7 = Moderate Risk for SUD  Score >8 = High Risk for Opioid Abuse   Risk Mitigation Strategies:  Patient Counseling: Covered Patient-Prescriber Agreement (PPA): Present and active  Notification to other healthcare providers: Done  Pharmacologic Plan: No change in therapy, at this time  Laboratory Chemistry  Inflammation Markers (CRP: Acute Phase) (ESR: Chronic Phase) Lab Results  Component Value Date   CRP 5.3 (H) 12/05/2016   ESRSEDRATE 9 12/05/2016                 Renal Function Markers Lab Results  Component Value Date   BUN 17 12/05/2016   CREATININE 0.84 12/05/2016   GFRAA 96 12/05/2016   GFRNONAA 83 12/05/2016                 Hepatic Function Markers Lab Results  Component Value Date   AST 21 12/05/2016   ALT 20 12/05/2016   ALBUMIN 4.4 12/05/2016   ALKPHOS 59 12/05/2016                 Electrolytes Lab Results  Component Value Date   NA 139 12/05/2016   K 4.6 12/05/2016   CL 103 12/05/2016   CALCIUM 9.5 12/05/2016   MG 2.1 12/05/2016                 Neuropathy Markers Lab Results  Component Value Date   VITAMINB12 495 12/05/2016                 Bone Pathology Markers Lab Results  Component Value Date   ALKPHOS 59 12/05/2016   VD25OH 27.1 (L) 09/18/2016   25OHVITD1 49 12/05/2016   25OHVITD2 <1.0 12/05/2016   25OHVITD3 49 12/05/2016   CALCIUM 9.5 12/05/2016                 Coagulation Parameters Lab Results  Component Value Date   INR 0.9 04/16/2013   LABPROT 12.4 04/16/2013   PLT 287 10/02/2016  Cardiovascular Markers Lab Results  Component Value Date   HGB 13.3 10/02/2016   HCT 40.8 10/02/2016                 Note: Lab results reviewed.  Recent Diagnostic Imaging Review  Dg C-arm 1-60 Min-no Report  Result Date: 01/30/2017 Fluoroscopy was utilized by the requesting physician.  No  radiographic interpretation.   Note: Imaging results reviewed.          Meds   Current Outpatient Prescriptions:  .  cyclobenzaprine (FLEXERIL) 10 MG tablet, Take 1 tablet (10 mg total) by mouth at bedtime., Disp: 30 tablet, Rfl: 0 .  DULoxetine (CYMBALTA) 30 MG capsule, Take 1 capsule (30 mg total) by mouth 2 (two) times daily., Disp: 60 capsule, Rfl: 0 .  gabapentin (NEURONTIN) 100 MG capsule, Take 1-3 capsules (100-300 mg total) by mouth at bedtime., Disp: 90 capsule, Rfl: 0 .  meloxicam (MOBIC) 15 MG tablet, Take 1 tablet (15 mg total) by mouth daily., Disp: 30 tablet, Rfl: 0 .  Oxycodone HCl 10 MG TABS, Take 1 tablet (10 mg total) by mouth every 6 (six) hours as needed., Disp: 120 tablet, Rfl: 0  ROS  Constitutional: Denies any fever or chills Gastrointestinal: No reported hemesis, hematochezia, vomiting, or acute GI distress Musculoskeletal: Denies any acute onset joint swelling, redness, loss of ROM, or weakness Neurological: No reported episodes of acute onset apraxia, aphasia, dysarthria, agnosia, amnesia, paralysis, loss of coordination, or loss of consciousness  Allergies  Ms. Roemer is allergic to codeine.  PFSH  Drug: Ms. Murcia  reports that she does not use drugs. Alcohol:  reports that she does not drink alcohol. Tobacco:  reports that she has been smoking Cigarettes.  She has a 20.00 pack-year smoking history. She has never used smokeless tobacco. Medical:  has a past medical history of Allergy; Anxiety; Chronic bilateral low back pain (07/22/2016); and Depression. Surgical: Ms. Difatta  has a past surgical history that includes Abdominal hysterectomy; Tonsillectomy; Cesarean section; Spine surgery; Spine surgery (09/09/2015); and Back surgery. Family: family history includes Arthritis in her father; Cancer in her maternal aunt; Depression in her sister and sister; Diabetes in her mother; Heart disease in her father and maternal grandfather; Hyperlipidemia in her father;  Hypertension in her father and mother; Lung disease in her father.  Constitutional Exam  General appearance: Well nourished, well developed, and well hydrated. In no apparent acute distress Vitals:   02/17/17 1113  BP: 130/87  Pulse: 72  Resp: 16  Temp: (!) 97.2 F (36.2 C)  SpO2: 100%  Weight: 180 lb (81.6 kg)  Height: _0  (1.676 m)  Psych/Mental status: Alert, oriented x 3 (person, place, & time)       Eyes: PERLA Respiratory: No evidence of acute respiratory distress  Cervical Spine Area Exam  Skin & Axial Inspection: No masses, redness, edema, swelling, or associated skin lesions Alignment: Symmetrical Functional ROM: Unrestricted ROM      Stability: No instability detected Muscle Tone/Strength: Functionally intact. No obvious neuro-muscular anomalies detected. Sensory (Neurological): Unimpaired Palpation: No palpable anomalies              Upper Extremity (UE) Exam    Side: Right upper extremity  Side: Left upper extremity  Skin & Extremity Inspection: Skin color, temperature, and hair growth are WNL. No peripheral edema or cyanosis. No masses, redness, swelling, asymmetry, or associated skin lesions. No contractures.  Skin & Extremity Inspection: Skin color, temperature, and hair growth are WNL. No  peripheral edema or cyanosis. No masses, redness, swelling, asymmetry, or associated skin lesions. No contractures.  Functional ROM: Unrestricted ROM          Functional ROM: Unrestricted ROM          Muscle Tone/Strength: Functionally intact. No obvious neuro-muscular anomalies detected.  Muscle Tone/Strength: Functionally intact. No obvious neuro-muscular anomalies detected.  Sensory (Neurological): Unimpaired          Sensory (Neurological): Unimpaired          Palpation: No palpable anomalies              Palpation: No palpable anomalies              Specialized Test(s): Deferred         Specialized Test(s): Deferred          Thoracic Spine Area Exam  Skin & Axial  Inspection: No masses, redness, or swelling Alignment: Symmetrical Functional ROM: Unrestricted ROM Stability: No instability detected Muscle Tone/Strength: Functionally intact. No obvious neuro-muscular anomalies detected. Sensory (Neurological): Unimpaired Muscle strength & Tone: No palpable anomalies  Lumbar Spine Area Exam  Skin & Axial Inspection: Well healed scar from previous spine surgery detected Alignment: Symmetrical Functional ROM: Unrestricted ROM      Stability: No instability detected Muscle Tone/Strength: Functionally intact. No obvious neuro-muscular anomalies detected. Sensory (Neurological): Unimpaired Palpation: Non-tender       Provocative Tests: Lumbar Hyperextension and rotation test: Unable to perform due to fusion restriction. Lumbar Lateral bending test: evaluation deferred today       Patrick's Maneuver: evaluation deferred today                    Gait & Posture Assessment  Ambulation: Unassisted Gait: Relatively normal for age and body habitus Posture: WNL   Lower Extremity Exam    Side: Right lower extremity  Side: Left lower extremity  Skin & Extremity Inspection: Skin color, temperature, and hair growth are WNL. No peripheral edema or cyanosis. No masses, redness, swelling, asymmetry, or associated skin lesions. No contractures.  Skin & Extremity Inspection: Skin color, temperature, and hair growth are WNL. No peripheral edema or cyanosis. No masses, redness, swelling, asymmetry, or associated skin lesions. No contractures.  Functional ROM: Unrestricted ROM          Functional ROM: Unrestricted ROM          Muscle Tone/Strength: Able to Toe-walk & Heel-walk without problems  Muscle Tone/Strength: Able to Toe-walk & Heel-walk without problems  Sensory (Neurological): Unimpaired  Sensory (Neurological): Unimpaired  Palpation: No palpable anomalies  Palpation: No palpable anomalies   Assessment  Primary Diagnosis & Pertinent Problem List: The primary  encounter diagnosis was Chronic low back pain (Location of Primary Source of Pain) (midline). Diagnoses of Musculoskeletal pain, Lumbar central spinal stenosis (L4-5), Lumbar spondylosis, Lumbar facet syndrome (Bilateral), Neurogenic pain, Chronic pain syndrome, and Failed back surgical syndrome were also pertinent to this visit.  Status Diagnosis  Controlled Controlled Controlled 1. Chronic low back pain (Location of Primary Source of Pain) (midline)   2. Musculoskeletal pain   3. Lumbar central spinal stenosis (L4-5)   4. Lumbar spondylosis   5. Lumbar facet syndrome (Bilateral)   6. Neurogenic pain   7. Chronic pain syndrome   8. Failed back surgical syndrome     Problems updated and reviewed during this visit: No problems updated. Plan of Care  Pharmacotherapy (Medications Ordered): Meds ordered this encounter  Medications  . meloxicam (MOBIC) 15 MG  tablet    Sig: Take 1 tablet (15 mg total) by mouth daily.    Dispense:  30 tablet    Refill:  0    Do not place medication on "Automatic Refill". Fill one day early if pharmacy is closed on scheduled refill date.    Order Specific Question:   Supervising Provider    Answer:   Milinda Pointer [phone]  . DULoxetine (CYMBALTA) 30 MG capsule    Sig: Take 1 capsule (30 mg total) by mouth 2 (two) times daily.    Dispense:  60 capsule    Refill:  0    Do not place medication on "Automatic Refill". Fill one day early if pharmacy is closed on scheduled refill date.    Order Specific Question:   Supervising Provider    Answer:   Milinda Pointer [phone]  . Oxycodone HCl 10 MG TABS    Sig: Take 1 tablet (10 mg total) by mouth every 6 (six) hours as needed.    Dispense:  120 tablet    Refill:  0    Do not place this medication, or any other prescription from our practice, on "Automatic Refill". Patient may have prescription filled one day early if pharmacy is closed on scheduled refill date. Do not fill until:02/17/2017 To last  until: 03/19/2017    Order Specific Question:   Supervising Provider    Answer:   Milinda Pointer [phone]  . cyclobenzaprine (FLEXERIL) 10 MG tablet    Sig: Take 1 tablet (10 mg total) by mouth at bedtime.    Dispense:  30 tablet    Refill:  0    Do not place this medication, or any other prescription from our practice, on "Automatic Refill". Patient may have prescription filled one day early if pharmacy is closed on scheduled refill date.    Order Specific Question:   Supervising Provider    Answer:   Milinda Pointer [phone]  . gabapentin (NEURONTIN) 100 MG capsule    Sig: Take 1-3 capsules (100-300 mg total) by mouth at bedtime.    Dispense:  90 capsule    Refill:  0    Do not place this medication, or any other prescription from our practice, on "Automatic Refill". Patient may have prescription filled one day early if pharmacy is closed on scheduled refill date.    Order Specific Question:   Supervising Provider    Answer:   Milinda Pointer [[id number]]   New Prescriptions   No medications on file   Medications administered today: Ms. Quesinberry had no medications administered during this visit. Lab-work, procedure(s), and/or referral(s): Orders Placed This Encounter  Procedures  . Ambulatory referral to Physical Therapy   Imaging and/or referral(s): AMB REFERRAL TO PHYSICAL THERAPY  Interventional management options: Planned, scheduled, and/or pending:   Diagnostic bilateral lumbar facet block(NO STEROIDS) #2   Considering:   Diagnostic bilateral lumbar facet block Possible bilateral lumbar facet RFA Diagnostic left sided caudal epidural steroid injection + diagnostic epidurogram Possible left-sided Racz procedure Diagnostic left L4-5 interlaminar lumbar epidural steroid injection Diagnosticleft L4-5 transforaminal epidural steroid injection Diagnostic left L4 selective nerve root block Possible left L4 nerve root ganglion RFA Possible lumbar spinal cord  stimulator trial   Palliative PRN treatment(s):   Diagnostic bilateral lumbar facet block(NO STEROIDS) #2   Provider-requested follow-up: Return in about 4 weeks (around 03/17/2017) for MedMgmt.  Future Appointments Date Time Provider [place]  03/03/2017 1:45 PM Milinda Pointer, MD [name] None  03/17/2017 9:15 AM Edison Pace,  Diona Foley, NP [facility] None   Primary Care Physician: Arnetha Courser, MD Location: [facility] Out[facility] Pain Management Facility Note by: Vevelyn Francois NP Date: 02/17/2017; Time: 3:49 PM  Pain Score Disclaimer: We use the NRS-11 scale. This is a self-reported, subjective measurement of pain severity with only modest accuracy. It is used primarily to identify changes within a particular patient. It must be understood that outpatient pain scales are significantly less accurate that those used for research, where they can be applied under ideal controlled circumstances with minimal exposure to variables. In reality, the score is likely to be a combination of pain intensity and pain affect, where pain affect describes the degree of emotional arousal or changes in action readiness caused by the sensory experience of pain. Factors such as social and work situation, setting, emotional state, anxiety levels, expectation, and prior pain experience may influence pain perception and show large inter-individual differences that may also be affected by time variables.  Patient instructions provided during this appointment: Patient Instructions    ____________________________________________________________________________________________  Medication Rules  Applies to: All patients receiving prescriptions (written or electronic).  Pharmacy of record: Pharmacy where electronic prescriptions will be sent. If written prescriptions are taken to a different pharmacy, please inform the nursing staff. The pharmacy listed in the electronic medical record should be the one where  you would like electronic prescriptions to be sent.  Prescription refills: Only during scheduled appointments. Applies to both, written and electronic prescriptions.  NOTE: The following applies primarily to controlled substances (Opioid* Pain Medications).   Patient's responsibilities: 1. Pain Pills: Bring all pain pills to every appointment (except for procedure appointments). 2. Pill Bottles: Bring pills in original pharmacy bottle. Always bring newest bottle. Bring bottle, even if empty. 3. Medication refills: You are responsible for knowing and keeping track of what medications you need refilled. The day before your appointment, write a list of all prescriptions that need to be refilled. Bring that list to your appointment and give it to the admitting nurse. Prescriptions will be written only during appointments. If you forget a medication, it will not be "Called in", "Faxed", or "electronically sent". You will need to get another appointment to get these prescribed. 4. Prescription Accuracy: You are responsible for carefully inspecting your prescriptions before leaving our office. Have the discharge nurse carefully go over each prescription with you, before taking them home. Make sure that your name is accurately spelled, that your address is correct. Check the name and dose of your medication to make sure it is accurate. Check the number of pills, and the written instructions to make sure they are clear and accurate. Make sure that you are given enough medication to last until your next medication refill appointment. 5. Taking Medication: Take medication as prescribed. Never take more pills than instructed. Never take medication more frequently than prescribed. Taking less pills or less frequently is permitted and encouraged, when it comes to controlled substances (written prescriptions).  6. Inform other Doctors: Always inform, all of your healthcare providers, of all the medications you  take. 7. Pain Medication from other Providers: You are not allowed to accept any additional pain medication from any other Doctor or Healthcare provider. There are two exceptions to this rule. (see below) In the event that you require additional pain medication, you are responsible for notifying us, as stated below. 8. Medication Agreement: You are responsible for carefully reading and following our Medication Agreement. This must be signed before receiving any prescriptions from  our practice. Safely store a copy of your signed Agreement. Violations to the Agreement will result in no further prescriptions. (Additional copies of our Medication Agreement are available upon request.) 9. Laws, Rules, & Regulations: All patients are expected to follow all Federal and [employer], [employer], Rules, [employer]. Ignorance of the Laws does not constitute a valid excuse. The use of any illegal substances is prohibited. 10. Adopted CDC guidelines & recommendations: Target dosing levels will be at or below 60 MME/day. Use of benzodiazepines** is not recommended.  Exceptions: There are only two exceptions to the rule of not receiving pain medications from other Healthcare Providers. 1. Exception #1 (Emergencies): In the event of an emergency (i.e.: accident requiring emergency care), you are allowed to receive additional pain medication. However, you are responsible for: As soon as you are able, call our office [phone], at any time of the day or night, and leave a message stating your name, the date and nature of the emergency, and the name and dose of the medication prescribed. In the event that your call is answered by a member of our staff, make sure to document and save the date, time, and the name of the person that took your information.  2. Exception #2 (Planned Surgery): In the event that you are scheduled by another doctor or dentist to have any type of surgery or procedure, you are allowed (for a period  no longer than 30 days), to receive additional pain medication, for the acute post-op pain. However, in this case, you are responsible for picking up a copy of our "Post-op Pain Management for Surgeons" handout, and giving it to your surgeon or dentist. This document is available at our office, and does not require an appointment to obtain it. Simply go to our office during business hours (Monday-Thursday from 8:00 AM to 4:00 PM) (Friday 8:00 AM to 12:00 Noon) or if you have a scheduled appointment with [place], prior to your surgery, and ask for it by name. In addition, you will need to provide [place] with your name, name of your surgeon, type of surgery, and date of procedure or surgery.  *Opioid medications include: morphine, codeine, oxycodone, oxymorphone, hydrocodone, hydromorphone, meperidine, tramadol, tapentadol, buprenorphine, fentanyl, methadone. **Benzodiazepine medications include: diazepam (Valium), alprazolam (Xanax), clonazepam (Klonopine), lorazepam (Ativan), clorazepate (Tranxene), chlordiazepoxide (Librium), estazolam (Prosom), oxazepam (Serax), temazepam (Restoril), triazolam (Halcion)  ____________________________________________________________________________________________  BMI Assessment: Estimated body mass index is 29.05 kg/m as calculated from the following:   Height as of this encounter: _0  (1.676 m).   Weight as of this encounter: 180 lb (81.6 kg).  BMI interpretation table: BMI level Category Range association with higher incidence of chronic pain  <18 kg/m2 Underweight   18.5-24.9 kg/m2 Ideal body weight   25-29.9 kg/m2 Overweight Increased incidence by 20%  30-34.9 kg/m2 Obese (Class I) Increased incidence by 68%  35-39.9 kg/m2 Severe obesity (Class II) Increased incidence by 136%  >40 kg/m2 Extreme obesity (Class III) Increased incidence by 254%   BMI Readings from Last 4 Encounters:  02/17/17 29.05 kg/m  01/30/17 29.05 kg/m  01/14/17 29.86 kg/m  12/25/16  29.86 kg/m   Wt Readings from Last 4 Encounters:  02/17/17 180 lb (81.6 kg)  01/30/17 180 lb (81.6 kg)  01/14/17 185 lb (83.9 kg)  12/25/16 185 lb (83.9 kg)  Pain Management Discharge Instructions  General Discharge Instructions :  If you need to reach your doctor call: Monday-Friday 8:00 am - 4:00 pm at [phone] or toll free  [phone].  After clinic hours [phone] to have operator reach doctor.  Bring all of your medication bottles to all your appointments in the pain clinic.  To cancel or reschedule your appointment with Pain Management please remember to call 24 hours in advance to avoid a fee.  Refer to the educational materials which you have been given on: General Risks, I had my Procedure. Discharge Instructions, Post Sedation.  Post Procedure Instructions:  The drugs you were given will stay in your system until tomorrow, so for the next 24 hours you should not drive, make any legal decisions or drink any alcoholic beverages.  You may eat anything you prefer, but it is better to start with liquids then soups and crackers, and gradually work up to solid foods.  Please notify your doctor immediately if you have any unusual bleeding, trouble breathing or pain that is not related to your normal pain.  Depending on the type of procedure that was done, some parts of your body may feel week and/or numb.  This usually clears up by tonight or the next day.  Walk with the use of an assistive device or accompanied by an adult for the 24 hours.  You may use ice on the affected area for the first 24 hours.  Put ice in a Ziploc bag and cover with a towel and place against area 15 minutes on 15 minutes off.  You may switch to heat after 24 hours.Radiofrequency Lesioning Radiofrequency lesioning is a procedure that is performed to relieve pain. The procedure is often used for back, neck, or arm pain. Radiofrequency lesioning involves the use of a machine that creates radio waves  to make heat. During the procedure, the heat is applied to the nerve that carries the pain signal. The heat damages the nerve and interferes with the pain signal. Pain relief usually starts about 2 weeks after the procedure and lasts for 6 months to 1 year. Tell a health care provider about:  Any allergies you have.  All medicines you are taking, including vitamins, herbs, eye drops, creams, and over-the-counter medicines.  Any problems you or family members have had with anesthetic medicines.  Any blood disorders you have.  Any surgeries you have had.  Any medical conditions you have.  Whether you are pregnant or may be pregnant. What are the risks? Generally, this is a safe procedure. However, problems may occur, including:  Pain or soreness at the injection site.  Infection at the injection site.  Damage to nerves or blood vessels.  What happens before the procedure?  Ask your health care provider about: ? Changing or stopping your regular medicines. This is especially important if you are taking diabetes medicines or blood thinners. ? Taking medicines such as aspirin and ibuprofen. These medicines can thin your blood. Do not take these medicines before your procedure if your health care provider instructs you not to.  Follow instructions from your health care provider about eating or drinking restrictions.  Plan to have someone take you home after the procedure.  If you go home right after the procedure, plan to have someone with you for 24 hours. What happens during the procedure?  You will be given one or more of the following: ? A medicine to help you relax (sedative). ? A medicine to numb the area (local anesthetic).  You will be awake during the procedure. You will need to be able to talk with the health care provider during the procedure.  With the  help of a type of X-Truman (fluoroscopy), the health care provider will insert a radiofrequency needle into the area to  be treated.  Next, a wire that carries the radio waves (electrode) will be put through the radiofrequency needle. An electrical pulse will be sent through the electrode to verify the correct nerve. You will feel a tingling sensation, and you may have muscle twitching.  Then, the tissue that is around the needle tip will be heated by an electric current that is passed using the radiofrequency machine. This will numb the nerves.  A bandage (dressing) will be put on the insertion area after the procedure is done. The procedure may vary among health care providers and hospitals. What happens after the procedure?  Your blood pressure, heart rate, breathing rate, and blood oxygen level will be monitored often until the medicines you were given have worn off.  Return to your normal activities as directed by your health care provider. This information is not intended to replace advice given to you by your health care provider. Make sure you discuss any questions you have with your health care provider. Document Released: 01/23/2011 Document Revised: 11/02/2015 Document Reviewed: 07/04/2014 Elsevier Interactive Patient Education  2018 [place]. Pain Management Discharge Instructions  General Discharge Instructions :  If you need to reach your doctor call: Monday-Friday 8:00 am - 4:00 pm at [phone] or toll free [phone].  After clinic hours [phone] to have operator reach doctor.  Bring all of your medication bottles to all your appointments in the pain clinic.  To cancel or reschedule your appointment with Pain Management please remember to call 24 hours in advance to avoid a fee.  Refer to the educational materials which you have been given on: General Risks, I had my Procedure. Discharge Instructions, Post Sedation.  Post Procedure Instructions:  The drugs you were given will stay in your system until tomorrow, so for the next 24 hours you should not drive, make any legal  decisions or drink any alcoholic beverages.  You may eat anything you prefer, but it is better to start with liquids then soups and crackers, and gradually work up to solid foods.  Please notify your doctor immediately if you have any unusual bleeding, trouble breathing or pain that is not related to your normal pain.  Depending on the type of procedure that was done, some parts of your body may feel week and/or numb.  This usually clears up by tonight or the next day.  Walk with the use of an assistive device or accompanied by an adult for the 24 hours.  You may use ice on the affected area for the first 24 hours.  Put ice in a Ziploc bag and cover with a towel and place against area 15 minutes on 15 minutes off.  You may switch to heat after 24 hours.Radiofrequency Lesioning, Care After Refer to this sheet in the next few weeks. These instructions provide you with information about caring for yourself after your procedure. Your health care provider may also give you more specific instructions. Your treatment has been planned according to current medical practices, but problems sometimes occur. Call your health care provider if you have any problems or questions after your procedure. What can I expect after the procedure? After the procedure, it is common to have:  Pain from the burned nerve.  Temporary numbness.  Follow these instructions at home:  Take over-the-counter and prescription medicines only as told by your health care provider.  Return to your normal activities as told by your health care provider. Ask your health care provider what activities are safe for you.  Pay close attention to how you feel after the procedure. If you start to have pain, write down when it hurts and how it feels. This will help you and your health care provider to know if you need an additional treatment.  Check your needle insertion site every day for signs of infection. Watch for: ? Redness,  swelling, or pain. ? Fluid, blood, or pus.  Keep all follow-up visits as told by your health care provider. This is important. Contact a health care provider if:  Your pain does not get better.  You have redness, swelling, or pain at the needle insertion site.  You have fluid, blood, or pus coming from the needle insertion site.  You have a fever. Get help right away if:  You develop sudden, severe pain.  You develop numbness or tingling near the procedure site that does not go away. This information is not intended to replace advice given to you by your health care provider. Make sure you discuss any questions you have with your health care provider. Document Released: 01/24/2011 Document Revised: 11/02/2015 Document Reviewed: 07/04/2014 Elsevier Interactive Patient Education  [employer].

## 2017-02-17 NOTE — Patient Instructions (Addendum)
____________________________________________________________________________________________  Medication Rules  Applies to: All patients receiving prescriptions (written or electronic).  Pharmacy of record: Pharmacy where electronic prescriptions will be sent. If written prescriptions are taken to a different pharmacy, please inform the nursing staff. The pharmacy listed in the electronic medical record should be the one where you would like electronic prescriptions to be sent.  Prescription refills: Only during scheduled appointments. Applies to both, written and electronic prescriptions.  NOTE: The following applies primarily to controlled substances (Opioid* Pain Medications).   Patient's responsibilities: 1. Pain Pills: Bring all pain pills to every appointment (except for procedure appointments). 2. Pill Bottles: Bring pills in original pharmacy bottle. Always bring newest bottle. Bring bottle, even if empty. 3. Medication refills: You are responsible for knowing and keeping track of what medications you need refilled. The day before your appointment, write a list of all prescriptions that need to be refilled. Bring that list to your appointment and give it to the admitting nurse. Prescriptions will be written only during appointments. If you forget a medication, it will not be "Called in", "Faxed", or "electronically sent". You will need to get another appointment to get these prescribed. 4. Prescription Accuracy: You are responsible for carefully inspecting your prescriptions before leaving our office. Have the discharge nurse carefully go over each prescription with you, before taking them home. Make sure that your name is accurately spelled, that your address is correct. Check the name and dose of your medication to make sure it is accurate. Check the number of pills, and the written instructions to make sure they are clear and accurate. Make sure that you are given enough medication to  last until your next medication refill appointment. 5. Taking Medication: Take medication as prescribed. Never take more pills than instructed. Never take medication more frequently than prescribed. Taking less pills or less frequently is permitted and encouraged, when it comes to controlled substances (written prescriptions).  6. Inform other Doctors: Always inform, all of your healthcare providers, of all the medications you take. 7. Pain Medication from other Providers: You are not allowed to accept any additional pain medication from any other Doctor or Healthcare provider. There are two exceptions to this rule. (see below) In the event that you require additional pain medication, you are responsible for notifying us, as stated below. 8. Medication Agreement: You are responsible for carefully reading and following our Medication Agreement. This must be signed before receiving any prescriptions from our practice. Safely store a copy of your signed Agreement. Violations to the Agreement will result in no further prescriptions. (Additional copies of our Medication Agreement are available upon request.) 9. Laws, Rules, & Regulations: All patients are expected to follow all Federal and State Laws, Statutes, Rules, & Regulations. Ignorance of the Laws does not constitute a valid excuse. The use of any illegal substances is prohibited. 10. Adopted CDC guidelines & recommendations: Target dosing levels will be at or below 60 MME/day. Use of benzodiazepines** is not recommended.  Exceptions: There are only two exceptions to the rule of not receiving pain medications from other Healthcare Providers. 1. Exception #1 (Emergencies): In the event of an emergency (i.e.: accident requiring emergency care), you are allowed to receive additional pain medication. However, you are responsible for: As soon as you are able, call our office [phone], at any time of the day or night, and leave a message stating your  name, the date and nature of the emergency, and the name and dose of the medication   prescribed. In the event that your call is answered by a member of our staff, make sure to document and save the date, time, and the name of the person that took your information.  2. Exception #2 (Planned Surgery): In the event that you are scheduled by another doctor or dentist to have any type of surgery or procedure, you are allowed (for a period no longer than 30 days), to receive additional pain medication, for the acute post-op pain. However, in this case, you are responsible for picking up a copy of our "Post-op Pain Management for Surgeons" handout, and giving it to your surgeon or dentist. This document is available at our office, and does not require an appointment to obtain it. Simply go to our office during business hours (Monday-Thursday from 8:00 AM to 4:00 PM) (Friday 8:00 AM to 12:00 Noon) or if you have a scheduled appointment with [place], prior to your surgery, and ask for it by name. In addition, you will need to provide [place] with your name, name of your surgeon, type of surgery, and date of procedure or surgery.  *Opioid medications include: morphine, codeine, oxycodone, oxymorphone, hydrocodone, hydromorphone, meperidine, tramadol, tapentadol, buprenorphine, fentanyl, methadone. **Benzodiazepine medications include: diazepam (Valium), alprazolam (Xanax), clonazepam (Klonopine), lorazepam (Ativan), clorazepate (Tranxene), chlordiazepoxide (Librium), estazolam (Prosom), oxazepam (Serax), temazepam (Restoril), triazolam (Halcion)  ____________________________________________________________________________________________  BMI Assessment: Estimated body mass index is 29.05 kg/m as calculated from the following:   Height as of this encounter: 5\' 6"  (1.676 m).   Weight as of this encounter: 180 lb (81.6 kg).  BMI interpretation table: BMI level Category Range association with higher incidence of chronic pain   <18 kg/m2 Underweight   18.5-24.9 kg/m2 Ideal body weight   25-29.9 kg/m2 Overweight Increased incidence by 20%  30-34.9 kg/m2 Obese (Class I) Increased incidence by 68%  35-39.9 kg/m2 Severe obesity (Class II) Increased incidence by 136%  >40 kg/m2 Extreme obesity (Class III) Increased incidence by 254%   BMI Readings from Last 4 Encounters:  02/17/17 29.05 kg/m  01/30/17 29.05 kg/m  01/14/17 29.86 kg/m  12/25/16 29.86 kg/m   Wt Readings from Last 4 Encounters:  02/17/17 180 lb (81.6 kg)  01/30/17 180 lb (81.6 kg)  01/14/17 185 lb (83.9 kg)  12/25/16 185 lb (83.9 kg)  Pain Management Discharge Instructions  General Discharge Instructions :  If you need to reach your doctor call: Monday-Friday 8:00 am - 4:00 pm at [phone] or toll free [phone].  After clinic hours [phone] to have operator reach doctor.  Bring all of your medication bottles to all your appointments in the pain clinic.  To cancel or reschedule your appointment with Pain Management please remember to call 24 hours in advance to avoid a fee.  Refer to the educational materials which you have been given on: General Risks, I had my Procedure. Discharge Instructions, Post Sedation.  Post Procedure Instructions:  The drugs you were given will stay in your system until tomorrow, so for the next 24 hours you should not drive, make any legal decisions or drink any alcoholic beverages.  You may eat anything you prefer, but it is better to start with liquids then soups and crackers, and gradually work up to solid foods.  Please notify your doctor immediately if you have any unusual bleeding, trouble breathing or pain that is not related to your normal pain.  Depending on the type of procedure that was done, some parts of your body may feel week and/or numb.  This usually  clears up by tonight or the next day.  Walk with the use of an assistive device or accompanied by an adult for the 24 hours.  You  may use ice on the affected area for the first 24 hours.  Put ice in a Ziploc bag and cover with a towel and place against area 15 minutes on 15 minutes off.  You may switch to heat after 24 hours.Radiofrequency Lesioning Radiofrequency lesioning is a procedure that is performed to relieve pain. The procedure is often used for back, neck, or arm pain. Radiofrequency lesioning involves the use of a machine that creates radio waves to make heat. During the procedure, the heat is applied to the nerve that carries the pain signal. The heat damages the nerve and interferes with the pain signal. Pain relief usually starts about 2 weeks after the procedure and lasts for 6 months to 1 year. Tell a health care provider about:  Any allergies you have.  All medicines you are taking, including vitamins, herbs, eye drops, creams, and over-the-counter medicines.  Any problems you or family members have had with anesthetic medicines.  Any blood disorders you have.  Any surgeries you have had.  Any medical conditions you have.  Whether you are pregnant or may be pregnant. What are the risks? Generally, this is a safe procedure. However, problems may occur, including:  Pain or soreness at the injection site.  Infection at the injection site.  Damage to nerves or blood vessels.  What happens before the procedure?  Ask your health care provider about: ? Changing or stopping your regular medicines. This is especially important if you are taking diabetes medicines or blood thinners. ? Taking medicines such as aspirin and ibuprofen. These medicines can thin your blood. Do not take these medicines before your procedure if your health care provider instructs you not to.  Follow instructions from your health care provider about eating or drinking restrictions.  Plan to have someone take you home after the procedure.  If you go home right after the procedure, plan to have someone with you for 24  hours. What happens during the procedure?  You will be given one or more of the following: ? A medicine to help you relax (sedative). ? A medicine to numb the area (local anesthetic).  You will be awake during the procedure. You will need to be able to talk with the health care provider during the procedure.  With the help of a type of X-Bouchillon (fluoroscopy), the health care provider will insert a radiofrequency needle into the area to be treated.  Next, a wire that carries the radio waves (electrode) will be put through the radiofrequency needle. An electrical pulse will be sent through the electrode to verify the correct nerve. You will feel a tingling sensation, and you may have muscle twitching.  Then, the tissue that is around the needle tip will be heated by an electric current that is passed using the radiofrequency machine. This will numb the nerves.  A bandage (dressing) will be put on the insertion area after the procedure is done. The procedure may vary among health care providers and hospitals. What happens after the procedure?  Your blood pressure, heart rate, breathing rate, and blood oxygen level will be monitored often until the medicines you were given have worn off.  Return to your normal activities as directed by your health care provider. This information is not intended to replace advice given to you by your health care  provider. Make sure you discuss any questions you have with your health care provider. Document Released: 01/23/2011 Document Revised: 11/02/2015 Document Reviewed: 07/04/2014 Elsevier Interactive Patient Education  2018 [employer]. Pain Management Discharge Instructions  General Discharge Instructions :  If you need to reach your doctor call: Monday-Friday 8:00 am - 4:00 pm at [phone] or toll free [phone].  After clinic hours [phone] to have operator reach doctor.  Bring all of your medication bottles to all your appointments in  the pain clinic.  To cancel or reschedule your appointment with Pain Management please remember to call 24 hours in advance to avoid a fee.  Refer to the educational materials which you have been given on: General Risks, I had my Procedure. Discharge Instructions, Post Sedation.  Post Procedure Instructions:  The drugs you were given will stay in your system until tomorrow, so for the next 24 hours you should not drive, make any legal decisions or drink any alcoholic beverages.  You may eat anything you prefer, but it is better to start with liquids then soups and crackers, and gradually work up to solid foods.  Please notify your doctor immediately if you have any unusual bleeding, trouble breathing or pain that is not related to your normal pain.  Depending on the type of procedure that was done, some parts of your body may feel week and/or numb.  This usually clears up by tonight or the next day.  Walk with the use of an assistive device or accompanied by an adult for the 24 hours.  You may use ice on the affected area for the first 24 hours.  Put ice in a Ziploc bag and cover with a towel and place against area 15 minutes on 15 minutes off.  You may switch to heat after 24 hours.Radiofrequency Lesioning, Care After Refer to this sheet in the next few weeks. These instructions provide you with information about caring for yourself after your procedure. Your health care provider may also give you more specific instructions. Your treatment has been planned according to current medical practices, but problems sometimes occur. Call your health care provider if you have any problems or questions after your procedure. What can I expect after the procedure? After the procedure, it is common to have:  Pain from the burned nerve.  Temporary numbness.  Follow these instructions at home:  Take over-the-counter and prescription medicines only as told by your health care provider.  Return to  your normal activities as told by your health care provider. Ask your health care provider what activities are safe for you.  Pay close attention to how you feel after the procedure. If you start to have pain, write down when it hurts and how it feels. This will help you and your health care provider to know if you need an additional treatment.  Check your needle insertion site every day for signs of infection. Watch for: ? Redness, swelling, or pain. ? Fluid, blood, or pus.  Keep all follow-up visits as told by your health care provider. This is important. Contact a health care provider if:  Your pain does not get better.  You have redness, swelling, or pain at the needle insertion site.  You have fluid, blood, or pus coming from the needle insertion site.  You have a fever. Get help right away if:  You develop sudden, severe pain.  You develop numbness or tingling near the procedure site that does not go away. This information is not  intended to replace advice given to you by your health care provider. Make sure you discuss any questions you have with your health care provider. Document Released: 01/24/2011 Document Revised: 11/02/2015 Document Reviewed: 07/04/2014 Elsevier Interactive Patient Education  [employer].

## 2017-02-17 NOTE — Progress Notes (Signed)
Nursing Pain Medication Assessment:  Safety precautions to be maintained throughout the outpatient stay will include: orient to surroundings, keep bed in low position, maintain call bell within reach at all times, provide assistance with transfer out of bed and ambulation.  Medication Inspection Compliance: Pill count conducted under aseptic conditions, in front of the patient. Neither the pills nor the bottle was removed from the patient's sight at any time. Once count was completed pills were immediately returned to the patient in their original bottle.  Medication: Oxycodone IR Pill/Patch Count: 0 of 120 pills remain Pill/Patch Appearance:  Bottle Appearance: Standard pharmacy container. Clearly labeled. Filled Date: 08 / 09 / 2018 Last Medication intake:  Yesterday

## 2017-03-03 ENCOUNTER — Ambulatory Visit: Payer: Self-pay | Admitting: Pain Medicine

## 2017-03-04 ENCOUNTER — Telehealth: Payer: Self-pay | Admitting: *Deleted

## 2017-03-17 ENCOUNTER — Encounter: Payer: Self-pay | Admitting: Nurse Practitioner

## 2017-03-17 ENCOUNTER — Ambulatory Visit: Payer: Managed Care, Other (non HMO) | Attending: Nurse Practitioner | Admitting: Nurse Practitioner

## 2017-03-17 VITALS — BP 140/89 | HR 65 | Temp 98.0°F | Resp 16 | Ht 66.0 in | Wt 180.0 lb

## 2017-03-17 DIAGNOSIS — M5442 Lumbago with sciatica, left side: Secondary | ICD-10-CM

## 2017-03-17 DIAGNOSIS — G894 Chronic pain syndrome: Secondary | ICD-10-CM | POA: Diagnosis not present

## 2017-03-17 DIAGNOSIS — Z8261 Family history of arthritis: Secondary | ICD-10-CM | POA: Diagnosis not present

## 2017-03-17 DIAGNOSIS — Z833 Family history of diabetes mellitus: Secondary | ICD-10-CM | POA: Diagnosis not present

## 2017-03-17 DIAGNOSIS — M47816 Spondylosis without myelopathy or radiculopathy, lumbar region: Secondary | ICD-10-CM | POA: Diagnosis not present

## 2017-03-17 DIAGNOSIS — F17210 Nicotine dependence, cigarettes, uncomplicated: Secondary | ICD-10-CM | POA: Insufficient documentation

## 2017-03-17 DIAGNOSIS — G8929 Other chronic pain: Secondary | ICD-10-CM

## 2017-03-17 DIAGNOSIS — F4321 Adjustment disorder with depressed mood: Secondary | ICD-10-CM | POA: Insufficient documentation

## 2017-03-17 DIAGNOSIS — M48061 Spinal stenosis, lumbar region without neurogenic claudication: Secondary | ICD-10-CM | POA: Insufficient documentation

## 2017-03-17 DIAGNOSIS — Z9889 Other specified postprocedural states: Secondary | ICD-10-CM | POA: Insufficient documentation

## 2017-03-17 DIAGNOSIS — E559 Vitamin D deficiency, unspecified: Secondary | ICD-10-CM | POA: Diagnosis not present

## 2017-03-17 DIAGNOSIS — M47896 Other spondylosis, lumbar region: Secondary | ICD-10-CM | POA: Diagnosis not present

## 2017-03-17 DIAGNOSIS — Z809 Family history of malignant neoplasm, unspecified: Secondary | ICD-10-CM | POA: Insufficient documentation

## 2017-03-17 DIAGNOSIS — Z885 Allergy status to narcotic agent status: Secondary | ICD-10-CM | POA: Diagnosis not present

## 2017-03-17 DIAGNOSIS — Z8249 Family history of ischemic heart disease and other diseases of the circulatory system: Secondary | ICD-10-CM | POA: Insufficient documentation

## 2017-03-17 DIAGNOSIS — M7918 Myalgia, other site: Secondary | ICD-10-CM | POA: Insufficient documentation

## 2017-03-17 DIAGNOSIS — M549 Dorsalgia, unspecified: Secondary | ICD-10-CM | POA: Diagnosis not present

## 2017-03-17 DIAGNOSIS — M792 Neuralgia and neuritis, unspecified: Secondary | ICD-10-CM

## 2017-03-17 DIAGNOSIS — F419 Anxiety disorder, unspecified: Secondary | ICD-10-CM | POA: Diagnosis not present

## 2017-03-17 DIAGNOSIS — M47817 Spondylosis without myelopathy or radiculopathy, lumbosacral region: Secondary | ICD-10-CM | POA: Diagnosis not present

## 2017-03-17 MED ORDER — OXYCODONE HCL 10 MG PO TABS: 10.0000 mg | ORAL_TABLET | Freq: Four times a day (QID) | ORAL | 0 refills | Status: DC | PRN

## 2017-03-17 MED ORDER — GABAPENTIN 100 MG PO CAPS: 100.0000 mg | ORAL_CAPSULE | Freq: Every day | ORAL | 0 refills | Status: DC

## 2017-03-17 NOTE — Progress Notes (Signed)
Patient's Name: [name]  MRN: [id number]  Referring Provider: Arnetha Courser, MD  DOB: 02/18/1971  PCP: Arnetha Courser, MD  DOS: 03/17/2017  Note by: Vevelyn Francois NP  Service setting: Ambulatory outpatient  Specialty: Interventional Pain Management  Location: [name] (AMB) Pain Management Facility    Patient type: Established    Primary Reason(s) for Visit: Encounter for prescription drug management & post-procedure evaluation of chronic illness with mild to moderate exacerbation(Level of risk: moderate) CC: Back Pain (mid)  HPI  [name] is a 46 y.o. year old, female patient, who comes today for a post-procedure evaluation and medication management. She has Adjustment disorder with depressed mood; Stressful life events affecting family and household; Preventative health care; Vitamin D deficiency; Lumbosacral spondylosis without myelopathy; Lumbar central spinal stenosis (L4-5); Breast cancer screening; Bladder irritation; Chronic low back pain (Location of Primary Source of Pain) (midline); Arthralgia of multiple joints; Chronic, continuous use of opioids; Elevated rheumatoid factor; Chronic pain syndrome; Long term (current) use of opiate analgesic; Long term prescription opiate use; Opiate use (85.5 MME/Day); Chronic hip pain (Location of Secondary source of pain) (Left); Failed back surgical syndrome; Discogenic low back pain; Lumbar discogenic pain syndrome; Chronic sacroiliac joint pain (Left); Musculoskeletal pain; Lumbar facet syndrome (Bilateral); Lumbar spondylosis; Neurogenic pain; and Depression on her problem list. Her primarily concern today is the Back Pain (mid)  Pain Assessment: Location: Mid, Lower Back Radiating: does not radiate Onset: More than a month ago Duration: Chronic pain Quality: Aching, Constant Severity: 3 /10 (self-reported pain score)  Note: Reported level is compatible with observation.                    Effect on ADL: work in yard, stoopin, bending, "I  can't do what I used too" Timing: Constant Modifying factors: heat, muscle relaxers, heat  [name] was last seen on 02/17/2017 for a procedure. During today's appointment we reviewed [name]'s post-procedure results, as well as her outpatient medication regimen. She feels like the pain is centered in the middle. She feels like that the left side is worse. She feels like it may go from side to side with intensity. She is getting ready to proceed with her Physical Therapy again. She has started the Gabapentin and is up to 3 tabs per day. She was unsure if she should increase.   Further details on both, my assessment(s), as well as the proposed treatment plan, please see below.  Controlled Substance Pharmacotherapy Assessment REMS (Risk Evaluation and Mitigation Strategy)  Analgesic:Oxycodone IR 10 mg every 6 hours (53m/dayof oxycodone) (60MME/day) MME/day:676mday.  GaIgnatius SpeckingRN  03/17/2017 12:09 PM  Sign at close encounter Nursing Pain Medication Assessment:  Safety precautions to be maintained throughout the outpatient stay will include: orient to surroundings, keep bed in low position, maintain call bell within reach at all times, provide assistance with transfer out of bed and ambulation.  Medication Inspection Compliance: Pill count conducted under aseptic conditions, in front of the patient. Neither the pills nor the bottle was removed from the patient's sight at any time. Once count was completed pills were immediately returned to the patient in their original bottle.  Medication: See above Pill/Patch Count: 1 of 120 pills remain Pill/Patch Appearance: Markings consistent with prescribed medication Bottle Appearance: Standard pharmacy container. Clearly labeled. Filled Date: 09 / 10 / 2018 Last Medication intake:  Today   Pharmacokinetics: Liberation and absorption (onset of action): WNL Distribution (time to peak effect):  WNL Metabolism and excretion (duration of  action): WNL         Pharmacodynamics: Desired effects: Analgesia: [name] reports >50% benefit. Functional ability: Patient reports that medication allows her to accomplish basic ADLs Clinically meaningful improvement in function (CMIF): Sustained CMIF goals met Perceived effectiveness: Described as relatively effective, allowing for increase in activities of daily living (ADL) Undesirable effects: Side-effects or Adverse reactions: None reported Monitoring: [place] PMP: Online review of the past 71-monthperiod conducted. Compliant with practice rules and regulations Last UDS on record: Summary  Date Value Ref Range Status  12/05/2016 FINAL  Final    Comment:    ==================================================================== [facility] COMP DRUG ANALYSIS,UR ==================================================================== Test                             Result       Flag       Units Drug Present and Declared for Prescription Verification   Oxycodone                      386          EXPECTED   ng/mg creat   Oxymorphone                    4143         EXPECTED   ng/mg creat   Noroxycodone                   4130         EXPECTED   ng/mg creat   Noroxymorphone                 646          EXPECTED   ng/mg creat    Sources of oxycodone are scheduled prescription medications.    Oxymorphone, noroxycodone, and noroxymorphone are expected    metabolites of oxycodone. Oxymorphone is also available as a    scheduled prescription medication.   Acetaminophen                  PRESENT      EXPECTED Drug Absent but Declared for Prescription Verification   Duloxetine                     Not Detected UNEXPECTED ==================================================================== Test                      Result    Flag   Units      Ref Range   Creatinine              207              mg/dL      >=20 ==================================================================== Declared Medications:   The flagging and interpretation on this report are based on the  following declared medications.  Unexpected results may arise from  inaccuracies in the declared medications.  **Note: The testing scope of this panel includes these medications:  Duloxetine  Oxycodone  Oxycodone (Oxycodone Acetaminophen)  **Note: The testing scope of this panel does not include small to  moderate amounts of these reported medications:  Acetaminophen (Oxycodone Acetaminophen)  **Note: The testing scope of this panel does not include following  reported medications:  Meloxicam ==================================================================== For clinical consultation, please call ([phone]====    UDS interpretation: Compliant  Medication Assessment Form: Reviewed. Patient indicates being compliant with therapy Treatment compliance: Compliant Risk Assessment Profile: Aberrant behavior: See prior evaluations. None observed or detected today Comorbid factors increasing risk of overdose: See prior notes. No additional risks detected today Risk of substance use disorder (SUD): Low     Opioid Risk Tool - 03/17/17 1207      Family History of Substance Abuse   Alcohol Negative   Illegal Drugs Negative   Rx Drugs Negative     Personal History of Substance Abuse   Alcohol Negative   Illegal Drugs Negative   Rx Drugs Negative     Age   Age between 5-45 years  No     History of Preadolescent Sexual Abuse   History of Preadolescent Sexual Abuse Negative or Female     Psychological Disease   Psychological Disease Positive   ADD Negative   OCD Negative   Bipolar Negative   Schizophrenia Negative   Depression Positive     Total Score   Opioid Risk Tool Scoring 3   Opioid Risk Interpretation Low Risk     ORT Scoring interpretation table:  Score <3 = Low Risk for SUD  Score between 4-7 = Moderate Risk for SUD   Score >8 = High Risk for Opioid Abuse   Risk Mitigation Strategies:  Patient Counseling: Covered Patient-Prescriber Agreement (PPA): Present and active  Notification to other healthcare providers: Done  Pharmacologic Plan: No change in therapy, at this time  Post-Procedure Assessment   Procedure:01/30/17 bilateral Lumbar Facet, no steriod Pre-procedure pain score:  2/10 Post-procedure pain score: 0/10         Influential Factors: BMI: 29.05 kg/m Intra-procedural challenges: None observed.         Assessment challenges: None detected.              Reported side-effects: None.        Post-procedural adverse reactions or complications: None reported         Sedation: Please see nurses note. When no sedatives are used, the analgesic levels obtained are directly associated to the effectiveness of the local anesthetics. However, when sedation is provided, the level of analgesia obtained during the initial 1 hour following the intervention, is believed to be the result of a combination of factors. These factors may include, but are not limited to: 1. The effectiveness of the local anesthetics used. 2. The effects of the analgesic(s) and/or anxiolytic(s) used. 3. The degree of discomfort experienced by the patient at the time of the procedure. 4. The patients ability and reliability in recalling and recording the events. 5. The presence and influence of possible secondary gains and/or psychosocial factors. Reported result: Relief experienced during the 1st hour after the procedure: 100 % (Ultra-Short Term Relief)            Interpretative annotation: Clinically appropriate result. Analgesia during this period is likely to be Local Anesthetic and/or IV Sedative (Analgesic/Anxiolytic) related.          Effects of local anesthetic: The analgesic effects attained during this period are directly associated to the localized infiltration of local anesthetics and therefore cary significant diagnostic  value as to the etiological location, or anatomical origin, of the pain. Expected duration of relief is directly dependent on the pharmacodynamics of the local anesthetic used. Long-acting (4-6 hours) anesthetics used.  Reported result: Relief during the next 4 to 6 hour after the procedure: 100 % (Short-Term Relief)  Interpretative annotation: Clinically appropriate result. Analgesia during this period is likely to be Local Anesthetic-related.          Long-term benefit: Defined as the period of time past the expected duration of local anesthetics (1 hour for short-acting and 4-6 hours for long-acting). With the possible exception of prolonged sympathetic blockade from the local anesthetics, benefits during this period are typically attributed to, or associated with, other factors such as analgesic sensory neuropraxia, antiinflammatory effects, or beneficial biochemical changes provided by agents other than the local anesthetics.  Reported result: Extended relief following procedure: 0 % (Long-Term Relief)            Interpretative annotation: Clinically appropriate result. Good relief. No permanent benefit expected. Inflammation plays a part in the etiology to the pain.          Current benefits: Defined as reported results that persistent at this point in time.   Analgesia: 0 % Ms. Roes reports improvement of axial symptoms. Function: Somewhat improved ROM: Back to baseline Interpretative annotation: Short-term benefit. No long-term benefit expected. No steroids injected.   Interpretation: Results would suggest Ms. Cerney to be a good candidate for Radiofrequency Ablation. The patient has failed to respond to conservative therapies including over-the-counter medications, anti-inflammatories, muscle relaxants, membrane stabilizers, opioids, physical therapy modalities such as heat and ice, as well as more invasive techniques such as nerve blocks. Because Ms. Merriweather did attain more than 50% relief  of the pain during a series of diagnostic blocks conducted in separate occasions, I believe it is medically necessary to proceed with Radiofrequency Ablation, in order to attempt gaining longer relief.          Plan:  Proceed with Radiofrequency Ablation for the purpose of attaining long-term benefits.       Laboratory Chemistry  Inflammation Markers (CRP: Acute Phase) (ESR: Chronic Phase) Lab Results  Component Value Date   CRP 5.3 (H) 12/05/2016   ESRSEDRATE 9 12/05/2016                 Renal Function Markers Lab Results  Component Value Date   BUN 17 12/05/2016   CREATININE 0.84 12/05/2016   GFRAA 96 12/05/2016   GFRNONAA 83 12/05/2016                 Hepatic Function Markers Lab Results  Component Value Date   AST 21 12/05/2016   ALT 20 12/05/2016   ALBUMIN 4.4 12/05/2016   ALKPHOS 59 12/05/2016                 Electrolytes Lab Results  Component Value Date   NA 139 12/05/2016   K 4.6 12/05/2016   CL 103 12/05/2016   CALCIUM 9.5 12/05/2016   MG 2.1 12/05/2016                 Neuropathy Markers Lab Results  Component Value Date   VITAMINB12 495 12/05/2016                 Bone Pathology Markers Lab Results  Component Value Date   ALKPHOS 59 12/05/2016   VD25OH 27.1 (L) 09/18/2016   25OHVITD1 49 12/05/2016   25OHVITD2 <1.0 12/05/2016   25OHVITD3 49 12/05/2016   CALCIUM 9.5 12/05/2016                 Coagulation Parameters Lab Results  Component Value Date   INR 0.9 04/16/2013   LABPROT 12.4 04/16/2013   PLT 287 10/02/2016  Cardiovascular Markers Lab Results  Component Value Date   HGB 13.3 10/02/2016   HCT 40.8 10/02/2016                 Note: Lab results reviewed.  Recent Diagnostic Imaging Results  DG C-Arm 1-60 Min-No Report Fluoroscopy was utilized by the requesting physician.  No radiographic  interpretation.   Complexity Note: Imaging results reviewed. Results shared with Ms. Gane, using Layman's terms.                          Meds   Current Outpatient Prescriptions:  .  cyclobenzaprine (FLEXERIL) 10 MG tablet, Take 1 tablet (10 mg total) by mouth at bedtime., Disp: 30 tablet, Rfl: 0 .  DULoxetine (CYMBALTA) 30 MG capsule, Take 1 capsule (30 mg total) by mouth 2 (two) times daily., Disp: 60 capsule, Rfl: 0 .  gabapentin (NEURONTIN) 100 MG capsule, Take 1-3 capsules (100-300 mg total) by mouth at bedtime., Disp: 90 capsule, Rfl: 0 .  meloxicam (MOBIC) 15 MG tablet, Take 1 tablet (15 mg total) by mouth daily., Disp: 30 tablet, Rfl: 0 .  [START ON 03/19/2017] Oxycodone HCl 10 MG TABS, Take 1 tablet (10 mg total) by mouth every 6 (six) hours as needed., Disp: 120 tablet, Rfl: 0  ROS  Constitutional: Denies any fever or chills Gastrointestinal: No reported hemesis, hematochezia, vomiting, or acute GI distress Musculoskeletal: Denies any acute onset joint swelling, redness, loss of ROM, or weakness Neurological: No reported episodes of acute onset apraxia, aphasia, dysarthria, agnosia, amnesia, paralysis, loss of coordination, or loss of consciousness  Allergies  Ms. Trew is allergic to codeine.  PFSH  Drug: Ms. Andujo  reports that she does not use drugs. Alcohol:  reports that she does not drink alcohol. Tobacco:  reports that she has been smoking Cigarettes.  She has a 20.00 pack-year smoking history. She has never used smokeless tobacco. Medical:  has a past medical history of Allergy; Anxiety; Chronic bilateral low back pain (07/22/2016); and Depression. Surgical: Ms. Garant  has a past surgical history that includes Abdominal hysterectomy; Tonsillectomy; Cesarean section; Spine surgery; Spine surgery (09/09/2015); and Back surgery. Family: family history includes Arthritis in her father; Cancer in her maternal aunt; Depression in her sister and sister; Diabetes in her mother; Heart disease in her father and maternal grandfather; Hyperlipidemia in her father; Hypertension in her father and mother; Lung  disease in her father.  Constitutional Exam  General appearance: Well nourished, well developed, and well hydrated. In no apparent acute distress Vitals:   03/17/17 1159  BP: 140/89  Pulse: 65  Resp: 16  Temp: 98 F (36.7 C)  SpO2: 100%  Weight: 180 lb (81.6 kg)  Height: _0  (1.676 m)   BMI Assessment: Estimated body mass index is 29.05 kg/m as calculated from the following:   Height as of this encounter: _1  (1.676 m).   Weight as of this encounter: 180 lb (81.6 kg). Psych/Mental status: Alert, oriented x 3 (person, place, & time)       Eyes: PERLA Respiratory: No evidence of acute respiratory distress  Cervical Spine Area Exam  Skin & Axial Inspection: No masses, redness, edema, swelling, or associated skin lesions Alignment: Symmetrical Functional ROM: Unrestricted ROM      Stability: No instability detected Muscle Tone/Strength: Functionally intact. No obvious neuro-muscular anomalies detected. Sensory (Neurological): Unimpaired Palpation: No palpable anomalies  Upper Extremity (UE) Exam    Side: Right upper extremity  Side: Left upper extremity  Skin & Extremity Inspection: Skin color, temperature, and hair growth are WNL. No peripheral edema or cyanosis. No masses, redness, swelling, asymmetry, or associated skin lesions. No contractures.  Skin & Extremity Inspection: Skin color, temperature, and hair growth are WNL. No peripheral edema or cyanosis. No masses, redness, swelling, asymmetry, or associated skin lesions. No contractures.  Functional ROM: Unrestricted ROM          Functional ROM: Unrestricted ROM          Muscle Tone/Strength: Functionally intact. No obvious neuro-muscular anomalies detected.  Muscle Tone/Strength: Functionally intact. No obvious neuro-muscular anomalies detected.  Sensory (Neurological): Unimpaired          Sensory (Neurological): Unimpaired          Palpation: No palpable anomalies              Palpation: No palpable anomalies               Specialized Test(s): Deferred         Specialized Test(s): Deferred          Thoracic Spine Area Exam  Skin & Axial Inspection: No masses, redness, or swelling Alignment: Symmetrical Functional ROM: Unrestricted ROM Stability: No instability detected Muscle Tone/Strength: Functionally intact. No obvious neuro-muscular anomalies detected. Sensory (Neurological): Unimpaired Muscle strength & Tone: No palpable anomalies  Lumbar Spine Area Exam  Skin & Axial Inspection: No masses, redness, or swelling Alignment: Symmetrical Functional ROM: Unrestricted ROM      Stability: No instability detected Muscle Tone/Strength: Functionally intact. No obvious neuro-muscular anomalies detected. Sensory (Neurological): Unimpaired Palpation: Complains of area being tender to palpation       Provocative Tests: Lumbar Hyperextension and rotation test: Positive bilaterally for facet joint pain. Lumbar Lateral bending test: evaluation deferred today       Patrick's Maneuver: evaluation deferred today                    Gait & Posture Assessment  Ambulation: Unassisted Gait: Relatively normal for age and body habitus Posture: WNL   Lower Extremity Exam    Side: Right lower extremity  Side: Left lower extremity  Skin & Extremity Inspection: Skin color, temperature, and hair growth are WNL. No peripheral edema or cyanosis. No masses, redness, swelling, asymmetry, or associated skin lesions. No contractures.  Skin & Extremity Inspection: Skin color, temperature, and hair growth are WNL. No peripheral edema or cyanosis. No masses, redness, swelling, asymmetry, or associated skin lesions. No contractures.  Functional ROM: Unrestricted ROM          Functional ROM: Unrestricted ROM          Muscle Tone/Strength: Functionally intact. No obvious neuro-muscular anomalies detected.  Muscle Tone/Strength: Functionally intact. No obvious neuro-muscular anomalies detected.  Sensory (Neurological):  Unimpaired  Sensory (Neurological): Unimpaired  Palpation: No palpable anomalies  Palpation: No palpable anomalies   Assessment  Primary Diagnosis & Pertinent Problem List: The primary encounter diagnosis was Chronic low back pain (Location of Primary Source of Pain) (midline). Diagnoses of Lumbar facet syndrome (Bilateral), Lumbar spondylosis, Chronic pain syndrome, and Neurogenic pain were also pertinent to this visit.  Status Diagnosis  Controlled Controlled Controlled 1. Chronic low back pain (Location of Primary Source of Pain) (midline)   2. Lumbar facet syndrome (Bilateral)   3. Lumbar spondylosis   4. Chronic pain syndrome   5. Neurogenic pain  Problems updated and reviewed during this visit: No problems updated. Plan of Care  Pharmacotherapy (Medications Ordered): Meds ordered this encounter  Medications  . Oxycodone HCl 10 MG TABS    Sig: Take 1 tablet (10 mg total) by mouth every 6 (six) hours as needed.    Dispense:  120 tablet    Refill:  0    Do not place this medication, or any other prescription from our practice, on "Automatic Refill". Patient may have prescription filled one day early if pharmacy is closed on scheduled refill date. Do not fill until:03/19/2017 To last until: 04/18/2017    Order Specific Question:   Supervising Provider    Answer:   Milinda Pointer [phone]  . gabapentin (NEURONTIN) 100 MG capsule    Sig: Take 1-3 capsules (100-300 mg total) by mouth at bedtime.    Dispense:  90 capsule    Refill:  0    Do not place this medication, or any other prescription from our practice, on "Automatic Refill". Patient may have prescription filled one day early if pharmacy is closed on scheduled refill date.    Order Specific Question:   Supervising Provider    Answer:   Milinda Pointer [[id number]]   New Prescriptions   No medications on file   Medications administered today: Ms. Rosander had no medications administered during this visit. Lab-work,  procedure(s), and/or referral(s): Orders Placed This Encounter  Procedures  . Radiofrequency,Lumbar   Imaging and/or referral(s): None  Interventional management options: Planned, scheduled, and/or pending:  Left lumbar facet RFA   Considering:  Diagnostic bilateral lumbar facet block Possible bilateral lumbar facet RFA Diagnostic left sided caudal epidural steroid injection + diagnostic epidurogram Possible left-sided Racz procedure Diagnostic left L4-5 interlaminar lumbar epidural steroid injection Diagnosticleft L4-5 transforaminal epidural steroid injection Diagnostic left L4 selective nerve root block Possible left L4 nerve root ganglion RFA Possible lumbar spinal cord stimulator trial   Palliative PRN treatment(s):  Diagnostic bilateral lumbar facet block(NO STEROIDS)     Provider-requested follow-up: Return for Procedure(w/Sedation), w/ Dr. Dossie Arbour, ([facility]).  Future Appointments Date Time Provider [place]  04/14/2017 11:00 AM Vevelyn Francois, NP [facility] None   Primary Care Physician: Arnetha Courser, MD Location: [facility] Note by: Vevelyn Francois NP Date: 03/17/2017; Time: 2:54 PM  Pain Score Disclaimer: We use the NRS-11 scale. This is a self-reported, subjective measurement of pain severity with only modest accuracy. It is used primarily to identify changes within a particular patient. It must be understood that outpatient pain scales are significantly less accurate that those used for research, where they can be applied under ideal controlled circumstances with minimal exposure to variables. In reality, the score is likely to be a combination of pain intensity and pain affect, where pain affect describes the degree of emotional arousal or changes in action readiness caused by the sensory experience of pain. Factors such as social and work situation, setting, emotional state, anxiety levels, expectation, and prior  pain experience may influence pain perception and show large inter-individual differences that may also be affected by time variables.  Patient instructions provided during this appointment: Patient Instructions    ____________________________________________________________________________________________  Medication Rules  Applies to: All patients receiving prescriptions (written or electronic).  Pharmacy of record: Pharmacy where electronic prescriptions will be sent. If written prescriptions are taken to a different pharmacy, please inform the nursing staff. The pharmacy listed in the electronic medical record should be the one where you would like  electronic prescriptions to be sent.  Prescription refills: Only during scheduled appointments. Applies to both, written and electronic prescriptions.  NOTE: The following applies primarily to controlled substances (Opioid* Pain Medications).   Patient's responsibilities: 1. Pain Pills: Bring all pain pills to every appointment (except for procedure appointments). 2. Pill Bottles: Bring pills in original pharmacy bottle. Always bring newest bottle. Bring bottle, even if empty. 3. Medication refills: You are responsible for knowing and keeping track of what medications you need refilled. The day before your appointment, write a list of all prescriptions that need to be refilled. Bring that list to your appointment and give it to the admitting nurse. Prescriptions will be written only during appointments. If you forget a medication, it will not be "Called in", "Faxed", or "electronically sent". You will need to get another appointment to get these prescribed. 4. Prescription Accuracy: You are responsible for carefully inspecting your prescriptions before leaving our office. Have the discharge nurse carefully go over each prescription with you, before taking them home. Make sure that your name is accurately spelled, that your address is correct.  Check the name and dose of your medication to make sure it is accurate. Check the number of pills, and the written instructions to make sure they are clear and accurate. Make sure that you are given enough medication to last until your next medication refill appointment. 5. Taking Medication: Take medication as prescribed. Never take more pills than instructed. Never take medication more frequently than prescribed. Taking less pills or less frequently is permitted and encouraged, when it comes to controlled substances (written prescriptions).  6. Inform other Doctors: Always inform, all of your healthcare providers, of all the medications you take. 7. Pain Medication from other Providers: You are not allowed to accept any additional pain medication from any other Doctor or Healthcare provider. There are two exceptions to this rule. (see below) In the event that you require additional pain medication, you are responsible for notifying us, as stated below. 8. Medication Agreement: You are responsible for carefully reading and following our Medication Agreement. This must be signed before receiving any prescriptions from our practice. Safely store a copy of your signed Agreement. Violations to the Agreement will result in no further prescriptions. (Additional copies of our Medication Agreement are available upon request.) 9. Laws, Rules, & Regulations: All patients are expected to follow all Federal and [employer], [employer], Rules, [employer] [facility]. Ignorance of the Laws does not constitute a valid excuse. The use of any illegal substances is prohibited. 10. Adopted CDC guidelines & recommendations: Target dosing levels will be at or below 60 MME/day. Use of benzodiazepines** is not recommended.  Exceptions: There are only two exceptions to the rule of not receiving pain medications from other Healthcare Providers. 1. Exception #1 (Emergencies): In the event of an emergency (i.e.: accident requiring emergency  care), you are allowed to receive additional pain medication. However, you are responsible for: As soon as you are able, call our office [phone], at any time of the day or night, and leave a message stating your name, the date and nature of the emergency, and the name and dose of the medication prescribed. In the event that your call is answered by a member of our staff, make sure to document and save the date, time, and the name of the person that took your information.  2. Exception #2 (Planned Surgery): In the event that you are scheduled by another doctor or dentist to have any  type of surgery or procedure, you are allowed (for a period no longer than 30 days), to receive additional pain medication, for the acute post-op pain. However, in this case, you are responsible for picking up a copy of our "Post-op Pain Management for Surgeons" handout, and giving it to your surgeon or dentist. This document is available at our office, and does not require an appointment to obtain it. Simply go to our office during business hours (Monday-Thursday from 8:00 AM to 4:00 PM) (Friday 8:00 AM to 12:00 Noon) or if you have a scheduled appointment with [place], prior to your surgery, and ask for it by name. In addition, you will need to provide [place] with your name, name of your surgeon, type of surgery, and date of procedure or surgery.  *Opioid medications include: morphine, codeine, oxycodone, oxymorphone, hydrocodone, hydromorphone, meperidine, tramadol, tapentadol, buprenorphine, fentanyl, methadone. **Benzodiazepine medications include: diazepam (Valium), alprazolam (Xanax), clonazepam (Klonopine), lorazepam (Ativan), clorazepate (Tranxene), chlordiazepoxide (Librium), estazolam (Prosom), oxazepam (Serax), temazepam (Restoril), triazolam  (Halcion)  ____________________________________________________________________________________________  ____________________________________________________________________________________________  Appointment Policy Summary  It is our goal and responsibility to provide the medical community with assistance in the evaluation and management of patients with chronic pain. Unfortunately our resources are limited. Because we do not have an unlimited amount of time, or available appointments, we are required to closely monitor and manage their use. The following rules exist to maximize their use:  Patient's responsibilities: 1. Punctuality:  At what time should I arrive? You should be physically present in our office 30 minutes before your scheduled appointment. Your scheduled appointment is with your assigned healthcare provider. However, it takes 5-10 minutes to be "checked-in", and another 15 minutes for the nurses to do the admission. If you arrive to our office at the time you were given for your appointment, you will end up being at least 20-25 minutes late to your appointment with the provider. 2. Tardiness:  What happens if I arrive only a few minutes after my scheduled appointment time? You will need to reschedule your appointment. The cutoff is your appointment time. This is why it is so important that you arrive at least 30 minutes before that appointment. If you have an appointment scheduled for 10:00 AM and you arrive at 10:01, you will be required to reschedule your appointment.  3. Plan ahead:  Always assume that you will encounter traffic on your way in. Plan for it. If you are dependent on a driver, make sure they understand these rules and the need to arrive early. 4. Other appointments and responsibilities:  Avoid scheduling any other appointments before or after your pain clinic appointments.  5. Be prepared:  Write down everything that you need to discuss with your healthcare  provider and give this information to the admitting nurse. Write down the medications that you will need refilled. Bring your pills and bottles (even the empty ones), to all of your appointments, except for those where a procedure is scheduled. 6. No children or pets:  Find someone to take care of them. It is not appropriate to bring them in. 7. Scheduling changes:  We request "advanced notification" of any changes or cancellations. 8. Advanced notification:  Defined as a time period of more than 24 hours prior to the originally scheduled appointment. This allows for the appointment to be offered to other patients. 9. Rescheduling:  When a visit is rescheduled, it will require the cancellation of the original appointment. For this reason they both fall within the category  of "Cancellations".  10. Cancellations:  They require advanced notification. Any cancellation less than 24 hours before the  appointment will be recorded as a "No Show". 11. No Show:  Defined as an unkept appointment where the patient failed to notify or declare to the practice their intention or inability to keep the appointment.  Corrective process for repeat offenders:  1. Tardiness: Three (3) episodes of rescheduling due to late arrivals will be recorded as one (1) "No Show". 2. Cancellation or reschedule: Three (3) cancellations or rescheduling will be recorded as one (1) "No Show". 3. "No Shows": Three (3) "No Shows" within a 12 month period will result in discharge from the practice.  ____________________________________________________________________________________________  BMI Assessment: Estimated body mass index is 29.05 kg/m as calculated from the following:   Height as of this encounter: _0  (1.676 m).   Weight as of this encounter: 180 lb (81.6 kg).  BMI interpretation table: BMI level Category Range association with higher incidence of chronic pain  <18 kg/m2 Underweight   18.5-24.9 kg/m2 Ideal body  weight   25-29.9 kg/m2 Overweight Increased incidence by 20%  30-34.9 kg/m2 Obese (Class I) Increased incidence by 68%  35-39.9 kg/m2 Severe obesity (Class II) Increased incidence by 136%  >40 kg/m2 Extreme obesity (Class III) Increased incidence by 254%   BMI Readings from Last 4 Encounters:  03/17/17 29.05 kg/m  02/17/17 29.05 kg/m  01/30/17 29.05 kg/m  01/14/17 29.86 kg/m   Wt Readings from Last 4 Encounters:  03/17/17 180 lb (81.6 kg)  02/17/17 180 lb (81.6 kg)  01/30/17 180 lb (81.6 kg)  01/14/17 185 lb (83.9 kg)   GENERAL RISKS AND COMPLICATIONS  What are the risk, side effects and possible complications? Generally speaking, most procedures are safe.  However, with any procedure there are risks, side effects, and the possibility of complications.  The risks and complications are dependent upon the sites that are lesioned, or the type of nerve block to be performed.  The closer the procedure is to the spine, the more serious the risks are.  Great care is taken when placing the radio frequency needles, block needles or lesioning probes, but sometimes complications can occur. 1. Infection: Any time there is an injection through the skin, there is a risk of infection.  This is why sterile conditions are used for these blocks.  There are four possible types of infection. 1. Localized skin infection. 2. Central Nervous System Infection-This can be in the form of Meningitis, which can be deadly. 3. Epidural Infections-This can be in the form of an epidural abscess, which can cause pressure inside of the spine, causing compression of the spinal cord with subsequent paralysis. This would require an emergency surgery to decompress, and there are no guarantees that the patient would recover from the paralysis. 4. Discitis-This is an infection of the intervertebral discs.  It occurs in about 1% of discography procedures.  It is difficult to treat and it may lead to surgery.        2. Pain:  the needles have to go through skin and soft tissues, will cause soreness.       3. Damage to internal structures:  The nerves to be lesioned may be near blood vessels or    other nerves which can be potentially damaged.       4. Bleeding: Bleeding is more common if the patient is taking blood thinners such as  aspirin, Coumadin, Ticiid, Plavix, etc., or if he/she have some genetic  predisposition  such as hemophilia. Bleeding into the spinal canal can cause compression of the spinal  cord with subsequent paralysis.  This would require an emergency surgery to  decompress and there are no guarantees that the patient would recover from the  paralysis.       5. Pneumothorax:  Puncturing of a lung is a possibility, every time a needle is introduced in  the area of the chest or upper back.  Pneumothorax refers to free air around the  collapsed lung(s), inside of the thoracic cavity (chest cavity).  Another two possible  complications related to a similar event would include: Hemothorax and Chylothorax.   These are variations of the Pneumothorax, where instead of air around the collapsed  lung(s), you may have blood or chyle, respectively.       6. Spinal headaches: They may occur with any procedures in the area of the spine.       7. Persistent CSF (Cerebro-Spinal Fluid) leakage: This is a rare problem, but may occur  with prolonged intrathecal or epidural catheters either due to the formation of a fistulous  track or a dural tear.       8. Nerve damage: By working so close to the spinal cord, there is always a possibility of  nerve damage, which could be as serious as a permanent spinal cord injury with  paralysis.       9. Death:  Although rare, severe deadly allergic reactions known as "Anaphylactic  reaction" can occur to any of the medications used.      10. Worsening of the symptoms:  We can always make thing worse.  What are the chances of something like this happening? Chances of any of this occuring  are extremely low.  By statistics, you have more of a chance of getting killed in a motor vehicle accident: while driving to the hospital than any of the above occurring .  Nevertheless, you should be aware that they are possibilities.  In general, it is similar to taking a shower.  Everybody knows that you can slip, hit your head and get killed.  Does that mean that you should not shower again?  Nevertheless always keep in mind that statistics do not mean anything if you happen to be on the wrong side of them.  Even if a procedure has a 1 (one) in a 1,000,000 (million) chance of going wrong, it you happen to be that one..Also, keep in mind that by statistics, you have more of a chance of having something go wrong when taking medications.  Who should not have this procedure? If you are on a blood thinning medication (e.g. Coumadin, Plavix, see list of "Blood Thinners"), or if you have an active infection going on, you should not have the procedure.  If you are taking any blood thinners, please inform your physician.  How should I prepare for this procedure?  Do not eat or drink anything at least six hours prior to the procedure.  Bring a driver with you .  It cannot be a taxi.  Come accompanied by an adult that can drive you back, and that is strong enough to help you if your legs get weak or numb from the local anesthetic.  Take all of your medicines the morning of the procedure with just enough water to swallow them.  If you have diabetes, make sure that you are scheduled to have your procedure done first thing in the morning, whenever possible.  If  you have diabetes, take only half of your insulin dose and notify our nurse that you have done so as soon as you arrive at the clinic.  If you are diabetic, but only take blood sugar pills (oral hypoglycemic), then do not take them on the morning of your procedure.  You may take them after you have had the procedure.  Do not take aspirin or any  aspirin-containing medications, at least eleven (11) days prior to the procedure.  They may prolong bleeding.  Wear loose fitting clothing that may be easy to take off and that you would not mind if it got stained with Betadine or blood.  Do not wear any jewelry or perfume  Remove any nail coloring.  It will interfere with some of our monitoring equipment.  NOTE: Remember that this is not meant to be interpreted as a complete list of all possible complications.  Unforeseen problems may occur.  BLOOD THINNERS The following drugs contain aspirin or other products, which can cause increased bleeding during surgery and should not be taken for 2 weeks prior to and 1 week after surgery.  If you should need take something for relief of minor pain, you may take acetaminophen which is found in Tylenol,m Datril, Anacin-3 and Panadol. It is not blood thinner. The products listed below are.  Do not take any of the products listed below in addition to any listed on your instruction sheet.  A.P.C or A.P.C with Codeine Codeine Phosphate Capsules #3 Ibuprofen Ridaura  ABC compound Congesprin Imuran rimadil  Advil Cope Indocin Robaxisal  Alka-Seltzer Effervescent Pain Reliever and Antacid Coricidin or Coricidin-D  Indomethacin Rufen  Alka-Seltzer plus Cold Medicine Cosprin Ketoprofen S-A-C Tablets  Anacin Analgesic Tablets or Capsules Coumadin Korlgesic Salflex  Anacin Extra Strength Analgesic tablets or capsules CP-2 Tablets Lanoril Salicylate  Anaprox Cuprimine Capsules Levenox Salocol  Anexsia-D Dalteparin Magan Salsalate  Anodynos Darvon compound Magnesium Salicylate Sine-off  Ansaid Dasin Capsules Magsal Sodium Salicylate  Anturane Depen Capsules Marnal Soma  APF Arthritis pain formula Dewitt's Pills Measurin Stanback  Argesic Dia-Gesic Meclofenamic Sulfinpyrazone  Arthritis Bayer Timed Release Aspirin Diclofenac Meclomen Sulindac  Arthritis pain formula Anacin Dicumarol Medipren Supac  Analgesic  (Safety coated) Arthralgen Diffunasal Mefanamic Suprofen  Arthritis Strength Bufferin Dihydrocodeine Mepro Compound Suprol  Arthropan liquid Dopirydamole Methcarbomol with Aspirin Synalgos  ASA tablets/Enseals Disalcid Micrainin Tagament  Ascriptin Doan's Midol Talwin  Ascriptin A/D Dolene Mobidin Tanderil  Ascriptin Extra Strength Dolobid Moblgesic Ticlid  Ascriptin with Codeine Doloprin or Doloprin with Codeine Momentum Tolectin  Asperbuf Duoprin Mono-gesic Trendar  Aspergum Duradyne Motrin or Motrin IB Triminicin  Aspirin plain, buffered or enteric coated Durasal Myochrisine Trigesic  Aspirin Suppositories Easprin Nalfon Trillsate  Aspirin with Codeine Ecotrin Regular or Extra Strength Naprosyn Uracel  Atromid-S Efficin Naproxen Ursinus  Auranofin Capsules Elmiron Neocylate Vanquish  Axotal Emagrin Norgesic Verin  Azathioprine Empirin or Empirin with Codeine Normiflo Vitamin E  Azolid Emprazil Nuprin Voltaren  Bayer Aspirin plain, buffered or children's or timed BC Tablets or powders Encaprin Orgaran Warfarin Sodium  Buff-a-Comp Enoxaparin Orudis Zorpin  Buff-a-Comp with Codeine Equegesic Os-Cal-Gesic   Buffaprin Excedrin plain, buffered or Extra Strength Oxalid   Bufferin Arthritis Strength Feldene Oxphenbutazone   Bufferin plain or Extra Strength Feldene Capsules Oxycodone with Aspirin   Bufferin with Codeine Fenoprofen Fenoprofen Pabalate or Pabalate-SF   Buffets II Flogesic Panagesic   Buffinol plain or Extra Strength Florinal or Florinal with Codeine Panwarfarin   Buf-Tabs Flurbiprofen Penicillamine   Butalbital Compound  Four-way cold tablets Penicillin   Butazolidin Fragmin Pepto-Bismol   Carbenicillin Geminisyn Percodan   Carna Arthritis Reliever Geopen Persantine   Carprofen Gold's salt Persistin   Chloramphenicol Goody's Phenylbutazone   Chloromycetin Haltrain Piroxlcam   Clmetidine heparin Plaquenil   Cllnoril Hyco-pap Ponstel   Clofibrate Hydroxy chloroquine  Propoxyphen         Before stopping any of these medications, be sure to consult the physician who ordered them.  Some, such as Coumadin (Warfarin) are ordered to prevent or treat serious conditions such as "deep thrombosis", "pumonary embolisms", and other heart problems.  The amount of time that you may need off of the medication may also vary with the medication and the reason for which you were taking it.  If you are taking any of these medications, please make sure you notify your pain physician before you undergo any procedures.         Radiofrequency Lesioning Radiofrequency lesioning is a procedure that is performed to relieve pain. The procedure is often used for back, neck, or arm pain. Radiofrequency lesioning involves the use of a machine that creates radio waves to make heat. During the procedure, the heat is applied to the nerve that carries the pain signal. The heat damages the nerve and interferes with the pain signal. Pain relief usually starts about 2 weeks after the procedure and lasts for 6 months to 1 year. Tell a health care provider about:  Any allergies you have.  All medicines you are taking, including vitamins, herbs, eye drops, creams, and over-the-counter medicines.  Any problems you or family members have had with anesthetic medicines.  Any blood disorders you have.  Any surgeries you have had.  Any medical conditions you have.  Whether you are pregnant or may be pregnant. What are the risks? Generally, this is a safe procedure. However, problems may occur, including:  Pain or soreness at the injection site.  Infection at the injection site.  Damage to nerves or blood vessels.  What happens before the procedure?  Ask your health care provider about: ? Changing or stopping your regular medicines. This is especially important if you are taking diabetes medicines or blood thinners. ? Taking medicines such as aspirin and ibuprofen. These medicines  can thin your blood. Do not take these medicines before your procedure if your health care provider instructs you not to.  Follow instructions from your health care provider about eating or drinking restrictions.  Plan to have someone take you home after the procedure.  If you go home right after the procedure, plan to have someone with you for 24 hours. What happens during the procedure?  You will be given one or more of the following: ? A medicine to help you relax (sedative). ? A medicine to numb the area (local anesthetic).  You will be awake during the procedure. You will need to be able to talk with the health care provider during the procedure.  With the help of a type of X-[place] (fluoroscopy), the health care provider will insert a radiofrequency needle into the area to be treated.  Next, a wire that carries the radio waves (electrode) will be put through the radiofrequency needle. An electrical pulse will be sent through the electrode to verify the correct nerve. You will feel a tingling sensation, and you may have muscle twitching.  Then, the tissue that is around the needle tip will be heated by an electric current that is passed using the radiofrequency machine. This  will numb the nerves.  A bandage (dressing) will be put on the insertion area after the procedure is done. The procedure may vary among health care providers and hospitals. What happens after the procedure?  Your blood pressure, heart rate, breathing rate, and blood oxygen level will be monitored often until the medicines you were given have worn off.  Return to your normal activities as directed by your health care provider. This information is not intended to replace advice given to you by your health care provider. Make sure you discuss any questions you have with your health care provider. Document Released: 01/23/2011 Document Revised: 11/02/2015 Document Reviewed: 07/04/2014 Elsevier Interactive Patient  Education  [employer].

## 2017-03-17 NOTE — Progress Notes (Signed)
Nursing Pain Medication Assessment:  Safety precautions to be maintained throughout the outpatient stay will include: orient to surroundings, keep bed in low position, maintain call bell within reach at all times, provide assistance with transfer out of bed and ambulation.  Medication Inspection Compliance: Pill count conducted under aseptic conditions, in front of the patient. Neither the pills nor the bottle was removed from the patient's sight at any time. Once count was completed pills were immediately returned to the patient in their original bottle.  Medication: See above Pill/Patch Count: 1 of 120 pills remain Pill/Patch Appearance: Markings consistent with prescribed medication Bottle Appearance: Standard pharmacy container. Clearly labeled. Filled Date: 09 / 10 / 2018 Last Medication intake:  Today

## 2017-03-17 NOTE — Patient Instructions (Addendum)
____________________________________________________________________________________________  Medication Rules  Applies to: All patients receiving prescriptions (written or electronic).  Pharmacy of record: Pharmacy where electronic prescriptions will be sent. If written prescriptions are taken to a different pharmacy, please inform the nursing staff. The pharmacy listed in the electronic medical record should be the one where you would like electronic prescriptions to be sent.  Prescription refills: Only during scheduled appointments. Applies to both, written and electronic prescriptions.  NOTE: The following applies primarily to controlled substances (Opioid* Pain Medications).   Patient's responsibilities: 1. Pain Pills: Bring all pain pills to every appointment (except for procedure appointments). 2. Pill Bottles: Bring pills in original pharmacy bottle. Always bring newest bottle. Bring bottle, even if empty. 3. Medication refills: You are responsible for knowing and keeping track of what medications you need refilled. The day before your appointment, write a list of all prescriptions that need to be refilled. Bring that list to your appointment and give it to the admitting nurse. Prescriptions will be written only during appointments. If you forget a medication, it will not be "Called in", "Faxed", or "electronically sent". You will need to get another appointment to get these prescribed. 4. Prescription Accuracy: You are responsible for carefully inspecting your prescriptions before leaving our office. Have the discharge nurse carefully go over each prescription with you, before taking them home. Make sure that your name is accurately spelled, that your address is correct. Check the name and dose of your medication to make sure it is accurate. Check the number of pills, and the written instructions to make sure they are clear and accurate. Make sure that you are given enough medication to  last until your next medication refill appointment. 5. Taking Medication: Take medication as prescribed. Never take more pills than instructed. Never take medication more frequently than prescribed. Taking less pills or less frequently is permitted and encouraged, when it comes to controlled substances (written prescriptions).  6. Inform other Doctors: Always inform, all of your healthcare providers, of all the medications you take. 7. Pain Medication from other Providers: You are not allowed to accept any additional pain medication from any other Doctor or Healthcare provider. There are two exceptions to this rule. (see below) In the event that you require additional pain medication, you are responsible for notifying us, as stated below. 8. Medication Agreement: You are responsible for carefully reading and following our Medication Agreement. This must be signed before receiving any prescriptions from our practice. Safely store a copy of your signed Agreement. Violations to the Agreement will result in no further prescriptions. (Additional copies of our Medication Agreement are available upon request.) 9. Laws, Rules, & Regulations: All patients are expected to follow all Federal and State Laws, Statutes, Rules, & Regulations. Ignorance of the Laws does not constitute a valid excuse. The use of any illegal substances is prohibited. 10. Adopted CDC guidelines & recommendations: Target dosing levels will be at or below 60 MME/day. Use of benzodiazepines** is not recommended.  Exceptions: There are only two exceptions to the rule of not receiving pain medications from other Healthcare Providers. 1. Exception #1 (Emergencies): In the event of an emergency (i.e.: accident requiring emergency care), you are allowed to receive additional pain medication. However, you are responsible for: As soon as you are able, call our office [phone], at any time of the day or night, and leave a message stating your  name, the date and nature of the emergency, and the name and dose of the medication   prescribed. In the event that your call is answered by a member of our staff, make sure to document and save the date, time, and the name of the person that took your information.  2. Exception #2 (Planned Surgery): In the event that you are scheduled by another doctor or dentist to have any type of surgery or procedure, you are allowed (for a period no longer than 30 days), to receive additional pain medication, for the acute post-op pain. However, in this case, you are responsible for picking up a copy of our "Post-op Pain Management for Surgeons" handout, and giving it to your surgeon or dentist. This document is available at our office, and does not require an appointment to obtain it. Simply go to our office during business hours (Monday-Thursday from 8:00 AM to 4:00 PM) (Friday 8:00 AM to 12:00 Noon) or if you have a scheduled appointment with [place], prior to your surgery, and ask for it by name. In addition, you will need to provide [place] with your name, name of your surgeon, type of surgery, and date of procedure or surgery.  *Opioid medications include: morphine, codeine, oxycodone, oxymorphone, hydrocodone, hydromorphone, meperidine, tramadol, tapentadol, buprenorphine, fentanyl, methadone. **Benzodiazepine medications include: diazepam (Valium), alprazolam (Xanax), clonazepam (Klonopine), lorazepam (Ativan), clorazepate (Tranxene), chlordiazepoxide (Librium), estazolam (Prosom), oxazepam (Serax), temazepam (Restoril), triazolam (Halcion)  ____________________________________________________________________________________________  ____________________________________________________________________________________________  Appointment Policy Summary  It is our goal and responsibility to provide the medical community with assistance in the evaluation and management of patients with chronic pain. Unfortunately our  resources are limited. Because we do not have an unlimited amount of time, or available appointments, we are required to closely monitor and manage their use. The following rules exist to maximize their use:  Patient's responsibilities: 1. Punctuality:  At what time should I arrive? You should be physically present in our office 30 minutes before your scheduled appointment. Your scheduled appointment is with your assigned healthcare provider. However, it takes 5-10 minutes to be "checked-in", and another 15 minutes for the nurses to do the admission. If you arrive to our office at the time you were given for your appointment, you will end up being at least 20-25 minutes late to your appointment with the provider. 2. Tardiness:  What happens if I arrive only a few minutes after my scheduled appointment time? You will need to reschedule your appointment. The cutoff is your appointment time. This is why it is so important that you arrive at least 30 minutes before that appointment. If you have an appointment scheduled for 10:00 AM and you arrive at 10:01, you will be required to reschedule your appointment.  3. Plan ahead:  Always assume that you will encounter traffic on your way in. Plan for it. If you are dependent on a driver, make sure they understand these rules and the need to arrive early. 4. Other appointments and responsibilities:  Avoid scheduling any other appointments before or after your pain clinic appointments.  5. Be prepared:  Write down everything that you need to discuss with your healthcare provider and give this information to the admitting nurse. Write down the medications that you will need refilled. Bring your pills and bottles (even the empty ones), to all of your appointments, except for those where a procedure is scheduled. 6. No children or pets:  Find someone to take care of them. It is not appropriate to bring them in. 7. Scheduling changes:  We request "advanced  notification" of any changes or cancellations. 8. Advanced  notification:  Defined as a time period of more than 24 hours prior to the originally scheduled appointment. This allows for the appointment to be offered to other patients. 9. Rescheduling:  When a visit is rescheduled, it will require the cancellation of the original appointment. For this reason they both fall within the category of "Cancellations".  10. Cancellations:  They require advanced notification. Any cancellation less than 24 hours before the  appointment will be recorded as a "No Show". 11. No Show:  Defined as an unkept appointment where the patient failed to notify or declare to the practice their intention or inability to keep the appointment.  Corrective process for repeat offenders:  1. Tardiness: Three (3) episodes of rescheduling due to late arrivals will be recorded as one (1) "No Show". 2. Cancellation or reschedule: Three (3) cancellations or rescheduling will be recorded as one (1) "No Show". 3. "No Shows": Three (3) "No Shows" within a 12 month period will result in discharge from the practice.  ____________________________________________________________________________________________  BMI Assessment: Estimated body mass index is 29.05 kg/m as calculated from the following:   Height as of this encounter: 5\' 6"  (1.676 m).   Weight as of this encounter: 180 lb (81.6 kg).  BMI interpretation table: BMI level Category Range association with higher incidence of chronic pain  <18 kg/m2 Underweight   18.5-24.9 kg/m2 Ideal body weight   25-29.9 kg/m2 Overweight Increased incidence by 20%  30-34.9 kg/m2 Obese (Class I) Increased incidence by 68%  35-39.9 kg/m2 Severe obesity (Class II) Increased incidence by 136%  >40 kg/m2 Extreme obesity (Class III) Increased incidence by 254%   BMI Readings from Last 4 Encounters:  03/17/17 29.05 kg/m  02/17/17 29.05 kg/m  01/30/17 29.05 kg/m  01/14/17 29.86 kg/m    Wt Readings from Last 4 Encounters:  03/17/17 180 lb (81.6 kg)  02/17/17 180 lb (81.6 kg)  01/30/17 180 lb (81.6 kg)  01/14/17 185 lb (83.9 kg)   GENERAL RISKS AND COMPLICATIONS  What are the risk, side effects and possible complications? Generally speaking, most procedures are safe.  However, with any procedure there are risks, side effects, and the possibility of complications.  The risks and complications are dependent upon the sites that are lesioned, or the type of nerve block to be performed.  The closer the procedure is to the spine, the more serious the risks are.  Great care is taken when placing the radio frequency needles, block needles or lesioning probes, but sometimes complications can occur. 1. Infection: Any time there is an injection through the skin, there is a risk of infection.  This is why sterile conditions are used for these blocks.  There are four possible types of infection. 1. Localized skin infection. 2. Central Nervous System Infection-This can be in the form of Meningitis, which can be deadly. 3. Epidural Infections-This can be in the form of an epidural abscess, which can cause pressure inside of the spine, causing compression of the spinal cord with subsequent paralysis. This would require an emergency surgery to decompress, and there are no guarantees that the patient would recover from the paralysis. 4. Discitis-This is an infection of the intervertebral discs.  It occurs in about 1% of discography procedures.  It is difficult to treat and it may lead to surgery.        2. Pain: the needles have to go through skin and soft tissues, will cause soreness.       3. Damage to internal structures:  The nerves to be lesioned may be near blood vessels or    other nerves which can be potentially damaged.       4. Bleeding: Bleeding is more common if the patient is taking blood thinners such as  aspirin, Coumadin, Ticiid, Plavix, etc., or if he/she have some genetic  predisposition  such as hemophilia. Bleeding into the spinal canal can cause compression of the spinal  cord with subsequent paralysis.  This would require an emergency surgery to  decompress and there are no guarantees that the patient would recover from the  paralysis.       5. Pneumothorax:  Puncturing of a lung is a possibility, every time a needle is introduced in  the area of the chest or upper back.  Pneumothorax refers to free air around the  collapsed lung(s), inside of the thoracic cavity (chest cavity).  Another two possible  complications related to a similar event would include: Hemothorax and Chylothorax.   These are variations of the Pneumothorax, where instead of air around the collapsed  lung(s), you may have blood or chyle, respectively.       6. Spinal headaches: They may occur with any procedures in the area of the spine.       7. Persistent CSF (Cerebro-Spinal Fluid) leakage: This is a rare problem, but may occur  with prolonged intrathecal or epidural catheters either due to the formation of a fistulous  track or a dural tear.       8. Nerve damage: By working so close to the spinal cord, there is always a possibility of  nerve damage, which could be as serious as a permanent spinal cord injury with  paralysis.       9. Death:  Although rare, severe deadly allergic reactions known as "Anaphylactic  reaction" can occur to any of the medications used.      10. Worsening of the symptoms:  We can always make thing worse.  What are the chances of something like this happening? Chances of any of this occuring are extremely low.  By statistics, you have more of a chance of getting killed in a motor vehicle accident: while driving to the hospital than any of the above occurring .  Nevertheless, you should be aware that they are possibilities.  In general, it is similar to taking a shower.  Everybody knows that you can slip, hit your head and get killed.  Does that mean that you should not  shower again?  Nevertheless always keep in mind that statistics do not mean anything if you happen to be on the wrong side of them.  Even if a procedure has a 1 (one) in a 1,000,000 (million) chance of going wrong, it you happen to be that one..Also, keep in mind that by statistics, you have more of a chance of having something go wrong when taking medications.  Who should not have this procedure? If you are on a blood thinning medication (e.g. Coumadin, Plavix, see list of "Blood Thinners"), or if you have an active infection going on, you should not have the procedure.  If you are taking any blood thinners, please inform your physician.  How should I prepare for this procedure?  Do not eat or drink anything at least six hours prior to the procedure.  Bring a driver with you .  It cannot be a taxi.  Come accompanied by an adult that can drive you back, and that is strong enough to help  you if your legs get weak or numb from the local anesthetic.  Take all of your medicines the morning of the procedure with just enough water to swallow them.  If you have diabetes, make sure that you are scheduled to have your procedure done first thing in the morning, whenever possible.  If you have diabetes, take only half of your insulin dose and notify our nurse that you have done so as soon as you arrive at the clinic.  If you are diabetic, but only take blood sugar pills (oral hypoglycemic), then do not take them on the morning of your procedure.  You may take them after you have had the procedure.  Do not take aspirin or any aspirin-containing medications, at least eleven (11) days prior to the procedure.  They may prolong bleeding.  Wear loose fitting clothing that may be easy to take off and that you would not mind if it got stained with Betadine or blood.  Do not wear any jewelry or perfume  Remove any nail coloring.  It will interfere with some of our monitoring equipment.  NOTE: Remember that  this is not meant to be interpreted as a complete list of all possible complications.  Unforeseen problems may occur.  BLOOD THINNERS The following drugs contain aspirin or other products, which can cause increased bleeding during surgery and should not be taken for 2 weeks prior to and 1 week after surgery.  If you should need take something for relief of minor pain, you may take acetaminophen which is found in Tylenol,m Datril, Anacin-3 and Panadol. It is not blood thinner. The products listed below are.  Do not take any of the products listed below in addition to any listed on your instruction sheet.  A.P.C or A.P.C with Codeine Codeine Phosphate Capsules #3 Ibuprofen Ridaura  ABC compound Congesprin Imuran rimadil  Advil Cope Indocin Robaxisal  Alka-Seltzer Effervescent Pain Reliever and Antacid Coricidin or Coricidin-D  Indomethacin Rufen  Alka-Seltzer plus Cold Medicine Cosprin Ketoprofen S-A-C Tablets  Anacin Analgesic Tablets or Capsules Coumadin Korlgesic Salflex  Anacin Extra Strength Analgesic tablets or capsules CP-2 Tablets Lanoril Salicylate  Anaprox Cuprimine Capsules Levenox Salocol  Anexsia-D Dalteparin Magan Salsalate  Anodynos Darvon compound Magnesium Salicylate Sine-off  Ansaid Dasin Capsules Magsal Sodium Salicylate  Anturane Depen Capsules Marnal Soma  APF Arthritis pain formula Dewitt's Pills Measurin Stanback  Argesic Dia-Gesic Meclofenamic Sulfinpyrazone  Arthritis Bayer Timed Release Aspirin Diclofenac Meclomen Sulindac  Arthritis pain formula Anacin Dicumarol Medipren Supac  Analgesic (Safety coated) Arthralgen Diffunasal Mefanamic Suprofen  Arthritis Strength Bufferin Dihydrocodeine Mepro Compound Suprol  Arthropan liquid Dopirydamole Methcarbomol with Aspirin Synalgos  ASA tablets/Enseals Disalcid Micrainin Tagament  Ascriptin Doan's Midol Talwin  Ascriptin A/D Dolene Mobidin Tanderil  Ascriptin Extra Strength Dolobid Moblgesic Ticlid  Ascriptin with Codeine  Doloprin or Doloprin with Codeine Momentum Tolectin  Asperbuf Duoprin Mono-gesic Trendar  Aspergum Duradyne Motrin or Motrin IB Triminicin  Aspirin plain, buffered or enteric coated Durasal Myochrisine Trigesic  Aspirin Suppositories Easprin Nalfon Trillsate  Aspirin with Codeine Ecotrin Regular or Extra Strength Naprosyn Uracel  Atromid-S Efficin Naproxen Ursinus  Auranofin Capsules Elmiron Neocylate Vanquish  Axotal Emagrin Norgesic Verin  Azathioprine Empirin or Empirin with Codeine Normiflo Vitamin E  Azolid Emprazil Nuprin Voltaren  Bayer Aspirin plain, buffered or children's or timed BC Tablets or powders Encaprin Orgaran Warfarin Sodium  Buff-a-Comp Enoxaparin Orudis Zorpin  Buff-a-Comp with Codeine Equegesic Os-Cal-Gesic   Buffaprin Excedrin plain, buffered or Extra Strength Oxalid  Bufferin Arthritis Strength Feldene Oxphenbutazone   Bufferin plain or Extra Strength Feldene Capsules Oxycodone with Aspirin   Bufferin with Codeine Fenoprofen Fenoprofen Pabalate or Pabalate-SF   Buffets II Flogesic Panagesic   Buffinol plain or Extra Strength Florinal or Florinal with Codeine Panwarfarin   Buf-Tabs Flurbiprofen Penicillamine   Butalbital Compound Four-way cold tablets Penicillin   Butazolidin Fragmin Pepto-Bismol   Carbenicillin Geminisyn Percodan   Carna Arthritis Reliever Geopen Persantine   Carprofen Gold's salt Persistin   Chloramphenicol Goody's Phenylbutazone   Chloromycetin Haltrain Piroxlcam   Clmetidine heparin Plaquenil   Cllnoril Hyco-pap Ponstel   Clofibrate Hydroxy chloroquine Propoxyphen         Before stopping any of these medications, be sure to consult the physician who ordered them.  Some, such as Coumadin (Warfarin) are ordered to prevent or treat serious conditions such as "deep thrombosis", "pumonary embolisms", and other heart problems.  The amount of time that you may need off of the medication may also vary with the medication and the reason for which  you were taking it.  If you are taking any of these medications, please make sure you notify your pain physician before you undergo any procedures.         Radiofrequency Lesioning Radiofrequency lesioning is a procedure that is performed to relieve pain. The procedure is often used for back, neck, or arm pain. Radiofrequency lesioning involves the use of a machine that creates radio waves to make heat. During the procedure, the heat is applied to the nerve that carries the pain signal. The heat damages the nerve and interferes with the pain signal. Pain relief usually starts about 2 weeks after the procedure and lasts for 6 months to 1 year. Tell a health care provider about:  Any allergies you have.  All medicines you are taking, including vitamins, herbs, eye drops, creams, and over-the-counter medicines.  Any problems you or family members have had with anesthetic medicines.  Any blood disorders you have.  Any surgeries you have had.  Any medical conditions you have.  Whether you are pregnant or may be pregnant. What are the risks? Generally, this is a safe procedure. However, problems may occur, including:  Pain or soreness at the injection site.  Infection at the injection site.  Damage to nerves or blood vessels.  What happens before the procedure?  Ask your health care provider about: ? Changing or stopping your regular medicines. This is especially important if you are taking diabetes medicines or blood thinners. ? Taking medicines such as aspirin and ibuprofen. These medicines can thin your blood. Do not take these medicines before your procedure if your health care provider instructs you not to.  Follow instructions from your health care provider about eating or drinking restrictions.  Plan to have someone take you home after the procedure.  If you go home right after the procedure, plan to have someone with you for 24 hours. What happens during the  procedure?  You will be given one or more of the following: ? A medicine to help you relax (sedative). ? A medicine to numb the area (local anesthetic).  You will be awake during the procedure. You will need to be able to talk with the health care provider during the procedure.  With the help of a type of X-Zappone (fluoroscopy), the health care provider will insert a radiofrequency needle into the area to be treated.  Next, a wire that carries the radio waves (electrode) will be put  through the radiofrequency needle. An electrical pulse will be sent through the electrode to verify the correct nerve. You will feel a tingling sensation, and you may have muscle twitching.  Then, the tissue that is around the needle tip will be heated by an electric current that is passed using the radiofrequency machine. This will numb the nerves.  A bandage (dressing) will be put on the insertion area after the procedure is done. The procedure may vary among health care providers and hospitals. What happens after the procedure?  Your blood pressure, heart rate, breathing rate, and blood oxygen level will be monitored often until the medicines you were given have worn off.  Return to your normal activities as directed by your health care provider. This information is not intended to replace advice given to you by your health care provider. Make sure you discuss any questions you have with your health care provider. Document Released: 01/23/2011 Document Revised: 11/02/2015 Document Reviewed: 07/04/2014 [employer]vier Interactive Patient Education  [employer].

## 2017-04-14 ENCOUNTER — Encounter: Payer: Self-pay | Admitting: Nurse Practitioner

## 2017-04-14 ENCOUNTER — Other Ambulatory Visit: Payer: Self-pay | Admitting: Nurse Practitioner

## 2017-04-14 ENCOUNTER — Ambulatory Visit: Payer: Managed Care, Other (non HMO) | Attending: Nurse Practitioner | Admitting: Nurse Practitioner

## 2017-04-14 VITALS — BP 136/76 | HR 90 | Temp 98.2°F | Resp 16 | Ht 66.0 in | Wt 185.0 lb

## 2017-04-14 DIAGNOSIS — M545 Low back pain: Secondary | ICD-10-CM | POA: Diagnosis present

## 2017-04-14 DIAGNOSIS — M5442 Lumbago with sciatica, left side: Secondary | ICD-10-CM

## 2017-04-14 DIAGNOSIS — M533 Sacrococcygeal disorders, not elsewhere classified: Secondary | ICD-10-CM | POA: Diagnosis not present

## 2017-04-14 DIAGNOSIS — G8929 Other chronic pain: Secondary | ICD-10-CM | POA: Diagnosis not present

## 2017-04-14 DIAGNOSIS — M25552 Pain in left hip: Secondary | ICD-10-CM

## 2017-04-14 DIAGNOSIS — F419 Anxiety disorder, unspecified: Secondary | ICD-10-CM | POA: Insufficient documentation

## 2017-04-14 DIAGNOSIS — M7918 Myalgia, other site: Secondary | ICD-10-CM | POA: Diagnosis not present

## 2017-04-14 DIAGNOSIS — Z79891 Long term (current) use of opiate analgesic: Secondary | ICD-10-CM | POA: Insufficient documentation

## 2017-04-14 DIAGNOSIS — M47817 Spondylosis without myelopathy or radiculopathy, lumbosacral region: Secondary | ICD-10-CM | POA: Insufficient documentation

## 2017-04-14 DIAGNOSIS — F4321 Adjustment disorder with depressed mood: Secondary | ICD-10-CM | POA: Insufficient documentation

## 2017-04-14 DIAGNOSIS — M792 Neuralgia and neuritis, unspecified: Secondary | ICD-10-CM

## 2017-04-14 DIAGNOSIS — E559 Vitamin D deficiency, unspecified: Secondary | ICD-10-CM | POA: Insufficient documentation

## 2017-04-14 DIAGNOSIS — M48061 Spinal stenosis, lumbar region without neurogenic claudication: Secondary | ICD-10-CM | POA: Diagnosis not present

## 2017-04-14 DIAGNOSIS — G894 Chronic pain syndrome: Secondary | ICD-10-CM | POA: Diagnosis not present

## 2017-04-14 MED ORDER — MELOXICAM 15 MG PO TABS: 15.0000 mg | ORAL_TABLET | Freq: Every day | ORAL | 0 refills | Status: DC

## 2017-04-14 MED ORDER — OXYCODONE HCL 10 MG PO TABS: 10.0000 mg | ORAL_TABLET | Freq: Four times a day (QID) | ORAL | 0 refills | Status: DC | PRN

## 2017-04-14 MED ORDER — CYCLOBENZAPRINE HCL 10 MG PO TABS: 10.0000 mg | ORAL_TABLET | Freq: Every day | ORAL | 0 refills | Status: DC

## 2017-04-14 MED ORDER — OXYCODONE-ACETAMINOPHEN 10-325 MG PO TABS: 1.0000 | ORAL_TABLET | Freq: Four times a day (QID) | ORAL | 0 refills | Status: DC | PRN

## 2017-04-14 MED ORDER — GABAPENTIN 300 MG PO CAPS: 300.0000 mg | ORAL_CAPSULE | Freq: Three times a day (TID) | ORAL | 0 refills | Status: DC

## 2017-04-14 NOTE — Patient Instructions (Addendum)
____________________________________________________________________________________________  Medication Rules  Applies to: All patients receiving prescriptions (written or electronic).  Pharmacy of record: Pharmacy where electronic prescriptions will be sent. If written prescriptions are taken to a different pharmacy, please inform the nursing staff. The pharmacy listed in the electronic medical record should be the one where you would like electronic prescriptions to be sent.  Prescription refills: Only during scheduled appointments. Applies to both, written and electronic prescriptions.  NOTE: The following applies primarily to controlled substances (Opioid* Pain Medications).   Patient's responsibilities: 1. Pain Pills: Bring all pain pills to every appointment (except for procedure appointments). 2. Pill Bottles: Bring pills in original pharmacy bottle. Always bring newest bottle. Bring bottle, even if empty. 3. Medication refills: You are responsible for knowing and keeping track of what medications you need refilled. The day before your appointment, write a list of all prescriptions that need to be refilled. Bring that list to your appointment and give it to the admitting nurse. Prescriptions will be written only during appointments. If you forget a medication, it will not be "Called in", "Faxed", or "electronically sent". You will need to get another appointment to get these prescribed. 4. Prescription Accuracy: You are responsible for carefully inspecting your prescriptions before leaving our office. Have the discharge nurse carefully go over each prescription with you, before taking them home. Make sure that your name is accurately spelled, that your address is correct. Check the name and dose of your medication to make sure it is accurate. Check the number of pills, and the written instructions to make sure they are clear and accurate. Make sure that you are given enough medication to  last until your next medication refill appointment. 5. Taking Medication: Take medication as prescribed. Never take more pills than instructed. Never take medication more frequently than prescribed. Taking less pills or less frequently is permitted and encouraged, when it comes to controlled substances (written prescriptions).  6. Inform other Doctors: Always inform, all of your healthcare providers, of all the medications you take. 7. Pain Medication from other Providers: You are not allowed to accept any additional pain medication from any other Doctor or Healthcare provider. There are two exceptions to this rule. (see below) In the event that you require additional pain medication, you are responsible for notifying us, as stated below. 8. Medication Agreement: You are responsible for carefully reading and following our Medication Agreement. This must be signed before receiving any prescriptions from our practice. Safely store a copy of your signed Agreement. Violations to the Agreement will result in no further prescriptions. (Additional copies of our Medication Agreement are available upon request.) 9. Laws, Rules, & Regulations: All patients are expected to follow all Federal and State Laws, Statutes, Rules, & Regulations. Ignorance of the Laws does not constitute a valid excuse. The use of any illegal substances is prohibited. 10. Adopted CDC guidelines & recommendations: Target dosing levels will be at or below 60 MME/day. Use of benzodiazepines** is not recommended.  Exceptions: There are only two exceptions to the rule of not receiving pain medications from other Healthcare Providers. 1. Exception #1 (Emergencies): In the event of an emergency (i.e.: accident requiring emergency care), you are allowed to receive additional pain medication. However, you are responsible for: As soon as you are able, call our office [phone], at any time of the day or night, and leave a message stating your  name, the date and nature of the emergency, and the name and dose of the medication   prescribed. In the event that your call is answered by a member of our staff, make sure to document and save the date, time, and the name of the person that took your information.  2. Exception #2 (Planned Surgery): In the event that you are scheduled by another doctor or dentist to have any type of surgery or procedure, you are allowed (for a period no longer than 30 days), to receive additional pain medication, for the acute post-op pain. However, in this case, you are responsible for picking up a copy of our "Post-op Pain Management for Surgeons" handout, and giving it to your surgeon or dentist. This document is available at our office, and does not require an appointment to obtain it. Simply go to our office during business hours (Monday-Thursday from 8:00 AM to 4:00 PM) (Friday 8:00 AM to 12:00 Noon) or if you have a scheduled appointment with [place], prior to your surgery, and ask for it by name. In addition, you will need to provide [place] with your name, name of your surgeon, type of surgery, and date of procedure or surgery.  *Opioid medications include: morphine, codeine, oxycodone, oxymorphone, hydrocodone, hydromorphone, meperidine, tramadol, tapentadol, buprenorphine, fentanyl, methadone. **Benzodiazepine medications include: diazepam (Valium), alprazolam (Xanax), clonazepam (Klonopine), lorazepam (Ativan), clorazepate (Tranxene), chlordiazepoxide (Librium), estazolam (Prosom), oxazepam (Serax), temazepam (Restoril), triazolam (Halcion)  ____________________________________________________________________________________________  BMI Assessment: Estimated body mass index is 29.86 kg/m as calculated from the following:   Height as of this encounter: 5\' 6"  (1.676 m).   Weight as of this encounter: 185 lb (83.9 kg).  BMI interpretation table: BMI level Category Range association with higher incidence of chronic pain   <18 kg/m2 Underweight   18.5-24.9 kg/m2 Ideal body weight   25-29.9 kg/m2 Overweight Increased incidence by 20%  30-34.9 kg/m2 Obese (Class I) Increased incidence by 68%  35-39.9 kg/m2 Severe obesity (Class II) Increased incidence by 136%  >40 kg/m2 Extreme obesity (Class III) Increased incidence by 254%   BMI Readings from Last 4 Encounters:  04/14/17 29.86 kg/m  03/17/17 29.05 kg/m  02/17/17 29.05 kg/m  01/30/17 29.05 kg/m   Wt Readings from Last 4 Encounters:  04/14/17 185 lb (83.9 kg)  03/17/17 180 lb (81.6 kg)  02/17/17 180 lb (81.6 kg)  01/30/17 180 lb (81.6 kg)  Pain Management Discharge Instructions  General Discharge Instructions :  If you need to reach your doctor call: Monday-Friday 8:00 am - 4:00 pm at [phone] or toll free [phone].  After clinic hours [phone] to have operator reach doctor.  Bring all of your medication bottles to all your appointments in the pain clinic.  To cancel or reschedule your appointment with Pain Management please remember to call 24 hours in advance to avoid a fee.  Refer to the educational materials which you have been given on: General Risks, I had my Procedure. Discharge Instructions, Post Sedation.  Post Procedure Instructions:  The drugs you were given will stay in your system until tomorrow, so for the next 24 hours you should not drive, make any legal decisions or drink any alcoholic beverages.  You may eat anything you prefer, but it is better to start with liquids then soups and crackers, and gradually work up to solid foods.  Please notify your doctor immediately if you have any unusual bleeding, trouble breathing or pain that is not related to your normal pain.  Depending on the type of procedure that was done, some parts of your body may feel week and/or numb.  This usually  clears up by tonight or the next day.  Walk with the use of an assistive device or accompanied by an adult for the 24 hours.  You  may use ice on the affected area for the first 24 hours.  Put ice in a Ziploc bag and cover with a towel and place against area 15 minutes on 15 minutes off.  You may switch to heat after 24 hours.Pain Management Discharge Instructions  General Discharge Instructions :  If you need to reach your doctor call: Monday-Friday 8:00 am - 4:00 pm at [phone] or toll free [phone].  After clinic hours [phone] to have operator reach doctor.  Bring all of your medication bottles to all your appointments in the pain clinic.  To cancel or reschedule your appointment with Pain Management please remember to call 24 hours in advance to avoid a fee.  Refer to the educational materials which you have been given on: General Risks, I had my Procedure. Discharge Instructions, Post Sedation.  Post Procedure Instructions:  The drugs you were given will stay in your system until tomorrow, so for the next 24 hours you should not drive, make any legal decisions or drink any alcoholic beverages.  You may eat anything you prefer, but it is better to start with liquids then soups and crackers, and gradually work up to solid foods.  Please notify your doctor immediately if you have any unusual bleeding, trouble breathing or pain that is not related to your normal pain.  Depending on the type of procedure that was done, some parts of your body may feel week and/or numb.  This usually clears up by tonight or the next day.  Walk with the use of an assistive device or accompanied by an adult for the 24 hours.  You may use ice on the affected area for the first 24 hours.  Put ice in a Ziploc bag and cover with a towel and place against area 15 minutes on 15 minutes off.  You may switch to heat after 24 hours.Pain Management Discharge Instructions  General Discharge Instructions :  If you need to reach your doctor call: Monday-Friday 8:00 am - 4:00 pm at [phone] or toll free [phone].  After  clinic hours [phone] to have operator reach doctor.  Bring all of your medication bottles to all your appointments in the pain clinic.  To cancel or reschedule your appointment with Pain Management please remember to call 24 hours in advance to avoid a fee.  Refer to the educational materials which you have been given on: General Risks, I had my Procedure. Discharge Instructions, Post Sedation.  Post Procedure Instructions:  The drugs you were given will stay in your system until tomorrow, so for the next 24 hours you should not drive, make any legal decisions or drink any alcoholic beverages.  You may eat anything you prefer, but it is better to start with liquids then soups and crackers, and gradually work up to solid foods.  Please notify your doctor immediately if you have any unusual bleeding, trouble breathing or pain that is not related to your normal pain.  Depending on the type of procedure that was done, some parts of your body may feel week and/or numb.  This usually clears up by tonight or the next day.  Walk with the use of an assistive device or accompanied by an adult for the 24 hours.  You may use ice on the affected area for the first 24 hours.  Put  ice in a Ziploc bag and cover with a towel and place against area 15 minutes on 15 minutes off.  You may switch to heat after 24 hours.

## 2017-04-14 NOTE — Progress Notes (Signed)
Nursing Pain Medication Assessment:  Safety precautions to be maintained throughout the outpatient stay will include: orient to surroundings, keep bed in low position, maintain call bell within reach at all times, provide assistance with transfer out of bed and ambulation.  Medication Inspection Compliance: Pill count conducted under aseptic conditions, in front of the patient. Neither the pills nor the bottle was removed from the patient's sight at any time. Once count was completed pills were immediately returned to the patient in their original bottle.  Medication: Oxycodone IR Pill/Patch Count: 3 of 120 pills remain Pill/Patch Appearance: Markings consistent with prescribed medication Bottle Appearance: Standard pharmacy container. Clearly labeled. Filled Date: 10 / 10 / 2018 Last Medication intake:  Today

## 2017-04-14 NOTE — Progress Notes (Signed)
Patient's Name: [name]  MRN: [id number]  Referring Provider: Arnetha Courser, MD  DOB: [birth year]  PCP: Arnetha Courser, MD  DOS: 04/14/2017  Note by: Vevelyn Francois NP  Service setting: Ambulatory outpatient  Specialty: Interventional Pain Management  Location: [name] (AMB) Pain Management Facility    Patient type: Established    Primary Reason(s) for Visit: Encounter for prescription drug management. (Level of risk: moderate)  CC: Back Pain (center lower back)  HPI  [name] is a 46 y.o. year old, female patient, who comes today for a medication management evaluation. She has Adjustment disorder with depressed mood; Stressful life events affecting family and household; Preventative health care; Vitamin D deficiency; Lumbosacral spondylosis without myelopathy; Lumbar central spinal stenosis (L4-5); Breast cancer screening; Bladder irritation; Chronic low back pain (Location of Primary Source of Pain) (midline); Arthralgia of multiple joints; Chronic, continuous use of opioids; Elevated rheumatoid factor; Chronic pain syndrome; Long term (current) use of opiate analgesic; Long term prescription opiate use; Opiate use (85.5 MME/Day); Chronic hip pain (Location of Secondary source of pain) (Left); Failed back surgical syndrome; Discogenic low back pain; Lumbar discogenic pain syndrome; Chronic sacroiliac joint pain (Left); Musculoskeletal pain; Lumbar facet syndrome (Bilateral); Lumbar spondylosis; Neurogenic pain; and Depression on their problem list. Her primarily concern today is the Back Pain (center lower back)  Pain Assessment: Location: Mid, Lower Back Radiating: stays in center lower back Onset: More than a month ago Duration: Chronic pain Quality: Aching, Constant("a constant throw up pain") Severity: 3 /10 (self-reported pain score)  Note: Reported level is compatible with observation.                          Effect on ADL: pace self Timing: Constant Modifying factors: epsom baths,  medicine, heating pad, ice packs  [name] was last scheduled for an appointment on 03/17/2017 for medication management. During today's appointment we reviewed [name]'s chronic pain status, as well as her outpatient medication regimen. She states that the pain is coming every 3 hours. She states that she was told that it was arthritis. She does not feel like the Oxycodone is effective. She needs a patch that will be constant. She admits that she is not asking for a Lidocaine patch either. She does not feel like the Gabapentin is not effective. She did increase this to 557m per day but she has gotten immune to this.   The patient  reports that she does not use drugs. Her body mass index is 29.86 kg/m.  Further details on both, my assessment(s), as well as the proposed treatment plan, please see below.  Controlled Substance Pharmacotherapy Assessment REMS (Risk Evaluation and Mitigation Strategy)  Analgesic:Oxycodone IR 10 mg every 6 hours (448mdayof oxycodone) (60MME/day) MME/day:[id number].   PowLona Crane  04/14/2017 11:33 AM  Sign at close encounter Nursing Pain Medication Assessment:  Safety precautions to be maintained throughout the outpatient stay will include: orient to surroundings, keep bed in low position, maintain call bell within reach at all times, provide assistance with transfer out of bed and ambulation.  Medication Inspection Compliance: Pill count conducted under aseptic conditions, in front of the patient. Neither the pills nor the bottle was removed from the patient's sight at any time. Once count was completed pills were immediately returned to the patient in their original bottle.  Medication: Oxycodone IR Pill/Patch Count: 3 of 120 pills remain Pill/Patch Appearance: Markings consistent with prescribed medication Bottle  Appearance: Standard pharmacy container. Clearly labeled. Filled Date: 10 / 10 / 2018 Last Medication intake:  Today    Pharmacokinetics: Liberation and absorption (onset of action): WNL Distribution (time to peak effect): WNL Metabolism and excretion (duration of action): WNL         Pharmacodynamics: Desired effects: Analgesia: [name] reports >50% benefit. Functional ability: Patient reports that medication allows her to accomplish basic ADLs Clinically meaningful improvement in function (CMIF): Sustained CMIF goals met Perceived effectiveness: Described as relatively effective, allowing for increase in activities of daily living (ADL) Undesirable effects: Side-effects or Adverse reactions: None reported Monitoring: [place] PMP: Online review of the past 69-monthperiod conducted. Compliant with practice rules and regulations Last UDS on record: Summary  Date Value Ref Range Status  12/05/2016 FINAL  Final    Comment:    ==================================================================== TOXASSURE COMP DRUG ANALYSIS,UR ==================================================================== Test                             Result       Flag       Units Drug Present and Declared for Prescription Verification   Oxycodone                      386          EXPECTED   ng/mg creat   Oxymorphone                    4143         EXPECTED   ng/mg creat   Noroxycodone                   4130         EXPECTED   ng/mg creat   Noroxymorphone                 646          EXPECTED   ng/mg creat    Sources of oxycodone are scheduled prescription medications.    Oxymorphone, noroxycodone, and noroxymorphone are expected    metabolites of oxycodone. Oxymorphone is also available as a    scheduled prescription medication.   Acetaminophen                  PRESENT      EXPECTED Drug Absent but Declared for Prescription Verification   Duloxetine                     Not Detected UNEXPECTED ==================================================================== Test                      Result    Flag   Units      Ref Range    Creatinine              207              mg/dL      >=20 ==================================================================== Declared Medications:  The flagging and interpretation on this report are based on the  following declared medications.  Unexpected results may arise from  inaccuracies in the declared medications.  **Note: The testing scope of this panel includes these medications:  Duloxetine  Oxycodone  Oxycodone (Oxycodone Acetaminophen)  **Note: The testing scope of this panel does not include small to  moderate amounts of these reported medications:  Acetaminophen (Oxycodone Acetaminophen)  **Note: The testing scope of this panel  does not include following  reported medications:  Meloxicam ==================================================================== For clinical consultation, please call [phone]. ====================================================================    UDS interpretation: Compliant          Medication Assessment Form: Reviewed. Patient indicates being compliant with therapy Treatment compliance: Compliant Risk Assessment Profile: Aberrant behavior: See prior evaluations. None observed or detected today Comorbid factors increasing risk of overdose: See prior notes. No additional risks detected today Risk of substance use disorder (SUD): Low Opioid Risk Tool - 04/14/17 1128      Family History of Substance Abuse   Alcohol  Negative    Illegal Drugs  Negative    Rx Drugs  Negative      Personal History of Substance Abuse   Alcohol  Negative    Illegal Drugs  Negative    Rx Drugs  Negative      Age   Age between 2-45 years   No      History of Preadolescent Sexual Abuse   History of Preadolescent Sexual Abuse  Negative or Female      Psychological Disease   Psychological Disease  Negative    ADD  Negative    OCD  Negative    Bipolar  Negative    Schizophrenia  Negative    Depression  Positive on medication   on medication      Total Score   Opioid Risk Tool Scoring  1    Opioid Risk Interpretation  Low Risk      ORT Scoring interpretation table:  Score <3 = Low Risk for SUD  Score between 4-7 = Moderate Risk for SUD  Score >8 = High Risk for Opioid Abuse   Risk Mitigation Strategies:  Patient Counseling: Covered Patient-Prescriber Agreement (PPA): Present and active  Notification to other healthcare providers: Done  Pharmacologic Plan: No change in therapy, at this time  Laboratory Chemistry  Inflammation Markers (CRP: Acute Phase) (ESR: Chronic Phase) Lab Results  Component Value Date   CRP 5.3 (H) 12/05/2016   ESRSEDRATE 9 12/05/2016                 Renal Function Markers Lab Results  Component Value Date   BUN 17 12/05/2016   CREATININE 0.84 12/05/2016   GFRAA 96 12/05/2016   GFRNONAA 83 12/05/2016                 Hepatic Function Markers Lab Results  Component Value Date   AST 21 12/05/2016   ALT 20 12/05/2016   ALBUMIN 4.4 12/05/2016   ALKPHOS 59 12/05/2016                 Electrolytes Lab Results  Component Value Date   NA 139 12/05/2016   K 4.6 12/05/2016   CL 103 12/05/2016   CALCIUM 9.5 12/05/2016   MG 2.1 12/05/2016                 Neuropathy Markers Lab Results  Component Value Date   VITAMINB12 495 12/05/2016                 Bone Pathology Markers Lab Results  Component Value Date   ALKPHOS 59 12/05/2016   VD25OH 27.1 (L) 09/18/2016   25OHVITD1 49 12/05/2016   25OHVITD2 <1.0 12/05/2016   25OHVITD3 49 12/05/2016   CALCIUM 9.5 12/05/2016                 Coagulation Parameters Lab Results  Component Value Date   INR 0.9  04/16/2013   LABPROT 12.4 04/16/2013   PLT 287 10/02/2016                 Cardiovascular Markers Lab Results  Component Value Date   HGB 13.3 10/02/2016   HCT 40.8 10/02/2016                 Note: Lab results reviewed.  Recent Diagnostic Imaging Results  DG C-Arm 1-60 Min-No Report Fluoroscopy was utilized by the  requesting physician.  No radiographic  interpretation.   Complexity Note: Imaging results reviewed. Results shared with Ms. Levandowski, using Layman's terms.                         Meds   Current Outpatient Medications:  .  Oxycodone HCl 10 MG TABS, Take 1 tablet (10 mg total) every 6 (six) hours as needed by mouth., Disp: 120 tablet, Rfl: 0 .  cyclobenzaprine (FLEXERIL) 10 MG tablet, Take 1 tablet (10 mg total) at bedtime by mouth., Disp: 30 tablet, Rfl: 0 .  DULoxetine (CYMBALTA) 30 MG capsule, Take 1 capsule (30 mg total) by mouth 2 (two) times daily., Disp: 60 capsule, Rfl: 0 .  gabapentin (NEURONTIN) 300 MG capsule, Take 1 capsule (300 mg total) every 8 (eight) hours by mouth., Disp: 90 capsule, Rfl: 0 .  meloxicam (MOBIC) 15 MG tablet, Take 1 tablet (15 mg total) daily by mouth., Disp: 30 tablet, Rfl: 0 .  [START ON 04/18/2017] oxyCODONE-acetaminophen (PERCOCET) 10-325 MG tablet, Take 1 tablet every 6 (six) hours as needed by mouth for pain., Disp: 120 tablet, Rfl: 0  ROS  Constitutional: Denies any fever or chills Gastrointestinal: No reported hemesis, hematochezia, vomiting, or acute GI distress Musculoskeletal: Denies any acute onset joint swelling, redness, loss of ROM, or weakness Neurological: No reported episodes of acute onset apraxia, aphasia, dysarthria, agnosia, amnesia, paralysis, loss of coordination, or loss of consciousness  Allergies  Ms. Bowmer is allergic to codeine.  PFSH  Drug: Ms. Purrington  reports that she does not use drugs. Alcohol:  reports that she does not drink alcohol. Tobacco:  reports that she has been smoking cigarettes.  She has a 20.00 pack-year smoking history. she has never used smokeless tobacco. Medical:  has a past medical history of Allergy, Anxiety, Chronic bilateral low back pain (07/22/2016), and Depression. Surgical: Ms. Krysiak  has a past surgical history that includes Abdominal hysterectomy; Tonsillectomy; Cesarean section; Spine surgery; Spine surgery  (09/09/2015); and Back surgery. Family: family history includes Arthritis in her father; Cancer in her maternal aunt; Depression in her sister and sister; Diabetes in her mother; Heart disease in her father and maternal grandfather; Hyperlipidemia in her father; Hypertension in her father and mother; Lung disease in her father.  Constitutional Exam  General appearance: Well nourished, well developed, and well hydrated. In no apparent acute distress Vitals:   04/14/17 1116  BP: 136/76  Pulse: 90  Resp: 16  Temp: 98.2 F (36.8 C)  SpO2: 100%  Weight: 185 lb (83.9 kg)  Height: _0  (1.676 m)  Psych/Mental status: Alert, oriented x 3 (person, place, & time)       Eyes: PERLA Respiratory: No evidence of acute respiratory distress  Cervical Spine Area Exam  Skin & Axial Inspection: No masses, redness, edema, swelling, or associated skin lesions Alignment: Symmetrical Functional ROM: Unrestricted ROM      Stability: No instability detected Muscle Tone/Strength: Functionally intact. No obvious neuro-muscular anomalies detected. Sensory (Neurological):  Unimpaired Palpation: No palpable anomalies              Upper Extremity (UE) Exam    Side: Right upper extremity  Side: Left upper extremity  Skin & Extremity Inspection: Skin color, temperature, and hair growth are WNL. No peripheral edema or cyanosis. No masses, redness, swelling, asymmetry, or associated skin lesions. No contractures.  Skin & Extremity Inspection: Skin color, temperature, and hair growth are WNL. No peripheral edema or cyanosis. No masses, redness, swelling, asymmetry, or associated skin lesions. No contractures.  Functional ROM: Unrestricted ROM          Functional ROM: Unrestricted ROM          Muscle Tone/Strength: Functionally intact. No obvious neuro-muscular anomalies detected.  Muscle Tone/Strength: Functionally intact. No obvious neuro-muscular anomalies detected.  Sensory (Neurological): Unimpaired           Sensory (Neurological): Unimpaired          Palpation: No palpable anomalies              Palpation: No palpable anomalies              Specialized Test(s): Deferred         Specialized Test(s): Deferred          Thoracic Spine Area Exam  Skin & Axial Inspection: No masses, redness, or swelling Alignment: Symmetrical Functional ROM: Unrestricted ROM Stability: No instability detected Muscle Tone/Strength: Functionally intact. No obvious neuro-muscular anomalies detected. Sensory (Neurological): Unimpaired Muscle strength & Tone: No palpable anomalies  Lumbar Spine Area Exam  Skin & Axial Inspection: Well healed scar from previous spine surgery detected Alignment: Symmetrical Functional ROM: Unrestricted ROM      Stability: No instability detected Muscle Tone/Strength: Functionally intact. No obvious neuro-muscular anomalies detected. Sensory (Neurological): Unimpaired Palpation: No palpable anomalies       Provocative Tests: Lumbar Hyperextension and rotation test: evaluation deferred today       Lumbar Lateral bending test: evaluation deferred today       Patrick's Maneuver: evaluation deferred today                    Gait & Posture Assessment  Ambulation: Unassisted Gait: Relatively normal for age and body habitus Posture: WNL   Lower Extremity Exam    Side: Right lower extremity  Side: Left lower extremity  Skin & Extremity Inspection: Skin color, temperature, and hair growth are WNL. No peripheral edema or cyanosis. No masses, redness, swelling, asymmetry, or associated skin lesions. No contractures.  Skin & Extremity Inspection: Skin color, temperature, and hair growth are WNL. No peripheral edema or cyanosis. No masses, redness, swelling, asymmetry, or associated skin lesions. No contractures.  Functional ROM: Unrestricted ROM          Functional ROM: Unrestricted ROM          Muscle Tone/Strength: Functionally intact. No obvious neuro-muscular anomalies detected.  Muscle  Tone/Strength: Functionally intact. No obvious neuro-muscular anomalies detected.  Sensory (Neurological): Unimpaired  Sensory (Neurological): Unimpaired  Palpation: No palpable anomalies  Palpation: No palpable anomalies   Assessment  Primary Diagnosis & Pertinent Problem List: The primary encounter diagnosis was Chronic low back pain (Location of Primary Source of Pain) (midline). Diagnoses of Chronic hip pain (Location of Secondary source of pain) (Left), Chronic sacroiliac joint pain (Left), Chronic pain syndrome, Long term (current) use of opiate analgesic, Musculoskeletal pain, and Neurogenic pain were also pertinent to this visit.  Status Diagnosis  Persistent Controlled Controlled  1. Chronic low back pain (Location of Primary Source of Pain) (midline)   2. Chronic hip pain (Location of Secondary source of pain) (Left)   3. Chronic sacroiliac joint pain (Left)   4. Chronic pain syndrome   5. Long term (current) use of opiate analgesic   6. Musculoskeletal pain   7. Neurogenic pain     Problems updated and reviewed during this visit: No problems updated. Plan of Care  Pharmacotherapy (Medications Ordered): Meds ordered this encounter  Medications  . meloxicam (MOBIC) 15 MG tablet    Sig: Take 1 tablet (15 mg total) daily by mouth.    Dispense:  30 tablet    Refill:  0    Do not place medication on "Automatic Refill". Fill one day early if pharmacy is closed on scheduled refill date.    Order Specific Question:   Supervising Provider    Answer:   Milinda Pointer [phone]  . cyclobenzaprine (FLEXERIL) 10 MG tablet    Sig: Take 1 tablet (10 mg total) at bedtime by mouth.    Dispense:  30 tablet    Refill:  0    Do not place this medication, or any other prescription from our practice, on "Automatic Refill". Patient may have prescription filled one day early if pharmacy is closed on scheduled refill date.    Order Specific Question:   Supervising Provider    Answer:    Milinda Pointer [phone]  . gabapentin (NEURONTIN) 300 MG capsule    Sig: Take 1 capsule (300 mg total) every 8 (eight) hours by mouth.    Dispense:  90 capsule    Refill:  0    Do not place this medication, or any other prescription from our practice, on "Automatic Refill". Patient may have prescription filled one day early if pharmacy is closed on scheduled refill date.    Order Specific Question:   Supervising Provider    Answer:   Milinda Pointer [phone]  . oxyCODONE-acetaminophen (PERCOCET) 10-325 MG tablet    Sig: Take 1 tablet every 6 (six) hours as needed by mouth for pain.    Dispense:  120 tablet    Refill:  0    Do not place this medication, or any other prescription from our practice, on "Automatic Refill". Patient may have prescription filled one day early if pharmacy is closed on scheduled refill date. Do not fill until: 04/18/2017 To last until:05/18/2017    Order Specific Question:   Supervising Provider    Answer:   Milinda Pointer [phone]  This SmartLink is deprecated. Use AVSMEDLIST instead to display the medication list for a patient. Medications administered today: Butch Penny. Buss had no medications administered during this visit. Lab-work, procedure(s), and/or referral(s): Orders Placed This Encounter  Procedures  . ToxASSURE Select 13 (MW), Urine   Imaging and/or referral(s): None  Interventional management options: Planned, scheduled, and/or pending:  Left lumbar facet RFAas scheduled   Considering:  Diagnostic bilateral lumbar facet block Possible bilateral lumbar facet RFA Diagnostic left sided caudal epidural steroid injection + diagnostic epidurogram Possible left-sided Racz procedure Diagnostic left L4-5 interlaminar lumbar epidural steroid injection Diagnosticleft L4-5 transforaminal epidural steroid injection Diagnostic left L4 selective nerve root block Possible left L4 nerve root ganglion RFA Possible lumbar spinal cord  stimulator trial   Palliative PRN treatment(s):  Diagnostic bilateral lumbar facet block(NO STEROIDS)    Provider-requested follow-up: Return in about 4 weeks (around 05/12/2017) for MedMgmt.  Future Appointments  Date Time Provider [place]  05/06/2017 10:30 AM Milinda Pointer, MD [name] None  05/12/2017 11:00 AM Vevelyn Francois, NP [facility] None   Primary Care Physician: Arnetha Courser, MD Location: [facility] Management Facility Note by: Vevelyn Francois NP Date: 04/14/2017; Time: 1:09 PM  Pain Score Disclaimer: We use the NRS-11 scale. This is a self-reported, subjective measurement of pain severity with only modest accuracy. It is used primarily to identify changes within a particular patient. It must be understood that outpatient pain scales are significantly less accurate that those used for research, where they can be applied under ideal controlled circumstances with minimal exposure to variables. In reality, the score is likely to be a combination of pain intensity and pain affect, where pain affect describes the degree of emotional arousal or changes in action readiness caused by the sensory experience of pain. Factors such as social and work situation, setting, emotional state, anxiety levels, expectation, and prior pain experience may influence pain perception and show large inter-individual differences that may also be affected by time variables.  Patient instructions provided during this appointment: Patient Instructions    ____________________________________________________________________________________________  Medication Rules  Applies to: All patients receiving prescriptions (written or electronic).  Pharmacy of record: Pharmacy where electronic prescriptions will be sent. If written prescriptions are taken to a different pharmacy, please inform the nursing staff. The pharmacy listed in the electronic medical record should be the one  where you would like electronic prescriptions to be sent.  Prescription refills: Only during scheduled appointments. Applies to both, written and electronic prescriptions.  NOTE: The following applies primarily to controlled substances (Opioid* Pain Medications).   Patient's responsibilities: 1. Pain Pills: Bring all pain pills to every appointment (except for procedure appointments). 2. Pill Bottles: Bring pills in original pharmacy bottle. Always bring newest bottle. Bring bottle, even if empty. 3. Medication refills: You are responsible for knowing and keeping track of what medications you need refilled. The day before your appointment, write a list of all prescriptions that need to be refilled. Bring that list to your appointment and give it to the admitting nurse. Prescriptions will be written only during appointments. If you forget a medication, it will not be "Called in", "Faxed", or "electronically sent". You will need to get another appointment to get these prescribed. 4. Prescription Accuracy: You are responsible for carefully inspecting your prescriptions before leaving our office. Have the discharge nurse carefully go over each prescription with you, before taking them home. Make sure that your name is accurately spelled, that your address is correct. Check the name and dose of your medication to make sure it is accurate. Check the number of pills, and the written instructions to make sure they are clear and accurate. Make sure that you are given enough medication to last until your next medication refill appointment. 5. Taking Medication: Take medication as prescribed. Never take more pills than instructed. Never take medication more frequently than prescribed. Taking less pills or less frequently is permitted and encouraged, when it comes to controlled substances (written prescriptions).  6. Inform other Doctors: Always inform, all of your healthcare providers, of all the medications you  take. 7. Pain Medication from other Providers: You are not allowed to accept any additional pain medication from any other Doctor or Healthcare provider. There are two exceptions to this rule. (see below) In the event that you require additional pain medication, you are responsible for notifying us, as stated below. 8. Medication Agreement: You are responsible for carefully reading and  following our Medication Agreement. This must be signed before receiving any prescriptions from our practice. Safely store a copy of your signed Agreement. Violations to the Agreement will result in no further prescriptions. (Additional copies of our Medication Agreement are available upon request.) 9. Laws, Rules, & Regulations: All patients are expected to follow all Federal and [employer], [employer], Rules, [employer]. Ignorance of the Laws does not constitute a valid excuse. The use of any illegal substances is prohibited. 10. Adopted CDC guidelines & recommendations: Target dosing levels will be at or below 60 MME/day. Use of benzodiazepines** is not recommended.  Exceptions: There are only two exceptions to the rule of not receiving pain medications from other Healthcare Providers. 1. Exception #1 (Emergencies): In the event of an emergency (i.e.: accident requiring emergency care), you are allowed to receive additional pain medication. However, you are responsible for: As soon as you are able, call our office [phone], at any time of the day or night, and leave a message stating your name, the date and nature of the emergency, and the name and dose of the medication prescribed. In the event that your call is answered by a member of our staff, make sure to document and save the date, time, and the name of the person that took your information.  2. Exception #2 (Planned Surgery): In the event that you are scheduled by another doctor or dentist to have any type of surgery or procedure, you are allowed (for a period  no longer than 30 days), to receive additional pain medication, for the acute post-op pain. However, in this case, you are responsible for picking up a copy of our "Post-op Pain Management for Surgeons" handout, and giving it to your surgeon or dentist. This document is available at our office, and does not require an appointment to obtain it. Simply go to our office during business hours (Monday-Thursday from 8:00 AM to 4:00 PM) (Friday 8:00 AM to 12:00 Noon) or if you have a scheduled appointment with [place], prior to your surgery, and ask for it by name. In addition, you will need to provide [place] with your name, name of your surgeon, type of surgery, and date of procedure or surgery.  *Opioid medications include: morphine, codeine, oxycodone, oxymorphone, hydrocodone, hydromorphone, meperidine, tramadol, tapentadol, buprenorphine, fentanyl, methadone. **Benzodiazepine medications include: diazepam (Valium), alprazolam (Xanax), clonazepam (Klonopine), lorazepam (Ativan), clorazepate (Tranxene), chlordiazepoxide (Librium), estazolam (Prosom), oxazepam (Serax), temazepam (Restoril), triazolam (Halcion)  ____________________________________________________________________________________________  BMI Assessment: Estimated body mass index is 29.86 kg/m as calculated from the following:   Height as of this encounter: _0  (1.676 m).   Weight as of this encounter: 185 lb (83.9 kg).  BMI interpretation table: BMI level Category Range association with higher incidence of chronic pain  <18 kg/m2 Underweight   18.5-24.9 kg/m2 Ideal body weight   25-29.9 kg/m2 Overweight Increased incidence by 20%  30-34.9 kg/m2 Obese (Class I) Increased incidence by 68%  35-39.9 kg/m2 Severe obesity (Class II) Increased incidence by 136%  >40 kg/m2 Extreme obesity (Class III) Increased incidence by 254%   BMI Readings from Last 4 Encounters:  04/14/17 29.86 kg/m  03/17/17 29.05 kg/m  02/17/17 29.05 kg/m  01/30/17  29.05 kg/m   Wt Readings from Last 4 Encounters:  04/14/17 185 lb (83.9 kg)  03/17/17 180 lb (81.6 kg)  02/17/17 180 lb (81.6 kg)  01/30/17 180 lb (81.6 kg)  Pain Management Discharge Instructions  General Discharge Instructions :  If you need to reach your  doctor call: Monday-Friday 8:00 am - 4:00 pm at [phone] or toll free [phone].  After clinic hours [phone] to have operator reach doctor.  Bring all of your medication bottles to all your appointments in the pain clinic.  To cancel or reschedule your appointment with Pain Management please remember to call 24 hours in advance to avoid a fee.  Refer to the educational materials which you have been given on: General Risks, I had my Procedure. Discharge Instructions, Post Sedation.  Post Procedure Instructions:  The drugs you were given will stay in your system until tomorrow, so for the next 24 hours you should not drive, make any legal decisions or drink any alcoholic beverages.  You may eat anything you prefer, but it is better to start with liquids then soups and crackers, and gradually work up to solid foods.  Please notify your doctor immediately if you have any unusual bleeding, trouble breathing or pain that is not related to your normal pain.  Depending on the type of procedure that was done, some parts of your body may feel week and/or numb.  This usually clears up by tonight or the next day.  Walk with the use of an assistive device or accompanied by an adult for the 24 hours.  You may use ice on the affected area for the first 24 hours.  Put ice in a Ziploc bag and cover with a towel and place against area 15 minutes on 15 minutes off.  You may switch to heat after 24 hours.Pain Management Discharge Instructions  General Discharge Instructions :  If you need to reach your doctor call: Monday-Friday 8:00 am - 4:00 pm at [phone] or toll free [phone].  After clinic hours [phone] to have  operator reach doctor.  Bring all of your medication bottles to all your appointments in the pain clinic.  To cancel or reschedule your appointment with Pain Management please remember to call 24 hours in advance to avoid a fee.  Refer to the educational materials which you have been given on: General Risks, I had my Procedure. Discharge Instructions, Post Sedation.  Post Procedure Instructions:  The drugs you were given will stay in your system until tomorrow, so for the next 24 hours you should not drive, make any legal decisions or drink any alcoholic beverages.  You may eat anything you prefer, but it is better to start with liquids then soups and crackers, and gradually work up to solid foods.  Please notify your doctor immediately if you have any unusual bleeding, trouble breathing or pain that is not related to your normal pain.  Depending on the type of procedure that was done, some parts of your body may feel week and/or numb.  This usually clears up by tonight or the next day.  Walk with the use of an assistive device or accompanied by an adult for the 24 hours.  You may use ice on the affected area for the first 24 hours.  Put ice in a Ziploc bag and cover with a towel and place against area 15 minutes on 15 minutes off.  You may switch to heat after 24 hours.Pain Management Discharge Instructions  General Discharge Instructions :  If you need to reach your doctor call: Monday-Friday 8:00 am - 4:00 pm at [phone] or toll free [phone].  After clinic hours [phone] to have operator reach doctor.  Bring all of your medication bottles to all your appointments in the pain clinic.  To cancel or  reschedule your appointment with Pain Management please remember to call 24 hours in advance to avoid a fee.  Refer to the educational materials which you have been given on: General Risks, I had my Procedure. Discharge Instructions, Post Sedation.  Post Procedure  Instructions:  The drugs you were given will stay in your system until tomorrow, so for the next 24 hours you should not drive, make any legal decisions or drink any alcoholic beverages.  You may eat anything you prefer, but it is better to start with liquids then soups and crackers, and gradually work up to solid foods.  Please notify your doctor immediately if you have any unusual bleeding, trouble breathing or pain that is not related to your normal pain.  Depending on the type of procedure that was done, some parts of your body may feel week and/or numb.  This usually clears up by tonight or the next day.  Walk with the use of an assistive device or accompanied by an adult for the 24 hours.  You may use ice on the affected area for the first 24 hours.  Put ice in a Ziploc bag and cover with a towel and place against area 15 minutes on 15 minutes off.  You may switch to heat after 24 hours.

## 2017-04-15 ENCOUNTER — Other Ambulatory Visit: Payer: Self-pay | Admitting: Nurse Practitioner

## 2017-04-15 DIAGNOSIS — G894 Chronic pain syndrome: Secondary | ICD-10-CM

## 2017-04-15 DIAGNOSIS — M7918 Myalgia, other site: Secondary | ICD-10-CM

## 2017-04-15 DIAGNOSIS — M5442 Lumbago with sciatica, left side: Secondary | ICD-10-CM

## 2017-04-15 DIAGNOSIS — G8929 Other chronic pain: Secondary | ICD-10-CM

## 2017-04-15 MED ORDER — GABAPENTIN 300 MG PO CAPS: 300.0000 mg | ORAL_CAPSULE | Freq: Three times a day (TID) | ORAL | 0 refills | Status: DC

## 2017-04-15 MED ORDER — CYCLOBENZAPRINE HCL 10 MG PO TABS: 10.0000 mg | ORAL_TABLET | Freq: Every day | ORAL | 0 refills | Status: DC

## 2017-04-15 MED ORDER — MELOXICAM 15 MG PO TABS: 15.0000 mg | ORAL_TABLET | Freq: Every day | ORAL | 0 refills | Status: DC

## 2017-04-18 LAB — TOXASSURE SELECT 13 (MW), URINE

## 2017-05-06 ENCOUNTER — Encounter: Payer: Self-pay | Admitting: Pain Medicine

## 2017-05-06 ENCOUNTER — Ambulatory Visit (HOSPITAL_BASED_OUTPATIENT_CLINIC_OR_DEPARTMENT_OTHER): Payer: Managed Care, Other (non HMO) | Admitting: Pain Medicine

## 2017-05-06 ENCOUNTER — Ambulatory Visit
Admission: RE | Admit: 2017-05-06 | Discharge: 2017-05-06 | Disposition: A | Discharge status: Home / Self Care | Payer: Managed Care, Other (non HMO) | Source: Ambulatory Visit | Attending: Pain Medicine | Admitting: Pain Medicine

## 2017-05-06 ENCOUNTER — Other Ambulatory Visit: Payer: Self-pay

## 2017-05-06 VITALS — BP 138/88 | HR 80 | Temp 98.2°F | Resp 15 | Ht 66.0 in | Wt 180.0 lb

## 2017-05-06 DIAGNOSIS — G8918 Other acute postprocedural pain: Secondary | ICD-10-CM | POA: Diagnosis not present

## 2017-05-06 DIAGNOSIS — M5442 Lumbago with sciatica, left side: Secondary | ICD-10-CM

## 2017-05-06 DIAGNOSIS — G8929 Other chronic pain: Secondary | ICD-10-CM | POA: Insufficient documentation

## 2017-05-06 DIAGNOSIS — M533 Sacrococcygeal disorders, not elsewhere classified: Secondary | ICD-10-CM | POA: Insufficient documentation

## 2017-05-06 DIAGNOSIS — M47816 Spondylosis without myelopathy or radiculopathy, lumbar region: Secondary | ICD-10-CM | POA: Insufficient documentation

## 2017-05-06 DIAGNOSIS — M545 Low back pain: Secondary | ICD-10-CM | POA: Diagnosis present

## 2017-05-06 MED ORDER — LIDOCAINE HCL 2 % IJ SOLN
INTRAMUSCULAR | Status: AC
  Filled 2017-05-06: qty 20

## 2017-05-06 MED ORDER — FENTANYL CITRATE (PF) 100 MCG/2ML IJ SOLN
INTRAMUSCULAR | Status: AC
  Filled 2017-05-06: qty 2

## 2017-05-06 MED ORDER — TRIAMCINOLONE ACETONIDE 40 MG/ML IJ SUSP
40.0000 mg | Freq: Once | INTRAMUSCULAR | Status: AC
Start: 2017-05-06 — End: 2017-05-06
  Administered 2017-05-06: 40 mg

## 2017-05-06 MED ORDER — FENTANYL CITRATE (PF) 100 MCG/2ML IJ SOLN
25.0000 ug | INTRAMUSCULAR | Status: DC | PRN
  Administered 2017-05-06: 100 ug via INTRAVENOUS

## 2017-05-06 MED ORDER — MIDAZOLAM HCL 5 MG/5ML IJ SOLN
INTRAMUSCULAR | Status: AC
  Filled 2017-05-06: qty 5

## 2017-05-06 MED ORDER — TRIAMCINOLONE ACETONIDE 40 MG/ML IJ SUSP
INTRAMUSCULAR | Status: AC
  Filled 2017-05-06: qty 1

## 2017-05-06 MED ORDER — ROPIVACAINE HCL 2 MG/ML IJ SOLN
INTRAMUSCULAR | Status: AC
  Filled 2017-05-06: qty 10

## 2017-05-06 MED ORDER — MIDAZOLAM HCL 5 MG/5ML IJ SOLN
1.0000 mg | INTRAMUSCULAR | Status: DC | PRN
  Administered 2017-05-06: 5 mg via INTRAVENOUS

## 2017-05-06 MED ORDER — LACTATED RINGERS IV SOLN
1000.0000 mL | Freq: Once | INTRAVENOUS | Status: AC
  Administered 2017-05-06: 1000 mL via INTRAVENOUS

## 2017-05-06 MED ORDER — TRIAMCINOLONE ACETONIDE 40 MG/ML IJ SUSP
40.0000 mg | Freq: Once | INTRAMUSCULAR | Status: AC
  Administered 2017-05-06: 40 mg

## 2017-05-06 MED ORDER — ROPIVACAINE HCL 2 MG/ML IJ SOLN
9.0000 mL | Freq: Once | INTRAMUSCULAR | Status: AC
  Administered 2017-05-06: 10 mL via PERINEURAL

## 2017-05-06 MED ORDER — LIDOCAINE HCL 2 % IJ SOLN
10.0000 mL | Freq: Once | INTRAMUSCULAR | Status: AC
  Administered 2017-05-06: 400 mg

## 2017-05-06 MED ORDER — HYDROCODONE-ACETAMINOPHEN 5-325 MG PO TABS: 1.0000 | ORAL_TABLET | Freq: Three times a day (TID) | ORAL | 0 refills | Status: AC | PRN

## 2017-05-06 NOTE — Progress Notes (Signed)
Patient's Name: [name]  MRN: [id number]  Referring Provider: Vevelyn Francois, NP  DOB: [birth year]  PCP: Arnetha Courser, MD  DOS: 05/06/2017  Note by: Gaspar Cola, MD  Service setting: Ambulatory outpatient  Specialty: Interventional Pain Management  Patient type: Established  Location: [name] (AMB) Pain Management Facility  Visit type: Interventional Procedure   Primary Reason for Visit: Interventional Pain Management Treatment. CC: Back Pain (lower)  Procedure:  Anesthesia, Analgesia, Anxiolysis:  Type: Therapeutic Medial Branch Facet & Sacroiliac joint Radiofrequency Ablation Region: Lumbosacral Level: L2, L3, L4, L5, S1, S2, & S3 Medial Branch Level(s) Laterality: Left-Sided  Type: Local Anesthesia with Moderate (Conscious) Sedation Local Anesthetic: Lidocaine 1% Route: Intravenous (IV) IV Access: Secured Sedation: Meaningful verbal contact was maintained at all times during the procedure  Indication(s): Analgesia and Anxiety   Indications: 1. Chronic low back pain (Location of Primary Source of Pain) (midline)   2. Lumbar facet syndrome (Bilateral)   3. Lumbar spondylosis   4. Chronic sacroiliac joint pain (Left)   5. Acute postoperative pain   6. Lumbar facet joint syndrome   7. Spondylosis of lumbar spine   8. Chronic left sacroiliac joint pain   9. Chronic midline low back pain with left-sided sciatica    [name] has either failed to respond, was unable to tolerate, or simply did not get enough benefit from other more conservative therapies including, but not limited to: 1. Over-the-counter medications 2. Anti-inflammatory medications 3. Muscle relaxants 4. Membrane stabilizers 5. Opioids 6. Physical therapy 7. Modalities (Heat, ice, etc.) 8. Invasive techniques such as nerve blocks. [name] has attained more than 50% relief of the pain from a series of diagnostic injections conducted in separate occasions.  Pain Score: Pre-procedure: 3  /10 Post-procedure: 0-No pain/10  Pre-op Assessment:  [name] is a 46 y.o. (year old), female patient, seen today for interventional treatment. She  has a past surgical history that includes Abdominal hysterectomy; Tonsillectomy; Cesarean section; Spine surgery; Spine surgery (09/09/2015); and Back surgery. [name] has a current medication list which includes the following prescription(s): cyclobenzaprine, gabapentin, meloxicam, oxycodone-acetaminophen, duloxetine, hydrocodone-acetaminophen, and oxycodone hcl, and the following Facility-Administered Medications: fentanyl and midazolam. Her primarily concern today is the Back Pain (lower)  Initial Vital Signs: There were no vitals taken for this visit. BMI: Estimated body mass index is 29.05 kg/m as calculated from the following:   Height as of this encounter: 5\' 6"  (1.676 m).   Weight as of this encounter: 180 lb (81.6 kg).  Risk Assessment: Allergies: Reviewed. She is allergic to codeine.  Allergy Precautions: None required Coagulopathies: Reviewed. None identified.  Blood-thinner therapy: None at this time Active Infection(s): Reviewed. None identified. [name] is afebrile  Site Confirmation: [name] was asked to confirm the procedure and laterality before marking the site Procedure checklist: Completed Consent: Before the procedure and under the influence of no sedative(s), amnesic(s), or anxiolytics, the patient was informed of the treatment options, risks and possible complications. To fulfill our ethical and legal obligations, as recommended by the American Medical Association's Code of Ethics, I have informed the patient of my clinical impression; the nature and purpose of the treatment or procedure; the risks, benefits, and possible complications of the intervention; the alternatives, including doing nothing; the risk(s) and benefit(s) of the alternative treatment(s) or procedure(s); and the risk(s) and benefit(s) of doing nothing. The  patient was provided information about the general risks and possible complications associated with the procedure. These may  include, but are not limited to: failure to achieve desired goals, infection, bleeding, organ or nerve damage, allergic reactions, paralysis, and death. In addition, the patient was informed of those risks and complications associated to Spine-related procedures, such as failure to decrease pain; infection (i.e.: Meningitis, epidural or intraspinal abscess); bleeding (i.e.: epidural hematoma, subarachnoid hemorrhage, or any other type of intraspinal or peri-dural bleeding); organ or nerve damage (i.e.: Any type of peripheral nerve, nerve root, or spinal cord injury) with subsequent damage to sensory, motor, and/or autonomic systems, resulting in permanent pain, numbness, and/or weakness of one or several areas of the body; allergic reactions; (i.e.: anaphylactic reaction); and/or death. Furthermore, the patient was informed of those risks and complications associated with the medications. These include, but are not limited to: allergic reactions (i.e.: anaphylactic or anaphylactoid reaction(s)); adrenal axis suppression; blood sugar elevation that in diabetics may result in ketoacidosis or comma; water retention that in patients with history of congestive heart failure may result in shortness of breath, pulmonary edema, and decompensation with resultant heart failure; weight gain; swelling or edema; medication-induced neural toxicity; particulate matter embolism and blood vessel occlusion with resultant organ, and/or nervous system infarction; and/or aseptic necrosis of one or more joints. Finally, the patient was informed that Medicine is not an exact science; therefore, there is also the possibility of unforeseen or unpredictable risks and/or possible complications that may result in a catastrophic outcome. The patient indicated having understood very clearly. We have given the patient no  guarantees and we have made no promises. Enough time was given to the patient to ask questions, all of which were answered to the patient's satisfaction. [name] has indicated that she wanted to continue with the procedure. Attestation: I, the ordering provider, attest that I have discussed with the patient the benefits, risks, side-effects, alternatives, likelihood of achieving goals, and potential problems during recovery for the procedure that I have provided informed consent. Date: 05/06/2017; Time: 7:31 AM  Pre-Procedure Preparation:  Monitoring: As per clinic protocol. Respiration, ETCO2, SpO2, BP, heart rate and rhythm monitor placed and checked for adequate function Safety Precautions: Patient was assessed for positional comfort and pressure points before starting the procedure. Time-out: I initiated and conducted the "Time-out" before starting the procedure, as per protocol. The patient was asked to participate by confirming the accuracy of the "Time Out" information. Verification of the correct person, site, and procedure were performed and confirmed by me, the nursing staff, and the patient. "Time-out" conducted as per Joint Commission's Universal Protocol (UP.01.01.01). "Time-out" Date & Time: 05/06/2017; 1129 hrs.  Description of Procedure Process:   Target Area: For Lumbar Facet blocks, the target is the groove formed by the junction of the transverse process and superior articular process. For the L5 dorsal ramus, the target is the notch between superior articular process and sacral ala. For the Sacral dorsal rami, the target is the superior and lateral edge of the posterior S1, S2, & S3 Sacral foramen. Approach: Paraspinal approach. Area Prepped: Entire Posterior Lumbosacral Region Prepping solution: Hibiclens (4.0% Chlorhexidine gluconate solution) Safety Precautions: Aspiration looking for blood return was conducted prior to all injections. At no point did we inject any substances,  as a needle was being advanced. No attempts were made at seeking any paresthesias. Safe injection practices and needle disposal techniques used. Medications properly checked for expiration dates. SDV (single dose vial) medications used. Description of the Procedure: Protocol guidelines were followed. The patient was placed in position over the procedure table. The  target area was identified and the area prepped in the usual manner. The skin and muscle were infiltrated with local anesthetic. Appropriate amount of time allowed to pass for local anesthetics to take effect. Radiofrequency needles were introduced to the target area using fluoroscopic guidance. Using the NeuroTherm NT1100 Radiofrequency Generator, sensory stimulation using 50 Hz was used to locate & identify the nerve, making sure that the needle was positioned such that there was no sensory stimulation below 0.3 V or above 0.7 V. Stimulation using 2 Hz was used to evaluate the motor component. Care was taken not to lesion any nerves that demonstrated motor stimulation of the lower extremities at an output of less than 2.5 times that of the sensory threshold, or a maximum of 2.0 V. Once satisfactory placement of the needles was achieved, the numbing solution was slowly injected after negative aspiration. After waiting for at least 2 minutes, the ablation was performed at 80 degrees C for 60 seconds, using regular Radiofrequency settings. Once the procedure was completed, the needles were then removed and the area cleansed, making sure to leave some of the prepping solution back to take advantage of its long term bactericidal properties. Intra-operative Compliance: Compliant Vitals:   05/06/17 1214 05/06/17 1224 05/06/17 1235 05/06/17 1244  BP: 123/85 129/71 126/87 138/88  Pulse:      Resp: 12 13 15 15   Temp:      TempSrc:      SpO2: 98% 95% 95% 95%  Weight:      Height:        Start Time: 1130 hrs. End Time: 1214 hrs. Materials &  Medications:  Needle(s) Type: Teflon-coated, curved tip, Radiofrequency needle(s) Gauge: 22G Length: 10cm Medication(s): We administered lactated ringers, midazolam, fentaNYL, lidocaine, triamcinolone acetonide, ropivacaine (PF) 2 mg/mL (0.2%), triamcinolone acetonide, and ropivacaine (PF) 2 mg/mL (0.2%). Please see chart orders for dosing details.  Imaging Guidance (Spinal):  Type of Imaging Technique: Fluoroscopy Guidance (Spinal) Indication(s): Assistance in needle guidance and placement for procedures requiring needle placement in or near specific anatomical locations not easily accessible without such assistance. Exposure Time: Please see nurses notes. Contrast: None used. Fluoroscopic Guidance: I was personally present during the use of fluoroscopy. "Tunnel Vision Technique" used to obtain the best possible view of the target area. Parallax error corrected before commencing the procedure. "Direction-depth-direction" technique used to introduce the needle under continuous pulsed fluoroscopy. Once target was reached, antero-posterior, oblique, and lateral fluoroscopic projection used confirm needle placement in all planes. Images permanently stored in EMR. Interpretation: No contrast injected. I personally interpreted the imaging intraoperatively. Adequate needle placement confirmed in multiple planes. Permanent images saved into the patient's record.  Antibiotic Prophylaxis:  Indication(s): None identified Antibiotic given: None  Post-operative Assessment:  EBL: None Complications: No immediate post-treatment complications observed by team, or reported by patient. Note: The patient tolerated the entire procedure well. A repeat set of vitals were taken after the procedure and the patient was kept under observation following institutional policy, for this type of procedure. Post-procedural neurological assessment was performed, showing return to baseline, prior to discharge. The patient was  provided with post-procedure discharge instructions, including a section on how to identify potential problems. Should any problems arise concerning this procedure, the patient was given instructions to immediately contact us, at any time, without hesitation. In any case, we plan to contact the patient by telephone for a follow-up status report regarding this interventional procedure. Comments:  No additional relevant information.  Plan of Care  Imaging Orders     DG C-Arm 1-60 Min-No Report  Procedure Orders     Radiofrequency,Lumbar     Radiofrequency Sacroiliac Joint     Radiofrequency,Lumbar  Medications ordered for procedure: Meds ordered this encounter  Medications  . lactated ringers infusion 1,000 mL  . midazolam (VERSED) 5 MG/5ML injection 1-2 mg    Make sure Flumazenil is available in the pyxis when using this medication. If oversedation occurs, administer 0.2 mg IV over 15 sec. If after 45 sec no response, administer 0.2 mg again over 1 min; may repeat at 1 min intervals; not to exceed 4 doses (1 mg)  . fentaNYL (SUBLIMAZE) injection 25-50 mcg    Make sure Narcan is available in the pyxis when using this medication. In the event of respiratory depression (RR< 8/min): Titrate NARCAN (naloxone) in increments of 0.1 to 0.2 mg IV at 2-3 minute intervals, until desired degree of reversal.  . lidocaine (XYLOCAINE) 2 % (with pres) injection 200 mg  . triamcinolone acetonide (KENALOG-40) injection 40 mg  . ropivacaine (PF) 2 mg/mL (0.2%) (NAROPIN) injection 9 mL  . triamcinolone acetonide (KENALOG-40) injection 40 mg  . ropivacaine (PF) 2 mg/mL (0.2%) (NAROPIN) injection 9 mL  . HYDROcodone-acetaminophen (NORCO/VICODIN) 5-325 MG tablet    Sig: Take 1 tablet by mouth every 8 (eight) hours as needed for up to 7 days for severe pain.    Dispense:  21 tablet    Refill:  0    For acute post-operative pain. Not to be refilled. To last 7 days.   Medications administered: We  administered lactated ringers, midazolam, fentaNYL, lidocaine, triamcinolone acetonide, ropivacaine (PF) 2 mg/mL (0.2%), triamcinolone acetonide, and ropivacaine (PF) 2 mg/mL (0.2%).  See the medical record for exact dosing, route, and time of administration.  This SmartLink is deprecated. Use AVSMEDLIST instead to display the medication list for a patient. Disposition: Discharge home  Discharge Date & Time: 05/06/2017; 1245 hrs.   Physician-requested Follow-up: Return for contralateral RFA in about 2-weeks: (R) L-FCT RFA. Future Appointments  Date Time Provider [place]  05/12/2017 11:00 AM Vevelyn Francois, NP [facility] None   Primary Care Physician: Arnetha Courser, MD Location: [facility] Out[facility] Management [facility] Note by: Gaspar Cola, MD Date: 05/06/2017; Time: 2:37 PM  Disclaimer:  Medicine is not an exact science. The only guarantee in medicine is that nothing is guaranteed. It is important to note that the decision to proceed with this intervention was based on the information collected from the patient. The Data and conclusions were drawn from the patient's questionnaire, the interview, and the physical examination. Because the information was provided in large part by the patient, it cannot be guaranteed that it has not been purposely or unconsciously manipulated. Every effort has been made to obtain as much relevant data as possible for this evaluation. It is important to note that the conclusions that lead to this procedure are derived in large part from the available data. Always take into account that the treatment will also be dependent on availability of resources and existing treatment guidelines, considered by other Pain Management Practitioners as being common knowledge and practice, at the time of the intervention. For Medico-Legal purposes, it is also important to point out that variation in procedural techniques and pharmacological choices are the acceptable  norm. The indications, contraindications, technique, and results of the above procedure should only be interpreted and judged by a Board-Certified Interventional Pain Specialist with extensive familiarity and expertise in the same  exact procedure and technique.

## 2017-05-06 NOTE — Patient Instructions (Addendum)
____________________________________________________________________________________________  Post-Procedure instructions Instructions:  Apply ice: Fill a plastic sandwich bag with crushed ice. Cover it with a small towel and apply to injection site. Apply for 15 minutes then remove x 15 minutes. Repeat sequence on day of procedure, until you go to bed. The purpose is to minimize swelling and discomfort after procedure.  Apply heat: Apply heat to procedure site starting the day following the procedure. The purpose is to treat any soreness and discomfort from the procedure.  Food intake: Start with clear liquids (like water) and advance to regular food, as tolerated.   Physical activities: Keep activities to a minimum for the first 8 hours after the procedure.   Driving: If you have received any sedation, you are not allowed to drive for 24 hours after your procedure.  Blood thinner: Restart your blood thinner 6 hours after your procedure. (Only for those taking blood thinners)  Insulin: As soon as you can eat, you may resume your normal dosing schedule. (Only for those taking insulin)  Infection prevention: Keep procedure site clean and dry.  Post-procedure Pain Diary: Extremely important that this be done correctly and accurately. Recorded information will be used to determine the next step in treatment.  Pain evaluated is that of treated area only. Do not include pain from an untreated area.  Complete every hour, on the hour, for the initial 8 hours. Set an alarm to help you do this part accurately.  Do not go to sleep and have it completed later. It will not be accurate.  Follow-up appointment: Keep your follow-up appointment after the procedure. Usually 2 weeks for most procedures. (6 weeks in the case of radiofrequency.) Bring you pain diary.  Expect:  From numbing medicine (AKA: Local Anesthetics): Numbness or decrease in pain.  Onset: Full effect within 15 minutes of  injected.  Duration: It will depend on the type of local anesthetic used. On the average, 1 to 8 hours.   From steroids: Decrease in swelling or inflammation. Once inflammation is improved, relief of the pain will follow.  Onset of benefits: Depends on the amount of swelling present. The more swelling, the longer it will take for the benefits to be seen. In some cases, up to 10 days.  Duration: Steroids will stay in the system x 2 weeks. Duration of benefits will depend on multiple posibilities including persistent irritating factors.  From procedure: Some discomfort is to be expected once the numbing medicine wears off. This should be minimal if ice and heat are applied as instructed. Call if:  You experience numbness and weakness that gets worse with time, as opposed to wearing off.  New onset bowel or bladder incontinence. (Spinal procedures only)  Emergency Numbers:  Durning business hours (Monday - Thursday, 8:00 AM - 4:00 PM) (Friday, 9:00 AM - 12:00 Noon): [phone]  After hours: [phone] ____________________________________________________________________________________________   ____________________________________________________________________________________________  Preparing for Procedure with Sedation Instructions: . Oral Intake: Do not eat or drink anything for at least 8 hours prior to your procedure. . Transportation: Public transportation is not allowed. Bring an adult driver. The driver must be physically present in our waiting room before any procedure can be started. [facility] Kitchen Physical Assistance: Bring an adult physically capable of assisting you, in the event you need help. This adult should keep you company at home for at least 6 hours after the procedure. . Blood Pressure Medicine: Take your blood pressure medicine with a sip of water the morning of the procedure. Marland Kitchen  Blood thinners:  . Diabetics on insulin: Notify the staff so that you can be scheduled  1st case in the morning. If your diabetes requires high dose insulin, take only  of your normal insulin dose the morning of the procedure and notify the staff that you have done so. . Preventing infections: Shower with an antibacterial soap the morning of your procedure. . Build-up your immune system: Take 1000 mg of Vitamin C with every meal (3 times a day) the day prior to your procedure. Marland Kitchen Antibiotics: Inform the staff if you have a condition or reason that requires you to take antibiotics before dental procedures. . Pregnancy: If you are pregnant, call and cancel the procedure. . Sickness: If you have a cold, fever, or any active infections, call and cancel the procedure. . Arrival: You must be in the facility at least 30 minutes prior to your scheduled procedure. . Children: Do not bring children with you. . Dress appropriately: Bring dark clothing that you would not mind if they get stained. . Valuables: Do not bring any jewelry or valuables. Procedure appointments are reserved for interventional treatments only. Marland Kitchen No Prescription Refills. . No medication changes will be discussed during procedure appointments. . No disability issues will be discussed. ____________________________________________________________________________________________   Pain Management Discharge Instructions  General Discharge Instructions :  If you need to reach your doctor call: Monday-Friday 8:00 am - 4:00 pm at [phone] or toll free [phone].  After clinic hours [phone] to have operator reach doctor.  Bring all of your medication bottles to all your appointments in the pain clinic.  To cancel or reschedule your appointment with Pain Management please remember to call 24 hours in advance to avoid a fee.  Refer to the educational materials which you have been given on: General Risks, I had my Procedure. Discharge Instructions, Post Sedation.  Post Procedure Instructions:  The drugs you  were given will stay in your system until tomorrow, so for the next 24 hours you should not drive, make any legal decisions or drink any alcoholic beverages.  You may eat anything you prefer, but it is better to start with liquids then soups and crackers, and gradually work up to solid foods.  Please notify your doctor immediately if you have any unusual bleeding, trouble breathing or pain that is not related to your normal pain.  Depending on the type of procedure that was done, some parts of your body may feel week and/or numb.  This usually clears up by tonight or the next day.  Walk with the use of an assistive device or accompanied by an adult for the 24 hours.  You may use ice on the affected area for the first 24 hours.  Put ice in a Ziploc bag and cover with a towel and place against area 15 minutes on 15 minutes off.  You may switch to heat after 24 hours.Radiofrequency Lesioning Radiofrequency lesioning is a procedure that is performed to relieve pain. The procedure is often used for back, neck, or arm pain. Radiofrequency lesioning involves the use of a machine that creates radio waves to make heat. During the procedure, the heat is applied to the nerve that carries the pain signal. The heat damages the nerve and interferes with the pain signal. Pain relief usually starts about 2 weeks after the procedure and lasts for 6 months to 1 year. Tell a health care provider about:  Any allergies you have.  All medicines you are taking, including vitamins, herbs,  eye drops, creams, and over-the-counter medicines.  Any problems you or family members have had with anesthetic medicines.  Any blood disorders you have.  Any surgeries you have had.  Any medical conditions you have.  Whether you are pregnant or may be pregnant. What are the risks? Generally, this is a safe procedure. However, problems may occur, including:  Pain or soreness at the injection site.  Infection at the  injection site.  Damage to nerves or blood vessels.  What happens before the procedure?  Ask your health care provider about: ? Changing or stopping your regular medicines. This is especially important if you are taking diabetes medicines or blood thinners. ? Taking medicines such as aspirin and ibuprofen. These medicines can thin your blood. Do not take these medicines before your procedure if your health care provider instructs you not to.  Follow instructions from your health care provider about eating or drinking restrictions.  Plan to have someone take you home after the procedure.  If you go home right after the procedure, plan to have someone with you for 24 hours. What happens during the procedure?  You will be given one or more of the following: ? A medicine to help you relax (sedative). ? A medicine to numb the area (local anesthetic).  You will be awake during the procedure. You will need to be able to talk with the health care provider during the procedure.  With the help of a type of X-Goltz (fluoroscopy), the health care provider will insert a radiofrequency needle into the area to be treated.  Next, a wire that carries the radio waves (electrode) will be put through the radiofrequency needle. An electrical pulse will be sent through the electrode to verify the correct nerve. You will feel a tingling sensation, and you may have muscle twitching.  Then, the tissue that is around the needle tip will be heated by an electric current that is passed using the radiofrequency machine. This will numb the nerves.  A bandage (dressing) will be put on the insertion area after the procedure is done. The procedure may vary among health care providers and hospitals. What happens after the procedure?  Your blood pressure, heart rate, breathing rate, and blood oxygen level will be monitored often until the medicines you were given have worn off.  Return to your normal activities as  directed by your health care provider. This information is not intended to replace advice given to you by your health care provider. Make sure you discuss any questions you have with your health care provider. Document Released: 01/23/2011 Document Revised: 11/02/2015 Document Reviewed: 07/04/2014 Elsevier Interactive Patient Education  [employer].

## 2017-05-07 ENCOUNTER — Telehealth: Payer: Self-pay | Admitting: *Deleted

## 2017-05-07 NOTE — Telephone Encounter (Signed)
Denies complications post procedure. 

## 2017-05-12 ENCOUNTER — Other Ambulatory Visit: Payer: Self-pay

## 2017-05-12 ENCOUNTER — Ambulatory Visit: Payer: Managed Care, Other (non HMO) | Attending: Nurse Practitioner | Admitting: Nurse Practitioner

## 2017-05-12 ENCOUNTER — Encounter: Payer: Self-pay | Admitting: Nurse Practitioner

## 2017-05-12 VITALS — BP 135/84 | HR 81 | Resp 16 | Ht 66.0 in | Wt 180.0 lb

## 2017-05-12 DIAGNOSIS — G8929 Other chronic pain: Secondary | ICD-10-CM | POA: Diagnosis not present

## 2017-05-12 DIAGNOSIS — M546 Pain in thoracic spine: Secondary | ICD-10-CM | POA: Diagnosis not present

## 2017-05-12 DIAGNOSIS — Z885 Allergy status to narcotic agent status: Secondary | ICD-10-CM | POA: Insufficient documentation

## 2017-05-12 DIAGNOSIS — M533 Sacrococcygeal disorders, not elsewhere classified: Secondary | ICD-10-CM | POA: Insufficient documentation

## 2017-05-12 DIAGNOSIS — G8918 Other acute postprocedural pain: Secondary | ICD-10-CM | POA: Diagnosis not present

## 2017-05-12 DIAGNOSIS — Z79899 Other long term (current) drug therapy: Secondary | ICD-10-CM | POA: Insufficient documentation

## 2017-05-12 DIAGNOSIS — Z79891 Long term (current) use of opiate analgesic: Secondary | ICD-10-CM | POA: Diagnosis not present

## 2017-05-12 DIAGNOSIS — F4321 Adjustment disorder with depressed mood: Secondary | ICD-10-CM | POA: Diagnosis not present

## 2017-05-12 DIAGNOSIS — M47896 Other spondylosis, lumbar region: Secondary | ICD-10-CM | POA: Diagnosis not present

## 2017-05-12 DIAGNOSIS — F17210 Nicotine dependence, cigarettes, uncomplicated: Secondary | ICD-10-CM | POA: Insufficient documentation

## 2017-05-12 DIAGNOSIS — M545 Low back pain: Secondary | ICD-10-CM | POA: Diagnosis not present

## 2017-05-12 DIAGNOSIS — M7918 Myalgia, other site: Secondary | ICD-10-CM | POA: Diagnosis not present

## 2017-05-12 DIAGNOSIS — E559 Vitamin D deficiency, unspecified: Secondary | ICD-10-CM | POA: Insufficient documentation

## 2017-05-12 DIAGNOSIS — M5442 Lumbago with sciatica, left side: Secondary | ICD-10-CM | POA: Diagnosis not present

## 2017-05-12 DIAGNOSIS — M48061 Spinal stenosis, lumbar region without neurogenic claudication: Secondary | ICD-10-CM | POA: Insufficient documentation

## 2017-05-12 DIAGNOSIS — F419 Anxiety disorder, unspecified: Secondary | ICD-10-CM | POA: Insufficient documentation

## 2017-05-12 DIAGNOSIS — M25552 Pain in left hip: Secondary | ICD-10-CM | POA: Diagnosis not present

## 2017-05-12 DIAGNOSIS — Z5181 Encounter for therapeutic drug level monitoring: Secondary | ICD-10-CM | POA: Diagnosis not present

## 2017-05-12 DIAGNOSIS — G894 Chronic pain syndrome: Secondary | ICD-10-CM | POA: Diagnosis not present

## 2017-05-12 DIAGNOSIS — M47817 Spondylosis without myelopathy or radiculopathy, lumbosacral region: Secondary | ICD-10-CM | POA: Diagnosis not present

## 2017-05-12 MED ORDER — CYCLOBENZAPRINE HCL 10 MG PO TABS: 10.0000 mg | ORAL_TABLET | Freq: Every day | ORAL | 0 refills | Status: DC

## 2017-05-12 MED ORDER — GABAPENTIN 300 MG PO CAPS: 300.0000 mg | ORAL_CAPSULE | Freq: Three times a day (TID) | ORAL | 0 refills | Status: DC

## 2017-05-12 MED ORDER — OXYCODONE-ACETAMINOPHEN 10-325 MG PO TABS: 1.0000 | ORAL_TABLET | Freq: Four times a day (QID) | ORAL | 0 refills | Status: DC | PRN

## 2017-05-12 MED ORDER — MELOXICAM 15 MG PO TABS: 15.0000 mg | ORAL_TABLET | Freq: Every day | ORAL | 0 refills | Status: DC

## 2017-05-12 MED ORDER — DULOXETINE HCL 30 MG PO CPEP: 30.0000 mg | ORAL_CAPSULE | Freq: Two times a day (BID) | ORAL | 0 refills | Status: DC

## 2017-05-12 NOTE — Progress Notes (Signed)
Nursing Pain Medication Assessment:  Safety precautions to be maintained throughout the outpatient stay will include: orient to surroundings, keep bed in low position, maintain call bell within reach at all times, provide assistance with transfer out of bed and ambulation.  Medication Inspection Compliance: Pill count conducted under aseptic conditions, in front of the patient. Neither the pills nor the bottle was removed from the patient's sight at any time. Once count was completed pills were immediately returned to the patient in their original bottle.  Medication #1: Hydrocodone/APAP Pill/Patch Count: 8 of 21 pills remain Pill/Patch Appearance: Markings consistent with prescribed medication Bottle Appearance: Standard pharmacy container. Clearly labeled. Filled Date: 56 / 29 / 2018 Last Medication intake:  Today  Medication #2: Oxycodone/APAP Pill/Patch Count: 0 of 120 pills remain Pill/Patch Appearance: Markings consistent with prescribed medication Bottle Appearance: Standard pharmacy container. Clearly labeled. Filled Date: 49 / 9 / 2018 Last Medication intake:  Yesterday

## 2017-05-12 NOTE — Progress Notes (Addendum)
Patient's Name: [name]  MRN: [id number]  Referring Provider: Arnetha Courser, MD  DOB: 1971/05/14  PCP: Arnetha Courser, MD  DOS: 05/12/2017  Note by: Vevelyn Francois NP  Service setting: Ambulatory outpatient  Specialty: Interventional Pain Management  Location: [name] (AMB) Pain Management Facility    Patient type: Established    Primary Reason(s) for Visit: Encounter for prescription drug management. (Level of risk: moderate)  CC: Back Pain (mid, lower)  HPI  [name] is a 46 y.o. year old, female patient, who comes today for a medication management evaluation. She has Adjustment disorder with depressed mood; Stressful life events affecting family and household; Preventative health care; Vitamin D deficiency; Lumbosacral spondylosis without myelopathy; Lumbar central spinal stenosis (L4-5); Breast cancer screening; Bladder irritation; Chronic low back pain (Location of Primary Source of Pain) (midline); Arthralgia of multiple joints; Chronic, continuous use of opioids; Elevated rheumatoid factor; Chronic pain syndrome; Long term (current) use of opiate analgesic; Long term prescription opiate use; Opiate use (85.5 MME/Day); Chronic hip pain (Location of Secondary source of pain) (Left); Failed back surgical syndrome; Discogenic low back pain; Lumbar discogenic pain syndrome; Chronic sacroiliac joint pain (Left); Musculoskeletal pain; Lumbar facet syndrome (Bilateral); Lumbar spondylosis; Neurogenic pain; Depression; and Acute postoperative pain on their problem list. Her primarily concern today is the Back Pain (mid, lower)  Pain Assessment: Location: Lower Back Radiating: no Onset: More than a month ago Duration: Chronic pain Quality: Aching, Dull Severity: 3 /10 (self-reported pain score)  Note: Reported level is compatible with observation.                          Effect on ADL: "Messed my world up" Timing: Constant Modifying factors: heat, hcol, rest, sitting , walking some  [name]  was last scheduled for an appointment on 04/14/2017 for medication management. During today's appointment we reviewed [name]'s chronic pain status, as well as her outpatient medication regimen. She admits that she is doing well with lumbar RFA. She states that hse is ready to get the other side completed. She admits that there is still some pain but she feels like there is improvement. She denies any concerns to day. She denies any constipation  The patient  reports that she does not use drugs. Her body mass index is 29.05 kg/m.  Further details on both, my assessment(s), as well as the proposed treatment plan, please see below.  Controlled Substance Pharmacotherapy Assessment REMS (Risk Evaluation and Mitigation Strategy)  Analgesic:Oxycodone/acetaminophen IR 10/325 mg every 6 hours (27m/dayof oxycodone) (60MME/day) MME/day:619mday.     GaIgnatius SpeckingRN  05/12/2017 11:21 AM  Sign at close encounter Nursing Pain Medication Assessment:  Safety precautions to be maintained throughout the outpatient stay will include: orient to surroundings, keep bed in low position, maintain call bell within reach at all times, provide assistance with transfer out of bed and ambulation.  Medication Inspection Compliance: Pill count conducted under aseptic conditions, in front of the patient. Neither the pills nor the bottle was removed from the patient's sight at any time. Once count was completed pills were immediately returned to the patient in their original bottle.  Medication #1: Hydrocodone/APAP Pill/Patch Count: 8 of 21 pills remain Pill/Patch Appearance: Markings consistent with prescribed medication Bottle Appearance: Standard pharmacy container. Clearly labeled. Filled Date: 1175 29 / 2018 Last Medication intake:  Today  Medication #2: Oxycodone/APAP Pill/Patch Count: 0 of 120 pills remain Pill/Patch Appearance: Markings  consistent with prescribed medication Bottle Appearance: Standard  pharmacy container. Clearly labeled. Filled Date: 72 / 89 / 2018 Last Medication intake:  Yesterday   Pharmacokinetics: Liberation and absorption (onset of action): WNL Distribution (time to peak effect): WNL Metabolism and excretion (duration of action): WNL         Pharmacodynamics: Desired effects: Analgesia: [name] reports >50% benefit. Functional ability: Patient reports that medication allows her to accomplish basic ADLs Clinically meaningful improvement in function (CMIF): Sustained CMIF goals met Perceived effectiveness: Described as relatively effective, allowing for increase in activities of daily living (ADL) Undesirable effects: Side-effects or Adverse reactions: None reported Monitoring: [place] PMP: Online review of the past 42-monthperiod conducted. Compliant with practice rules and regulations Last UDS on record: Summary  Date Value Ref Range Status  04/14/2017 FINAL  Final    Comment:    ==================================================================== TOXASSURE SELECT 13 (MW) ==================================================================== Test                             Result       Flag       Units Drug Present and Declared for Prescription Verification   Oxycodone                      645          EXPECTED   ng/mg creat   Oxymorphone                    1345         EXPECTED   ng/mg creat   Noroxycodone                   3037         EXPECTED   ng/mg creat   Noroxymorphone                 425          EXPECTED   ng/mg creat    Sources of oxycodone are scheduled prescription medications.    Oxymorphone, noroxycodone, and noroxymorphone are expected    metabolites of oxycodone. Oxymorphone is also available as a    scheduled prescription medication. ==================================================================== Test                      Result    Flag   Units      Ref Range   Creatinine              137              mg/dL       >=20 ==================================================================== Declared Medications:  The flagging and interpretation on this report are based on the  following declared medications.  Unexpected results may arise from  inaccuracies in the declared medications.  **Note: The testing scope of this panel includes these medications:  Oxycodone  **Note: The testing scope of this panel does not include following  reported medications:  Cyclobenzaprine (Flexeril)  Duloxetine (Cymbalta)  Gabapentin  Meloxicam (Mobic) ==================================================================== For clinical consultation, please call [phone] ====================================================================    UDS interpretation: Compliant          Medication Assessment Form: Reviewed. Patient indicates being compliant with therapy Treatment compliance: Non-compliant Risk Assessment Profile: Aberrant behavior: See prior evaluations. None observed or detected today Comorbid factors increasing risk of overdose: See prior notes. No additional risks detected today Risk of substance  use disorder (SUD): Low Opioid Risk Tool - 05/12/17 1117      Family History of Substance Abuse   Alcohol  Negative    Illegal Drugs  Negative    Rx Drugs  Negative      Personal History of Substance Abuse   Alcohol  Negative    Illegal Drugs  Negative    Rx Drugs  Negative      Age   Age between 65-45 years   No      History of Preadolescent Sexual Abuse   History of Preadolescent Sexual Abuse  Negative or Female      Psychological Disease   Psychological Disease  Negative    ADD  Negative    OCD  Negative    Bipolar  Negative    Schizophrenia  Negative    Depression  Negative      Total Score   Opioid Risk Tool Scoring  0    Opioid Risk Interpretation  Low Risk      ORT Scoring interpretation table:  Score <3 = Low Risk for SUD  Score between 4-7 = Moderate Risk for SUD  Score >8 =  High Risk for Opioid Abuse   Risk Mitigation Strategies:  Patient Counseling: Covered Patient-Prescriber Agreement (PPA): Present and active  Notification to other healthcare providers: Done  Pharmacologic Plan: No change in therapy, at this time  Laboratory Chemistry  Inflammation Markers (CRP: Acute Phase) (ESR: Chronic Phase) Lab Results  Component Value Date   CRP 5.3 (H) 12/05/2016   ESRSEDRATE 9 12/05/2016                 Rheumatology Markers Lab Results  Component Value Date   RF 17.9 (H) 12/05/2016   ANA NEG 10/02/2016   LABURIC 2.5 09/18/2016                Renal Function Markers Lab Results  Component Value Date   BUN 17 12/05/2016   CREATININE 0.84 12/05/2016   GFRAA 96 12/05/2016   GFRNONAA 83 12/05/2016                 Hepatic Function Markers Lab Results  Component Value Date   AST 21 12/05/2016   ALT 20 12/05/2016   ALBUMIN 4.4 12/05/2016   ALKPHOS 59 12/05/2016   LIPASE 107 12/26/2011                 Electrolytes Lab Results  Component Value Date   NA 139 12/05/2016   K 4.6 12/05/2016   CL 103 12/05/2016   CALCIUM 9.5 12/05/2016   MG 2.1 12/05/2016   PHOS 3.0 09/18/2016                 Neuropathy Markers Lab Results  Component Value Date   VITAMINB12 495 12/05/2016                 Bone Pathology Markers Lab Results  Component Value Date   VD25OH 27.1 (L) 09/18/2016   25OHVITD1 49 12/05/2016   25OHVITD2 <1.0 12/05/2016   25OHVITD3 49 12/05/2016                 Coagulation Parameters Lab Results  Component Value Date   INR 0.9 04/16/2013   LABPROT 12.4 04/16/2013   PLT 287 10/02/2016                 Cardiovascular Markers Lab Results  Component Value Date   CKTOTAL 75 04/16/2013  CKMB 0.6 04/16/2013   TROPONINI < 0.02 04/16/2013   HGB 13.3 10/02/2016   HCT 40.8 10/02/2016                 CA Markers No results found for: CEA, CA125, LABCA2               Note: Lab results reviewed.  Recent Diagnostic Imaging  Results  DG C-Arm 1-60 Min-No Report Fluoroscopy was utilized by the requesting physician.  No radiographic  interpretation.   Complexity Note: Imaging results reviewed. Results shared with [name], using Layman's terms.                         Meds   Current Outpatient Medications:  .  cyclobenzaprine (FLEXERIL) 10 MG tablet, Take 1 tablet (10 mg total) by mouth at bedtime., Disp: 30 tablet, Rfl: 0 .  gabapentin (NEURONTIN) 300 MG capsule, Take 1 capsule (300 mg total) by mouth every 8 (eight) hours., Disp: 90 capsule, Rfl: 0 .  HYDROcodone-acetaminophen (NORCO/VICODIN) 5-325 MG tablet, Take 1 tablet by mouth every 8 (eight) hours as needed for up to 7 days for severe pain., Disp: 21 tablet, Rfl: 0 .  meloxicam (MOBIC) 15 MG tablet, Take 1 tablet (15 mg total) by mouth daily., Disp: 30 tablet, Rfl: 0 .  [START ON 05/18/2017] oxyCODONE-acetaminophen (PERCOCET) 10-325 MG tablet, Take 1 tablet by mouth every 6 (six) hours as needed for pain., Disp: 120 tablet, Rfl: 0 .  DULoxetine (CYMBALTA) 30 MG capsule, Take 1 capsule (30 mg total) by mouth 2 (two) times daily., Disp: 60 capsule, Rfl: 0  ROS  Constitutional: Denies any fever or chills Gastrointestinal: No reported hemesis, hematochezia, vomiting, or acute GI distress Musculoskeletal: Denies any acute onset joint swelling, redness, loss of ROM, or weakness Neurological: No reported episodes of acute onset apraxia, aphasia, dysarthria, agnosia, amnesia, paralysis, loss of coordination, or loss of consciousness  Allergies  [name] is allergic to codeine.  PFSH  Drug: [name]  reports that she does not use drugs. Alcohol:  reports that she does not drink alcohol. Tobacco:  reports that she has been smoking cigarettes.  She has a 20.00 pack-year smoking history. she has never used smokeless tobacco. Medical:  has a past medical history of Allergy, Anxiety, Chronic bilateral low back pain (07/22/2016), and Depression. Surgical: [name]  has  a past surgical history that includes Abdominal hysterectomy; Tonsillectomy; Cesarean section; Spine surgery; Spine surgery (09/09/2015); and Back surgery. Family: family history includes Arthritis in her father; Cancer in her maternal aunt; Depression in her sister and sister; Diabetes in her mother; Heart disease in her father and maternal grandfather; Hyperlipidemia in her father; Hypertension in her father and mother; Lung disease in her father.  Constitutional Exam  General appearance: Well nourished, well developed, and well hydrated. In no apparent acute distress Vitals:   05/12/17 1111  BP: 135/84  Pulse: 81  Resp: 16  SpO2: 100%  Weight: 180 lb (81.6 kg)  Height: 5' 6"  (1.676 m)   BMI Assessment: Estimated body mass index is 29.05 kg/m as calculated from the following:   Height as of this encounter: 5' 6"  (1.676 m).   Weight as of this encounter: 180 lb (81.6 kg). Psych/Mental status: Alert, oriented x 3 (person, place, & time)       Eyes: PERLA Respiratory: No evidence of acute respiratory distress  Cervical Spine Area Exam  Skin & Axial Inspection: No masses,  redness, edema, swelling, or associated skin lesions Alignment: Symmetrical Functional ROM: Unrestricted ROM      Stability: No instability detected Muscle Tone/Strength: Functionally intact. No obvious neuro-muscular anomalies detected. Sensory (Neurological): Unimpaired Palpation: No palpable anomalies              Upper Extremity (UE) Exam    Side: Right upper extremity  Side: Left upper extremity  Skin & Extremity Inspection: Skin color, temperature, and hair growth are WNL. No peripheral edema or cyanosis. No masses, redness, swelling, asymmetry, or associated skin lesions. No contractures.  Skin & Extremity Inspection: Skin color, temperature, and hair growth are WNL. No peripheral edema or cyanosis. No masses, redness, swelling, asymmetry, or associated skin lesions. No contractures.  Functional ROM:  Unrestricted ROM          Functional ROM: Unrestricted ROM          Muscle Tone/Strength: Functionally intact. No obvious neuro-muscular anomalies detected.  Muscle Tone/Strength: Functionally intact. No obvious neuro-muscular anomalies detected.  Sensory (Neurological): Unimpaired          Sensory (Neurological): Unimpaired          Palpation: No palpable anomalies              Palpation: No palpable anomalies              Specialized Test(s): Deferred         Specialized Test(s): Deferred          Thoracic Spine Area Exam  Skin & Axial Inspection: No masses, redness, or swelling Alignment: Symmetrical Functional ROM: Unrestricted ROM Stability: No instability detected Muscle Tone/Strength: Functionally intact. No obvious neuro-muscular anomalies detected. Sensory (Neurological): Unimpaired Muscle strength & Tone: No palpable anomalies  Lumbar Spine Area Exam  Skin & Axial Inspection: No masses, redness, or swelling Alignment: Symmetrical Functional ROM: Unrestricted ROM      Stability: No instability detected Muscle Tone/Strength: Functionally intact. No obvious neuro-muscular anomalies detected. Sensory (Neurological): Unimpaired Palpation: No palpable anomalies       Provocative Tests: Lumbar Hyperextension and rotation test: evaluation deferred today       Lumbar Lateral bending test: evaluation deferred today       Patrick's Maneuver: evaluation deferred today                    Gait & Posture Assessment  Ambulation: Unassisted Gait: Relatively normal for age and body habitus Posture: WNL   Lower Extremity Exam    Side: Right lower extremity  Side: Left lower extremity  Skin & Extremity Inspection: Skin color, temperature, and hair growth are WNL. No peripheral edema or cyanosis. No masses, redness, swelling, asymmetry, or associated skin lesions. No contractures.  Skin & Extremity Inspection: Skin color, temperature, and hair growth are WNL. No peripheral edema or cyanosis.  No masses, redness, swelling, asymmetry, or associated skin lesions. No contractures.  Functional ROM: Unrestricted ROM          Functional ROM: Unrestricted ROM          Muscle Tone/Strength: Functionally intact. No obvious neuro-muscular anomalies detected.  Muscle Tone/Strength: Functionally intact. No obvious neuro-muscular anomalies detected.  Sensory (Neurological): Unimpaired  Sensory (Neurological): Unimpaired  Palpation: No palpable anomalies  Palpation: No palpable anomalies   Assessment  Primary Diagnosis & Pertinent Problem List: The primary encounter diagnosis was Chronic low back pain (Location of Primary Source of Pain) (midline). Diagnoses of Chronic sacroiliac joint pain (Left), Chronic hip pain (Location of Secondary source of  pain) (Left), Chronic pain syndrome, and Musculoskeletal pain were also pertinent to this visit.  Status Diagnosis  Controlled Controlled Controlled 1. Chronic low back pain (Location of Primary Source of Pain) (midline)   2. Chronic sacroiliac joint pain (Left)   3. Chronic hip pain (Location of Secondary source of pain) (Left)   4. Chronic pain syndrome   5. Musculoskeletal pain     Problems updated and reviewed during this visit: No problems updated. Plan of Care  Pharmacotherapy (Medications Ordered): Meds ordered this encounter  Medications  . meloxicam (MOBIC) 15 MG tablet    Sig: Take 1 tablet (15 mg total) by mouth daily.    Dispense:  30 tablet    Refill:  0    Do not place medication on "Automatic Refill". Fill one day early if pharmacy is closed on scheduled refill date.    Order Specific Question:   Supervising Provider    Answer:   Milinda Pointer [phone]  . oxyCODONE-acetaminophen (PERCOCET) 10-325 MG tablet    Sig: Take 1 tablet by mouth every 6 (six) hours as needed for pain.    Dispense:  120 tablet    Refill:  0    Do not place this medication, or any other prescription from our practice, on "Automatic Refill". Patient  may have prescription filled one day early if pharmacy is closed on scheduled refill date. Do not fill until: 05/18/2017 To last until:06/17/2017    Order Specific Question:   Supervising Provider    Answer:   Milinda Pointer [phone]  . DULoxetine (CYMBALTA) 30 MG capsule    Sig: Take 1 capsule (30 mg total) by mouth 2 (two) times daily.    Dispense:  60 capsule    Refill:  0    Do not place medication on "Automatic Refill". Fill one day early if pharmacy is closed on scheduled refill date.    Order Specific Question:   Supervising Provider    Answer:   Milinda Pointer [phone]  . cyclobenzaprine (FLEXERIL) 10 MG tablet    Sig: Take 1 tablet (10 mg total) by mouth at bedtime.    Dispense:  30 tablet    Refill:  0    Do not place this medication, or any other prescription from our practice, on "Automatic Refill". Patient may have prescription filled one day early if pharmacy is closed on scheduled refill date.    Order Specific Question:   Supervising Provider    Answer:   Milinda Pointer [phone]  . gabapentin (NEURONTIN) 300 MG capsule    Sig: Take 1 capsule (300 mg total) by mouth every 8 (eight) hours.    Dispense:  90 capsule    Refill:  0    Do not place this medication, or any other prescription from our practice, on "Automatic Refill". Patient may have prescription filled one day early if pharmacy is closed on scheduled refill date.    Order Specific Question:   Supervising Provider    Answer:   Milinda Pointer [[id number]]  This SmartLink is deprecated. Use AVSMEDLIST instead to display the medication list for a patient. Medications administered today: Butch Penny. Crane had no medications administered during this visit. Lab-work, procedure(s), and/or referral(s): No orders of the defined types were placed in this encounter.  Imaging and/or referral(s): None  Interventional management options: Planned, scheduled, and/or pending:  Right lumbar facet RFAas scheduled    Considering:  Diagnostic bilateral lumbar facet block Possible bilateral lumbar facet RFA Diagnostic left sided caudal  epidural steroid injection + diagnostic epidurogram Possible left-sided Racz procedure Diagnostic left L4-5 interlaminar lumbar epidural steroid injection Diagnosticleft L4-5 transforaminal epidural steroid injection Diagnostic left L4 selective nerve root block Possible left L4 nerve root ganglion RFA Possible lumbar spinal cord stimulator trial   Palliative PRN treatment(s):  Diagnostic bilateral lumbar facet block(NO STEROIDS)    Provider-requested follow-up: Return for MedMgmt.  Future Appointments  Date Time Provider Scalp Level  06/26/2017  2:15 PM Milinda Pointer, MD [facility] None   Primary Care Physician: Arnetha Courser, MD Location: [facility] Note by: Vevelyn Francois NP Date: 05/12/2017; Time: 11:37 AM  Pain Score Disclaimer: We use the NRS-11 scale. This is a self-reported, subjective measurement of pain severity with only modest accuracy. It is used primarily to identify changes within a particular patient. It must be understood that outpatient pain scales are significantly less accurate that those used for research, where they can be applied under ideal controlled circumstances with minimal exposure to variables. In reality, the score is likely to be a combination of pain intensity and pain affect, where pain affect describes the degree of emotional arousal or changes in action readiness caused by the sensory experience of pain. Factors such as social and work situation, setting, emotional state, anxiety levels, expectation, and prior pain experience may influence pain perception and show large inter-individual differences that may also be affected by time variables.  Patient instructions provided during this appointment: Patient Instructions    ____________________________________________________________________________________________  Medication Rules  Applies to: All patients receiving prescriptions (written or electronic).  Pharmacy of record: Pharmacy where electronic prescriptions will be sent. If written prescriptions are taken to a different pharmacy, please inform the nursing staff. The pharmacy listed in the electronic medical record should be the one where you would like electronic prescriptions to be sent.  Prescription refills: Only during scheduled appointments. Applies to both, written and electronic prescriptions.  NOTE: The following applies primarily to controlled substances (Opioid* Pain Medications).   Patient's responsibilities: 1. Pain Pills: Bring all pain pills to every appointment (except for procedure appointments). 2. Pill Bottles: Bring pills in original pharmacy bottle. Always bring newest bottle. Bring bottle, even if empty. 3. Medication refills: You are responsible for knowing and keeping track of what medications you need refilled. The day before your appointment, write a list of all prescriptions that need to be refilled. Bring that list to your appointment and give it to the admitting nurse. Prescriptions will be written only during appointments. If you forget a medication, it will not be "Called in", "Faxed", or "electronically sent". You will need to get another appointment to get these prescribed. 4. Prescription Accuracy: You are responsible for carefully inspecting your prescriptions before leaving our office. Have the discharge nurse carefully go over each prescription with you, before taking them home. Make sure that your name is accurately spelled, that your address is correct. Check the name and dose of your medication to make sure it is accurate. Check the number of pills, and the written instructions to make sure they are clear and accurate. Make sure that you are given enough medication to  last until your next medication refill appointment. 5. Taking Medication: Take medication as prescribed. Never take more pills than instructed. Never take medication more frequently than prescribed. Taking less pills or less frequently is permitted and encouraged, when it comes to controlled substances (written prescriptions).  6. Inform other Doctors: Always inform, all of your healthcare providers, of all  the medications you take. 7. Pain Medication from other Providers: You are not allowed to accept any additional pain medication from any other Doctor or Healthcare provider. There are two exceptions to this rule. (see below) In the event that you require additional pain medication, you are responsible for notifying us, as stated below. 8. Medication Agreement: You are responsible for carefully reading and following our Medication Agreement. This must be signed before receiving any prescriptions from our practice. Safely store a copy of your signed Agreement. Violations to the Agreement will result in no further prescriptions. (Additional copies of our Medication Agreement are available upon request.) 9. Laws, Rules, & Regulations: All patients are expected to follow all Federal and [employer], [employer], Rules, [employer]. Ignorance of the Laws does not constitute a valid excuse. The use of any illegal substances is prohibited. 10. Adopted CDC guidelines & recommendations: Target dosing levels will be at or below 60 MME/day. Use of benzodiazepines** is not recommended.  Exceptions: There are only two exceptions to the rule of not receiving pain medications from other Healthcare Providers. 1. Exception #1 (Emergencies): In the event of an emergency (i.e.: accident requiring emergency care), you are allowed to receive additional pain medication. However, you are responsible for: As soon as you are able, call our office [phone], at any time of the day or night, and leave a message stating your  name, the date and nature of the emergency, and the name and dose of the medication prescribed. In the event that your call is answered by a member of our staff, make sure to document and save the date, time, and the name of the person that took your information.  2. Exception #2 (Planned Surgery): In the event that you are scheduled by another doctor or dentist to have any type of surgery or procedure, you are allowed (for a period no longer than 30 days), to receive additional pain medication, for the acute post-op pain. However, in this case, you are responsible for picking up a copy of our "Post-op Pain Management for Surgeons" handout, and giving it to your surgeon or dentist. This document is available at our office, and does not require an appointment to obtain it. Simply go to our office during business hours (Monday-Thursday from 8:00 AM to 4:00 PM) (Friday 8:00 AM to 12:00 Noon) or if you have a scheduled appointment with [place], prior to your surgery, and ask for it by name. In addition, you will need to provide [place] with your name, name of your surgeon, type of surgery, and date of procedure or surgery.  *Opioid medications include: morphine, codeine, oxycodone, oxymorphone, hydrocodone, hydromorphone, meperidine, tramadol, tapentadol, buprenorphine, fentanyl, methadone. **Benzodiazepine medications include: diazepam (Valium), alprazolam (Xanax), clonazepam (Klonopine), lorazepam (Ativan), clorazepate (Tranxene), chlordiazepoxide (Librium), estazolam (Prosom), oxazepam (Serax), temazepam (Restoril), triazolam (Halcion)  ____________________________________________________________________________________________

## 2017-05-12 NOTE — Patient Instructions (Addendum)
____________________________________________________________________________________________  Medication Rules  Applies to: All patients receiving prescriptions (written or electronic).  Pharmacy of record: Pharmacy where electronic prescriptions will be sent. If written prescriptions are taken to a different pharmacy, please inform the nursing staff. The pharmacy listed in the electronic medical record should be the one where you would like electronic prescriptions to be sent.  Prescription refills: Only during scheduled appointments. Applies to both, written and electronic prescriptions.  NOTE: The following applies primarily to controlled substances (Opioid* Pain Medications).   Patient's responsibilities: 1. Pain Pills: Bring all pain pills to every appointment (except for procedure appointments). 2. Pill Bottles: Bring pills in original pharmacy bottle. Always bring newest bottle. Bring bottle, even if empty. 3. Medication refills: You are responsible for knowing and keeping track of what medications you need refilled. The day before your appointment, write a list of all prescriptions that need to be refilled. Bring that list to your appointment and give it to the admitting nurse. Prescriptions will be written only during appointments. If you forget a medication, it will not be "Called in", "Faxed", or "electronically sent". You will need to get another appointment to get these prescribed. 4. Prescription Accuracy: You are responsible for carefully inspecting your prescriptions before leaving our office. Have the discharge nurse carefully go over each prescription with you, before taking them home. Make sure that your name is accurately spelled, that your address is correct. Check the name and dose of your medication to make sure it is accurate. Check the number of pills, and the written instructions to make sure they are clear and accurate. Make sure that you are given enough medication to  last until your next medication refill appointment. 5. Taking Medication: Take medication as prescribed. Never take more pills than instructed. Never take medication more frequently than prescribed. Taking less pills or less frequently is permitted and encouraged, when it comes to controlled substances (written prescriptions).  6. Inform other Doctors: Always inform, all of your healthcare providers, of all the medications you take. 7. Pain Medication from other Providers: You are not allowed to accept any additional pain medication from any other Doctor or Healthcare provider. There are two exceptions to this rule. (see below) In the event that you require additional pain medication, you are responsible for notifying us, as stated below. 8. Medication Agreement: You are responsible for carefully reading and following our Medication Agreement. This must be signed before receiving any prescriptions from our practice. Safely store a copy of your signed Agreement. Violations to the Agreement will result in no further prescriptions. (Additional copies of our Medication Agreement are available upon request.) 9. Laws, Rules, & Regulations: All patients are expected to follow all Federal and State Laws, Statutes, Rules, & Regulations. Ignorance of the Laws does not constitute a valid excuse. The use of any illegal substances is prohibited. 10. Adopted CDC guidelines & recommendations: Target dosing levels will be at or below 60 MME/day. Use of benzodiazepines** is not recommended.  Exceptions: There are only two exceptions to the rule of not receiving pain medications from other Healthcare Providers. 1. Exception #1 (Emergencies): In the event of an emergency (i.e.: accident requiring emergency care), you are allowed to receive additional pain medication. However, you are responsible for: As soon as you are able, call our office [phone], at any time of the day or night, and leave a message stating your  name, the date and nature of the emergency, and the name and dose of the medication   prescribed. In the event that your call is answered by a member of our staff, make sure to document and save the date, time, and the name of the person that took your information.  2. Exception #2 (Planned Surgery): In the event that you are scheduled by another doctor or dentist to have any type of surgery or procedure, you are allowed (for a period no longer than 30 days), to receive additional pain medication, for the acute post-op pain. However, in this case, you are responsible for picking up a copy of our "Post-op Pain Management for Surgeons" handout, and giving it to your surgeon or dentist. This document is available at our office, and does not require an appointment to obtain it. Simply go to our office during business hours (Monday-Thursday from 8:00 AM to 4:00 PM) (Friday 8:00 AM to 12:00 Noon) or if you have a scheduled appointment with [place], prior to your surgery, and ask for it by name. In addition, you will need to provide [place] with your name, name of your surgeon, type of surgery, and date of procedure or surgery.  *Opioid medications include: morphine, codeine, oxycodone, oxymorphone, hydrocodone, hydromorphone, meperidine, tramadol, tapentadol, buprenorphine, fentanyl, methadone. **Benzodiazepine medications include: diazepam (Valium), alprazolam (Xanax), clonazepam (Klonopine), lorazepam (Ativan), clorazepate (Tranxene), chlordiazepoxide (Librium), estazolam (Prosom), oxazepam (Serax), temazepam (Restoril), triazolam (Halcion)  You were given one prescription for Percocet today. Prescriptions for Cyclobenzaprine, Cymbalta, Gabapentin, and Mobic were sent to your pharmacy. ____________________________________________________________________________________________

## 2017-06-12 ENCOUNTER — Other Ambulatory Visit: Payer: Self-pay

## 2017-06-12 ENCOUNTER — Ambulatory Visit: Payer: Managed Care, Other (non HMO) | Attending: Nurse Practitioner | Admitting: Nurse Practitioner

## 2017-06-12 ENCOUNTER — Encounter: Payer: Self-pay | Admitting: Nurse Practitioner

## 2017-06-12 VITALS — BP 123/87 | HR 72 | Temp 98.4°F | Resp 16 | Ht 66.0 in | Wt 180.0 lb

## 2017-06-12 DIAGNOSIS — F17210 Nicotine dependence, cigarettes, uncomplicated: Secondary | ICD-10-CM | POA: Insufficient documentation

## 2017-06-12 DIAGNOSIS — M48061 Spinal stenosis, lumbar region without neurogenic claudication: Secondary | ICD-10-CM | POA: Insufficient documentation

## 2017-06-12 DIAGNOSIS — M47816 Spondylosis without myelopathy or radiculopathy, lumbar region: Secondary | ICD-10-CM

## 2017-06-12 DIAGNOSIS — F329 Major depressive disorder, single episode, unspecified: Secondary | ICD-10-CM | POA: Insufficient documentation

## 2017-06-12 DIAGNOSIS — F4321 Adjustment disorder with depressed mood: Secondary | ICD-10-CM | POA: Diagnosis not present

## 2017-06-12 DIAGNOSIS — M5442 Lumbago with sciatica, left side: Secondary | ICD-10-CM | POA: Diagnosis not present

## 2017-06-12 DIAGNOSIS — M533 Sacrococcygeal disorders, not elsewhere classified: Secondary | ICD-10-CM | POA: Insufficient documentation

## 2017-06-12 DIAGNOSIS — M47817 Spondylosis without myelopathy or radiculopathy, lumbosacral region: Secondary | ICD-10-CM | POA: Diagnosis not present

## 2017-06-12 DIAGNOSIS — Z5181 Encounter for therapeutic drug level monitoring: Secondary | ICD-10-CM | POA: Insufficient documentation

## 2017-06-12 DIAGNOSIS — G8929 Other chronic pain: Secondary | ICD-10-CM | POA: Diagnosis not present

## 2017-06-12 DIAGNOSIS — E559 Vitamin D deficiency, unspecified: Secondary | ICD-10-CM | POA: Diagnosis not present

## 2017-06-12 DIAGNOSIS — M7918 Myalgia, other site: Secondary | ICD-10-CM

## 2017-06-12 DIAGNOSIS — M549 Dorsalgia, unspecified: Secondary | ICD-10-CM | POA: Insufficient documentation

## 2017-06-12 DIAGNOSIS — Z885 Allergy status to narcotic agent status: Secondary | ICD-10-CM | POA: Diagnosis not present

## 2017-06-12 DIAGNOSIS — Z79891 Long term (current) use of opiate analgesic: Secondary | ICD-10-CM | POA: Diagnosis not present

## 2017-06-12 DIAGNOSIS — G894 Chronic pain syndrome: Secondary | ICD-10-CM | POA: Diagnosis not present

## 2017-06-12 MED ORDER — CYCLOBENZAPRINE HCL 10 MG PO TABS: 10.0000 mg | ORAL_TABLET | Freq: Every day | ORAL | 0 refills | Status: DC

## 2017-06-12 MED ORDER — GABAPENTIN 300 MG PO CAPS: 300.0000 mg | ORAL_CAPSULE | Freq: Three times a day (TID) | ORAL | 0 refills | Status: DC

## 2017-06-12 MED ORDER — OXYCODONE-ACETAMINOPHEN 10-325 MG PO TABS: 1.0000 | ORAL_TABLET | Freq: Four times a day (QID) | ORAL | 0 refills | Status: DC | PRN

## 2017-06-12 MED ORDER — MELOXICAM 15 MG PO TABS: 15.0000 mg | ORAL_TABLET | Freq: Every day | ORAL | 0 refills | Status: DC

## 2017-06-12 NOTE — Patient Instructions (Addendum)
You have gotten a script for percocet.  You have been given pre procedure instructions for RF today.  You have other prescriptions to be picked up at your pharmacy.  today____________________________________________________________________________________________  Medication Rules  Applies to: All patients receiving prescriptions (written or electronic).  Pharmacy of record: Pharmacy where electronic prescriptions will be sent. If written prescriptions are taken to a different pharmacy, please inform the nursing staff. The pharmacy listed in the electronic medical record should be the one where you would like electronic prescriptions to be sent.  Prescription refills: Only during scheduled appointments. Applies to both, written and electronic prescriptions.  NOTE: The following applies primarily to controlled substances (Opioid* Pain Medications).   Patient's responsibilities: 1. Pain Pills: Bring all pain pills to every appointment (except for procedure appointments). 2. Pill Bottles: Bring pills in original pharmacy bottle. Always bring newest bottle. Bring bottle, even if empty. 3. Medication refills: You are responsible for knowing and keeping track of what medications you need refilled. The day before your appointment, write a list of all prescriptions that need to be refilled. Bring that list to your appointment and give it to the admitting nurse. Prescriptions will be written only during appointments. If you forget a medication, it will not be "Called in", "Faxed", or "electronically sent". You will need to get another appointment to get these prescribed. 4. Prescription Accuracy: You are responsible for carefully inspecting your prescriptions before leaving our office. Have the discharge nurse carefully go over each prescription with you, before taking them home. Make sure that your name is accurately spelled, that your address is correct. Check the name and dose of your medication to make  sure it is accurate. Check the number of pills, and the written instructions to make sure they are clear and accurate. Make sure that you are given enough medication to last until your next medication refill appointment. 5. Taking Medication: Take medication as prescribed. Never take more pills than instructed. Never take medication more frequently than prescribed. Taking less pills or less frequently is permitted and encouraged, when it comes to controlled substances (written prescriptions).  6. Inform other Doctors: Always inform, all of your healthcare providers, of all the medications you take. 7. Pain Medication from other Providers: You are not allowed to accept any additional pain medication from any other Doctor or Healthcare provider. There are two exceptions to this rule. (see below) In the event that you require additional pain medication, you are responsible for notifying us, as stated below. 8. Medication Agreement: You are responsible for carefully reading and following our Medication Agreement. This must be signed before receiving any prescriptions from our practice. Safely store a copy of your signed Agreement. Violations to the Agreement will result in no further prescriptions. (Additional copies of our Medication Agreement are available upon request.) 9. Laws, Rules, & Regulations: All patients are expected to follow all Federal and [employer], [employer], Rules, [employer]. Ignorance of the Laws does not constitute a valid excuse. The use of any illegal substances is prohibited. 10. Adopted CDC guidelines & recommendations: Target dosing levels will be at or below 60 MME/day. Use of benzodiazepines** is not recommended.  Exceptions: There are only two exceptions to the rule of not receiving pain medications from other Healthcare Providers. 1. Exception #1 (Emergencies): In the event of an emergency (i.e.: accident requiring emergency care), you are allowed to receive additional pain  medication. However, you are responsible for: As soon as you are able, call our office [phone]  [phone], at any time of the day or night, and leave a message stating your name, the date and nature of the emergency, and the name and dose of the medication prescribed. In the event that your call is answered by a member of our staff, make sure to document and save the date, time, and the name of the person that took your information.  2. Exception #2 (Planned Surgery): In the event that you are scheduled by another doctor or dentist to have any type of surgery or procedure, you are allowed (for a period no longer than 30 days), to receive additional pain medication, for the acute post-op pain. However, in this case, you are responsible for picking up a copy of our "Post-op Pain Management for Surgeons" handout, and giving it to your surgeon or dentist. This document is available at our office, and does not require an appointment to obtain it. Simply go to our office during business hours (Monday-Thursday from 8:00 AM to 4:00 PM) (Friday 8:00 AM to 12:00 Noon) or if you have a scheduled appointment with [place], prior to your surgery, and ask for it by name. In addition, you will need to provide [place] with your name, name of your surgeon, type of surgery, and date of procedure or surgery.  *Opioid medications include: morphine, codeine, oxycodone, oxymorphone, hydrocodone, hydromorphone, meperidine, tramadol, tapentadol, buprenorphine, fentanyl, methadone. **Benzodiazepine medications include: diazepam (Valium), alprazolam (Xanax), clonazepam (Klonopine), lorazepam (Ativan), clorazepate (Tranxene), chlordiazepoxide (Librium), estazolam (Prosom), oxazepam (Serax), temazepam (Restoril), triazolam (Halcion)  ____________________________________________________________________________________________  Preparing for Procedure with Sedation Instructions: . Oral Intake: Do not eat or drink anything for at least 8 hours prior  to your procedure. . Transportation: Public transportation is not allowed. Bring an adult driver. The driver must be physically present in our waiting room before any procedure can be started. Marland Kitchen Physical Assistance: Bring an adult capable of physically assisting you, in the event you need help. . Blood Pressure Medicine: Take your blood pressure medicine with a sip of water the morning of the procedure. . Insulin: Take only  of your normal insulin dose. . Preventing infections: Shower with an antibacterial soap the morning of your procedure. . Build-up your immune system: Take 1000 mg of Vitamin C with every meal (3 times a day) the day prior to your procedure. . Pregnancy: If you are pregnant, call and cancel the procedure. . Sickness: If you have a cold, fever, or any active infections, call and cancel the procedure. . Arrival: You must be in the facility at least 30 minutes prior to your scheduled procedure. . Children: Do not bring children with you. . Dress appropriately: Bring dark clothing that you would not mind if they get stained. . Valuables: Do not bring any jewelry or valuables. Procedure appointments are reserved for interventional treatments only. Marland Kitchen No Prescription Refills. . No medication changes will be discussed during procedure appointments. No disability issues will be discussed.Radiofrequency Lesioning Radiofrequency lesioning is a procedure that is performed to relieve pain. The procedure is often used for back, neck, or arm pain. Radiofrequency lesioning involves the use of a machine that creates radio waves to make heat. During the procedure, the heat is applied to the nerve that carries the pain signal. The heat damages the nerve and interferes with the pain signal. Pain relief usually starts about 2 weeks after the procedure and lasts for 6 months to 1 year. Tell a health care provider about:  Any allergies you have.  All medicines you are  taking, including vitamins,  herbs, eye drops, creams, and over-the-counter medicines.  Any problems you or family members have had with anesthetic medicines.  Any blood disorders you have.  Any surgeries you have had.  Any medical conditions you have.  Whether you are pregnant or may be pregnant. What are the risks? Generally, this is a safe procedure. However, problems may occur, including:  Pain or soreness at the injection site.  Infection at the injection site.  Damage to nerves or blood vessels.  What happens before the procedure?  Ask your health care provider about: ? Changing or stopping your regular medicines. This is especially important if you are taking diabetes medicines or blood thinners. ? Taking medicines such as aspirin and ibuprofen. These medicines can thin your blood. Do not take these medicines before your procedure if your health care provider instructs you not to.  Follow instructions from your health care provider about eating or drinking restrictions.  Plan to have someone take you home after the procedure.  If you go home right after the procedure, plan to have someone with you for 24 hours. What happens during the procedure?  You will be given one or more of the following: ? A medicine to help you relax (sedative). ? A medicine to numb the area (local anesthetic).  You will be awake during the procedure. You will need to be able to talk with the health care provider during the procedure.  With the help of a type of X-Tucciarone (fluoroscopy), the health care provider will insert a radiofrequency needle into the area to be treated.  Next, a wire that carries the radio waves (electrode) will be put through the radiofrequency needle. An electrical pulse will be sent through the electrode to verify the correct nerve. You will feel a tingling sensation, and you may have muscle twitching.  Then, the tissue that is around the needle tip will be heated by an electric current that is passed  using the radiofrequency machine. This will numb the nerves.  A bandage (dressing) will be put on the insertion area after the procedure is done. The procedure may vary among health care providers and hospitals. What happens after the procedure?  Your blood pressure, heart rate, breathing rate, and blood oxygen level will be monitored often until the medicines you were given have worn off.  Return to your normal activities as directed by your health care provider. This information is not intended to replace advice given to you by your health care provider. Make sure you discuss any questions you have with your health care provider. Document Released: 01/23/2011 Document Revised: 11/02/2015 Document Reviewed: 07/04/2014 Elsevier Interactive Patient Education  [employer].

## 2017-06-12 NOTE — Progress Notes (Signed)
Nursing Pain Medication Assessment:  Safety precautions to be maintained throughout the outpatient stay will include: orient to surroundings, keep bed in low position, maintain call bell within reach at all times, provide assistance with transfer out of bed and ambulation.  Medication Inspection Compliance: Pill count conducted under aseptic conditions, in front of the patient. Neither the pills nor the bottle was removed from the patient's sight at any time. Once count was completed pills were immediately returned to the patient in their original bottle.  Medication: Oxycodone IR Pill/Patch Count: 0 of 120 pills remain Pill/Patch Appearance: Markings consistent with prescribed medication Bottle Appearance: Standard pharmacy container. Clearly labeled. Filled Date: 12 /08 / 2018 Last Medication intake:  Today

## 2017-06-12 NOTE — Progress Notes (Signed)
Patient's Name: [name]  MRN: [id number]  Referring Provider: Arnetha Courser, MD  DOB: [birth year]  PCP: Arnetha Courser, MD  DOS: 06/12/2017  Note by: Vevelyn Francois NP  Service setting: Ambulatory outpatient  Specialty: Interventional Pain Management  Location: [name] (AMB) Pain Management Facility    Patient type: Established    Primary Reason(s) for Visit: Encounter for prescription drug management & post-procedure evaluation of chronic illness with mild to moderate exacerbation(Level of risk: moderate) CC: Back Pain  HPI  [name] is a 47 y.o. year old, female patient, who comes today for a post-procedure evaluation and medication management. She has Adjustment disorder with depressed mood; Stressful life events affecting family and household; Preventative health care; Vitamin D deficiency; Lumbosacral spondylosis without myelopathy; Lumbar central spinal stenosis (L4-5); Breast cancer screening; Bladder irritation; Chronic low back pain (Location of Primary Source of Pain) (midline); Arthralgia of multiple joints; Chronic, continuous use of opioids; Elevated rheumatoid factor; Chronic pain syndrome; Long term (current) use of opiate analgesic; Long term prescription opiate use; Opiate use (85.5 MME/Day); Chronic hip pain (Location of Secondary source of pain) (Left); Failed back surgical syndrome; Discogenic low back pain; Lumbar discogenic pain syndrome; Chronic sacroiliac joint pain (Left); Musculoskeletal pain; Lumbar facet syndrome (Bilateral); Lumbar spondylosis; Neurogenic pain; Depression; and Acute postoperative pain on their problem list. Her primarily concern today is the Back Pain  Pain Assessment: Location: Lower Back Radiating: denies Onset: More than a month ago Duration: Chronic pain Quality: Aching, Dull Severity: 3 /10 (self-reported pain score)  Note: Reported level is compatible with observation.                          Effect on ADL:   Timing: Constant Modifying  factors: heat, rest  [name] was last seen on 05/12/2017 for a procedure. During today's appointment we reviewed [name]'s post-procedure results, as well as her outpatient medication regimen. She states that  It felt like it has removed the achyiness. She states that she is doing with the lumbar RFA. She is requesting to have the other side completed. She did ask if she changed her medication to Tramadol would she get more pills. She amdits to the overuse of the Oxycodone. She is not using the cyclobenzaprine and gabapentin as directed.   Further details on both, my assessment(s), as well as the proposed treatment plan, please see below.  Controlled Substance Pharmacotherapy Assessment REMS (Risk Evaluation and Mitigation Strategy)  Analgesic:Oxycodone/acetaminophen IR 10/325 mg every 6 hours (82m/dayof oxycodone) (60MME/day) MME/day:618mday.     Annette ShorterRN  06/12/2017 11:10 AM  Signed Nursing Pain Medication Assessment:  Safety precautions to be maintained throughout the outpatient stay will include: orient to surroundings, keep bed in low position, maintain call bell within reach at all times, provide assistance with transfer out of bed and ambulation.  Medication Inspection Compliance: Pill count conducted under aseptic conditions, in front of the patient. Neither the pills nor the bottle was removed from the patient's sight at any time. Once count was completed pills were immediately returned to the patient in their original bottle.  Medication: Oxycodone IR Pill/Patch Count: 0 of 120 pills remain Pill/Patch Appearance: Markings consistent with prescribed medication Bottle Appearance: Standard pharmacy container. Clearly labeled. Filled Date: 12 /08 / 2018 Last Medication intake:  Today   Pharmacokinetics: Liberation and absorption (onset of action): WNL Distribution (time to peak effect): WNL Metabolism and excretion (duration of action):  WNL          Pharmacodynamics: Desired effects: Analgesia: [name] reports >50% benefit. Functional ability: Patient reports that medication allows her to accomplish basic ADLs Clinically meaningful improvement in function (CMIF): Sustained CMIF goals met Perceived effectiveness: Described as relatively effective, allowing for increase in activities of daily living (ADL) Undesirable effects: Side-effects or Adverse reactions: None reported Monitoring: [place] PMP: Online review of the past 66-monthperiod conducted. Compliant with practice rules and regulations Last UDS on record: Summary  Date Value Ref Range Status  04/14/2017 FINAL  Final    Comment:    ==================================================================== TOXASSURE SELECT 13 (MW) ==================================================================== Test                             Result       Flag       Units Drug Present and Declared for Prescription Verification   Oxycodone                      645          EXPECTED   ng/mg creat   Oxymorphone                    1345         EXPECTED   ng/mg creat   Noroxycodone                   3037         EXPECTED   ng/mg creat   Noroxymorphone                 425          EXPECTED   ng/mg creat    Sources of oxycodone are scheduled prescription medications.    Oxymorphone, noroxycodone, and noroxymorphone are expected    metabolites of oxycodone. Oxymorphone is also available as a    scheduled prescription medication. ==================================================================== Test                      Result    Flag   Units      Ref Range   Creatinine              137              mg/dL      >=20 ==================================================================== Declared Medications:  The flagging and interpretation on this report are based on the  following declared medications.  Unexpected results may arise from  inaccuracies in the declared medications.  **Note: The  testing scope of this panel includes these medications:  Oxycodone  **Note: The testing scope of this panel does not include following  reported medications:  Cyclobenzaprine (Flexeril)  Duloxetine (Cymbalta)  Gabapentin  Meloxicam (Mobic) ==================================================================== For clinical consultation, please call ([phone] ====================================================================    UDS interpretation: Compliant          Medication Assessment Form: Reviewed. Patient indicates being compliant with therapy Treatment compliance: Non-compliant Risk Assessment Profile: Aberrant behavior: drug seeking behavior, unsanctioned dose escalation and unsanctioned use Comorbid factors increasing risk of overdose: See prior notes. No additional risks detected today Risk of substance use disorder (SUD): High Opioid Risk Tool - 05/12/17 1117      Family History of Substance Abuse   Alcohol  Negative    Illegal Drugs  Negative    Rx Drugs  Negative  Personal History of Substance Abuse   Alcohol  Negative    Illegal Drugs  Negative    Rx Drugs  Negative      Age   Age between 56-45 years   No      History of Preadolescent Sexual Abuse   History of Preadolescent Sexual Abuse  Negative or Female      Psychological Disease   Psychological Disease  Negative    ADD  Negative    OCD  Negative    Bipolar  Negative    Schizophrenia  Negative    Depression  Negative      Total Score   Opioid Risk Tool Scoring  0    Opioid Risk Interpretation  Low Risk      ORT Scoring interpretation table:  Score <3 = Low Risk for SUD  Score between 4-7 = Moderate Risk for SUD  Score >8 = High Risk for Opioid Abuse   Risk Mitigation Strategies:  Patient Counseling: Covered Patient-Prescriber Agreement (PPA): Present and active  Notification to other healthcare providers: Done  Pharmacologic Plan: No change in therapy, at this time.              Post-Procedure Assessment  05/06/2017 Procedure: Left sided Lumbar RFA Pre-procedure pain score:  3/10 Post-procedure pain score: 0/10         Influential Factors: BMI: 29.05 kg/m Intra-procedural challenges: None observed.         Assessment challenges: None detected.              Reported side-effects: None.        Post-procedural adverse reactions or complications: None reported         Sedation: Please see nurses note. When no sedatives are used, the analgesic levels obtained are directly associated to the effectiveness of the local anesthetics. However, when sedation is provided, the level of analgesia obtained during the initial 1 hour following the intervention, is believed to be the result of a combination of factors. These factors may include, but are not limited to: 1. The effectiveness of the local anesthetics used. 2. The effects of the analgesic(s) and/or anxiolytic(s) used. 3. The degree of discomfort experienced by the patient at the time of the procedure. 4. The patients ability and reliability in recalling and recording the events. 5. The presence and influence of possible secondary gains and/or psychosocial factors. Reported result: Relief experienced during the 1st hour after the procedure: 100 % (Ultra-Short Term Relief)            Interpretative annotation: Clinically appropriate result. Analgesia during this period is likely to be Local Anesthetic and/or IV Sedative (Analgesic/Anxiolytic) related.          Effects of local anesthetic: The analgesic effects attained during this period are directly associated to the localized infiltration of local anesthetics and therefore cary significant diagnostic value as to the etiological location, or anatomical origin, of the pain. Expected duration of relief is directly dependent on the pharmacodynamics of the local anesthetic used. Long-acting (4-6 hours) anesthetics used.  Reported result: Relief during the next 4 to 6 hour  after the procedure: 100 % (Short-Term Relief)            Interpretative annotation: Clinically appropriate result. Analgesia during this period is likely to be Local Anesthetic-related.          Long-term benefit: Defined as the period of time past the expected duration of local anesthetics (1 hour for short-acting and 4-6 hours  for long-acting). With the possible exception of prolonged sympathetic blockade from the local anesthetics, benefits during this period are typically attributed to, or associated with, other factors such as analgesic sensory neuropraxia, antiinflammatory effects, or beneficial biochemical changes provided by agents other than the local anesthetics.  Reported result: Extended relief following procedure: 30 % (Long-Term Relief)            Interpretative annotation: Clinically appropriate result. Good relief. No permanent benefit expected. Inflammation plays a part in the etiology to the pain.          Current benefits: Defined as reported results that persistent at this point in time.   Analgesia: >50 %            Function: Somewhat improved ROM: Somewhat improved Interpretative annotation: Ongoing benefit. No permanent benefit expected. Adequate RF ablation.          Interpretation: Results would suggest adequate radiofrequency ablation.                  Plan:  Please see "Plan of Care" for details.        Laboratory Chemistry  Inflammation Markers (CRP: Acute Phase) (ESR: Chronic Phase) Lab Results  Component Value Date   CRP 5.3 (H) 12/05/2016   ESRSEDRATE 9 12/05/2016                 Rheumatology Markers Lab Results  Component Value Date   RF 17.9 (H) 12/05/2016   ANA NEG 10/02/2016   LABURIC 2.5 09/18/2016                Renal Function Markers Lab Results  Component Value Date   BUN 17 12/05/2016   CREATININE 0.84 12/05/2016   GFRAA 96 12/05/2016   GFRNONAA 83 12/05/2016                 Hepatic Function Markers Lab Results  Component Value  Date   AST 21 12/05/2016   ALT 20 12/05/2016   ALBUMIN 4.4 12/05/2016   ALKPHOS 59 12/05/2016   LIPASE 107 12/26/2011                 Electrolytes Lab Results  Component Value Date   NA 139 12/05/2016   K 4.6 12/05/2016   CL 103 12/05/2016   CALCIUM 9.5 12/05/2016   MG 2.1 12/05/2016   PHOS 3.0 09/18/2016                 Neuropathy Markers Lab Results  Component Value Date   VITAMINB12 495 12/05/2016                 Bone Pathology Markers Lab Results  Component Value Date   VD25OH 27.1 (L) 09/18/2016   25OHVITD1 49 12/05/2016   25OHVITD2 <1.0 12/05/2016   25OHVITD3 49 12/05/2016                 Coagulation Parameters Lab Results  Component Value Date   INR 0.9 04/16/2013   LABPROT 12.4 04/16/2013   PLT 287 10/02/2016                 Cardiovascular Markers Lab Results  Component Value Date   CKTOTAL 75 04/16/2013   CKMB 0.6 04/16/2013   TROPONINI < 0.02 04/16/2013   HGB 13.3 10/02/2016   HCT 40.8 10/02/2016                 CA Markers No results found for: CEA, CA125, LABCA2  Note: Lab results reviewed.  Recent Diagnostic Imaging Results  DG C-Arm 1-60 Min-No Report Fluoroscopy was utilized by the requesting physician.  No radiographic  interpretation.   Complexity Note: Imaging results reviewed. Results shared with Ms. Zito, using Layman's terms.                         Meds   Current Outpatient Medications:  .  [START ON 06/17/2017] oxyCODONE-acetaminophen (PERCOCET) 10-325 MG tablet, Take 1 tablet by mouth every 6 (six) hours as needed for pain., Disp: 120 tablet, Rfl: 0 .  [START ON 06/17/2017] cyclobenzaprine (FLEXERIL) 10 MG tablet, Take 1 tablet (10 mg total) by mouth at bedtime., Disp: 30 tablet, Rfl: 0 .  DULoxetine (CYMBALTA) 30 MG capsule, Take 1 capsule (30 mg total) by mouth 2 (two) times daily., Disp: 60 capsule, Rfl: 0 .  gabapentin (NEURONTIN) 300 MG capsule, Take 1 capsule (300 mg total) by mouth every 8 (eight) hours.,  Disp: 90 capsule, Rfl: 0 .  [START ON 06/17/2017] meloxicam (MOBIC) 15 MG tablet, Take 1 tablet (15 mg total) by mouth daily., Disp: 30 tablet, Rfl: 0  ROS  Constitutional: Denies any fever or chills Gastrointestinal: No reported hemesis, hematochezia, vomiting, or acute GI distress Musculoskeletal: Denies any acute onset joint swelling, redness, loss of ROM, or weakness Neurological: No reported episodes of acute onset apraxia, aphasia, dysarthria, agnosia, amnesia, paralysis, loss of coordination, or loss of consciousness  Allergies  Ms. Waterworth is allergic to codeine.  PFSH  Drug: Ms. Burgoon  reports that she does not use drugs. Alcohol:  reports that she does not drink alcohol. Tobacco:  reports that she has been smoking cigarettes.  She has a 20.00 pack-year smoking history. she has never used smokeless tobacco. Medical:  has a past medical history of Allergy, Anxiety, Chronic bilateral low back pain (07/22/2016), and Depression. Surgical: Ms. Landgrebe  has a past surgical history that includes Abdominal hysterectomy; Tonsillectomy; Cesarean section; Spine surgery; Spine surgery (09/09/2015); and Back surgery. Family: family history includes Arthritis in her father; Cancer in her maternal aunt; Depression in her sister and sister; Diabetes in her mother; Heart disease in her father and maternal grandfather; Hyperlipidemia in her father; Hypertension in her father and mother; Lung disease in her father.  Constitutional Exam  General appearance: Well nourished, well developed, and well hydrated. In no apparent acute distress Vitals:   06/12/17 1105  BP: 123/87  Pulse: 72  Resp: 16  Temp: 98.4 F (36.9 C)  SpO2: 97%  Weight: 180 lb (81.6 kg)  Height: _0  (1.676 m)   BMI Assessment: Estimated body mass index is 29.05 kg/m as calculated from the following:   Height as of this encounter: _1  (1.676 m).   Weight as of this encounter: 180 lb (81.6 kg). Psych/Mental status: Alert, oriented x 3  (person, place, & time)       Eyes: PERLA Respiratory: No evidence of acute respiratory distress  Cervical Spine Area Exam  Skin & Axial Inspection: No masses, redness, edema, swelling, or associated skin lesions Alignment: Symmetrical Functional ROM: Unrestricted ROM      Stability: No instability detected Muscle Tone/Strength: Functionally intact. No obvious neuro-muscular anomalies detected. Sensory (Neurological): Unimpaired Palpation: No palpable anomalies              Upper Extremity (UE) Exam    Side: Right upper extremity  Side: Left upper extremity  Skin & Extremity Inspection: Skin color, temperature, and hair  growth are WNL. No peripheral edema or cyanosis. No masses, redness, swelling, asymmetry, or associated skin lesions. No contractures.  Skin & Extremity Inspection: Skin color, temperature, and hair growth are WNL. No peripheral edema or cyanosis. No masses, redness, swelling, asymmetry, or associated skin lesions. No contractures.  Functional ROM: Unrestricted ROM          Functional ROM: Unrestricted ROM          Muscle Tone/Strength: Functionally intact. No obvious neuro-muscular anomalies detected.  Muscle Tone/Strength: Functionally intact. No obvious neuro-muscular anomalies detected.  Sensory (Neurological): Unimpaired          Sensory (Neurological): Unimpaired          Palpation: No palpable anomalies              Palpation: No palpable anomalies              Specialized Test(s): Deferred         Specialized Test(s): Deferred          Thoracic Spine Area Exam  Skin & Axial Inspection: No masses, redness, or swelling Alignment: Symmetrical Functional ROM: Unrestricted ROM Stability: No instability detected Muscle Tone/Strength: Functionally intact. No obvious neuro-muscular anomalies detected. Sensory (Neurological): Unimpaired Muscle strength & Tone: No palpable anomalies  Lumbar Spine Area Exam  Skin & Axial Inspection: No masses, redness, or  swelling Alignment: Symmetrical Functional ROM: Unrestricted ROM      Stability: No instability detected Muscle Tone/Strength: Functionally intact. No obvious neuro-muscular anomalies detected. Sensory (Neurological): Unimpaired Palpation: No palpable anomalies       Provocative Tests: Lumbar Hyperextension and rotation test: evaluation deferred today       Lumbar Lateral bending test: evaluation deferred today       Patrick's Maneuver: evaluation deferred today                    Gait & Posture Assessment  Ambulation: Unassisted Gait: Relatively normal for age and body habitus Posture: WNL   Lower Extremity Exam    Side: Right lower extremity  Side: Left lower extremity  Skin & Extremity Inspection: Skin color, temperature, and hair growth are WNL. No peripheral edema or cyanosis. No masses, redness, swelling, asymmetry, or associated skin lesions. No contractures.  Skin & Extremity Inspection: Skin color, temperature, and hair growth are WNL. No peripheral edema or cyanosis. No masses, redness, swelling, asymmetry, or associated skin lesions. No contractures.  Functional ROM: Unrestricted ROM          Functional ROM: Unrestricted ROM          Muscle Tone/Strength: Functionally intact. No obvious neuro-muscular anomalies detected.  Muscle Tone/Strength: Functionally intact. No obvious neuro-muscular anomalies detected.  Sensory (Neurological): Unimpaired  Sensory (Neurological): Unimpaired  Palpation: No palpable anomalies  Palpation: No palpable anomalies   Assessment  Primary Diagnosis & Pertinent Problem List: The primary encounter diagnosis was Lumbar facet syndrome (Bilateral). Diagnoses of Chronic low back pain (Location of Primary Source of Pain) (midline), Musculoskeletal pain, Chronic pain syndrome, Lumbosacral spondylosis without myelopathy, and Lumbar spondylosis were also pertinent to this visit.  Status Diagnosis  Controlled Controlled Controlled 1. Lumbar facet syndrome  (Bilateral)   2. Chronic low back pain (Location of Primary Source of Pain) (midline)   3. Musculoskeletal pain   4. Chronic pain syndrome   5. Lumbosacral spondylosis without myelopathy   6. Lumbar spondylosis     Problems updated and reviewed during this visit: No problems updated. Plan of Care  Pharmacotherapy (  Medications Ordered): Meds ordered this encounter  Medications  . meloxicam (MOBIC) 15 MG tablet    Sig: Take 1 tablet (15 mg total) by mouth daily.    Dispense:  30 tablet    Refill:  0    Do not place medication on "Automatic Refill". Fill one day early if pharmacy is closed on scheduled refill date.    Order Specific Question:   Supervising Provider    Answer:   Milinda Pointer [phone]  . cyclobenzaprine (FLEXERIL) 10 MG tablet    Sig: Take 1 tablet (10 mg total) by mouth at bedtime.    Dispense:  30 tablet    Refill:  0    Do not place this medication, or any other prescription from our practice, on "Automatic Refill". Patient may have prescription filled one day early if pharmacy is closed on scheduled refill date.    Order Specific Question:   Supervising Provider    Answer:   Milinda Pointer [phone]  . oxyCODONE-acetaminophen (PERCOCET) 10-325 MG tablet    Sig: Take 1 tablet by mouth every 6 (six) hours as needed for pain.    Dispense:  120 tablet    Refill:  0    Do not place this medication, or any other prescription from our practice, on "Automatic Refill". Patient may have prescription filled one day early if pharmacy is closed on scheduled refill date. Do not fill until: 06/17/2017 To last until:07/17/2017    Order Specific Question:   Supervising Provider    Answer:   Milinda Pointer [phone]  . gabapentin (NEURONTIN) 300 MG capsule    Sig: Take 1 capsule (300 mg total) by mouth every 8 (eight) hours.    Dispense:  90 capsule    Refill:  0    Do not place this medication, or any other prescription from our practice, on "Automatic Refill". Patient  may have prescription filled one day early if pharmacy is closed on scheduled refill date.    Order Specific Question:   Supervising Provider    Answer:   Milinda Pointer [[id number]]  This SmartLink is deprecated. Use AVSMEDLIST instead to display the medication list for a patient. Medications administered today: Butch Penny. Steely had no medications administered during this visit. Lab-work, procedure(s), and/or referral(s): No orders of the defined types were placed in this encounter.  Imaging and/or referral(s): None  Interventional management options: Planned, scheduled, and/or pending:  Right lumbar facet RFA   Considering:  Diagnostic bilateral lumbar facet block Possible bilateral lumbar facet RFA Diagnostic left sided caudal epidural steroid injection + diagnostic epidurogram Possible left-sided Racz procedure Diagnostic left L4-5 interlaminar lumbar epidural steroid injection Diagnosticleft L4-5 transforaminal epidural steroid injection Diagnostic left L4 selective nerve root block Possible left L4 nerve root ganglion RFA Possible lumbar spinal cord stimulator trial   Palliative PRN treatment(s):  Diagnostic bilateral lumbar facet block(NO STEROIDS)      Provider-requested follow-up: Return in about 4 weeks (around 07/10/2017) for MedMgmt with Me Dionisio David), in addition, w/ Dr. Dossie Arbour, Procedure(w/Sedation), Right Lumbar RFA.  Future Appointments  Date Time Provider [place]  06/26/2017  2:15 PM Milinda Pointer, MD [name] None  07/08/2017 11:30 AM Vevelyn Francois, NP [facility] None   Primary Care Physician: Arnetha Courser, MD Location: [facility] Outpatient Pain Management Facility Note by: Vevelyn Francois NP Date: 06/12/2017; Time: 3:19 PM  Pain Score Disclaimer: We use the NRS-11 scale. This is a self-reported, subjective measurement of pain severity with only modest accuracy. It  is used primarily to identify changes within a particular patient.  It must be understood that outpatient pain scales are significantly less accurate that those used for research, where they can be applied under ideal controlled circumstances with minimal exposure to variables. In reality, the score is likely to be a combination of pain intensity and pain affect, where pain affect describes the degree of emotional arousal or changes in action readiness caused by the sensory experience of pain. Factors such as social and work situation, setting, emotional state, anxiety levels, expectation, and prior pain experience may influence pain perception and show large inter-individual differences that may also be affected by time variables.  Patient instructions provided during this appointment: Patient Instructions  You have gotten a script for percocet.  You have been given pre procedure instructions for RF today.  You have other prescriptions to be picked up at your pharmacy.  today____________________________________________________________________________________________  Medication Rules  Applies to: All patients receiving prescriptions (written or electronic).  Pharmacy of record: Pharmacy where electronic prescriptions will be sent. If written prescriptions are taken to a different pharmacy, please inform the nursing staff. The pharmacy listed in the electronic medical record should be the one where you would like electronic prescriptions to be sent.  Prescription refills: Only during scheduled appointments. Applies to both, written and electronic prescriptions.  NOTE: The following applies primarily to controlled substances (Opioid* Pain Medications).   Patient's responsibilities: 1. Pain Pills: Bring all pain pills to every appointment (except for procedure appointments). 2. Pill Bottles: Bring pills in original pharmacy bottle. Always bring newest bottle. Bring bottle, even if empty. 3. Medication refills: You are responsible for knowing and keeping track of  what medications you need refilled. The day before your appointment, write a list of all prescriptions that need to be refilled. Bring that list to your appointment and give it to the admitting nurse. Prescriptions will be written only during appointments. If you forget a medication, it will not be "Called in", "Faxed", or "electronically sent". You will need to get another appointment to get these prescribed. 4. Prescription Accuracy: You are responsible for carefully inspecting your prescriptions before leaving our office. Have the discharge nurse carefully go over each prescription with you, before taking them home. Make sure that your name is accurately spelled, that your address is correct. Check the name and dose of your medication to make sure it is accurate. Check the number of pills, and the written instructions to make sure they are clear and accurate. Make sure that you are given enough medication to last until your next medication refill appointment. 5. Taking Medication: Take medication as prescribed. Never take more pills than instructed. Never take medication more frequently than prescribed. Taking less pills or less frequently is permitted and encouraged, when it comes to controlled substances (written prescriptions).  6. Inform other Doctors: Always inform, all of your healthcare providers, of all the medications you take. 7. Pain Medication from other Providers: You are not allowed to accept any additional pain medication from any other Doctor or Healthcare provider. There are two exceptions to this rule. (see below) In the event that you require additional pain medication, you are responsible for notifying us, as stated below. 8. Medication Agreement: You are responsible for carefully reading and following our Medication Agreement. This must be signed before receiving any prescriptions from our practice. Safely store a copy of your signed Agreement. Violations to the Agreement will result in  no further prescriptions. (Additional copies of our  Medication Agreement are available upon request.) 9. Laws, Rules, & Regulations: All patients are expected to follow all Federal and [employer], [employer], Rules, [employer] [facility]. Ignorance of the Laws does not constitute a valid excuse. The use of any illegal substances is prohibited. 10. Adopted CDC guidelines & recommendations: Target dosing levels will be at or below 60 MME/day. Use of benzodiazepines** is not recommended.  Exceptions: There are only two exceptions to the rule of not receiving pain medications from other Healthcare Providers. 1. Exception #1 (Emergencies): In the event of an emergency (i.e.: accident requiring emergency care), you are allowed to receive additional pain medication. However, you are responsible for: As soon as you are able, call our office [phone], at any time of the day or night, and leave a message stating your name, the date and nature of the emergency, and the name and dose of the medication prescribed. In the event that your call is answered by a member of our staff, make sure to document and save the date, time, and the name of the person that took your information.  2. Exception #2 (Planned Surgery): In the event that you are scheduled by another doctor or dentist to have any type of surgery or procedure, you are allowed (for a period no longer than 30 days), to receive additional pain medication, for the acute post-op pain. However, in this case, you are responsible for picking up a copy of our "Post-op Pain Management for Surgeons" handout, and giving it to your surgeon or dentist. This document is available at our office, and does not require an appointment to obtain it. Simply go to our office during business hours (Monday-Thursday from 8:00 AM to 4:00 PM) (Friday 8:00 AM to 12:00 Noon) or if you have a scheduled appointment with [place], prior to your surgery, and ask for it by name. In addition, you will need to  provide [place] with your name, name of your surgeon, type of surgery, and date of procedure or surgery.  *Opioid medications include: morphine, codeine, oxycodone, oxymorphone, hydrocodone, hydromorphone, meperidine, tramadol, tapentadol, buprenorphine, fentanyl, methadone. **Benzodiazepine medications include: diazepam (Valium), alprazolam (Xanax), clonazepam (Klonopine), lorazepam (Ativan), clorazepate (Tranxene), chlordiazepoxide (Librium), estazolam (Prosom), oxazepam (Serax), temazepam (Restoril), triazolam (Halcion)  ____________________________________________________________________________________________  Preparing for Procedure with Sedation Instructions: . Oral Intake: Do not eat or drink anything for at least 8 hours prior to your procedure. . Transportation: Public transportation is not allowed. Bring an adult driver. The driver must be physically present in our waiting room before any procedure can be started. Marland Kitchen Physical Assistance: Bring an adult capable of physically assisting you, in the event you need help. . Blood Pressure Medicine: Take your blood pressure medicine with a sip of water the morning of the procedure. . Insulin: Take only  of your normal insulin dose. . Preventing infections: Shower with an antibacterial soap the morning of your procedure. . Build-up your immune system: Take 1000 mg of Vitamin C with every meal (3 times a day) the day prior to your procedure. . Pregnancy: If you are pregnant, call and cancel the procedure. . Sickness: If you have a cold, fever, or any active infections, call and cancel the procedure. . Arrival: You must be in the facility at least 30 minutes prior to your scheduled procedure. . Children: Do not bring children with you. . Dress appropriately: Bring dark clothing that you would not mind if they get stained. . Valuables: Do not bring any jewelry or valuables. Procedure appointments are  reserved for interventional treatments  only. Marland Kitchen No Prescription Refills. . No medication changes will be discussed during procedure appointments. No disability issues will be discussed.Radiofrequency Lesioning Radiofrequency lesioning is a procedure that is performed to relieve pain. The procedure is often used for back, neck, or arm pain. Radiofrequency lesioning involves the use of a machine that creates radio waves to make heat. During the procedure, the heat is applied to the nerve that carries the pain signal. The heat damages the nerve and interferes with the pain signal. Pain relief usually starts about 2 weeks after the procedure and lasts for 6 months to 1 year. Tell a health care provider about:  Any allergies you have.  All medicines you are taking, including vitamins, herbs, eye drops, creams, and over-the-counter medicines.  Any problems you or family members have had with anesthetic medicines.  Any blood disorders you have.  Any surgeries you have had.  Any medical conditions you have.  Whether you are pregnant or may be pregnant. What are the risks? Generally, this is a safe procedure. However, problems may occur, including:  Pain or soreness at the injection site.  Infection at the injection site.  Damage to nerves or blood vessels.  What happens before the procedure?  Ask your health care provider about: ? Changing or stopping your regular medicines. This is especially important if you are taking diabetes medicines or blood thinners. ? Taking medicines such as aspirin and ibuprofen. These medicines can thin your blood. Do not take these medicines before your procedure if your health care provider instructs you not to.  Follow instructions from your health care provider about eating or drinking restrictions.  Plan to have someone take you home after the procedure.  If you go home right after the procedure, plan to have someone with you for 24 hours. What happens during the procedure?  You will be  given one or more of the following: ? A medicine to help you relax (sedative). ? A medicine to numb the area (local anesthetic).  You will be awake during the procedure. You will need to be able to talk with the health care provider during the procedure.  With the help of a type of X-Bettenhausen (fluoroscopy), the health care provider will insert a radiofrequency needle into the area to be treated.  Next, a wire that carries the radio waves (electrode) will be put through the radiofrequency needle. An electrical pulse will be sent through the electrode to verify the correct nerve. You will feel a tingling sensation, and you may have muscle twitching.  Then, the tissue that is around the needle tip will be heated by an electric current that is passed using the radiofrequency machine. This will numb the nerves.  A bandage (dressing) will be put on the insertion area after the procedure is done. The procedure may vary among health care providers and hospitals. What happens after the procedure?  Your blood pressure, heart rate, breathing rate, and blood oxygen level will be monitored often until the medicines you were given have worn off.  Return to your normal activities as directed by your health care provider. This information is not intended to replace advice given to you by your health care provider. Make sure you discuss any questions you have with your health care provider. Document Released: 01/23/2011 Document Revised: 11/02/2015 Document Reviewed: 07/04/2014 Elsevier Interactive Patient Education  [employer].

## 2017-06-23 ENCOUNTER — Telehealth: Payer: Self-pay | Admitting: *Deleted

## 2017-06-23 ENCOUNTER — Other Ambulatory Visit: Payer: Self-pay | Admitting: Nurse Practitioner

## 2017-06-23 DIAGNOSIS — G894 Chronic pain syndrome: Secondary | ICD-10-CM

## 2017-06-23 MED ORDER — OXYCODONE-ACETAMINOPHEN 10-325 MG PO TABS: 1.0000 | ORAL_TABLET | Freq: Four times a day (QID) | ORAL | 0 refills | Status: DC | PRN

## 2017-06-23 NOTE — Telephone Encounter (Signed)
Talked with insurance company for 40 minutes and they would like a faxed PA for this med. [name] is sending a form. Will need a new prescription.

## 2017-06-23 NOTE — Telephone Encounter (Signed)
New prescription written for 30 days. Waiting for Form for PA

## 2017-06-23 NOTE — Telephone Encounter (Signed)
Called [facility] and they stated that her insurance company will only pay for 7 days of narc at a time. Will need to either write prescription every 7 days or call for a PA with insurance company.

## 2017-06-24 NOTE — Telephone Encounter (Signed)
PA sent

## 2017-06-26 ENCOUNTER — Encounter: Payer: Self-pay | Admitting: Pain Medicine

## 2017-06-26 ENCOUNTER — Ambulatory Visit
Admission: RE | Admit: 2017-06-26 | Discharge: 2017-06-26 | Disposition: A | Discharge status: Home / Self Care | Payer: Managed Care, Other (non HMO) | Source: Ambulatory Visit | Attending: Pain Medicine | Admitting: Pain Medicine

## 2017-06-26 ENCOUNTER — Ambulatory Visit (HOSPITAL_BASED_OUTPATIENT_CLINIC_OR_DEPARTMENT_OTHER): Payer: Managed Care, Other (non HMO) | Admitting: Pain Medicine

## 2017-06-26 VITALS — BP 126/95 | HR 80 | Temp 97.8°F | Resp 14 | Ht 66.0 in | Wt 180.0 lb

## 2017-06-26 DIAGNOSIS — Z791 Long term (current) use of non-steroidal anti-inflammatories (NSAID): Secondary | ICD-10-CM | POA: Insufficient documentation

## 2017-06-26 DIAGNOSIS — Z885 Allergy status to narcotic agent status: Secondary | ICD-10-CM | POA: Insufficient documentation

## 2017-06-26 DIAGNOSIS — Z79891 Long term (current) use of opiate analgesic: Secondary | ICD-10-CM | POA: Diagnosis not present

## 2017-06-26 DIAGNOSIS — M488X6 Other specified spondylopathies, lumbar region: Secondary | ICD-10-CM | POA: Diagnosis not present

## 2017-06-26 DIAGNOSIS — M545 Low back pain: Secondary | ICD-10-CM | POA: Diagnosis present

## 2017-06-26 DIAGNOSIS — Z79899 Other long term (current) drug therapy: Secondary | ICD-10-CM | POA: Insufficient documentation

## 2017-06-26 DIAGNOSIS — G894 Chronic pain syndrome: Secondary | ICD-10-CM | POA: Diagnosis not present

## 2017-06-26 DIAGNOSIS — G8918 Other acute postprocedural pain: Secondary | ICD-10-CM | POA: Insufficient documentation

## 2017-06-26 DIAGNOSIS — F419 Anxiety disorder, unspecified: Secondary | ICD-10-CM | POA: Insufficient documentation

## 2017-06-26 DIAGNOSIS — M5442 Lumbago with sciatica, left side: Secondary | ICD-10-CM | POA: Diagnosis not present

## 2017-06-26 DIAGNOSIS — Z90710 Acquired absence of both cervix and uterus: Secondary | ICD-10-CM | POA: Insufficient documentation

## 2017-06-26 DIAGNOSIS — M47816 Spondylosis without myelopathy or radiculopathy, lumbar region: Secondary | ICD-10-CM

## 2017-06-26 DIAGNOSIS — M7918 Myalgia, other site: Secondary | ICD-10-CM

## 2017-06-26 DIAGNOSIS — G8929 Other chronic pain: Secondary | ICD-10-CM

## 2017-06-26 MED ORDER — ROPIVACAINE HCL 2 MG/ML IJ SOLN
9.0000 mL | Freq: Once | INTRAMUSCULAR | Status: AC
  Administered 2017-06-26: 9 mL via PERINEURAL

## 2017-06-26 MED ORDER — CYCLOBENZAPRINE HCL 10 MG PO TABS: 10.0000 mg | ORAL_TABLET | Freq: Every day | ORAL | 0 refills | Status: DC

## 2017-06-26 MED ORDER — MIDAZOLAM HCL 5 MG/5ML IJ SOLN
INTRAMUSCULAR | Status: AC
  Filled 2017-06-26: qty 5

## 2017-06-26 MED ORDER — TRIAMCINOLONE ACETONIDE 40 MG/ML IJ SUSP
INTRAMUSCULAR | Status: AC
Start: 2017-06-26 — End: 2017-06-26
  Filled 2017-06-26: qty 1

## 2017-06-26 MED ORDER — FENTANYL CITRATE (PF) 100 MCG/2ML IJ SOLN
INTRAMUSCULAR | Status: AC
  Filled 2017-06-26: qty 2

## 2017-06-26 MED ORDER — MIDAZOLAM HCL 5 MG/5ML IJ SOLN
1.0000 mg | INTRAMUSCULAR | Status: AC | PRN
  Administered 2017-06-26: 3 mg via INTRAVENOUS

## 2017-06-26 MED ORDER — TRIAMCINOLONE ACETONIDE 40 MG/ML IJ SUSP
40.0000 mg | Freq: Once | INTRAMUSCULAR | Status: AC
  Administered 2017-06-26: 40 mg

## 2017-06-26 MED ORDER — DULOXETINE HCL 30 MG PO CPEP: 30.0000 mg | ORAL_CAPSULE | Freq: Two times a day (BID) | ORAL | 0 refills | Status: DC

## 2017-06-26 MED ORDER — LACTATED RINGERS IV SOLN
1000.0000 mL | Freq: Once | INTRAVENOUS | Status: AC
  Administered 2017-06-26: 1000 mL via INTRAVENOUS

## 2017-06-26 MED ORDER — HYDROCODONE-ACETAMINOPHEN 5-325 MG PO TABS: 1.0000 | ORAL_TABLET | Freq: Three times a day (TID) | ORAL | 0 refills | Status: AC | PRN

## 2017-06-26 MED ORDER — LIDOCAINE HCL 2 % IJ SOLN
INTRAMUSCULAR | Status: AC
  Filled 2017-06-26: qty 20

## 2017-06-26 MED ORDER — MELOXICAM 15 MG PO TABS: 15.0000 mg | ORAL_TABLET | Freq: Every day | ORAL | 0 refills | Status: DC

## 2017-06-26 MED ORDER — GABAPENTIN 300 MG PO CAPS
300.0000 mg | ORAL_CAPSULE | Freq: Three times a day (TID) | ORAL | 0 refills | Status: DC
Start: 2017-07-23 — End: 2017-08-14

## 2017-06-26 MED ORDER — ROPIVACAINE HCL 2 MG/ML IJ SOLN
INTRAMUSCULAR | Status: AC
  Filled 2017-06-26: qty 10

## 2017-06-26 MED ORDER — LIDOCAINE HCL 2 % IJ SOLN
10.0000 mL | Freq: Once | INTRAMUSCULAR | Status: AC
  Administered 2017-06-26: 200 mg

## 2017-06-26 MED ORDER — FENTANYL CITRATE (PF) 100 MCG/2ML IJ SOLN
25.0000 ug | INTRAMUSCULAR | Status: AC | PRN
  Administered 2017-06-26: 100 ug via INTRAVENOUS

## 2017-06-26 MED ORDER — OXYCODONE-ACETAMINOPHEN 10-325 MG PO TABS: 1.0000 | ORAL_TABLET | Freq: Four times a day (QID) | ORAL | 0 refills | Status: DC | PRN

## 2017-06-26 NOTE — Patient Instructions (Signed)

## 2017-06-26 NOTE — Progress Notes (Signed)
Safety precautions to be maintained throughout the outpatient stay will include: orient to surroundings, keep bed in low position, maintain call bell within reach at all times, provide assistance with transfer out of bed and ambulation.  

## 2017-06-26 NOTE — Progress Notes (Signed)
Patient's Name: [name]  MRN: [id number]  Referring Provider: Milinda Pointer, MD  DOB: 1971/03/26  PCP: Arnetha Courser, MD  DOS: 06/26/2017  Note by: Gaspar Cola, MD  Service setting: Ambulatory outpatient  Specialty: Interventional Pain Management  Patient type: Established  Location: [name] (AMB) Pain Management Facility  Visit type: Interventional Procedure   Primary Reason for Visit: Interventional Pain Management Treatment. CC: Back Pain (lower left)  Procedure:  Anesthesia, Analgesia, Anxiolysis:  Type: Therapeutic Medial Branch Facet Radiofrequency Ablation Region: Lumbar Level: L2, L3, L4, L5, & S1 Medial Branch Level(s) Laterality: Right-Sided  Type: Local Anesthesia with Moderate (Conscious) Sedation Local Anesthetic: Lidocaine 1% Route: Intravenous (IV) IV Access: Secured Sedation: Meaningful verbal contact was maintained at all times during the procedure  Indication(s): Analgesia and Anxiety   Indications: 1. Lumbar facet syndrome (Bilateral)   2. Chronic low back pain (Primary Source of Pain) (midline)   3. Lumbar spondylosis   4. Chronic low back pain (Location of Primary Source of Pain) (midline)   5. Chronic pain syndrome   6. Musculoskeletal pain   7. Acute postoperative pain   8. Lumbar facet joint syndrome   9. Chronic midline low back pain with left-sided sciatica   10. Spondylosis of lumbar spine    [name] has either failed to respond, was unable to tolerate, or simply did not get enough benefit from other more conservative therapies including, but not limited to: 1. Over-the-counter medications 2. Anti-inflammatory medications 3. Muscle relaxants 4. Membrane stabilizers 5. Opioids 6. Physical therapy 7. Modalities (Heat, ice, etc.) 8. Invasive techniques such as nerve blocks. [name] has attained more than 50% relief of the pain from a series of diagnostic injections conducted in separate occasions.  Pain Score: Pre-procedure: 4  /10 Post-procedure: 0-No pain/10  Pre-op Assessment:  [name] is a 47 y.o. (year old), female patient, seen today for interventional treatment. She  has a past surgical history that includes Abdominal hysterectomy; Tonsillectomy; Cesarean section; Spine surgery; Spine surgery (09/09/2015); and Back surgery. [name] has a current medication list which includes the following prescription(s): cyclobenzaprine, gabapentin, meloxicam, oxycodone-acetaminophen, duloxetine, and hydrocodone-acetaminophen. Her primarily concern today is the Back Pain (lower left)  Initial Vital Signs: Blood pressure 130/83, pulse 83, temperature 98.2 F (36.8 C), temperature source Oral, resp. rate 16, height 5\' 6"  (1.676 m), weight 180 lb (81.6 kg), SpO2 97 %. BMI: Estimated body mass index is 29.05 kg/m as calculated from the following:   Height as of this encounter: 5\' 6"  (1.676 m).   Weight as of this encounter: 180 lb (81.6 kg).  Risk Assessment: Allergies: Reviewed. She is allergic to codeine.  Allergy Precautions: None required Coagulopathies: Reviewed. None identified.  Blood-thinner therapy: None at this time Active Infection(s): Reviewed. None identified. [name] is afebrile  Site Confirmation: [name] was asked to confirm the procedure and laterality before marking the site Procedure checklist: Completed Consent: Before the procedure and under the influence of no sedative(s), amnesic(s), or anxiolytics, the patient was informed of the treatment options, risks and possible complications. To fulfill our ethical and legal obligations, as recommended by the American Medical Association's Code of Ethics, I have informed the patient of my clinical impression; the nature and purpose of the treatment or procedure; the risks, benefits, and possible complications of the intervention; the alternatives, including doing nothing; the risk(s) and benefit(s) of the alternative treatment(s) or procedure(s); and the risk(s) and  benefit(s) of doing nothing. The patient was provided  information about the general risks and possible complications associated with the procedure. These may include, but are not limited to: failure to achieve desired goals, infection, bleeding, organ or nerve damage, allergic reactions, paralysis, and death. In addition, the patient was informed of those risks and complications associated to Spine-related procedures, such as failure to decrease pain; infection (i.e.: Meningitis, epidural or intraspinal abscess); bleeding (i.e.: epidural hematoma, subarachnoid hemorrhage, or any other type of intraspinal or peri-dural bleeding); organ or nerve damage (i.e.: Any type of peripheral nerve, nerve root, or spinal cord injury) with subsequent damage to sensory, motor, and/or autonomic systems, resulting in permanent pain, numbness, and/or weakness of one or several areas of the body; allergic reactions; (i.e.: anaphylactic reaction); and/or death. Furthermore, the patient was informed of those risks and complications associated with the medications. These include, but are not limited to: allergic reactions (i.e.: anaphylactic or anaphylactoid reaction(s)); adrenal axis suppression; blood sugar elevation that in diabetics may result in ketoacidosis or comma; water retention that in patients with history of congestive heart failure may result in shortness of breath, pulmonary edema, and decompensation with resultant heart failure; weight gain; swelling or edema; medication-induced neural toxicity; particulate matter embolism and blood vessel occlusion with resultant organ, and/or nervous system infarction; and/or aseptic necrosis of one or more joints. Finally, the patient was informed that Medicine is not an exact science; therefore, there is also the possibility of unforeseen or unpredictable risks and/or possible complications that may result in a catastrophic outcome. The patient indicated having understood very  clearly. We have given the patient no guarantees and we have made no promises. Enough time was given to the patient to ask questions, all of which were answered to the patient's satisfaction. Ms. Lepkowski has indicated that she wanted to continue with the procedure. Attestation: I, the ordering provider, attest that I have discussed with the patient the benefits, risks, side-effects, alternatives, likelihood of achieving goals, and potential problems during recovery for the procedure that I have provided informed consent. Date: 06/26/2017; Time: 3:05 PM  Pre-Procedure Preparation:  Monitoring: As per clinic protocol. Respiration, ETCO2, SpO2, BP, heart rate and rhythm monitor placed and checked for adequate function Safety Precautions: Patient was assessed for positional comfort and pressure points before starting the procedure. Time-out: I initiated and conducted the "Time-out" before starting the procedure, as per protocol. The patient was asked to participate by confirming the accuracy of the "Time Out" information. Verification of the correct person, site, and procedure were performed and confirmed by me, the nursing staff, and the patient. "Time-out" conducted as per Joint Commission's Universal Protocol (UP.01.01.01). "Time-out" Date & Time: 06/26/2017; 1545 hrs.  Description of Procedure Process:   Position: Prone Target Area: For Lumbar Facet blocks, the target is the groove formed by the junction of the transverse process and superior articular process. For the L5 dorsal ramus, the target is the notch between superior articular process and sacral ala. For the S1 dorsal ramus, the target is the superior and lateral edge of the posterior S1 Sacral foramen. Approach: Paraspinal approach. Area Prepped: Entire Posterior Lumbosacral Region Prepping solution: Hibiclens (4.0% Chlorhexidine gluconate solution) Safety Precautions: Aspiration looking for blood return was conducted prior to all injections. At  no point did we inject any substances, as a needle was being advanced. No attempts were made at seeking any paresthesias. Safe injection practices and needle disposal techniques used. Medications properly checked for expiration dates. SDV (single dose vial) medications used. Description of the Procedure: Protocol guidelines  were followed. The patient was placed in position over the procedure table. The target area was identified and the area prepped in the usual manner. The skin and muscle were infiltrated with local anesthetic. Appropriate amount of time allowed to pass for local anesthetics to take effect. Radiofrequency needles were introduced to the target area using fluoroscopic guidance. Using the NeuroTherm NT1100 Radiofrequency Generator, sensory stimulation using 50 Hz was used to locate & identify the nerve, making sure that the needle was positioned such that there was no sensory stimulation below 0.3 V or above 0.7 V. Stimulation using 2 Hz was used to evaluate the motor component. Care was taken not to lesion any nerves that demonstrated motor stimulation of the lower extremities at an output of less than 2.5 times that of the sensory threshold, or a maximum of 2.0 V. Once satisfactory placement of the needles was achieved, the numbing solution was slowly injected after negative aspiration. After waiting for at least 2 minutes, the ablation was performed at 80 degrees C for 60 seconds, using regular Radiofrequency settings. Once the procedure was completed, the needles were then removed and the area cleansed, making sure to leave some of the prepping solution back to take advantage of its long term bactericidal properties. Intra-operative Compliance: Compliant  Illustration of the posterior view of the lumbar spine and the posterior neural structures. Laminae of L2 through S1 are labeled. DPRL5, dorsal primary ramus of L5; DPRS1, dorsal primary ramus of S1; DPR3, dorsal primary ramus of L3; FJ,  facet (zygapophyseal) joint L3-L4; I, inferior articular process of L4; LB1, lateral branch of dorsal primary ramus of L1; IAB, inferior articular branches from L3 medial branch (supplies L4-L5 facet joint); IBP, intermediate branch plexus; MB3, medial branch of dorsal primary ramus of L3; NR3, third lumbar nerve root; S, superior articular process of L5; SAB, superior articular branches from L4 (supplies L4-5 facet joint also); TP3, transverse process of L3.  Vitals:   06/26/17 1624 06/26/17 1628 06/26/17 1638 06/26/17 1644  BP: 110/66 113/77 112/84 (!) 126/95  Pulse:      Resp: 12 14 16 14   Temp: 97.8 F (36.6 C)     TempSrc: Temporal     SpO2: 94% 93% 95% 96%  Weight:      Height:        Start Time: 1545 hrs. End Time: 1613 hrs. Materials & Medications:  Needle(s) Type: Teflon-coated, curved tip, Radiofrequency needle(s) Gauge: 22G Length: 10cm Medication(s): We administered midazolam, fentaNYL, lactated ringers, lidocaine, ropivacaine (PF) 2 mg/mL (0.2%), and triamcinolone acetonide. Please see chart orders for dosing details.  Imaging Guidance (Spinal):  Type of Imaging Technique: Fluoroscopy Guidance (Spinal) Indication(s): Assistance in needle guidance and placement for procedures requiring needle placement in or near specific anatomical locations not easily accessible without such assistance. Exposure Time: Please see nurses notes. Contrast: None used. Fluoroscopic Guidance: I was personally present during the use of fluoroscopy. "Tunnel Vision Technique" used to obtain the best possible view of the target area. Parallax error corrected before commencing the procedure. "Direction-depth-direction" technique used to introduce the needle under continuous pulsed fluoroscopy. Once target was reached, antero-posterior, oblique, and lateral fluoroscopic projection used confirm needle placement in all planes. Images permanently stored in EMR. Interpretation: No contrast injected. I  personally interpreted the imaging intraoperatively. Adequate needle placement confirmed in multiple planes. Permanent images saved into the patient's record.  Antibiotic Prophylaxis:  Indication(s): None identified Antibiotic given: None  Post-operative Assessment:  EBL: None Complications: No immediate post-treatment  complications observed by team, or reported by patient. Note: The patient tolerated the entire procedure well. A repeat set of vitals were taken after the procedure and the patient was kept under observation following institutional policy, for this type of procedure. Post-procedural neurological assessment was performed, showing return to baseline, prior to discharge. The patient was provided with post-procedure discharge instructions, including a section on how to identify potential problems. Should any problems arise concerning this procedure, the patient was given instructions to immediately contact us, at any time, without hesitation. In any case, we plan to contact the patient by telephone for a follow-up status report regarding this interventional procedure. Comments:  No additional relevant information.  Plan of Care    Imaging Orders     DG C-Arm 1-60 Min-No Report  Procedure Orders     Radiofrequency,Lumbar  Medications ordered for procedure: Meds ordered this encounter  Medications  . meloxicam (MOBIC) 15 MG tablet    Sig: Take 1 tablet (15 mg total) by mouth daily.    Dispense:  30 tablet    Refill:  0    Do not place medication on "Automatic Refill". Fill one day early if pharmacy is closed on scheduled refill date.  . DULoxetine (CYMBALTA) 30 MG capsule    Sig: Take 1 capsule (30 mg total) by mouth 2 (two) times daily.    Dispense:  60 capsule    Refill:  0    Do not place medication on "Automatic Refill". Fill one day early if pharmacy is closed on scheduled refill date.  Marland Kitchen oxyCODONE-acetaminophen (PERCOCET) 10-325 MG tablet    Sig: Take 1 tablet by  mouth every 6 (six) hours as needed for pain.    Dispense:  120 tablet    Refill:  0    Do not place this medication, or any other prescription from our practice, on "Automatic Refill". Patient may have prescription filled one day early if pharmacy is closed on scheduled refill date. Do not fill until: 07/23/2017 To last until: 08/22/17  . cyclobenzaprine (FLEXERIL) 10 MG tablet    Sig: Take 1 tablet (10 mg total) by mouth at bedtime.    Dispense:  30 tablet    Refill:  0    Do not place this medication, or any other prescription from our practice, on "Automatic Refill". Patient may have prescription filled one day early if pharmacy is closed on scheduled refill date.  . gabapentin (NEURONTIN) 300 MG capsule    Sig: Take 1 capsule (300 mg total) by mouth every 8 (eight) hours.    Dispense:  90 capsule    Refill:  0    Do not place this medication, or any other prescription from our practice, on "Automatic Refill". Patient may have prescription filled one day early if pharmacy is closed on scheduled refill date.  . midazolam (VERSED) 5 MG/5ML injection 1-2 mg    Make sure Flumazenil is available in the pyxis when using this medication. If oversedation occurs, administer 0.2 mg IV over 15 sec. If after 45 sec no response, administer 0.2 mg again over 1 min; may repeat at 1 min intervals; not to exceed 4 doses (1 mg)  . fentaNYL (SUBLIMAZE) injection 25-50 mcg    Make sure Narcan is available in the pyxis when using this medication. In the event of respiratory depression (RR< 8/min): Titrate NARCAN (naloxone) in increments of 0.1 to 0.2 mg IV at 2-3 minute intervals, until desired degree of reversal.  . lactated ringers  infusion 1,000 mL  . lidocaine (XYLOCAINE) 2 % (with pres) injection 200 mg  . ropivacaine (PF) 2 mg/mL (0.2%) (NAROPIN) injection 9 mL  . triamcinolone acetonide (KENALOG-40) injection 40 mg  . HYDROcodone-acetaminophen (NORCO/VICODIN) 5-325 MG tablet    Sig: Take 1 tablet by  mouth every 8 (eight) hours as needed for up to 7 days for severe pain.    Dispense:  21 tablet    Refill:  0    For acute post-operative pain. Not to be refilled. To last 7 days.   Medications administered: We administered midazolam, fentaNYL, lactated ringers, lidocaine, ropivacaine (PF) 2 mg/mL (0.2%), and triamcinolone acetonide.  See the medical record for exact dosing, route, and time of administration.  New Prescriptions   HYDROCODONE-ACETAMINOPHEN (NORCO/VICODIN) 5-325 MG TABLET    Take 1 tablet by mouth every 8 (eight) hours as needed for up to 7 days for severe pain.   Disposition: Discharge home  Discharge Date & Time: 06/26/2017; 1652 hrs.   Physician-requested Follow-up: Return for post-procedure eval (2 wks), w/ Dr. Dossie Arbour.  Future Appointments  Date Time Provider [place]  06/30/2017  2:40 PM Arnetha Courser, MD [place] [facility]  07/08/2017 11:30 AM Vevelyn Francois, NP [name] None  07/21/2017  8:15 AM Milinda Pointer, MD [facility] None   Primary Care Physician: Arnetha Courser, MD Location: [facility] Note by: Gaspar Cola, MD Date: 06/26/2017; Time: 4:18 PM  Disclaimer:  Medicine is not an Chief Strategy Officer. The only guarantee in medicine is that nothing is guaranteed. It is important to note that the decision to proceed with this intervention was based on the information collected from the patient. The Data and conclusions were drawn from the patient's questionnaire, the interview, and the physical examination. Because the information was provided in large part by the patient, it cannot be guaranteed that it has not been purposely or unconsciously manipulated. Every effort has been made to obtain as much relevant data as possible for this evaluation. It is important to note that the conclusions that lead to this procedure are derived in large part from the available data. Always take into account that the treatment will also be  dependent on availability of resources and existing treatment guidelines, considered by other Pain Management Practitioners as being common knowledge and practice, at the time of the intervention. For Medico-Legal purposes, it is also important to point out that variation in procedural techniques and pharmacological choices are the acceptable norm. The indications, contraindications, technique, and results of the above procedure should only be interpreted and judged by a Board-Certified Interventional Pain Specialist with extensive familiarity and expertise in the same exact procedure and technique.

## 2017-06-27 ENCOUNTER — Telehealth: Payer: Self-pay | Admitting: *Deleted

## 2017-06-27 NOTE — Telephone Encounter (Signed)
Voicemail left with patient post procedure to please call our office if there are any questions or concerns.

## 2017-06-30 ENCOUNTER — Ambulatory Visit: Payer: Managed Care, Other (non HMO) | Admitting: Family Medicine

## 2017-06-30 ENCOUNTER — Encounter: Payer: Self-pay | Admitting: Family Medicine

## 2017-06-30 DIAGNOSIS — F329 Major depressive disorder, single episode, unspecified: Secondary | ICD-10-CM

## 2017-06-30 DIAGNOSIS — M47816 Spondylosis without myelopathy or radiculopathy, lumbar region: Secondary | ICD-10-CM | POA: Diagnosis not present

## 2017-06-30 DIAGNOSIS — F32A Depression, unspecified: Secondary | ICD-10-CM

## 2017-06-30 DIAGNOSIS — M961 Postlaminectomy syndrome, not elsewhere classified: Secondary | ICD-10-CM

## 2017-06-30 DIAGNOSIS — M5442 Lumbago with sciatica, left side: Secondary | ICD-10-CM | POA: Diagnosis not present

## 2017-06-30 DIAGNOSIS — Z79891 Long term (current) use of opiate analgesic: Secondary | ICD-10-CM | POA: Diagnosis not present

## 2017-06-30 DIAGNOSIS — G8929 Other chronic pain: Secondary | ICD-10-CM

## 2017-06-30 NOTE — Assessment & Plan Note (Signed)
Patient uses opiates chronically, aware that that will limit or restrict some employment options

## 2017-06-30 NOTE — Patient Instructions (Signed)
If you have not heard anything from my staff in a week about any orders/referrals/studies from today, please contact us here to follow-up [phone]  

## 2017-06-30 NOTE — Assessment & Plan Note (Addendum)
Seeing pain clinic specialist; if patient is interested in pursuing disability, I suggested that she obtain a letter from her treating physician; since I don't manage this condition, I am not comfortable writing a letter stating that she is disabled from this; from my understanding, the state has providers who do disability evaluations and exams; she will seek a letter from her treating physician if he/she can support her application for disability

## 2017-06-30 NOTE — Assessment & Plan Note (Signed)
One doctor labeled her as bipolar, but we discussed her seeing a psychiatrist to clarify the diagnosis and to optimize treatment and to psychologist for CBT for diminuition of pain as well through breathing or distraction techniques

## 2017-06-30 NOTE — Assessment & Plan Note (Signed)
Seeing pain specialist

## 2017-06-30 NOTE — Progress Notes (Signed)
BP 116/78   Pulse 94   Temp 97.8 F (36.6 C) (Oral)   Resp 16   Wt 183 lb 12.8 oz (83.4 kg)   SpO2 97%   BMI 29.67 kg/m    Subjective:    Patient ID: [name], female    DOB: 09/27/1970, 47 y.o.   MRN: [id number]  HPI: [name] is a 47 y.o. female  Chief Complaint  Patient presents with  . 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 Dossie Arbour and he injected her nerves and burned them; she sees his PA for the pain management; burned the nerves last Thursday; the first one was a couple of months ago, didn't help at all and maybe made it worse; he said they're getting through layers to get to the actual problem She saw Dr. Esmeralda Arthur in [place], with her for 3 years; the injections did not work; she gave up hope and didn't think there was any treatment; she had surgeries and a rod, and thought that would be it She can't survive with the medicine, "without them it's torture" she says She has an ache in the center of her back that doesn't go away; on pills Taking gabapentin, that makes her more loopy, has gotten sick on it, "I can't function" on that She is never pain-free, living from one hour to the next; doing this for 8 years Got an attorney to try to get disability; ruined her marriage; "I'm not what I used to be" She repeated that her dad has lymphoma She likes working, has  worked since age 2 Just struggling with what she used to be, and what she is now I asked if she went to see a counselor, and she said no, even thought I suggested that Not seeing neurosurgeon or orthopaedic surgeon any more She has not been to a doctor to have a disability determination; Dr. 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; he (psychiatrist) was trying to see if she was mentally disabled; she says she has been diagnosed as bipolar, but "the medicines make me deathly sick" "If I'd went with the mental part, I'd have been approved right away" she says; he labeled her as bipoloar, but the medicine made her sick She says "I feel like my back is the real deal" She says that nobody will  hire her being on this much medication She does not drive much because of the medicine; daughter drives her She used to have a nice house and nice car; it's all gone; "I've lost it"; was laying in the bed and wondering what she was going to do; had to call the ambulance to get her off of the floor in the past; she has pride and doesn't want to give up; every day is a struggle  SI joint xrays 12/05/16 CLINICAL DATA:  Chronic pain radiating to LEFT hip, back surgery in April 2017  EXAM: BILATERAL SACROILIAC JOINTS - 3+ VIEW  COMPARISON:  None  FINDINGS: SI joints symmetric and preserved.  Prior posterior fusion L4-L5 with BILATERAL pedicle screws/bars and intervening disc prosthesis.  Sacrum unremarkable.  Visualized pelvis  intact.  IMPRESSION: No acute abnormalities.  Prior L4-L5 fusion.   Electronically Signed   By: Lavonia Dana M.D.   On: 12/05/2016 14:29  Hip / pelvis 12/05/2016 CLINICAL DATA:  Chronic pain radiating to LEFT hip for couple weeks, no injury, back surgery in April 2017  EXAM: DG HIP (WITH OR WITHOUT PELVIS) 2-3V LEFT  COMPARISON:  None  FINDINGS: Osseous mineralization grossly normal for technique.  Hip and SI joint spaces preserved.  No acute fracture, dislocation, or bone destruction.  Prior L4-L5 posterior fusion with intervening disc prosthesis.  IMPRESSION: No acute osseous abnormalities.  Prior L4-L5 posterior fusion.   Electronically Signed   By: Lavonia Dana M.D.   On: 12/05/2016 14:28  Patient has copies of other reports  Depression screen [facility] 2/9 06/30/2017 06/12/2017 05/12/2017 05/06/2017 04/14/2017  Decreased Interest 0 0 0 0 0  Down, Depressed, Hopeless 1 0 0 0 0  PHQ - 2 Score 1 0 0 0 0  Altered sleeping - - - - -  Tired, decreased energy - - - - -  Change in appetite - - - - -  Feeling bad or failure about yourself  - - - - -  Trouble concentrating - - - - -  Moving slowly or fidgety/restless - - - - -  Suicidal thoughts - - - - -  PHQ-9 Score - - - - -  Difficult doing work/chores - - - - -    Relevant past medical, surgical, family and social history reviewed Past Medical History:  Diagnosis Date  . Allergy   . Anxiety   . Chronic bilateral low back pain 07/22/2016   Started in her 20's; managed by pain clinic  . Depression    Past Surgical History:  Procedure Laterality Date  . ABDOMINAL HYSTERECTOMY    . BACK SURGERY     two back sx one in [place] and one at [place]  . CESAREAN SECTION    . SPINE SURGERY    . SPINE SURGERY  09/09/2015  . TONSILLECTOMY     Family History  Problem Relation Age of Onset  . Hypertension Mother   . Diabetes Mother   . Hypertension Father   . Hyperlipidemia Father   . Arthritis Father    . Lung disease Father   . Heart disease Father   . Lymphoma Father   . Cancer Maternal Aunt        breast  . Depression Sister   . Heart disease Maternal Grandfather   . Depression Sister   . Stroke Neg Hx    Social History   Tobacco Use  . Smoking status: Current Every Day Smoker    Packs/day: 1.00  Years: 20.00    Pack years: 20.00    Types: Cigarettes  . Smokeless tobacco: Never Used  Substance Use Topics  . Alcohol use: No  . Drug use: No    Interim medical history since last visit reviewed. Allergies and medications reviewed  Review of Systems Per HPI unless specifically indicated above     Objective:    BP 116/78   Pulse 94   Temp 97.8 F (36.6 C) (Oral)   Resp 16   Wt 183 lb 12.8 oz (83.4 kg)   SpO2 97%   BMI 29.67 kg/m   Wt Readings from Last 3 Encounters:  06/30/17 183 lb 12.8 oz (83.4 kg)  06/26/17 180 lb (81.6 kg)  06/12/17 180 lb (81.6 kg)    Physical Exam  Constitutional: She appears well-developed and well-nourished. No distress.  Eyes: EOM are normal. No scleral icterus.  Neck: No thyromegaly present.  Cardiovascular: Normal rate.  Pulmonary/Chest: Effort normal.  Abdominal: She exhibits no distension.  Skin: No pallor.  Psychiatric: She has a normal mood and affect. Her behavior is normal. Judgment and thought content normal.    Results for orders placed or performed in visit on 04/14/17  Tox[employer] Select 13 (MW), Urine  Result Value Ref Range   Summary FINAL       Assessment & Plan:   Problem List Items Addressed This Visit      Nervous and Auditory   Chronic low back pain (Primary Source of Pain) (midline) (Chronic)    Seeing pain clinic specialist; if patient is interested in pursuing disability, I suggested that she obtain a letter from her treating physician; since I don't manage this condition, I am not comfortable writing a letter stating that she is disabled from this; from my understanding, the state has providers who  do disability evaluations and exams; she will seek a letter from her treating physician if he/she can support her application for disability      Relevant Orders   Amb referral to Pediatric Physical Medicine Rehab   Ambulatory referral to Psychology   Ambulatory referral to Psychiatry     Musculoskeletal and Integument   Lumbar facet syndrome (Bilateral) (Chronic)    Seeing pain specialist      Relevant Orders   Amb referral to Pediatric Physical Medicine Rehab     Other   Long term prescription opiate use (Chronic)    Patient uses opiates chronically, aware that that will limit or restrict some employment options      Relevant Orders   Amb referral to Pediatric Physical Medicine Rehab   Failed back surgical syndrome (Chronic)    Notation by her pain specialist on 12/06/16; she will continue to see him      Relevant Orders   Amb referral to Pediatric Physical Medicine Rehab   Depression (Chronic)    One doctor labeled her as bipolar, but we discussed her seeing a psychiatrist to clarify the diagnosis and to optimize treatment and to psychologist for CBT for diminuition of pain as well through breathing or distraction techniques      Relevant Orders   Ambulatory referral to Psychology   Ambulatory referral to Psychiatry      Follow up plan: No Follow-up on file.  An after-visit summary was printed and given to the patient at [facility].  Please see the patient instructions which may contain other information and recommendations beyond what is mentioned above in the assessment and plan.

## 2017-06-30 NOTE — Assessment & Plan Note (Signed)
Notation by her pain specialist on 12/06/16; she will continue to see him

## 2017-07-08 ENCOUNTER — Ambulatory Visit: Payer: Managed Care, Other (non HMO) | Admitting: Nurse Practitioner

## 2017-07-08 NOTE — Progress Notes (Deleted)
Patient's Name: [name]  MRN: [id number]  Referring Provider: Arnetha Courser, MD  DOB: 1971/06/09  PCP: Arnetha Courser, MD  DOS: 07/08/2017  Note by: Vevelyn Francois NP  Service setting: Ambulatory outpatient  Specialty: Interventional Pain Management  Location: [name] (AMB) Pain Management Facility    Patient type: Established    Primary Reason(s) for Visit: Encounter for prescription drug management. (Level of risk: moderate)  CC: No chief complaint on file.  HPI  [name] is a 47 y.o. year old, female patient, who comes today for a medication management evaluation. She has Adjustment disorder with depressed mood; Stressful life events affecting family and household; Preventative health care; Vitamin D deficiency; Lumbosacral spondylosis without myelopathy; Lumbar central spinal stenosis (L4-5); Breast cancer screening; Bladder irritation; Chronic low back pain (Primary Source of Pain) (midline); Arthralgia of multiple joints; Chronic, continuous use of opioids; Elevated rheumatoid factor; Chronic pain syndrome; Long term (current) use of opiate analgesic; Long term prescription opiate use; Opiate use (85.5 MME/Day); Chronic hip pain (Secondary source of pain) (Left); Failed back surgical syndrome; Discogenic low back pain; Lumbar discogenic pain syndrome; Chronic sacroiliac joint pain (Left); Musculoskeletal pain; Lumbar facet syndrome (Bilateral); Lumbar spondylosis; Neurogenic pain; Depression; and Acute postoperative pain on their problem list. Her primarily concern today is the No chief complaint on file.  Pain Assessment: Location:     Radiating:   Onset:   Duration:   Quality:   Severity:  /10 (self-reported pain score)  Note: Reported level is compatible with observation.                         When using our objective Pain Scale, levels between 6 and 10/10 are said to belong in an emergency room, as it progressively worsens from a 6/10, described as severely limiting, requiring  emergency care not usually available at an outpatient pain management facility. At a 6/10 level, communication becomes difficult and requires great effort. Assistance to reach the emergency department may be required. Facial flushing and profuse sweating along with potentially dangerous increases in heart rate and blood pressure will be evident. Effect on ADL:   Timing:   Modifying factors:    [name] was last scheduled for an appointment on 06/12/2017 for medication management. During today's appointment we reviewed [name]'s chronic pain status, as well as her outpatient medication regimen.  The patient  reports that she does not use drugs. Her body mass index is unknown because there is no height or weight on file.  Further details on both, my assessment(s), as well as the proposed treatment plan, please see below.  Controlled Substance Pharmacotherapy Assessment REMS (Risk Evaluation and Mitigation Strategy)  Analgesic: *** MME/day: *** mg/day.  No notes on file Pharmacokinetics: Liberation and absorption (onset of action): WNL Distribution (time to peak effect): WNL Metabolism and excretion (duration of action): WNL         Pharmacodynamics: Desired effects: Analgesia: [name] reports >50% benefit. Functional ability: Patient reports that medication allows her to accomplish basic ADLs Clinically meaningful improvement in function (CMIF): Sustained CMIF goals met Perceived effectiveness: Described as relatively effective, allowing for increase in activities of daily living (ADL) Undesirable effects: Side-effects or Adverse reactions: None reported Monitoring: [place] PMP: Online review of the past 66-monthperiod conducted. Compliant with practice rules and regulations Last UDS on record: Summary  Date Value Ref Range Status  04/14/2017 FINAL  Final    Comment:    ====================================================================  TOXAS[employer] SELECT 13  (MW) ==================================================================== Test                             Result       Flag       Units Drug Present and Declared for Prescription Verification   Oxycodone                      645          EXPECTED   ng/mg creat   Oxymorphone                    1345         EXPECTED   ng/mg creat   Noroxycodone                   3037         EXPECTED   ng/mg creat   Noroxymorphone                 425          EXPECTED   ng/mg creat    Sources of oxycodone are scheduled prescription medications.    Oxymorphone, noroxycodone, and noroxymorphone are expected    metabolites of oxycodone. Oxymorphone is also available as a    scheduled prescription medication. ==================================================================== Test                      Result    Flag   Units      Ref Range   Creatinine              137              mg/dL      >=20 ==================================================================== Declared Medications:  The flagging and interpretation on this report are based on the  following declared medications.  Unexpected results may arise from  inaccuracies in the declared medications.  **Note: The testing scope of this panel includes these medications:  Oxycodone  **Note: The testing scope of this panel does not include following  reported medications:  Cyclobenzaprine (Flexeril)  Duloxetine (Cymbalta)  Gabapentin  Meloxicam (Mobic) ==================================================================== For clinical consultation, please call [phone]. ====================================================================    UDS interpretation: Compliant          Medication Assessment Form: Reviewed. Patient indicates being compliant with therapy Treatment compliance: Compliant Risk Assessment Profile: Aberrant behavior: See prior evaluations. None observed or detected today Comorbid factors increasing risk of overdose: See  prior notes. No additional risks detected today Risk of substance use disorder (SUD): Low Opioid Risk Tool - 05/12/17 1117      Family History of Substance Abuse   Alcohol  Negative    Illegal Drugs  Negative    Rx Drugs  Negative      Personal History of Substance Abuse   Alcohol  Negative    Illegal Drugs  Negative    Rx Drugs  Negative      Age   Age between 62-45 years   No      History of Preadolescent Sexual Abuse   History of Preadolescent Sexual Abuse  Negative or Female      Psychological Disease   Psychological Disease  Negative    ADD  Negative    OCD  Negative    Bipolar  Negative    Schizophrenia  Negative    Depression  Negative  Total Score   Opioid Risk Tool Scoring  0    Opioid Risk Interpretation  Low Risk      ORT Scoring interpretation table:  Score <3 = Low Risk for SUD  Score between 4-7 = Moderate Risk for SUD  Score >8 = High Risk for Opioid Abuse   Risk Mitigation Strategies:  Patient Counseling: Covered Patient-Prescriber Agreement (PPA): Present and active  Notification to other healthcare providers: Done  Pharmacologic Plan: No change in therapy, at this time.             Laboratory Chemistry  Inflammation Markers (CRP: Acute Phase) (ESR: Chronic Phase) Lab Results  Component Value Date   CRP 5.3 (H) 12/05/2016   ESRSEDRATE 9 12/05/2016                 Rheumatology Markers Lab Results  Component Value Date   RF 17.9 (H) 12/05/2016   ANA NEG 10/02/2016   LABURIC 2.5 09/18/2016                Renal Function Markers Lab Results  Component Value Date   BUN 17 12/05/2016   CREATININE 0.84 12/05/2016   GFRAA 96 12/05/2016   GFRNONAA 83 12/05/2016                 Hepatic Function Markers Lab Results  Component Value Date   AST 21 12/05/2016   ALT 20 12/05/2016   ALBUMIN 4.4 12/05/2016   ALKPHOS 59 12/05/2016   LIPASE 107 12/26/2011                 Electrolytes Lab Results  Component Value Date   NA 139  12/05/2016   K 4.6 12/05/2016   CL 103 12/05/2016   CALCIUM 9.5 12/05/2016   MG 2.1 12/05/2016   PHOS 3.0 09/18/2016                 Neuropathy Markers Lab Results  Component Value Date   VITAMINB12 495 12/05/2016                 Bone Pathology Markers Lab Results  Component Value Date   VD25OH 27.1 (L) 09/18/2016   25OHVITD1 49 12/05/2016   25OHVITD2 <1.0 12/05/2016   25OHVITD3 49 12/05/2016                 Coagulation Parameters Lab Results  Component Value Date   INR 0.9 04/16/2013   LABPROT 12.4 04/16/2013   PLT 287 10/02/2016                 Cardiovascular Markers Lab Results  Component Value Date   CKTOTAL 75 04/16/2013   CKMB 0.6 04/16/2013   TROPONINI < 0.02 04/16/2013   HGB 13.3 10/02/2016   HCT 40.8 10/02/2016                 CA Markers No results found for: CEA, CA125, LABCA2               Note: Lab results reviewed.  Recent Diagnostic Imaging Results  DG C-Arm 1-60 Min-No Report Fluoroscopy was utilized by the requesting physician.  No radiographic  interpretation.   Complexity Note: Imaging results reviewed. Results shared with Ms. Dejoseph, using Layman's terms.                         Meds   Current Outpatient Medications:  .  [START ON 07/23/2017] cyclobenzaprine (FLEXERIL) 10 MG tablet,  Take 1 tablet (10 mg total) by mouth at bedtime., Disp: 30 tablet, Rfl: 0 .  [START ON 07/23/2017] DULoxetine (CYMBALTA) 30 MG capsule, Take 1 capsule (30 mg total) by mouth 2 (two) times daily., Disp: 60 capsule, Rfl: 0 .  [START ON 07/23/2017] gabapentin (NEURONTIN) 300 MG capsule, Take 1 capsule (300 mg total) by mouth every 8 (eight) hours., Disp: 90 capsule, Rfl: 0 .  [START ON 07/23/2017] meloxicam (MOBIC) 15 MG tablet, Take 1 tablet (15 mg total) by mouth daily., Disp: 30 tablet, Rfl: 0 .  [START ON 07/23/2017] oxyCODONE-acetaminophen (PERCOCET) 10-325 MG tablet, Take 1 tablet by mouth every 6 (six) hours as needed for pain., Disp: 120 tablet, Rfl: 0  ROS   Constitutional: Denies any fever or chills Gastrointestinal: No reported hemesis, hematochezia, vomiting, or acute GI distress Musculoskeletal: Denies any acute onset joint swelling, redness, loss of ROM, or weakness Neurological: No reported episodes of acute onset apraxia, aphasia, dysarthria, agnosia, amnesia, paralysis, loss of coordination, or loss of consciousness  Allergies  Ms. Dignan is allergic to codeine.  PFSH  Drug: Ms. Lacross  reports that she does not use drugs. Alcohol:  reports that she does not drink alcohol. Tobacco:  reports that she has been smoking cigarettes.  She has a 20.00 pack-year smoking history. she has never used smokeless tobacco. Medical:  has a past medical history of Allergy, Anxiety, Chronic bilateral low back pain (07/22/2016), and Depression. Surgical: Ms. Hogston  has a past surgical history that includes Abdominal hysterectomy; Tonsillectomy; Cesarean section; Spine surgery; Spine surgery (09/09/2015); and Back surgery. Family: family history includes Arthritis in her father; Cancer in her maternal aunt; Depression in her sister and sister; Diabetes in her mother; Heart disease in her father and maternal grandfather; Hyperlipidemia in her father; Hypertension in her father and mother; Lung disease in her father; Lymphoma in her father.  Constitutional Exam  General appearance: Well nourished, well developed, and well hydrated. In no apparent acute distress There were no vitals filed for this visit. BMI Assessment: Estimated body mass index is 29.67 kg/m as calculated from the following:   Height as of 06/26/17: _0  (1.676 m).   Weight as of 06/30/17: 183 lb 12.8 oz (83.4 kg).  BMI interpretation table: BMI level Category Range association with higher incidence of chronic pain  <18 kg/m2 Underweight   18.5-24.9 kg/m2 Ideal body weight   25-29.9 kg/m2 Overweight Increased incidence by 20%  30-34.9 kg/m2 Obese (Class I) Increased incidence by 68%  35-39.9  kg/m2 Severe obesity (Class II) Increased incidence by 136%  >40 kg/m2 Extreme obesity (Class III) Increased incidence by 254%   BMI Readings from Last 4 Encounters:  06/30/17 29.67 kg/m  06/26/17 29.05 kg/m  06/12/17 29.05 kg/m  05/12/17 29.05 kg/m   Wt Readings from Last 4 Encounters:  06/30/17 183 lb 12.8 oz (83.4 kg)  06/26/17 180 lb (81.6 kg)  06/12/17 180 lb (81.6 kg)  05/12/17 180 lb (81.6 kg)  Psych/Mental status: Alert, oriented x 3 (person, place, & time)       Eyes: PERLA Respiratory: No evidence of acute respiratory distress  Cervical Spine Area Exam  Skin & Axial Inspection: No masses, redness, edema, swelling, or associated skin lesions Alignment: Symmetrical Functional ROM: Unrestricted ROM      Stability: No instability detected Muscle Tone/Strength: Functionally intact. No obvious neuro-muscular anomalies detected. Sensory (Neurological): Unimpaired Palpation: No palpable anomalies              Upper Extremity (  UE) Exam    Side: Right upper extremity  Side: Left upper extremity  Skin & Extremity Inspection: Skin color, temperature, and hair growth are WNL. No peripheral edema or cyanosis. No masses, redness, swelling, asymmetry, or associated skin lesions. No contractures.  Skin & Extremity Inspection: Skin color, temperature, and hair growth are WNL. No peripheral edema or cyanosis. No masses, redness, swelling, asymmetry, or associated skin lesions. No contractures.  Functional ROM: Unrestricted ROM          Functional ROM: Unrestricted ROM          Muscle Tone/Strength: Functionally intact. No obvious neuro-muscular anomalies detected.  Muscle Tone/Strength: Functionally intact. No obvious neuro-muscular anomalies detected.  Sensory (Neurological): Unimpaired          Sensory (Neurological): Unimpaired          Palpation: No palpable anomalies              Palpation: No palpable anomalies              Specialized Test(s): Deferred         Specialized Test(s):  Deferred          Thoracic Spine Area Exam  Skin & Axial Inspection: No masses, redness, or swelling Alignment: Symmetrical Functional ROM: Unrestricted ROM Stability: No instability detected Muscle Tone/Strength: Functionally intact. No obvious neuro-muscular anomalies detected. Sensory (Neurological): Unimpaired Muscle strength & Tone: No palpable anomalies  Lumbar Spine Area Exam  Skin & Axial Inspection: No masses, redness, or swelling Alignment: Symmetrical Functional ROM: Unrestricted ROM      Stability: No instability detected Muscle Tone/Strength: Functionally intact. No obvious neuro-muscular anomalies detected. Sensory (Neurological): Unimpaired Palpation: No palpable anomalies       Provocative Tests: Lumbar Hyperextension and rotation test: evaluation deferred today       Lumbar Lateral bending test: evaluation deferred today       Patrick's Maneuver: evaluation deferred today                    Gait & Posture Assessment  Ambulation: Unassisted Gait: Relatively normal for age and body habitus Posture: WNL   Lower Extremity Exam    Side: Right lower extremity  Side: Left lower extremity  Skin & Extremity Inspection: Skin color, temperature, and hair growth are WNL. No peripheral edema or cyanosis. No masses, redness, swelling, asymmetry, or associated skin lesions. No contractures.  Skin & Extremity Inspection: Skin color, temperature, and hair growth are WNL. No peripheral edema or cyanosis. No masses, redness, swelling, asymmetry, or associated skin lesions. No contractures.  Functional ROM: Unrestricted ROM          Functional ROM: Unrestricted ROM          Muscle Tone/Strength: Functionally intact. No obvious neuro-muscular anomalies detected.  Muscle Tone/Strength: Functionally intact. No obvious neuro-muscular anomalies detected.  Sensory (Neurological): Unimpaired  Sensory (Neurological): Unimpaired  Palpation: No palpable anomalies  Palpation: No palpable  anomalies   Assessment  Primary Diagnosis & Pertinent Problem List: There were no encounter diagnoses.  Status Diagnosis  Controlled Controlled Controlled No diagnosis found.  Problems updated and reviewed during this visit: No problems updated. Plan of Care  Pharmacotherapy (Medications Ordered): No orders of the defined types were placed in this encounter.  New Prescriptions   No medications on file   Medications administered today: Melek Pownall. Mahn had no medications administered during this visit. Lab-work, procedure(s), and/or referral(s): No orders of the defined types were placed in this encounter.  Imaging and/or referral(s): None  Interventional therapies: Planned, scheduled, and/or pending:   Not at this time.   Considering:   ***   Palliative PRN treatment(s):   ***   Provider-requested follow-up: No Follow-up on file.  Future Appointments  Date Time Provider [place]  07/08/2017 11:30 AM Vevelyn Francois, NP [name] None  07/18/2017 10:20 AM Arnetha Courser, MD [facility]  07/21/2017  8:15 AM Milinda Pointer, MD [facility] None   Primary Care Physician: Arnetha Courser, MD Location: [facility] Facility Note by: Vevelyn Francois NP Date: 07/08/2017; Time: 11:23 AM  Pain Score Disclaimer: We use the NRS-11 scale. This is a self-reported, subjective measurement of pain severity with only modest accuracy. It is used primarily to identify changes within a particular patient. It must be understood that outpatient pain scales are significantly less accurate that those used for research, where they can be applied under ideal controlled circumstances with minimal exposure to variables. In reality, the score is likely to be a combination of pain intensity and pain affect, where pain affect describes the degree of emotional arousal or changes in action readiness caused by the sensory experience of pain. Factors such as social and work  situation, setting, emotional state, anxiety levels, expectation, and prior pain experience may influence pain perception and show large inter-individual differences that may also be affected by time variables.  Patient instructions provided during this appointment: There are no Patient Instructions on file for this visit.

## 2017-07-18 ENCOUNTER — Encounter: Payer: Managed Care, Other (non HMO) | Admitting: Family Medicine

## 2017-07-21 ENCOUNTER — Ambulatory Visit: Payer: Self-pay | Admitting: Family Medicine

## 2017-07-21 ENCOUNTER — Encounter: Payer: Self-pay | Admitting: Family Medicine

## 2017-07-21 ENCOUNTER — Encounter: Payer: Self-pay | Admitting: Pain Medicine

## 2017-07-21 ENCOUNTER — Ambulatory Visit: Payer: Managed Care, Other (non HMO) | Attending: Pain Medicine | Admitting: Pain Medicine

## 2017-07-21 VITALS — BP 134/81 | HR 90 | Temp 98.7°F | Resp 17

## 2017-07-21 VITALS — BP 135/95 | HR 76 | Temp 98.4°F | Resp 16 | Ht 66.0 in | Wt 180.0 lb

## 2017-07-21 DIAGNOSIS — M549 Dorsalgia, unspecified: Secondary | ICD-10-CM | POA: Diagnosis not present

## 2017-07-21 DIAGNOSIS — Z807 Family history of other malignant neoplasms of lymphoid, hematopoietic and related tissues: Secondary | ICD-10-CM | POA: Insufficient documentation

## 2017-07-21 DIAGNOSIS — M533 Sacrococcygeal disorders, not elsewhere classified: Secondary | ICD-10-CM

## 2017-07-21 DIAGNOSIS — J0100 Acute maxillary sinusitis, unspecified: Secondary | ICD-10-CM

## 2017-07-21 DIAGNOSIS — M961 Postlaminectomy syndrome, not elsewhere classified: Secondary | ICD-10-CM | POA: Diagnosis not present

## 2017-07-21 DIAGNOSIS — Z8249 Family history of ischemic heart disease and other diseases of the circulatory system: Secondary | ICD-10-CM | POA: Insufficient documentation

## 2017-07-21 DIAGNOSIS — Z8261 Family history of arthritis: Secondary | ICD-10-CM | POA: Diagnosis not present

## 2017-07-21 DIAGNOSIS — M25552 Pain in left hip: Secondary | ICD-10-CM

## 2017-07-21 DIAGNOSIS — E559 Vitamin D deficiency, unspecified: Secondary | ICD-10-CM | POA: Insufficient documentation

## 2017-07-21 DIAGNOSIS — Z90710 Acquired absence of both cervix and uterus: Secondary | ICD-10-CM | POA: Insufficient documentation

## 2017-07-21 DIAGNOSIS — R05 Cough: Secondary | ICD-10-CM

## 2017-07-21 DIAGNOSIS — M48061 Spinal stenosis, lumbar region without neurogenic claudication: Secondary | ICD-10-CM | POA: Insufficient documentation

## 2017-07-21 DIAGNOSIS — M47816 Spondylosis without myelopathy or radiculopathy, lumbar region: Secondary | ICD-10-CM

## 2017-07-21 DIAGNOSIS — Z79891 Long term (current) use of opiate analgesic: Secondary | ICD-10-CM | POA: Diagnosis not present

## 2017-07-21 DIAGNOSIS — Z818 Family history of other mental and behavioral disorders: Secondary | ICD-10-CM | POA: Diagnosis not present

## 2017-07-21 DIAGNOSIS — M545 Low back pain: Secondary | ICD-10-CM | POA: Diagnosis present

## 2017-07-21 DIAGNOSIS — M7918 Myalgia, other site: Secondary | ICD-10-CM | POA: Diagnosis not present

## 2017-07-21 DIAGNOSIS — G8929 Other chronic pain: Secondary | ICD-10-CM | POA: Diagnosis not present

## 2017-07-21 DIAGNOSIS — F4321 Adjustment disorder with depressed mood: Secondary | ICD-10-CM | POA: Diagnosis not present

## 2017-07-21 DIAGNOSIS — G894 Chronic pain syndrome: Secondary | ICD-10-CM | POA: Diagnosis not present

## 2017-07-21 DIAGNOSIS — Z885 Allergy status to narcotic agent status: Secondary | ICD-10-CM | POA: Insufficient documentation

## 2017-07-21 DIAGNOSIS — F17210 Nicotine dependence, cigarettes, uncomplicated: Secondary | ICD-10-CM | POA: Insufficient documentation

## 2017-07-21 DIAGNOSIS — M488X6 Other specified spondylopathies, lumbar region: Secondary | ICD-10-CM | POA: Insufficient documentation

## 2017-07-21 DIAGNOSIS — Z833 Family history of diabetes mellitus: Secondary | ICD-10-CM | POA: Insufficient documentation

## 2017-07-21 DIAGNOSIS — Z836 Family history of other diseases of the respiratory system: Secondary | ICD-10-CM | POA: Insufficient documentation

## 2017-07-21 DIAGNOSIS — M47817 Spondylosis without myelopathy or radiculopathy, lumbosacral region: Secondary | ICD-10-CM | POA: Diagnosis not present

## 2017-07-21 DIAGNOSIS — F419 Anxiety disorder, unspecified: Secondary | ICD-10-CM | POA: Insufficient documentation

## 2017-07-21 DIAGNOSIS — J029 Acute pharyngitis, unspecified: Secondary | ICD-10-CM

## 2017-07-21 DIAGNOSIS — M5442 Lumbago with sciatica, left side: Secondary | ICD-10-CM

## 2017-07-21 DIAGNOSIS — R059 Cough, unspecified: Secondary | ICD-10-CM

## 2017-07-21 LAB — POCT RAPID STREP A (OFFICE): Rapid Strep A Screen: NEGATIVE

## 2017-07-21 MED ORDER — ROPIVACAINE HCL 2 MG/ML IJ SOLN
4.0000 mL | Freq: Once | INTRAMUSCULAR | Status: AC
  Administered 2017-07-21: 9 mL

## 2017-07-21 MED ORDER — TRIAMCINOLONE ACETONIDE 40 MG/ML IJ SUSP
INTRAMUSCULAR | Status: AC
  Filled 2017-07-21: qty 1

## 2017-07-21 MED ORDER — AMOXICILLIN-POT CLAVULANATE 875-125 MG PO TABS: 1.0000 | ORAL_TABLET | Freq: Two times a day (BID) | ORAL | 0 refills | Status: DC

## 2017-07-21 MED ORDER — ROPIVACAINE HCL 2 MG/ML IJ SOLN
INTRAMUSCULAR | Status: AC
  Filled 2017-07-21: qty 10

## 2017-07-21 MED ORDER — TRIAMCINOLONE ACETONIDE 40 MG/ML IJ SUSP
40.0000 mg | Freq: Once | INTRAMUSCULAR | Status: AC
  Administered 2017-07-21: 40 mg

## 2017-07-21 NOTE — Patient Instructions (Signed)

## 2017-07-21 NOTE — Progress Notes (Signed)
Patient's Name: [name]  MRN: [id number]  Referring Provider: Arnetha Courser, MD  DOB: [birth year]  PCP: Arnetha Courser, MD  DOS: 07/21/2017  Note by: Gaspar Cola, MD  Service setting: Ambulatory outpatient  Specialty: Interventional Pain Management  Location: [name] (AMB) Pain Management Facility    Patient type: Established   Primary Reason(s) for Visit: Encounter for post-procedure evaluation of chronic illness with mild to moderate exacerbation CC: Back Pain (lower bilateral)  Procedure:       Anesthesia, Analgesia, Anxiolysis:  Type: Trigger Point Injection Level: PSIS Laterality: Left Position: Supine Target Muscle: Quadratus lumborum muscle and erector spinae muscle Approach: Posterior percutaneous approach Region: Posterior  Lumbar area. Primary Purpose: Diagnostic/therapeutic  Type: Local Anesthesia Local Anesthetic: Lidocaine 1% Route: Infiltration ([place]/IM) IV Access: Declined Sedation: Declined  Indication(s): Analgesia           Indications: 1. Chronic low back pain (Primary Source of Pain) (midline)   2. Lumbar facet syndrome (Bilateral)   3. Chronic hip pain (Secondary source of pain) (Left)   4. Chronic sacroiliac joint pain (Left)   5. Chronic pain syndrome   6. Trigger point with back pain (Left)   7. Chronic musculoskeletal pain    Pain Score: Pre-procedure: 3 /10 Post-procedure: 1 /10  HPI  [name] is a 47 y.o. year old, female patient, who comes today for a post-procedure evaluation. She has Adjustment disorder with depressed mood; Stressful life events affecting family and household; Preventative health care; Vitamin D deficiency; Lumbosacral spondylosis without myelopathy; Lumbar central spinal stenosis (L4-5); Breast cancer screening; Bladder irritation; Chronic low back pain (Primary Source of Pain) (midline); Arthralgia of multiple joints; Chronic, continuous use of opioids; Elevated rheumatoid factor; Chronic pain syndrome; Long term (current)  use of opiate analgesic; Long term prescription opiate use; Opiate use (85.5 MME/Day); Chronic hip pain (Secondary source of pain) (Left); Failed back surgical syndrome; Discogenic low back pain; Lumbar discogenic pain syndrome; Chronic sacroiliac joint pain (Left); Chronic musculoskeletal pain; Lumbar facet syndrome (Bilateral); Lumbar spondylosis; Neurogenic pain; Depression; Acute postoperative pain; and Trigger point with back pain (Left) on their problem list. Her primarily concern today is the Back Pain (lower bilateral)  The patient seems to have done extremely well after the right lumbar facet radiofrequency done in January.  After the radiofrequency of the lumbar facets on the left side, done in November, she also obtain excellent relief of the pain but now is experiencing some recurrence in the area of the left PSIS.  This pain is very localized to the erector spina muscle and quadratus lumborum muscle region, medial to the PSIS on the left side.  Physical exam suggests the pain to be myofascial in origin and therefore today we have decided to go with a trigger point injection for diagnostic purposes.  In addition, the patient has asked me about all the things that can be done for this low back pain and I have provided her with information regarding the spinal cord stimulators.  She also asked about disability and I also provided her with information regarding that.  Pain Assessment: Location: Lower, Left, Right Back Radiating: na Onset: More than a month ago Duration: Chronic pain Quality: Discomfort, Constant, Stabbing(left side) Severity: 3 /10 (self-reported pain score)  Note: Reported level is compatible with observation.                         When using our objective Pain Scale, levels between  6 and 10/10 are said to belong in an emergency room, as it progressively worsens from a 6/10, described as severely limiting, requiring emergency care not usually available at an outpatient pain  management facility. At a 6/10 level, communication becomes difficult and requires great effort. Assistance to reach the emergency department may be required. Facial flushing and profuse sweating along with potentially dangerous increases in heart rate and blood pressure will be evident. Effect on ADL: not functioning very well.  does what she has to do to get by Timing: Constant Modifying factors: procedure helped both sides x 2 weeks, currently the left side is starting to hurt again, different than prior to procedure.   [name] comes in today for post-procedure evaluation after the treatment done on 06/26/2017.  Further details on both, my assessment(s), as well as the proposed treatment plan, please see below.  Post-Procedure Assessment  06/26/2017 Procedure: Therapeutic right-sided lumbar facet radiofrequency ablation #1 under fluoroscopic guidance and IV sedation Pre-procedure pain score:  4/10 Post-procedure pain score: 0/10 (100% relief) Influential Factors: BMI: 29.05 kg/m Intra-procedural challenges: None observed.         Assessment challenges: None detected.              Reported side-effects: None.        Post-procedural adverse reactions or complications: None reported         Sedation: Sedation provided. When no sedatives are used, the analgesic levels obtained are directly associated to the effectiveness of the local anesthetics. However, when sedation is provided, the level of analgesia obtained during the initial 1 hour following the intervention, is believed to be the result of a combination of factors. These factors may include, but are not limited to: 1. The effectiveness of the local anesthetics used. 2. The effects of the analgesic(s) and/or anxiolytic(s) used. 3. The degree of discomfort experienced by the patient at the time of the procedure. 4. The patients ability and reliability in recalling and recording the events. 5. The presence and influence of possible  secondary gains and/or psychosocial factors. Reported result: Relief experienced during the 1st hour after the procedure: 100 % (Ultra-Short Term Relief)            Interpretative annotation: Clinically appropriate result. Analgesia during this period is likely to be Local Anesthetic and/or IV Sedative (Analgesic/Anxiolytic) related.          Effects of local anesthetic: The analgesic effects attained during this period are directly associated to the localized infiltration of local anesthetics and therefore cary significant diagnostic value as to the etiological location, or anatomical origin, of the pain. Expected duration of relief is directly dependent on the pharmacodynamics of the local anesthetic used. Long-acting (4-6 hours) anesthetics used.  Reported result: Relief during the next 4 to 6 hour after the procedure: 100 % (Short-Term Relief)            Interpretative annotation: Clinically appropriate result. Analgesia during this period is likely to be Local Anesthetic-related.          Long-term benefit: Defined as the period of time past the expected duration of local anesthetics (1 hour for short-acting and 4-6 hours for long-acting). With the possible exception of prolonged sympathetic blockade from the local anesthetics, benefits during this period are typically attributed to, or associated with, other factors such as analgesic sensory neuropraxia, antiinflammatory effects, or beneficial biochemical changes provided by agents other than the local anesthetics.  Reported result: Extended relief following procedure: 60 % (Long-Term  Relief)            Interpretative annotation: Clinically appropriate result. Good relief. No permanent benefit expected.    Benefit could signal adequate RF ablation.  Current benefits: Defined as reported results that persistent at this point in time.   Analgesia: 50-75 % Ms. Kashuba reports improvement of axial symptoms. Function: Ms. Tatsch reports improvement in  function ROM: Ms. Stang reports improvement in ROM Interpretative annotation: Ongoing benefit. Therapeutic benefit observed. Adequate RF ablation. Benefit could signal adequate RF ablation.  Interpretation: Results would suggest a successful therapeutic intervention.                  Plan:  Set up procedure as a PRN palliative treatment option for this patient.        Laboratory Chemistry  Inflammation Markers (CRP: Acute Phase) (ESR: Chronic Phase) Lab Results  Component Value Date   CRP 5.3 (H) 12/05/2016   ESRSEDRATE 9 12/05/2016                 Rheumatology Markers Lab Results  Component Value Date   RF 17.9 (H) 12/05/2016   ANA NEG 10/02/2016   LABURIC 2.5 09/18/2016                Renal Function Markers Lab Results  Component Value Date   BUN 17 12/05/2016   CREATININE 0.84 12/05/2016   GFRAA 96 12/05/2016   GFRNONAA 83 12/05/2016                 Hepatic Function Markers Lab Results  Component Value Date   AST 21 12/05/2016   ALT 20 12/05/2016   ALBUMIN 4.4 12/05/2016   ALKPHOS 59 12/05/2016   LIPASE 107 12/26/2011                 Electrolytes Lab Results  Component Value Date   NA 139 12/05/2016   K 4.6 12/05/2016   CL 103 12/05/2016   CALCIUM 9.5 12/05/2016   MG 2.1 12/05/2016   PHOS 3.0 09/18/2016                 Neuropathy Markers Lab Results  Component Value Date   VITAMINB12 495 12/05/2016                 Bone Pathology Markers Lab Results  Component Value Date   VD25OH 27.1 (L) 09/18/2016   25OHVITD1 49 12/05/2016   25OHVITD2 <1.0 12/05/2016   25OHVITD3 49 12/05/2016                 Coagulation Parameters Lab Results  Component Value Date   INR 0.9 04/16/2013   LABPROT 12.4 04/16/2013   PLT 287 10/02/2016                 Cardiovascular Markers Lab Results  Component Value Date   CKTOTAL 75 04/16/2013   CKMB 0.6 04/16/2013   TROPONINI < 0.02 04/16/2013   HGB 13.3 10/02/2016   HCT 40.8 10/02/2016                 Note:  Lab results reviewed.  Recent Diagnostic Imaging Results  DG C-Arm 1-60 Min-No Report Fluoroscopy was utilized by the requesting physician.  No radiographic  interpretation.   Complexity Note: Imaging results reviewed. Results shared with Ms. Hissong, using Layman's terms.                         Meds  Current Outpatient Medications:  .  [START ON 07/23/2017] cyclobenzaprine (FLEXERIL) 10 MG tablet, Take 1 tablet (10 mg total) by mouth at bedtime., Disp: 30 tablet, Rfl: 0 .  [START ON 07/23/2017] DULoxetine (CYMBALTA) 30 MG capsule, Take 1 capsule (30 mg total) by mouth 2 (two) times daily., Disp: 60 capsule, Rfl: 0 .  [START ON 07/23/2017] gabapentin (NEURONTIN) 300 MG capsule, Take 1 capsule (300 mg total) by mouth every 8 (eight) hours., Disp: 90 capsule, Rfl: 0 .  [START ON 07/23/2017] meloxicam (MOBIC) 15 MG tablet, Take 1 tablet (15 mg total) by mouth daily., Disp: 30 tablet, Rfl: 0 .  [START ON 07/23/2017] oxyCODONE-acetaminophen (PERCOCET) 10-325 MG tablet, Take 1 tablet by mouth every 6 (six) hours as needed for pain., Disp: 120 tablet, Rfl: 0  ROS  Constitutional: Denies any fever or chills Gastrointestinal: No reported hemesis, hematochezia, vomiting, or acute GI distress Musculoskeletal: Denies any acute onset joint swelling, redness, loss of ROM, or weakness Neurological: No reported episodes of acute onset apraxia, aphasia, dysarthria, agnosia, amnesia, paralysis, loss of coordination, or loss of consciousness  Allergies  Ms. Bench is allergic to codeine.  PFSH  Drug: Ms. Birkeland  reports that she does not use drugs. Alcohol:  reports that she does not drink alcohol. Tobacco:  reports that she has been smoking cigarettes.  She has a 20.00 pack-year smoking history. she has never used smokeless tobacco. Medical:  has a past medical history of Allergy, Anxiety, Chronic bilateral low back pain (07/22/2016), and Depression. Surgical: Ms. Awe  has a past surgical history that includes  Abdominal hysterectomy; Tonsillectomy; Cesarean section; Spine surgery; Spine surgery (09/09/2015); and Back surgery. Family: family history includes Arthritis in her father; Cancer in her maternal aunt; Depression in her sister and sister; Diabetes in her mother; Heart disease in her father and maternal grandfather; Hyperlipidemia in her father; Hypertension in her father and mother; Lung disease in her father; Lymphoma in her father.  Constitutional Exam  General appearance: Well nourished, well developed, and well hydrated. In no apparent acute distress Vitals:   07/21/17 0836 07/21/17 0930  BP: (!) 132/91 (!) 135/95  Pulse: 79 76  Resp: 16 16  Temp: 98.4 F (36.9 C)   TempSrc: Oral   SpO2: 100% 99%  Weight: 180 lb (81.6 kg)   Height: 5' 6"  (1.676 m)    BMI Assessment: Estimated body mass index is 29.05 kg/m as calculated from the following:   Height as of this encounter: 5' 6"  (1.676 m).   Weight as of this encounter: 180 lb (81.6 kg).  BMI interpretation table: BMI level Category Range association with higher incidence of chronic pain  <18 kg/m2 Underweight   18.5-24.9 kg/m2 Ideal body weight   25-29.9 kg/m2 Overweight Increased incidence by 20%  30-34.9 kg/m2 Obese (Class I) Increased incidence by 68%  35-39.9 kg/m2 Severe obesity (Class II) Increased incidence by 136%  >40 kg/m2 Extreme obesity (Class III) Increased incidence by 254%   BMI Readings from Last 4 Encounters:  07/21/17 29.05 kg/m  06/30/17 29.67 kg/m  06/26/17 29.05 kg/m  06/12/17 29.05 kg/m   Wt Readings from Last 4 Encounters:  07/21/17 180 lb (81.6 kg)  06/30/17 183 lb 12.8 oz (83.4 kg)  06/26/17 180 lb (81.6 kg)  06/12/17 180 lb (81.6 kg)  Psych/Mental status: Alert, oriented x 3 (person, place, & time)       Eyes: PERLA Respiratory: No evidence of acute respiratory distress  Cervical Spine Area Exam  Skin &  Axial Inspection: No masses, redness, edema, swelling, or associated skin  lesions Alignment: Symmetrical Functional ROM: Unrestricted ROM      Stability: No instability detected Muscle Tone/Strength: Functionally intact. No obvious neuro-muscular anomalies detected. Sensory (Neurological): Unimpaired Palpation: No palpable anomalies              Upper Extremity (UE) Exam    Side: Right upper extremity  Side: Left upper extremity  Skin & Extremity Inspection: Skin color, temperature, and hair growth are WNL. No peripheral edema or cyanosis. No masses, redness, swelling, asymmetry, or associated skin lesions. No contractures.  Skin & Extremity Inspection: Skin color, temperature, and hair growth are WNL. No peripheral edema or cyanosis. No masses, redness, swelling, asymmetry, or associated skin lesions. No contractures.  Functional ROM: Unrestricted ROM          Functional ROM: Unrestricted ROM          Muscle Tone/Strength: Functionally intact. No obvious neuro-muscular anomalies detected.  Muscle Tone/Strength: Functionally intact. No obvious neuro-muscular anomalies detected.  Sensory (Neurological): Unimpaired          Sensory (Neurological): Unimpaired          Palpation: No palpable anomalies              Palpation: No palpable anomalies              Specialized Test(s): Deferred         Specialized Test(s): Deferred          Thoracic Spine Area Exam  Skin & Axial Inspection: No masses, redness, or swelling Alignment: Symmetrical Functional ROM: Unrestricted ROM Stability: No instability detected Muscle Tone/Strength: Functionally intact. No obvious neuro-muscular anomalies detected. Sensory (Neurological): Unimpaired Muscle strength & Tone: No palpable anomalies  Lumbar Spine Area Exam  Skin & Axial Inspection: No masses, redness, or swelling Alignment: Symmetrical Functional ROM: Diminished ROM      Stability: No instability detected Muscle Tone/Strength: Functionally intact. No obvious neuro-muscular anomalies detected. Sensory (Neurological):  Movement-associated pain On the left lower back close to the PSIS area Palpation: Trigger Point Medial to the PSIS area, over the quadratus lumborum and erector spinae muscle. Provocative Tests: Lumbar Hyperextension and rotation test: Improved after treatment Bilaterally Lumbar Lateral bending test: evaluation deferred today       Patrick's Maneuver: Negative for bilateral S-I arthralgia and for bilateral hip arthralgia  Gait & Posture Assessment  Ambulation: Unassisted Gait: Relatively normal for age and body habitus Posture: WNL   Lower Extremity Exam    Side: Right lower extremity  Side: Left lower extremity  Skin & Extremity Inspection: Skin color, temperature, and hair growth are WNL. No peripheral edema or cyanosis. No masses, redness, swelling, asymmetry, or associated skin lesions. No contractures.  Skin & Extremity Inspection: Skin color, temperature, and hair growth are WNL. No peripheral edema or cyanosis. No masses, redness, swelling, asymmetry, or associated skin lesions. No contractures.  Functional ROM: Unrestricted ROM          Functional ROM: Unrestricted ROM          Muscle Tone/Strength: Functionally intact. No obvious neuro-muscular anomalies detected.  Muscle Tone/Strength: Functionally intact. No obvious neuro-muscular anomalies detected.  Sensory (Neurological): Unimpaired  Sensory (Neurological): Unimpaired  Palpation: No palpable anomalies  Palpation: No palpable anomalies   Pre-op Assessment:  [name] is a 47 y.o. (year old), female patient, seen today for interventional treatment. She  has a past surgical history that includes Abdominal hysterectomy; Tonsillectomy; Cesarean section; Spine surgery;  Spine surgery (09/09/2015); and Back surgery. Ms. Handyside has a current medication list which includes the following prescription(s): cyclobenzaprine, duloxetine, gabapentin, meloxicam, and oxycodone-acetaminophen. Her primarily concern today is the Back Pain (lower  bilateral)  Initial Vital Signs:  Pulse Rate: 79 Temp: 98.4 F (36.9 C) Resp: 16 BP: (!) 132/91 SpO2: 100 %  BMI: Estimated body mass index is 29.05 kg/m as calculated from the following:   Height as of this encounter: 5' 6"  (1.676 m).   Weight as of this encounter: 180 lb (81.6 kg).  Risk Assessment: Allergies: Reviewed. She is allergic to codeine.  Allergy Precautions: None required Coagulopathies: Reviewed. None identified.  Blood-thinner therapy: None at this time Active Infection(s): Reviewed. None identified. [name] is afebrile  Site Confirmation: Ms. Wigley was asked to confirm the procedure and laterality before marking the site Procedure checklist: Completed Consent: Before the procedure and under the influence of no sedative(s), amnesic(s), or anxiolytics, the patient was informed of the treatment options, risks and possible complications. To fulfill our ethical and legal obligations, as recommended by the American Medical Association's Code of Ethics, I have informed the patient of my clinical impression; the nature and purpose of the treatment or procedure; the risks, benefits, and possible complications of the intervention; the alternatives, including doing nothing; the risk(s) and benefit(s) of the alternative treatment(s) or procedure(s); and the risk(s) and benefit(s) of doing nothing. The patient was provided information about the general risks and possible complications associated with the procedure. These may include, but are not limited to: failure to achieve desired goals, infection, bleeding, organ or nerve damage, allergic reactions, paralysis, and death. In addition, the patient was informed of those risks and complications associated to the procedure, such as failure to decrease pain; infection; bleeding; organ or nerve damage with subsequent damage to sensory, motor, and/or autonomic systems, resulting in permanent pain, numbness, and/or weakness of one or several  areas of the body; allergic reactions; (i.e.: anaphylactic reaction); and/or death. Furthermore, the patient was informed of those risks and complications associated with the medications. These include, but are not limited to: allergic reactions (i.e.: anaphylactic or anaphylactoid reaction(s)); adrenal axis suppression; blood sugar elevation that in diabetics may result in ketoacidosis or comma; water retention that in patients with history of congestive heart failure may result in shortness of breath, pulmonary edema, and decompensation with resultant heart failure; weight gain; swelling or edema; medication-induced neural toxicity; particulate matter embolism and blood vessel occlusion with resultant organ, and/or nervous system infarction; and/or aseptic necrosis of one or more joints. Finally, the patient was informed that Medicine is not an exact science; therefore, there is also the possibility of unforeseen or unpredictable risks and/or possible complications that may result in a catastrophic outcome. The patient indicated having understood very clearly. We have given the patient no guarantees and we have made no promises. Enough time was given to the patient to ask questions, all of which were answered to the patient's satisfaction. Ms. Chretien has indicated that she wanted to continue with the procedure. Attestation: I, the ordering provider, attest that I have discussed with the patient the benefits, risks, side-effects, alternatives, likelihood of achieving goals, and potential problems during recovery for the procedure that I have provided informed consent. Date: 07/21/2017  Time: N/A  Pre-Procedure Preparation:  Monitoring: As per clinic protocol. Respiration, ETCO2, SpO2, BP, heart rate and rhythm monitor placed and checked for adequate function Safety Precautions: Patient was assessed for positional comfort and pressure points before starting the  procedure. Time-out: I initiated and conducted  the "Time-out" before starting the procedure, as per protocol. The patient was asked to participate by confirming the accuracy of the "Time Out" information. Verification of the correct person, site, and procedure were performed and confirmed by me, the nursing staff, and the patient. "Time-out" conducted as per Joint Commission's Universal Protocol (UP.01.01.01). "Time-out" Date & Time: 07/21/2017; 0923 hrs.  Description of Procedure Process:   Prepping solution: ChloraPrep (2% chlorhexidine gluconate and 70% isopropyl alcohol) Safety Precautions: Aspiration looking for blood return was conducted prior to all injections. At no point did we inject any substances, as a needle was being advanced. No attempts were made at seeking any paresthesias. Safe injection practices and needle disposal techniques used. Medications properly checked for expiration dates. SDV (single dose vial) medications used. Description of the Procedure: Protocol guidelines were followed. The patient was placed in position over the fluoroscopy table. The target area was identified and the area prepped in the usual manner. Skin desensitized using vapocoolant spray. Skin & deeper tissues infiltrated with local anesthetic. Appropriate amount of time allowed to pass for local anesthetics to take effect. The procedure needles were then advanced to the target area. Proper needle placement secured. Negative aspiration confirmed. Solution injected in intermittent fashion, asking for systemic symptoms every 0.5cc of injectate. The needles were then removed and the area cleansed, making sure to leave some of the prepping solution back to take advantage of its long term bactericidal properties. Vitals:   07/21/17 0836 07/21/17 0930  BP: (!) 132/91 (!) 135/95  Pulse: 79 76  Resp: 16 16  Temp: 98.4 F (36.9 C)   TempSrc: Oral   SpO2: 100% 99%  Weight: 180 lb (81.6 kg)   Height: 5' 6"  (1.676 m)     Start Time: 0923 hrs. End Time: 0927  hrs. Materials:  Needle(s) Type: Epidural needle Gauge: 25G Length: 1.5-in Medication(s): Please see orders for medications and dosing details.  Assessment  Primary Diagnosis & Pertinent Problem List: The primary encounter diagnosis was Chronic low back pain (Primary Source of Pain) (midline). Diagnoses of Lumbar facet syndrome (Bilateral), Chronic hip pain (Secondary source of pain) (Left), Chronic sacroiliac joint pain (Left), Chronic pain syndrome, Trigger point with back pain (Left), and Chronic musculoskeletal pain were also pertinent to this visit.  Status Diagnosis  Controlled Controlled Controlled 1. Chronic low back pain (Primary Source of Pain) (midline)   2. Lumbar facet syndrome (Bilateral)   3. Chronic hip pain (Secondary source of pain) (Left)   4. Chronic sacroiliac joint pain (Left)   5. Chronic pain syndrome   6. Trigger point with back pain (Left)   7. 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 triamcinolone acetonide (KENALOG-40) injection 40 mg  . ropivacaine (PF) 2 mg/mL (0.2%) (NAROPIN) injection 4 mL   Medications administered today: We administered triamcinolone acetonide and ropivacaine (PF) 2 mg/mL (0.2%).   Procedure Orders     TRIGGER POINT INJECTION Lab Orders  No laboratory test(s) ordered today   Imaging Orders  No imaging studies ordered today   Referral Orders  No referral(s) requested today  Interventional management options: Planned, scheduled, and/or pending:   Diagnostic/therapeutic left erector spina muscle and quadratus lumborum muscle trigger point  injection done today.   Considering:   Diagnostic bilateral lumbar facet block(NO STEROIDS)  Possible bilateral lumbar facet RFA(right side done on 06/26/2017; left side on 05/06/2017) Diagnostic left sided caudal epidural steroid injection + diagnostic epidurogram Possible left-sided Racz procedure Diagnostic left L4-5 interlaminar lumbar epidural steroid injection Diagnosticleft L4-5 transforaminal epidural steroid injection Diagnostic left L4 selective nerve root block Possible left L4 nerve root ganglion RFA Possible lumbar spinal cord stimulator trial   Palliative PRN treatment(s):   Diagnostic bilateral lumbar facet block(NO STEROIDS) # 3   Provider-requested follow-up: Return for Med-Mgmt, w/ Dionisio David, NP.  Future Appointments  Date Time Provider [place]  07/21/2017 10:40 AM [place] None  08/14/2017 12:45 PM Vevelyn Francois, NP [name] None  08/25/2017  2:00 PM Lada, Satira Anis, MD [facility]   Primary Care Physician: Arnetha Courser, MD Location: [facility] Note by: Gaspar Cola, MD Date: 07/21/2017; Time: 9:39 AM

## 2017-07-21 NOTE — Progress Notes (Signed)
Safety precautions to be maintained throughout the outpatient stay will include: orient to surroundings, keep bed in low position, maintain call bell within reach at all times, provide assistance with transfer out of bed and ambulation.  

## 2017-07-21 NOTE — Progress Notes (Signed)
Patient ID: [name], female    DOB: 02/08/1971  Age: 47 y.o. MRN: [id number]  Chief Complaint  Patient presents with  . URI  . Sore Throat    Subjective:   Patient has been sick for about a week and a half.  Started with cough and congestion and runny nose.  She started doing a little better then got worse and has persisted.  She has a lot of headache and facial pain, more on the right.  She has a persistent sore throat which is been pretty bad.  She has some cough.  She is a cigarette smoker, says she is addicted.  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  She is undergoing separation right now.  Current allergies, medications, problem list, past/family and social histories reviewed.  Objective:  BP 134/81   Pulse 90   Temp 98.7 F (37.1 C) (Oral)   Resp 17   SpO2 96%   Looks ill.  TMs normal but she feels pressure in the ears.  Her throat is clear except for a little postnasal drainage.  Neck supple without nodes.  Chest clear to auscultation.  Heart regular without murmur.  Tenderness over sinuses.  Strep was negative.   Assessment & Plan:   Assessment: 1. Acute non-recurrent maxillary sinusitis   2. Cough   3. Acute pharyngitis, unspecified etiology       Plan: See instructions  Orders Placed This Encounter  Procedures  . POCT Rapid Strep A-Office (CPT [id number])    Meds ordered this encounter  Medications  . amoxicillin-clavulanate (AUGMENTIN) 875-125 MG tablet    Sig: Take 1 tablet by mouth 2 (two) times daily.    Dispense:  20 tablet    Refill:  0     Strep negative    Patient Instructions  Drink lots of fluids and try to get enough rest  Take Tylenol 500 mg 2 pills 3 times daily and/or ibuprofen 200 mg 3-4 pills 3 times daily as needed for headache and facial pain.  Take Claritin-D or Allegra-D for congestion  Continue using the Flonase (fluticasone) nose spray, twice daily for about 3  days then go back to the regular once daily.  Take the Augmentin 875 mg (amoxicillin/clavulanate) 1 twice daily for 10 days for sinusitis  I urged you to set a target date for when you are going to quit smoking, and taper down to half a dozen cigarettes before your quit date as we discussed.  On that day get rid of them completely and never touched another cigarette.  As a nicotine addict if you smoke a single cigarette you probably will go back to them.  Return as necessary    Return if symptoms worsen or fail to improve.   Jayquon Theiler, MD 07/21/2017

## 2017-07-21 NOTE — Patient Instructions (Signed)
Drink lots of fluids and try to get enough rest  Take Tylenol 500 mg 2 pills 3 times daily and/or ibuprofen 200 mg 3-4 pills 3 times daily as needed for headache and facial pain.  Take Claritin-D or Allegra-D for congestion  Continue using the Flonase (fluticasone) nose spray, twice daily for about 3 days then go back to the regular once daily.  Take the Augmentin 875 mg (amoxicillin/clavulanate) 1 twice daily for 10 days for sinusitis  I urged you to set a target date for when you are going to quit smoking, and taper down to half a dozen cigarettes before your quit date as we discussed.  On that day get rid of them completely and never touched another cigarette.  As a nicotine addict if you smoke a single cigarette you probably will go back to them.  Return as necessary

## 2017-08-02 ENCOUNTER — Telehealth: Payer: Self-pay | Admitting: Family Medicine

## 2017-08-02 NOTE — Telephone Encounter (Signed)
-----   Message from Roetta Sessions sent at 08/01/2017 11:06 AM EST ----- Regarding: question about referral We got a referral for Chipper Oman to our PM&R - she is already receiving PM&R in [place] at the [facility] there - and she lives there.  There is nothing really different we do in [place] .  Dr. Dossie Arbour is already doing injections.  If there were something we could do differently to help we would accept the referral, but not sure why she would want to drive to [place].  We don't accept just Med. Management - and the procedures don't seem to be helping,??

## 2017-08-02 NOTE — Telephone Encounter (Signed)
Please let the patient know the above We'll ask her to contact Dr. Dossie Arbour for further documentation about her current pain problems, limitations, and his opinion on disability

## 2017-08-04 NOTE — Telephone Encounter (Signed)
Called pt, no verbal answer. Call disconnected. Will try again later. CRM created.

## 2017-08-06 NOTE — Telephone Encounter (Signed)
Called pt no answer. LM for pt informing her of the information below per Dr.Lada. Advised pt to call back for questions or concerns.

## 2017-08-11 ENCOUNTER — Ambulatory Visit: Payer: Self-pay

## 2017-08-14 ENCOUNTER — Ambulatory Visit: Payer: Managed Care, Other (non HMO) | Attending: Nurse Practitioner | Admitting: Nurse Practitioner

## 2017-08-14 ENCOUNTER — Telehealth: Payer: Self-pay

## 2017-08-14 ENCOUNTER — Encounter: Payer: Self-pay | Admitting: Nurse Practitioner

## 2017-08-14 VITALS — BP 120/85 | HR 96 | Temp 98.3°F | Resp 16 | Ht 66.0 in | Wt 180.0 lb

## 2017-08-14 DIAGNOSIS — Z9889 Other specified postprocedural states: Secondary | ICD-10-CM | POA: Insufficient documentation

## 2017-08-14 DIAGNOSIS — Z885 Allergy status to narcotic agent status: Secondary | ICD-10-CM | POA: Diagnosis not present

## 2017-08-14 DIAGNOSIS — G894 Chronic pain syndrome: Secondary | ICD-10-CM

## 2017-08-14 DIAGNOSIS — F4321 Adjustment disorder with depressed mood: Secondary | ICD-10-CM | POA: Diagnosis not present

## 2017-08-14 DIAGNOSIS — Z833 Family history of diabetes mellitus: Secondary | ICD-10-CM | POA: Diagnosis not present

## 2017-08-14 DIAGNOSIS — Z8249 Family history of ischemic heart disease and other diseases of the circulatory system: Secondary | ICD-10-CM | POA: Diagnosis not present

## 2017-08-14 DIAGNOSIS — M47817 Spondylosis without myelopathy or radiculopathy, lumbosacral region: Secondary | ICD-10-CM | POA: Insufficient documentation

## 2017-08-14 DIAGNOSIS — M48061 Spinal stenosis, lumbar region without neurogenic claudication: Secondary | ICD-10-CM | POA: Diagnosis not present

## 2017-08-14 DIAGNOSIS — G8918 Other acute postprocedural pain: Secondary | ICD-10-CM | POA: Diagnosis not present

## 2017-08-14 DIAGNOSIS — E559 Vitamin D deficiency, unspecified: Secondary | ICD-10-CM | POA: Diagnosis not present

## 2017-08-14 DIAGNOSIS — M47816 Spondylosis without myelopathy or radiculopathy, lumbar region: Secondary | ICD-10-CM

## 2017-08-14 DIAGNOSIS — M549 Dorsalgia, unspecified: Secondary | ICD-10-CM | POA: Insufficient documentation

## 2017-08-14 DIAGNOSIS — Z8261 Family history of arthritis: Secondary | ICD-10-CM | POA: Insufficient documentation

## 2017-08-14 DIAGNOSIS — Z79899 Other long term (current) drug therapy: Secondary | ICD-10-CM | POA: Diagnosis not present

## 2017-08-14 DIAGNOSIS — M533 Sacrococcygeal disorders, not elsewhere classified: Secondary | ICD-10-CM | POA: Insufficient documentation

## 2017-08-14 DIAGNOSIS — M7918 Myalgia, other site: Secondary | ICD-10-CM | POA: Diagnosis not present

## 2017-08-14 DIAGNOSIS — M5442 Lumbago with sciatica, left side: Secondary | ICD-10-CM

## 2017-08-14 DIAGNOSIS — F17210 Nicotine dependence, cigarettes, uncomplicated: Secondary | ICD-10-CM | POA: Diagnosis not present

## 2017-08-14 DIAGNOSIS — Z806 Family history of leukemia: Secondary | ICD-10-CM | POA: Diagnosis not present

## 2017-08-14 DIAGNOSIS — Z7689 Persons encountering health services in other specified circumstances: Principal | ICD-10-CM

## 2017-08-14 DIAGNOSIS — Z0189 Encounter for other specified special examinations: Secondary | ICD-10-CM

## 2017-08-14 DIAGNOSIS — Z79891 Long term (current) use of opiate analgesic: Secondary | ICD-10-CM

## 2017-08-14 DIAGNOSIS — G8929 Other chronic pain: Secondary | ICD-10-CM

## 2017-08-14 MED ORDER — DULOXETINE HCL 30 MG PO CPEP: 30.0000 mg | ORAL_CAPSULE | Freq: Two times a day (BID) | ORAL | 0 refills | Status: DC

## 2017-08-14 MED ORDER — OXYCODONE-ACETAMINOPHEN 10-325 MG PO TABS: 1.0000 | ORAL_TABLET | Freq: Four times a day (QID) | ORAL | 0 refills | Status: DC | PRN

## 2017-08-14 MED ORDER — CYCLOBENZAPRINE HCL 10 MG PO TABS: 10.0000 mg | ORAL_TABLET | Freq: Every day | ORAL | 0 refills | Status: DC

## 2017-08-14 MED ORDER — MELOXICAM 15 MG PO TABS
15.0000 mg | ORAL_TABLET | Freq: Every day | ORAL | 0 refills | Status: DC
Start: 2017-08-22 — End: 2017-09-08

## 2017-08-14 NOTE — Progress Notes (Signed)
Nursing Pain Medication Assessment:  Safety precautions to be maintained throughout the outpatient stay will include: orient to surroundings, keep bed in low position, maintain call bell within reach at all times, provide assistance with transfer out of bed and ambulation.  Medication Inspection Compliance: Pill count conducted under aseptic conditions, in front of the patient. Neither the pills nor the bottle was removed from the patient's sight at any time. Once count was completed pills were immediately returned to the patient in their original bottle.  Medication: Hydrocodone/APAP Pill/Patch Count: 9.5 of 120 pills remain Pill/Patch Appearance: Markings consistent with prescribed medication Bottle Appearance: Standard pharmacy container. Clearly labeled. Filled Date: 02 / 13 / 2019 Last Medication intake:  Today

## 2017-08-14 NOTE — Progress Notes (Signed)
Patient's Name: [name]  MRN: [id number]  Referring Provider: Arnetha Courser, MD  DOB: 1971/02/27  PCP: Arnetha Courser, MD  DOS: 08/14/2017  Note by: Vevelyn Francois NP  Service setting: Ambulatory outpatient  Specialty: Interventional Pain Management  Location: [name] (AMB) Pain Management Facility    Patient type: Established    Primary Reason(s) for Visit: Encounter for prescription drug management. (Level of risk: moderate)  CC: Back Pain (left lower )  HPI  [name] is a 47 y.o. year old, female patient, who comes today for a medication management evaluation. She has Adjustment disorder with depressed mood; Stressful life events affecting family and household; Preventative health care; Vitamin D deficiency; Lumbosacral spondylosis without myelopathy; Lumbar central spinal stenosis (L4-5); Breast cancer screening; Bladder irritation; Chronic low back pain (Primary Source of Pain) (midline); Arthralgia of multiple joints; Chronic, continuous use of opioids; Elevated rheumatoid factor; Chronic pain syndrome; Long term (current) use of opiate analgesic; Long term prescription opiate use; Opiate use (85.5 MME/Day); Chronic hip pain (Secondary source of pain) (Left); Failed back surgical syndrome; Discogenic low back pain; Lumbar discogenic pain syndrome; Chronic sacroiliac joint pain (Left); Chronic musculoskeletal pain; Lumbar facet syndrome (Bilateral); Lumbar spondylosis; Neurogenic pain; Depression; Acute postoperative pain; and Trigger point with back pain (Left) on their problem list. Her primarily concern today is the Back Pain (left lower )  Pain Assessment: Location: Lower, Left Back Radiating: na Onset: More than a month ago Duration: Chronic pain Quality: Discomfort, Dull, Stabbing, Constant Severity: 2 /10 (self-reported pain score)  Note: Reported level is compatible with observation.                          Effect on ADL: unable to sit, stand or for very long periods of time, 15  minutes.  sleep pattern is disrupted.  Timing: Constant Modifying factors: procedure helped right side.  medicine  [name] was last scheduled for an appointment on 06/12/2017 for medication management. During today's appointment we reviewed [name]'s chronic pain status, as well as her outpatient medication regimen. She states that the trigger point was not effective. She admits that she has failed many therapies in the past at other offices. She does feel like this has been the best pain management  that she has received. She will like to have her left-sided lumbar facet RFA however it has only been 3 months. She admits that she has not done any recent physical therapy. She admits that she is not able to tolerate the gabapentin secondary to nausea. She admits that she does not use the meloxicam and Flexeril as directed..   The patient  reports that she does not use drugs. Her body mass index is 29.05 kg/m.  Further details on both, my assessment(s), as well as the proposed treatment plan, please see below.  Controlled Substance Pharmacotherapy Assessment REMS (Risk Evaluation and Mitigation Strategy)  Analgesic:Oxycodone/acetaminophenIR 10/'325mg'$  every 6 hours ('40mg'$ /dayof oxycodone) (60MME/day) MME/day:'60mg'$ /day.     [name], RN  08/14/2017 12:44 PM  Sign at close encounter Nursing Pain Medication Assessment:  Safety precautions to be maintained throughout the outpatient stay will include: orient to surroundings, keep bed in low position, maintain call bell within reach at all times, provide assistance with transfer out of bed and ambulation.  Medication Inspection Compliance: Pill count conducted under aseptic conditions, in front of the patient. Neither the pills nor the bottle was removed from the patient's sight at  any time. Once count was completed pills were immediately returned to the patient in their original bottle.  Medication: Hydrocodone/APAP Pill/Patch Count: 9.5  of 120 pills remain Pill/Patch Appearance: Markings consistent with prescribed medication Bottle Appearance: Standard pharmacy container. Clearly labeled. Filled Date: 02 / 13 / 2019 Last Medication intake:  Today   Pharmacokinetics: Liberation and absorption (onset of action): WNL Distribution (time to peak effect): WNL Metabolism and excretion (duration of action): WNL         Pharmacodynamics: Desired effects: Analgesia: [name] reports >50% benefit. Functional ability: Patient reports that medication allows her to accomplish basic ADLs Clinically meaningful improvement in function (CMIF): Sustained CMIF goals met Perceived effectiveness: Described as relatively effective, allowing for increase in activities of daily living (ADL) Undesirable effects: Side-effects or Adverse reactions: None reported Monitoring: [place] PMP: Online review of the past 24-monthperiod conducted. Compliant with practice rules and regulations Last UDS on record: Summary  Date Value Ref Range Status  04/14/2017 FINAL  Final    Comment:    ==================================================================== TOXASSURE SELECT 13 (MW) ==================================================================== Test                             Result       Flag       Units Drug Present and Declared for Prescription Verification   Oxycodone                      645          EXPECTED   ng/mg creat   Oxymorphone                    1345         EXPECTED   ng/mg creat   Noroxycodone                   3037         EXPECTED   ng/mg creat   Noroxymorphone                 425          EXPECTED   ng/mg creat    Sources of oxycodone are scheduled prescription medications.    Oxymorphone, noroxycodone, and noroxymorphone are expected    metabolites of oxycodone. Oxymorphone is also available as a    scheduled prescription medication. ==================================================================== Test                       Result    Flag   Units      Ref Range   Creatinine              137              mg/dL      >=20 ==================================================================== Declared Medications:  The flagging and interpretation on this report are based on the  following declared medications.  Unexpected results may arise from  inaccuracies in the declared medications.  **Note: The testing scope of this panel includes these medications:  Oxycodone  **Note: The testing scope of this panel does not include following  reported medications:  Cyclobenzaprine (Flexeril)  Duloxetine (Cymbalta)  Gabapentin  Meloxicam (Mobic) ==================================================================== For clinical consultation, please call [phone] ====================================================================    UDS interpretation: Compliant          Medication Assessment Form: Reviewed. Patient indicates being compliant with therapy Treatment compliance: Non-compliant.  Steps taken to remind the patient of the seriousness of adequate therapy compliance She thought that she was doing better. She feels like that she may not be using the Flexeril and Meloxicam . She admits that she can grab the Oxycodone and it knocks the pain out just like that.   Risk Assessment Profile: Aberrant behavior: See prior evaluations. None observed or detected today Comorbid factors increasing risk of overdose: See prior notes. No additional risks detected today Risk of substance use disorder (SUD): Low Opioid Risk Tool - 07/21/17 0839      Family History of Substance Abuse   Alcohol  Negative    Illegal Drugs  Negative    Rx Drugs  Negative      Personal History of Substance Abuse   Alcohol  Negative    Illegal Drugs  Negative    Rx Drugs  Negative      Psychological Disease   Psychological Disease  Negative    Depression  Negative      Total Score   Opioid Risk Tool Scoring  0    Opioid Risk  Interpretation  Low Risk      ORT Scoring interpretation table:  Score <3 = Low Risk for SUD  Score between 4-7 = Moderate Risk for SUD  Score >8 = High Risk for Opioid Abuse   Risk Mitigation Strategies:  Patient Counseling: Covered Patient-Prescriber Agreement (PPA): Present and active  Notification to other healthcare providers: Done  Pharmacologic Plan: No change in therapy, at this time.             Laboratory Chemistry  Inflammation Markers (CRP: Acute Phase) (ESR: Chronic Phase) Lab Results  Component Value Date   CRP 5.3 (H) 12/05/2016   ESRSEDRATE 9 12/05/2016                         Rheumatology Markers Lab Results  Component Value Date   RF 17.9 (H) 12/05/2016   ANA NEG 10/02/2016   LABURIC 2.5 09/18/2016                Renal Function Markers Lab Results  Component Value Date   BUN 17 12/05/2016   CREATININE 0.84 12/05/2016   GFRAA 96 12/05/2016   GFRNONAA 83 12/05/2016                 Hepatic Function Markers Lab Results  Component Value Date   AST 21 12/05/2016   ALT 20 12/05/2016   ALBUMIN 4.4 12/05/2016   ALKPHOS 59 12/05/2016   LIPASE 107 12/26/2011                 Electrolytes Lab Results  Component Value Date   NA 139 12/05/2016   K 4.6 12/05/2016   CL 103 12/05/2016   CALCIUM 9.5 12/05/2016   MG 2.1 12/05/2016   PHOS 3.0 09/18/2016                        Neuropathy Markers Lab Results  Component Value Date   VITAMINB12 495 12/05/2016                 Bone Pathology Markers Lab Results  Component Value Date   VD25OH 27.1 (L) 09/18/2016   25OHVITD1 49 12/05/2016   25OHVITD2 <1.0 12/05/2016   25OHVITD3 49 12/05/2016  Coagulation Parameters Lab Results  Component Value Date   INR 0.9 04/16/2013   LABPROT 12.4 04/16/2013   PLT 287 10/02/2016                 Cardiovascular Markers Lab Results  Component Value Date   CKTOTAL 75 04/16/2013   CKMB 0.6 04/16/2013   TROPONINI < 0.02 04/16/2013    HGB 13.3 10/02/2016   HCT 40.8 10/02/2016                 CA Markers No results found for: CEA, CA125, LABCA2               Note: Lab results reviewed.  Recent Diagnostic Imaging Results  DG C-Arm 1-60 Min-No Report Fluoroscopy was utilized by the requesting physician.  No radiographic  interpretation.   Complexity Note: Imaging results reviewed. Results shared with Ms. Grieb, using Layman's terms.                         Meds   Current Outpatient Medications:  .  [START ON 08/22/2017] cyclobenzaprine (FLEXERIL) 10 MG tablet, Take 1 tablet (10 mg total) by mouth at bedtime., Disp: 30 tablet, Rfl: 0 .  [START ON 08/22/2017] DULoxetine (CYMBALTA) 30 MG capsule, Take 1 capsule (30 mg total) by mouth 2 (two) times daily., Disp: 60 capsule, Rfl: 0 .  [START ON 08/22/2017] meloxicam (MOBIC) 15 MG tablet, Take 1 tablet (15 mg total) by mouth daily., Disp: 30 tablet, Rfl: 0 .  [START ON 08/22/2017] oxyCODONE-acetaminophen (PERCOCET) 10-325 MG tablet, Take 1 tablet by mouth every 6 (six) hours as needed for pain., Disp: 120 tablet, Rfl: 0  ROS  Constitutional: Denies any fever or chills Gastrointestinal: No reported hemesis, hematochezia, vomiting, or acute GI distress Musculoskeletal: Denies any acute onset joint swelling, redness, loss of ROM, or weakness Neurological: No reported episodes of acute onset apraxia, aphasia, dysarthria, agnosia, amnesia, paralysis, loss of coordination, or loss of consciousness  Allergies  Ms. Chopra is allergic to codeine.  PFSH  Drug: Ms. Fleeman  reports that she does not use drugs. Alcohol:  reports that she does not drink alcohol. Tobacco:  reports that she has been smoking cigarettes.  She has a 20.00 pack-year smoking history. she has never used smokeless tobacco. Medical:  has a past medical history of Allergy, Anxiety, Chronic bilateral low back pain (07/22/2016), and Depression. Surgical: Ms. Laursen  has a past surgical history that includes Abdominal  hysterectomy; Tonsillectomy; Cesarean section; Spine surgery; Spine surgery (09/09/2015); and Back surgery. Family: family history includes Arthritis in her father; Cancer in her maternal aunt; Depression in her sister and sister; Diabetes in her mother; Heart disease in her father and maternal grandfather; Hyperlipidemia in her father; Hypertension in her father and mother; Lung disease in her father; Lymphoma in her father.  Constitutional Exam  General appearance: Well nourished, well developed, and well hydrated. In no apparent acute distress Vitals:   08/14/17 1243  BP: 120/85  Pulse: 96  Resp: 16  Temp: 98.3 F (36.8 C)  TempSrc: Oral  SpO2: 98%  Weight: 180 lb (81.6 kg)  Height: _0  (1.676 m)  Psych/Mental status: Alert, oriented x 3 (person, place, & time)       Eyes: PERLA Respiratory: No evidence of acute respiratory distress  Cervical Spine Area Exam  Skin & Axial Inspection: No masses, redness, edema, swelling, or associated skin lesions Alignment: Symmetrical Functional ROM: Unrestricted ROM  Stability: No instability detected Muscle Tone/Strength: Functionally intact. No obvious neuro-muscular anomalies detected. Sensory (Neurological): Unimpaired Palpation: No palpable anomalies              Upper Extremity (UE) Exam    Side: Right upper extremity  Side: Left upper extremity  Skin & Extremity Inspection: Skin color, temperature, and hair growth are WNL. No peripheral edema or cyanosis. No masses, redness, swelling, asymmetry, or associated skin lesions. No contractures.  Skin & Extremity Inspection: Skin color, temperature, and hair growth are WNL. No peripheral edema or cyanosis. No masses, redness, swelling, asymmetry, or associated skin lesions. No contractures.  Functional ROM: Unrestricted ROM          Functional ROM: Unrestricted ROM          Muscle Tone/Strength: Functionally intact. No obvious neuro-muscular anomalies detected.  Muscle Tone/Strength:  Functionally intact. No obvious neuro-muscular anomalies detected.  Sensory (Neurological): Unimpaired          Sensory (Neurological): Unimpaired          Palpation: No palpable anomalies              Palpation: No palpable anomalies              Specialized Test(s): Deferred         Specialized Test(s): Deferred          Thoracic Spine Area Exam  Skin & Axial Inspection: No masses, redness, or swelling Alignment: Symmetrical Functional ROM: Unrestricted ROM Stability: No instability detected Muscle Tone/Strength: Functionally intact. No obvious neuro-muscular anomalies detected. Sensory (Neurological): Unimpaired Muscle strength & Tone: No palpable anomalies  Lumbar Spine Area Exam  Skin & Axial Inspection: No masses, redness, or swelling Alignment: Symmetrical Functional ROM: Unrestricted ROM      Stability: No instability detected Muscle Tone/Strength: Functionally intact. No obvious neuro-muscular anomalies detected. Sensory (Neurological): Unimpaired Palpation: Complains of area being tender to palpation       Provocative Tests: Lumbar Hyperextension and rotation test: evaluation deferred today       Lumbar Lateral bending test: evaluation deferred today       Patrick's Maneuver: evaluation deferred today                    Gait & Posture Assessment  Ambulation: Unassisted Gait: Relatively normal for age and body habitus Posture: WNL   Lower Extremity Exam    Side: Right lower extremity  Side: Left lower extremity  Skin & Extremity Inspection: Skin color, temperature, and hair growth are WNL. No peripheral edema or cyanosis. No masses, redness, swelling, asymmetry, or associated skin lesions. No contractures.  Skin & Extremity Inspection: Skin color, temperature, and hair growth are WNL. No peripheral edema or cyanosis. No masses, redness, swelling, asymmetry, or associated skin lesions. No contractures.  Functional ROM: Unrestricted ROM          Functional ROM: Unrestricted  ROM          Muscle Tone/Strength: Functionally intact. No obvious neuro-muscular anomalies detected.  Muscle Tone/Strength: Functionally intact. No obvious neuro-muscular anomalies detected.  Sensory (Neurological): Unimpaired  Sensory (Neurological): Unimpaired  Palpation: No palpable anomalies  Palpation: No palpable anomalies   Assessment  Primary Diagnosis & Pertinent Problem List: The primary encounter diagnosis was Lumbar spondylosis. Diagnoses of Lumbar facet syndrome (Bilateral), Chronic low back pain (Primary Source of Pain) (midline), Chronic low back pain (Location of Primary Source of Pain) (midline), Musculoskeletal pain, Chronic pain syndrome, Chronic sacroiliac joint pain (Left), and Long  term (current) use of opiate analgesic were also pertinent to this visit.  Status Diagnosis  Controlled Controlled Controlled 1. Lumbar spondylosis   2. Lumbar facet syndrome (Bilateral)   3. Chronic low back pain (Primary Source of Pain) (midline)   4. Chronic low back pain (Location of Primary Source of Pain) (midline)   5. Musculoskeletal pain   6. Chronic pain syndrome   7. Chronic sacroiliac joint pain (Left)   8. Long term (current) use of opiate analgesic     Problems updated and reviewed during this visit: No problems updated. Plan of Care  Pharmacotherapy (Medications Ordered): Meds ordered this encounter  Medications  . DULoxetine (CYMBALTA) 30 MG capsule    Sig: Take 1 capsule (30 mg total) by mouth 2 (two) times daily.    Dispense:  60 capsule    Refill:  0    Do not place medication on "Automatic Refill". Fill one day early if pharmacy is closed on scheduled refill date.    Order Specific Question:   Supervising Provider    Answer:   Milinda Pointer [phone]  . oxyCODONE-acetaminophen (PERCOCET) 10-325 MG tablet    Sig: Take 1 tablet by mouth every 6 (six) hours as needed for pain.    Dispense:  120 tablet    Refill:  0    Do not place this medication, or any  other prescription from our practice, on "Automatic Refill". Patient may have prescription filled one day early if pharmacy is closed on scheduled refill date. Do not fill until: 08/22/2017 To last until: 09/21/2017    Order Specific Question:   Supervising Provider    Answer:   Milinda Pointer [phone]  . cyclobenzaprine (FLEXERIL) 10 MG tablet    Sig: Take 1 tablet (10 mg total) by mouth at bedtime.    Dispense:  30 tablet    Refill:  0    Do not place this medication, or any other prescription from our practice, on "Automatic Refill". Patient may have prescription filled one day early if pharmacy is closed on scheduled refill date.    Order Specific Question:   Supervising Provider    Answer:   Milinda Pointer [phone]  . meloxicam (MOBIC) 15 MG tablet    Sig: Take 1 tablet (15 mg total) by mouth daily.    Dispense:  30 tablet    Refill:  0    Do not place medication on "Automatic Refill". Fill one day early if pharmacy is closed on scheduled refill date.    Order Specific Question:   Supervising Provider    Answer:   Milinda Pointer [[id number]]   New Prescriptions   No medications on file   Medications administered today: Adrain Butrick. Hobbins had no medications administered during this visit. Lab-work, procedure(s), and/or referral(s): Orders Placed This Encounter  Procedures  . ToxASSURE Select 13 (MW), Urine   Imaging and/or referral(s): None  Interventional management options: Planned, scheduled, and/or pending:  Not at this time    Considering:  Diagnostic bilateral lumbar facet block Possible bilateral lumbar facet RFA Diagnostic left sided caudal epidural steroid injection + diagnostic epidurogram Possible left-sided Racz procedure Diagnostic left L4-5 interlaminar lumbar epidural steroid injection Diagnosticleft L4-5 transforaminal epidural steroid injection Diagnostic left L4 selective nerve root block Possible left L4 nerve root ganglion RFA Possible  lumbar spinal cord stimulator trial   Palliative PRN treatment(s):  Diagnostic bilateral lumbar facet block(NO STEROIDS)   Provider-requested follow-up: Return in about 4 weeks (around 09/11/2017) for MedMgmt with  Me (Ercil Cassis [place]), in addition.  Future Appointments  Date Time Provider [place]  08/25/2017  2:00 PM Arnetha Courser, MD [facility]  09/08/2017 12:45 PM Vevelyn Francois, NP [facility] None   Primary Care Physician: Arnetha Courser, MD Location: [facility] Outpatient Pain Management Facility Note by: Vevelyn Francois NP Date: 08/14/2017; Time: 3:47 PM  Pain Score Disclaimer: We use the NRS-11 scale. This is a self-reported, subjective measurement of pain severity with only modest accuracy. It is used primarily to identify changes within a particular patient. It must be understood that outpatient pain scales are significantly less accurate that those used for research, where they can be applied under ideal controlled circumstances with minimal exposure to variables. In reality, the score is likely to be a combination of pain intensity and pain affect, where pain affect describes the degree of emotional arousal or changes in action readiness caused by the sensory experience of pain. Factors such as social and work situation, setting, emotional state, anxiety levels, expectation, and prior pain experience may influence pain perception and show large inter-individual differences that may also be affected by time variables.  Patient instructions provided during this appointment: Patient Instructions   ____________You have been given a s ript for oxycodone x 1 today.  You have been instructed to get a UDS today.  You have scripts that have been sent to the pharmacy for pick up. ________________________________________________________________________________  Medication Rules  Applies to: All patients receiving prescriptions (written or electronic).  Pharmacy of record: Pharmacy  where electronic prescriptions will be sent. If written prescriptions are taken to a different pharmacy, please inform the nursing staff. The pharmacy listed in the electronic medical record should be the one where you would like electronic prescriptions to be sent.  Prescription refills: Only during scheduled appointments. Applies to both, written and electronic prescriptions.  NOTE: The following applies primarily to controlled substances (Opioid* Pain Medications).   Patient's responsibilities: 1. Pain Pills: Bring all pain pills to every appointment (except for procedure appointments). 2. Pill Bottles: Bring pills in original pharmacy bottle. Always bring newest bottle. Bring bottle, even if empty. 3. Medication refills: You are responsible for knowing and keeping track of what medications you need refilled. The day before your appointment, write a list of all prescriptions that need to be refilled. Bring that list to your appointment and give it to the admitting nurse. Prescriptions will be written only during appointments. If you forget a medication, it will not be "Called in", "Faxed", or "electronically sent". You will need to get another appointment to get these prescribed. 4. Prescription Accuracy: You are responsible for carefully inspecting your prescriptions before leaving our office. Have the discharge nurse carefully go over each prescription with you, before taking them home. Make sure that your name is accurately spelled, that your address is correct. Check the name and dose of your medication to make sure it is accurate. Check the number of pills, and the written instructions to make sure they are clear and accurate. Make sure that you are given enough medication to last until your next medication refill appointment. 5. Taking Medication: Take medication as prescribed. Never take more pills than instructed. Never take medication more frequently than prescribed. Taking less pills or less  frequently is permitted and encouraged, when it comes to controlled substances (written prescriptions).  6. Inform other Doctors: Always inform, all of your healthcare providers, of all the medications you take. 7. Pain Medication from other Providers: You are  not allowed to accept any additional pain medication from any other Doctor or Healthcare provider. There are two exceptions to this rule. (see below) In the event that you require additional pain medication, you are responsible for notifying us, as stated below. 8. Medication Agreement: You are responsible for carefully reading and following our Medication Agreement. This must be signed before receiving any prescriptions from our practice. Safely store a copy of your signed Agreement. Violations to the Agreement will result in no further prescriptions. (Additional copies of our Medication Agreement are available upon request.) 9. Laws, Rules, & Regulations: All patients are expected to follow all Federal and [employer], [employer], Rules, [employer]. Ignorance of the Laws does not constitute a valid excuse. The use of any illegal substances is prohibited. 10. Adopted CDC guidelines & recommendations: Target dosing levels will be at or below 60 MME/day. Use of benzodiazepines** is not recommended.  Exceptions: There are only two exceptions to the rule of not receiving pain medications from other Healthcare Providers. 1. Exception #1 (Emergencies): In the event of an emergency (i.e.: accident requiring emergency care), you are allowed to receive additional pain medication. However, you are responsible for: As soon as you are able, call our office [phone], at any time of the day or night, and leave a message stating your name, the date and nature of the emergency, and the name and dose of the medication prescribed. In the event that your call is answered by a member of our staff, make sure to document and save the date, time, and the name of the  person that took your information.  2. Exception #2 (Planned Surgery): In the event that you are scheduled by another doctor or dentist to have any type of surgery or procedure, you are allowed (for a period no longer than 30 days), to receive additional pain medication, for the acute post-op pain. However, in this case, you are responsible for picking up a copy of our "Post-op Pain Management for Surgeons" handout, and giving it to your surgeon or dentist. This document is available at our office, and does not require an appointment to obtain it. Simply go to our office during business hours (Monday-Thursday from 8:00 AM to 4:00 PM) (Friday 8:00 AM to 12:00 Noon) or if you have a scheduled appointment with [place], prior to your surgery, and ask for it by name. In addition, you will need to provide [place] with your name, name of your surgeon, type of surgery, and date of procedure or surgery.  *Opioid medications include: morphine, codeine, oxycodone, oxymorphone, hydrocodone, hydromorphone, meperidine, tramadol, tapentadol, buprenorphine, fentanyl, methadone. **Benzodiazepine medications include: diazepam (Valium), alprazolam (Xanax), clonazepam (Klonopine), lorazepam (Ativan), clorazepate (Tranxene), chlordiazepoxide (Librium), estazolam (Prosom), oxazepam (Serax), temazepam (Restoril), triazolam (Halcion) (Last updated: 08/07/2017) ____________________________________________________________________________________________    BMI Assessment: Estimated body mass index is 29.05 kg/m as calculated from the following:   Height as of this encounter: _0  (1.676 m).   Weight as of this encounter: 180 lb (81.6 kg).  BMI interpretation table: BMI level Category Range association with higher incidence of chronic pain  <18 kg/m2 Underweight   18.5-24.9 kg/m2 Ideal body weight   25-29.9 kg/m2 Overweight Increased incidence by 20%  30-34.9 kg/m2 Obese (Class I) Increased incidence by 68%  35-39.9 kg/m2 Severe  obesity (Class II) Increased incidence by 136%  >40 kg/m2 Extreme obesity (Class III) Increased incidence by 254%   BMI Readings from Last 4 Encounters:  08/14/17 29.05 kg/m  07/21/17 29.05 kg/m  06/30/17 29.67 kg/m  06/26/17 29.05 kg/m   Wt Readings from Last 4 Encounters:  08/14/17 180 lb (81.6 kg)  07/21/17 180 lb (81.6 kg)  06/30/17 183 lb 12.8 oz (83.4 kg)  06/26/17 180 lb (81.6 kg)

## 2017-08-14 NOTE — Patient Instructions (Addendum)
____________You have been given a s ript for oxycodone x 1 today.  You have been instructed to get a UDS today.  You have scripts that have been sent to the pharmacy for pick up. ________________________________________________________________________________  Medication Rules  Applies to: All patients receiving prescriptions (written or electronic).  Pharmacy of record: Pharmacy where electronic prescriptions will be sent. If written prescriptions are taken to a different pharmacy, please inform the nursing staff. The pharmacy listed in the electronic medical record should be the one where you would like electronic prescriptions to be sent.  Prescription refills: Only during scheduled appointments. Applies to both, written and electronic prescriptions.  NOTE: The following applies primarily to controlled substances (Opioid* Pain Medications).   Patient's responsibilities: 1. Pain Pills: Bring all pain pills to every appointment (except for procedure appointments). 2. Pill Bottles: Bring pills in original pharmacy bottle. Always bring newest bottle. Bring bottle, even if empty. 3. Medication refills: You are responsible for knowing and keeping track of what medications you need refilled. The day before your appointment, write a list of all prescriptions that need to be refilled. Bring that list to your appointment and give it to the admitting nurse. Prescriptions will be written only during appointments. If you forget a medication, it will not be "Called in", "Faxed", or "electronically sent". You will need to get another appointment to get these prescribed. 4. Prescription Accuracy: You are responsible for carefully inspecting your prescriptions before leaving our office. Have the discharge nurse carefully go over each prescription with you, before taking them home. Make sure that your name is accurately spelled, that your address is correct. Check the name and dose of your medication to make sure  it is accurate. Check the number of pills, and the written instructions to make sure they are clear and accurate. Make sure that you are given enough medication to last until your next medication refill appointment. 5. Taking Medication: Take medication as prescribed. Never take more pills than instructed. Never take medication more frequently than prescribed. Taking less pills or less frequently is permitted and encouraged, when it comes to controlled substances (written prescriptions).  6. Inform other Doctors: Always inform, all of your healthcare providers, of all the medications you take. 7. Pain Medication from other Providers: You are not allowed to accept any additional pain medication from any other Doctor or Healthcare provider. There are two exceptions to this rule. (see below) In the event that you require additional pain medication, you are responsible for notifying us, as stated below. 8. Medication Agreement: You are responsible for carefully reading and following our Medication Agreement. This must be signed before receiving any prescriptions from our practice. Safely store a copy of your signed Agreement. Violations to the Agreement will result in no further prescriptions. (Additional copies of our Medication Agreement are available upon request.) 9. Laws, Rules, & Regulations: All patients are expected to follow all Federal and [employer], [employer], Rules, [employer]. Ignorance of the Laws does not constitute a valid excuse. The use of any illegal substances is prohibited. 10. Adopted CDC guidelines & recommendations: Target dosing levels will be at or below 60 MME/day. Use of benzodiazepines** is not recommended.  Exceptions: There are only two exceptions to the rule of not receiving pain medications from other Healthcare Providers. 1. Exception #1 (Emergencies): In the event of an emergency (i.e.: accident requiring emergency care), you are allowed to receive additional pain  medication. However, you are responsible for: As soon as you are  able, call our office [phone], at any time of the day or night, and leave a message stating your name, the date and nature of the emergency, and the name and dose of the medication prescribed. In the event that your call is answered by a member of our staff, make sure to document and save the date, time, and the name of the person that took your information.  2. Exception #2 (Planned Surgery): In the event that you are scheduled by another doctor or dentist to have any type of surgery or procedure, you are allowed (for a period no longer than 30 days), to receive additional pain medication, for the acute post-op pain. However, in this case, you are responsible for picking up a copy of our "Post-op Pain Management for Surgeons" handout, and giving it to your surgeon or dentist. This document is available at our office, and does not require an appointment to obtain it. Simply go to our office during business hours (Monday-Thursday from 8:00 AM to 4:00 PM) (Friday 8:00 AM to 12:00 Noon) or if you have a scheduled appointment with [place], prior to your surgery, and ask for it by name. In addition, you will need to provide [place] with your name, name of your surgeon, type of surgery, and date of procedure or surgery.  *Opioid medications include: morphine, codeine, oxycodone, oxymorphone, hydrocodone, hydromorphone, meperidine, tramadol, tapentadol, buprenorphine, fentanyl, methadone. **Benzodiazepine medications include: diazepam (Valium), alprazolam (Xanax), clonazepam (Klonopine), lorazepam (Ativan), clorazepate (Tranxene), chlordiazepoxide (Librium), estazolam (Prosom), oxazepam (Serax), temazepam (Restoril), triazolam (Halcion) (Last updated: 08/07/2017) ____________________________________________________________________________________________    BMI Assessment: Estimated body mass index is 29.05 kg/m as calculated from the following:    Height as of this encounter: 5\' 6"  (1.676 m).   Weight as of this encounter: 180 lb (81.6 kg).  BMI interpretation table: BMI level Category Range association with higher incidence of chronic pain  <18 kg/m2 Underweight   18.5-24.9 kg/m2 Ideal body weight   25-29.9 kg/m2 Overweight Increased incidence by 20%  30-34.9 kg/m2 Obese (Class I) Increased incidence by 68%  35-39.9 kg/m2 Severe obesity (Class II) Increased incidence by 136%  >40 kg/m2 Extreme obesity (Class III) Increased incidence by 254%   BMI Readings from Last 4 Encounters:  08/14/17 29.05 kg/m  07/21/17 29.05 kg/m  06/30/17 29.67 kg/m  06/26/17 29.05 kg/m   Wt Readings from Last 4 Encounters:  08/14/17 180 lb (81.6 kg)  07/21/17 180 lb (81.6 kg)  06/30/17 183 lb 12.8 oz (83.4 kg)  06/26/17 180 lb (81.6 kg)

## 2017-08-14 NOTE — Telephone Encounter (Signed)
Incoming call from pt stating that she knows Dr.Lada does not do evaluation for functional capacity but is requesting a referral for someone who can help her with this, after speaking with Dr.Sowles (Lada in a room) she states that pt should be seen and evaluated by Physical Therapy. Referral placed, pt voiced understanding.

## 2017-08-20 LAB — TOXASSURE SELECT 13 (MW), URINE

## 2017-08-25 ENCOUNTER — Encounter: Payer: Self-pay | Admitting: Family Medicine

## 2017-08-25 ENCOUNTER — Ambulatory Visit (INDEPENDENT_AMBULATORY_CARE_PROVIDER_SITE_OTHER): Payer: Managed Care, Other (non HMO) | Admitting: Family Medicine

## 2017-08-25 VITALS — BP 112/70 | HR 99 | Temp 98.1°F | Resp 14 | Ht 66.38 in | Wt 182.9 lb

## 2017-08-25 DIAGNOSIS — N898 Other specified noninflammatory disorders of vagina: Secondary | ICD-10-CM

## 2017-08-25 DIAGNOSIS — Z0000 Encounter for general adult medical examination without abnormal findings: Secondary | ICD-10-CM | POA: Diagnosis not present

## 2017-08-25 DIAGNOSIS — Z1231 Encounter for screening mammogram for malignant neoplasm of breast: Secondary | ICD-10-CM

## 2017-08-25 DIAGNOSIS — Z720 Tobacco use: Secondary | ICD-10-CM

## 2017-08-25 DIAGNOSIS — Z124 Encounter for screening for malignant neoplasm of cervix: Secondary | ICD-10-CM | POA: Diagnosis not present

## 2017-08-25 DIAGNOSIS — Z1239 Encounter for other screening for malignant neoplasm of breast: Secondary | ICD-10-CM

## 2017-08-25 MED ORDER — VARENICLINE TARTRATE 1 MG PO TABS: 1.0000 mg | ORAL_TABLET | Freq: Two times a day (BID) | ORAL | 2 refills | Status: DC

## 2017-08-25 MED ORDER — VARENICLINE TARTRATE 0.5 MG X 11 & 1 MG X 42 PO MISC: ORAL | 0 refills | Status: DC

## 2017-08-25 NOTE — Assessment & Plan Note (Signed)
USPSTF grade A and B recommendations reviewed with patient; age-appropriate recommendations, preventive care, screening tests, etc discussed and encouraged; healthy living encouraged; see AVS for patient education given to patient  

## 2017-08-25 NOTE — Assessment & Plan Note (Signed)
Discussed different forms of smoking cessation with the patient, and I encouraged cessation. Patient requests trial of Chantix.  I discussed common side effects including but not limited to nausea/vomiting, vivid dreams, behaviour changes, depression, suicidal ideation; also explained possible increased cardiovascular risk. After discussion, patient opts to try quitting with Chantix. The 1-800-QUIT-NOW number given in patient instructions. Patient was encouraged to choose a quit date about 1 week after starting medicine. 

## 2017-08-25 NOTE — Patient Instructions (Addendum)
I do encourage you to quit smoking Call [phone] to sign up for smoking cessation classes You can call 1-800-QUIT-NOW to talk with a smoking cessation coach  If you have not heard anything from my staff in a week about any orders/referrals/studies from today, please contact us here to follow-up [phone]  Start the Chantix starter pack and take that for the first month, then fill the continuing maintenance pack and continue that for another 3 months Pick your quit date as day 8 after starting your medicine Good luck!    Health Maintenance, Female Adopting a healthy lifestyle and getting preventive care can go a long way to promote health and wellness. Talk with your health care provider about what schedule of regular examinations is right for you. This is a good chance for you to check in with your provider about disease prevention and staying healthy. In between checkups, there are plenty of things you can do on your own. Experts have done a lot of research about which lifestyle changes and preventive measures are most likely to keep you healthy. Ask your health care provider for more information. Weight and diet Eat a healthy diet  Be sure to include plenty of vegetables, fruits, low-fat dairy products, and lean protein.  Do not eat a lot of foods high in solid fats, added sugars, or salt.  Get regular exercise. This is one of the most important things you can do for your health. ? Most adults should exercise for at least 150 minutes each week. The exercise should increase your heart rate and make you sweat (moderate-intensity exercise). ? Most adults should also do strengthening exercises at least twice a week. This is in addition to the moderate-intensity exercise.  Maintain a healthy weight  Body mass index (BMI) is a measurement that can be used to identify possible weight problems. It estimates body fat based on height and weight. Your health care provider can help  determine your BMI and help you achieve or maintain a healthy weight.  For females 65 years of age and older: ? A BMI below 18.5 is considered underweight. ? A BMI of 18.5 to 24.9 is normal. ? A BMI of 25 to 29.9 is considered overweight. ? A BMI of 30 and above is considered obese.  Watch levels of cholesterol and blood lipids  You should start having your blood tested for lipids and cholesterol at 49 years of age, then have this test every 5 years.  You may need to have your cholesterol levels checked more often if: ? Your lipid or cholesterol levels are high. ? You are older than 49 years of age. ? You are at high risk for heart disease.  Cancer screening Lung Cancer  Lung cancer screening is recommended for adults 101-70 years old who are at high risk for lung cancer because of a history of smoking.  A yearly low-dose CT scan of the lungs is recommended for people who: ? Currently smoke. ? Have quit within the past 15 years. ? Have at least a 30-pack-year history of smoking. A pack year is smoking an average of one pack of cigarettes a day for 1 year.  Yearly screening should continue until it has been 15 years since you quit.  Yearly screening should stop if you develop a health problem that would prevent you from having lung cancer treatment.  Breast Cancer  Practice breast self-awareness. This means understanding how your breasts normally appear and feel.  It also means doing  regular breast self-exams. Let your health care provider know about any changes, no matter how small.  If you are in your 20s or 30s, you should have a clinical breast exam (CBE) by a health care provider every 1-3 years as part of a regular health exam.  If you are 42 or older, have a CBE every year. Also consider having a breast X-Krol (mammogram) every year.  If you have a family history of breast cancer, talk to your health care provider about genetic screening.  If you are at high risk for  breast cancer, talk to your health care provider about having an MRI and a mammogram every year.  Breast cancer gene (BRCA) assessment is recommended for women who have family members with BRCA-related cancers. BRCA-related cancers include: ? Breast. ? Ovarian. ? Tubal. ? Peritoneal cancers.  Results of the assessment will determine the need for genetic counseling and BRCA1 and BRCA2 testing.  Cervical Cancer Your health care provider may recommend that you be screened regularly for cancer of the pelvic organs (ovaries, uterus, and vagina). This screening involves a pelvic examination, including checking for microscopic changes to the surface of your cervix (Pap test). You may be encouraged to have this screening done every 3 years, beginning at age 57.  For women ages 56-65, health care providers may recommend pelvic exams and Pap testing every 3 years, or they may recommend the Pap and pelvic exam, combined with testing for human papilloma virus (HPV), every 5 years. Some types of HPV increase your risk of cervical cancer. Testing for HPV may also be done on women of any age with unclear Pap test results.  Other health care providers may not recommend any screening for nonpregnant women who are considered low risk for pelvic cancer and who do not have symptoms. Ask your health care provider if a screening pelvic exam is right for you.  If you have had past treatment for cervical cancer or a condition that could lead to cancer, you need Pap tests and screening for cancer for at least 20 years after your treatment. If Pap tests have been discontinued, your risk factors (such as having a new sexual partner) need to be reassessed to determine if screening should resume. Some women have medical problems that increase the chance of getting cervical cancer. In these cases, your health care provider may recommend more frequent screening and Pap tests.  Colorectal Cancer  This type of cancer can be  detected and often prevented.  Routine colorectal cancer screening usually begins at 49 years of age and continues through 49 years of age.  Your health care provider may recommend screening at an earlier age if you have risk factors for colon cancer.  Your health care provider may also recommend using home test kits to check for hidden blood in the stool.  A small camera at the end of a tube can be used to examine your colon directly (sigmoidoscopy or colonoscopy). This is done to check for the earliest forms of colorectal cancer.  Routine screening usually begins at age 19.  Direct examination of the colon should be repeated every 5-10 years through 49 years of age. However, you may need to be screened more often if early forms of precancerous polyps or small growths are found.  Skin Cancer  Check your skin from head to toe regularly.  Tell your health care provider about any new moles or changes in moles, especially if there is a change in a mole's  shape or color.  Also tell your health care provider if you have a mole that is larger than the size of a pencil eraser.  Always use sunscreen. Apply sunscreen liberally and repeatedly throughout the day.  Protect yourself by wearing long sleeves, pants, a wide-brimmed hat, and sunglasses whenever you are outside.  Heart disease, diabetes, and high blood pressure  High blood pressure causes heart disease and increases the risk of stroke. High blood pressure is more likely to develop in: ? People who have blood pressure in the high end of the normal range (130-139/85-89 mm Hg). ? People who are overweight or obese. ? People who are African American.  If you are 15-73 years of age, have your blood pressure checked every 3-5 years. If you are 31 years of age or older, have your blood pressure checked every year. You should have your blood pressure measured twice-once when you are at a hospital or clinic, and once when you are not at a  hospital or clinic. Record the average of the two measurements. To check your blood pressure when you are not at a hospital or clinic, you can use: ? An automated blood pressure machine at a pharmacy. ? A home blood pressure monitor.  If you are between 76 years and 28 years old, ask your health care provider if you should take aspirin to prevent strokes.  Have regular diabetes screenings. This involves taking a blood sample to check your fasting blood sugar level. ? If you are at a normal weight and have a low risk for diabetes, have this test once every three years after 49 years of age. ? If you are overweight and have a high risk for diabetes, consider being tested at a younger age or more often. Preventing infection Hepatitis B  If you have a higher risk for hepatitis B, you should be screened for this virus. You are considered at high risk for hepatitis B if: ? You were born in a country where hepatitis B is common. Ask your health care provider which countries are considered high risk. ? Your parents were born in a high-risk country, and you have not been immunized against hepatitis B (hepatitis B vaccine). ? You have HIV or AIDS. ? You use needles to inject street drugs. ? You live with someone who has hepatitis B. ? You have had sex with someone who has hepatitis B. ? You get hemodialysis treatment. ? You take certain medicines for conditions, including cancer, organ transplantation, and autoimmune conditions.  Hepatitis C  Blood testing is recommended for: ? Everyone born from [birth year] through 1967. ? Anyone with known risk factors for hepatitis C.  Sexually transmitted infections (STIs)  You should be screened for sexually transmitted infections (STIs) including gonorrhea and chlamydia if: ? You are sexually active and are younger than 49 years of age. ? You are older than 49 years of age and your health care provider tells you that you are at risk for this type of  infection. ? Your sexual activity has changed since you were last screened and you are at an increased risk for chlamydia or gonorrhea. Ask your health care provider if you are at risk.  If you do not have HIV, but are at risk, it may be recommended that you take a prescription medicine daily to prevent HIV infection. This is called pre-exposure prophylaxis (PrEP). You are considered at risk if: ? You are sexually active and do not regularly use condoms or know  the HIV status of your partner(s). ? You take drugs by injection. ? You are sexually active with a partner who has HIV.  Talk with your health care provider about whether you are at high risk of being infected with HIV. If you choose to begin PrEP, you should first be tested for HIV. You should then be tested every 3 months for as long as you are taking PrEP. Pregnancy  If you are premenopausal and you may become pregnant, ask your health care provider about preconception counseling.  If you may become pregnant, take 400 to 800 micrograms (mcg) of folic acid every day.  If you want to prevent pregnancy, talk to your health care provider about birth control (contraception). Osteoporosis and menopause  Osteoporosis is a disease in which the bones lose minerals and strength with aging. This can result in serious bone fractures. Your risk for osteoporosis can be identified using a bone density scan.  If you are 50 years of age or older, or if you are at risk for osteoporosis and fractures, ask your health care provider if you should be screened.  Ask your health care provider whether you should take a calcium or vitamin D supplement to lower your risk for osteoporosis.  Menopause may have certain physical symptoms and risks.  Hormone replacement therapy may reduce some of these symptoms and risks. Talk to your health care provider about whether hormone replacement therapy is right for you. Follow these instructions at home:  Schedule  regular health, dental, and eye exams.  Stay current with your immunizations.  Do not use any tobacco products including cigarettes, chewing tobacco, or electronic cigarettes.  If you are pregnant, do not drink alcohol.  If you are breastfeeding, limit how much and how often you drink alcohol.  Limit alcohol intake to no more than 1 drink per day for nonpregnant women. One drink equals 12 ounces of beer, 5 ounces of wine, or 1 ounces of hard liquor.  Do not use street drugs.  Do not share needles.  Ask your health care provider for help if you need support or information about quitting drugs.  Tell your health care provider if you often feel depressed.  Tell your health care provider if you have ever been abused or do not feel safe at home. This information is not intended to replace advice given to you by your health care provider. Make sure you discuss any questions you have with your health care provider. Document Released: 12/10/2010 Document Revised: 11/02/2015 Document Reviewed: 02/28/2015 Elsevier Interactive Patient Education  2018 [employer].  Steps to Quit Smoking Smoking tobacco can be bad for your health. It can also affect almost every organ in your body. Smoking puts you and people around you at risk for many serious long-lasting (chronic) diseases. Quitting smoking is hard, but it is one of the best things that you can do for your health. It is never too late to quit. What are the benefits of quitting smoking? When you quit smoking, you lower your risk for getting serious diseases and conditions. They can include:  Lung cancer or lung disease.  Heart disease.  Stroke.  Heart attack.  Not being able to have children (infertility).  Weak bones (osteoporosis) and broken bones (fractures).  If you have coughing, wheezing, and shortness of breath, those symptoms may get better when you quit. You may also get sick less often. If you are pregnant, quitting  smoking can help to lower your chances of having  a baby of low birth weight. What can I do to help me quit smoking? Talk with your doctor about what can help you quit smoking. Some things you can do (strategies) include:  Quitting smoking totally, instead of slowly cutting back how much you smoke over a period of time.  Going to in-person counseling. You are more likely to quit if you go to many counseling sessions.  Using resources and support systems, such as: ? Database administrator with a Social worker. ? Phone quitlines. ? Careers information officer. ? Support groups or group counseling. ? Text messaging programs. ? Mobile phone apps or applications.  Taking medicines. Some of these medicines may have nicotine in them. If you are pregnant or breastfeeding, do not take any medicines to quit smoking unless your doctor says it is okay. Talk with your doctor about counseling or other things that can help you.  Talk with your doctor about using more than one strategy at the same time, such as taking medicines while you are also going to in-person counseling. This can help make quitting easier. What things can I do to make it easier to quit? Quitting smoking might feel very hard at first, but there is a lot that you can do to make it easier. Take these steps:  Talk to your family and friends. Ask them to support and encourage you.  Call phone quitlines, reach out to support groups, or work with a Social worker.  Ask people who smoke to not smoke around you.  Avoid places that make you want (trigger) to smoke, such as: ? Bars. ? Parties. ? Smoke-break areas at work.  Spend time with people who do not smoke.  Lower the stress in your life. Stress can make you want to smoke. Try these things to help your stress: ? Getting regular exercise. ? Deep-breathing exercises. ? Yoga. ? Meditating. ? Doing a body scan. To do this, close your eyes, focus on one area of your body at a time from head to toe,  and notice which parts of your body are tense. Try to relax the muscles in those areas.  Download or buy apps on your mobile phone or tablet that can help you stick to your quit plan. There are many free apps, such as QuitGuide from the [employer] Office manager for Disease Control and Prevention). You can find more support from smokefree.gov and other websites.  This information is not intended to replace advice given to you by your health care provider. Make sure you discuss any questions you have with your health care provider. Document Released: 03/23/2009 Document Revised: 01/23/2016 Document Reviewed: 10/11/2014 Elsevier Interactive Patient Education  2018 [employer].

## 2017-08-25 NOTE — Progress Notes (Signed)
Patient ID: [name], female   DOB: 09/22/1970, 47 y.o.   MRN: [id number]   Subjective:   [name] is a 47 y.o. female here for a complete physical exam  Interim issues since last visit: had an acute illness; took abx; all finished; no diarrhea now, but did then  USPSTF grade A and B recommendations Depression:  Depression screen [facility] 2/9 08/25/2017 06/30/2017 06/12/2017 05/12/2017 05/06/2017  Decreased Interest 0 0 0 0 0  Down, Depressed, Hopeless 0 1 0 0 0  PHQ - 2 Score 0 1 0 0 0  Altered sleeping - - - - -  Tired, decreased energy - - - - -  Change in appetite - - - - -  Feeling bad or failure about yourself  - - - - -  Trouble concentrating - - - - -  Moving slowly or fidgety/restless - - - - -  Suicidal thoughts - - - - -  PHQ-9 Score - - - - -  Difficult doing work/chores - - - - -   Hypertension: BP Readings from Last 3 Encounters:  08/25/17 112/70  08/14/17 120/85  07/21/17 134/81   Obesity: Wt Readings from Last 3 Encounters:  08/25/17 182 lb 14.4 oz (83 kg)  08/14/17 180 lb (81.6 kg)  07/21/17 180 lb (81.6 kg)   BMI Readings from Last 3 Encounters:  08/25/17 29.19 kg/m  08/14/17 29.05 kg/m  07/21/17 29.05 kg/m     Skin cancer: rough keratotic on legs, nothing worrisome Lung cancer:  Smoker, about 1/2 ppd, trying to cut back; has not managed to quit before; smoking for years; no other smokers around Breast cancer: no lumps; mammo ordered Colorectal cancer: no fam hx colon cancer; start at age 28 Cervical cancer screening: today; hx of abnormal pap smear in her teens; had a hysterectomy 8-10 years ago; not sure if cervix remains; one ovary remains BRCA gene screening: family hx of breast and/or ovarian cancer and/or metastatic prostate cancer? Breast (aunt died in her 27's); solitary family member HIV, hep B, hep C: okay to check STD testing and prevention (chl/gon/syphilis): okay to check Intimate partner violence: no abuse Contraception: not sexually  active Osteoporosis: back shots all the time; passed on DEXA Fall prevention/vitamin D: discussed Immunizations: passed on flu shot; tetanus UTD Diet: trying to improve fiber; fruits and veggies; trying to limit saturated fats Exercise: not able to really exercise Alcohol: no Tobacco use: discussed AAA: n/a Aspirin: n/a Glucose: check today; last PO intake was yesterday Glucose  Date Value Ref Range Status  12/05/2016 85 65 - 99 mg/dL Final  09/18/2016 93 65 - 99 mg/dL Final  03/10/2015 89 65 - 99 mg/dL Final  04/16/2013 139 (H) 65 - 99 mg/dL Final  12/26/2011 166 (H) 65 - 99 mg/dL Final   Lipids:  Lab Results  Component Value Date   CHOL 207 (H) 09/18/2016   CHOL 213 (H) 03/10/2015   Lab Results  Component Value Date   HDL 78 09/18/2016   HDL 55 03/10/2015   Lab Results  Component Value Date   LDLCALC 117 (H) 09/18/2016   LDLCALC 128 (H) 03/10/2015   Lab Results  Component Value Date   TRIG 62 09/18/2016   TRIG 151 (H) 03/10/2015   Lab Results  Component Value Date   CHOLHDL 2.7 09/18/2016   No results found for: LDLDIRECT   Past Medical History:  Diagnosis Date  . Allergy   . Anxiety   . Chronic  bilateral low back pain 07/22/2016   Started in her 20's; managed by pain clinic  . Depression    Past Surgical History:  Procedure Laterality Date  . ABDOMINAL HYSTERECTOMY    . BACK SURGERY     two back sx one in [place] and one at [place]  . CESAREAN SECTION    . SPINE SURGERY    . SPINE SURGERY  09/09/2015  . TONSILLECTOMY     Family History  Problem Relation Age of Onset  . Hypertension Mother   . Diabetes Mother   . Hypertension Father   . Hyperlipidemia Father   . Arthritis Father   . Lung disease Father   . Heart disease Father   . Lymphoma Father   . Cancer Maternal Aunt        breast  . Depression Sister   . Heart disease Maternal Grandfather   . Depression Sister   . Stroke Neg Hx    Social History   Tobacco Use  . Smoking  status: Current Every Day Smoker    Packs/day: 1.00    Years: 20.00    Pack years: 20.00    Types: Cigarettes  . Smokeless tobacco: Never Used  Substance Use Topics  . Alcohol use: No  . Drug use: No   Review of Systems  Objective:   Vitals:   08/25/17 1403  BP: 112/70  Pulse: 99  Resp: 14  Temp: 98.1 F (36.7 C)  TempSrc: Oral  SpO2: 96%  Weight: 182 lb 14.4 oz (83 kg)  Height: 5' 6.38" (1.686 m)   Body mass index is 29.19 kg/m. Wt Readings from Last 3 Encounters:  08/25/17 182 lb 14.4 oz (83 kg)  08/14/17 180 lb (81.6 kg)  07/21/17 180 lb (81.6 kg)   Physical Exam  Constitutional: She appears well-developed and well-nourished.  HENT:  Head: Normocephalic and atraumatic.  Eyes: Conjunctivae and EOM are normal. Right eye exhibits no hordeolum. Left eye exhibits no hordeolum. No scleral icterus.  Neck: Carotid bruit is not present. No thyromegaly present.  Cardiovascular: Normal rate, regular rhythm, S1 normal, S2 normal and normal heart sounds.  No extrasystoles are present.  Pulmonary/Chest: Effort normal and breath sounds normal. No respiratory distress. Right breast exhibits no inverted nipple, no mass, no nipple discharge, no skin change and no tenderness. Left breast exhibits no inverted nipple, no mass, no nipple discharge, no skin change and no tenderness. Breasts are symmetrical.  Abdominal: Soft. Normal appearance and bowel sounds are normal. She exhibits no distension, no abdominal bruit, no pulsatile midline mass and no mass. There is no hepatosplenomegaly. There is no tenderness. No hernia.  Genitourinary: Pelvic exam was performed with patient prone. There is no rash or lesion on the right labia. There is no rash or lesion on the left labia. Cervix exhibits no motion tenderness, no discharge and no friability. Right adnexum displays no mass, no tenderness and no fullness. Left adnexum displays no mass, no tenderness and no fullness. Vaginal discharge (whitish  discharge; no foul odor) found.  Musculoskeletal: Normal range of motion. She exhibits no edema.  Lymphadenopathy:       Head (right side): No submandibular adenopathy present.       Head (left side): No submandibular adenopathy present.    She has no cervical adenopathy.    She has no axillary adenopathy.  Neurological: She is alert. She displays no tremor. No cranial nerve deficit. She exhibits normal muscle tone. Gait normal.  Skin: Skin is warm  and dry. No bruising noted. No cyanosis. No pallor.  Psychiatric: Her speech is normal and behavior is normal. Thought content normal. Her mood appears not anxious. She does not exhibit a depressed mood.    Assessment/Plan:   Problem List Items Addressed This Visit      Other   Tobacco use    Discussed different forms of smoking cessation with the patient, and I encouraged cessation. Patient requests trial of Chantix.  I discussed common side effects including but not limited to nausea/vomiting, vivid dreams, behaviour changes, depression, suicidal ideation; also explained possible increased cardiovascular risk. After discussion, patient opts to try quitting with Chantix. The 1-800-QUIT-NOW number given in patient instructions. Patient was encouraged to choose a quit date about 1 week after starting medicine.       Preventative health care - Primary    USPSTF grade A and B recommendations reviewed with patient; age-appropriate recommendations, preventive care, screening tests, etc discussed and encouraged; healthy living encouraged; see AVS for patient education given to patient      Relevant Orders   CBC with Differential/Platelet   COMPLETE METABOLIC PANEL WITH GFR   Lipid panel   TSH    Other Visit Diagnoses    Screening for breast cancer       Relevant Orders   MM Digital Screening   Screening for cervical cancer       Relevant Orders   Pap IG and HPV (high risk) DNA detection   Vaginal discharge       Relevant Orders   WET  PREP BY MOLECULAR PROBE       Meds ordered this encounter  Medications  . varenicline (CHANTIX STARTING MONTH PAK) 0.5 MG X 11 & 1 MG X 42 tablet    Sig: Take one 0.5 mg tablet by mouth once daily for 3 days, then increase to one 0.5 mg tablet twice daily for 4 days, then increase to one 1 mg tablet twice daily.    Dispense:  53 tablet    Refill:  0  . varenicline (CHANTIX CONTINUING MONTH PAK) 1 MG tablet    Sig: Take 1 tablet (1 mg total) by mouth 2 (two) times daily.    Dispense:  60 tablet    Refill:  2    Fill AFTER she finishes the starter month taper pack   Orders Placed This Encounter  Procedures  . WET PREP BY MOLECULAR PROBE  . MM Digital Screening    Standing Status:   Future    Standing Expiration Date:   10/26/2018    Order Specific Question:   Reason for Exam (SYMPTOM  OR DIAGNOSIS REQUIRED)    Answer:   screen for breast cancer    Order Specific Question:   Is the patient pregnant?    Answer:   No    Order Specific Question:   Preferred imaging location?    Answer:   [facility]  . CBC with Differential/Platelet  . COMPLETE METABOLIC PANEL WITH GFR  . Lipid panel  . TSH   3-10 minutes spent counseling on smoking cessation  Follow up plan: Return in about 1 year (around 08/26/2018) for complete physical; 4 weeks for smoking cessation.  An After Visit Summary was printed and given to the patient.

## 2017-08-26 LAB — WET PREP BY MOLECULAR PROBE
CANDIDA SPECIES: NOT DETECTED
GARDNERELLA VAGINALIS: NOT DETECTED
MICRO NUMBER:: 90342014
SOURCE:: 0
SPECIMEN QUALITY: ADEQUATE
TRICHOMONAS VAG: NOT DETECTED

## 2017-08-27 ENCOUNTER — Telehealth: Payer: Self-pay

## 2017-08-27 NOTE — Telephone Encounter (Signed)
-----   Message from Arnetha Courser, MD sent at 08/26/2017  5:16 PM EDT ----- Guerry Minors, please let pt know that her wet prep was negative/normal; thank you

## 2017-08-27 NOTE — Telephone Encounter (Signed)
Called pt no answer, no voicemail set up. Will Call again. CRM created. Labs routed to [facility].

## 2017-08-28 LAB — PAP IG AND HPV HIGH-RISK: HPV DNA HIGH RISK: NOT DETECTED

## 2017-08-28 NOTE — Telephone Encounter (Signed)
Called pt, no answer. LM for pt informing her of the information below per Dr.Lada. CRM created.

## 2017-08-29 ENCOUNTER — Telehealth: Payer: Self-pay

## 2017-08-29 NOTE — Telephone Encounter (Signed)
-----   Message from Arnetha Courser, MD sent at 08/28/2017  4:11 PM EDT ----- Please let pt know that her pap smear did not show any cancerous or precancerous cells; her HPV was negative; next pap in 3 years

## 2017-08-29 NOTE — Telephone Encounter (Signed)
Called pt, informed her of negative-negative  PAP. Pt gave verbal understanding.

## 2017-09-08 ENCOUNTER — Ambulatory Visit: Payer: Managed Care, Other (non HMO) | Attending: Nurse Practitioner | Admitting: Nurse Practitioner

## 2017-09-08 ENCOUNTER — Encounter: Payer: Self-pay | Admitting: Nurse Practitioner

## 2017-09-08 ENCOUNTER — Other Ambulatory Visit: Payer: Self-pay

## 2017-09-08 VITALS — BP 125/102 | HR 87 | Temp 98.7°F | Ht 66.0 in | Wt 180.0 lb

## 2017-09-08 DIAGNOSIS — M47896 Other spondylosis, lumbar region: Secondary | ICD-10-CM | POA: Insufficient documentation

## 2017-09-08 DIAGNOSIS — M47816 Spondylosis without myelopathy or radiculopathy, lumbar region: Secondary | ICD-10-CM

## 2017-09-08 DIAGNOSIS — Z791 Long term (current) use of non-steroidal anti-inflammatories (NSAID): Secondary | ICD-10-CM | POA: Diagnosis not present

## 2017-09-08 DIAGNOSIS — M2550 Pain in unspecified joint: Secondary | ICD-10-CM | POA: Diagnosis not present

## 2017-09-08 DIAGNOSIS — G894 Chronic pain syndrome: Secondary | ICD-10-CM | POA: Diagnosis not present

## 2017-09-08 DIAGNOSIS — M47817 Spondylosis without myelopathy or radiculopathy, lumbosacral region: Secondary | ICD-10-CM | POA: Diagnosis not present

## 2017-09-08 DIAGNOSIS — E559 Vitamin D deficiency, unspecified: Secondary | ICD-10-CM | POA: Diagnosis not present

## 2017-09-08 DIAGNOSIS — M48061 Spinal stenosis, lumbar region without neurogenic claudication: Secondary | ICD-10-CM | POA: Diagnosis not present

## 2017-09-08 DIAGNOSIS — M7918 Myalgia, other site: Secondary | ICD-10-CM | POA: Diagnosis not present

## 2017-09-08 DIAGNOSIS — M533 Sacrococcygeal disorders, not elsewhere classified: Secondary | ICD-10-CM | POA: Diagnosis not present

## 2017-09-08 DIAGNOSIS — F329 Major depressive disorder, single episode, unspecified: Secondary | ICD-10-CM | POA: Insufficient documentation

## 2017-09-08 DIAGNOSIS — Z79899 Other long term (current) drug therapy: Secondary | ICD-10-CM | POA: Diagnosis not present

## 2017-09-08 DIAGNOSIS — M545 Low back pain: Secondary | ICD-10-CM | POA: Insufficient documentation

## 2017-09-08 DIAGNOSIS — Z5181 Encounter for therapeutic drug level monitoring: Secondary | ICD-10-CM | POA: Diagnosis not present

## 2017-09-08 DIAGNOSIS — M5442 Lumbago with sciatica, left side: Secondary | ICD-10-CM

## 2017-09-08 DIAGNOSIS — Z885 Allergy status to narcotic agent status: Secondary | ICD-10-CM | POA: Insufficient documentation

## 2017-09-08 DIAGNOSIS — F419 Anxiety disorder, unspecified: Secondary | ICD-10-CM | POA: Insufficient documentation

## 2017-09-08 DIAGNOSIS — G8929 Other chronic pain: Secondary | ICD-10-CM

## 2017-09-08 DIAGNOSIS — F4321 Adjustment disorder with depressed mood: Secondary | ICD-10-CM | POA: Insufficient documentation

## 2017-09-08 DIAGNOSIS — M25552 Pain in left hip: Secondary | ICD-10-CM | POA: Insufficient documentation

## 2017-09-08 DIAGNOSIS — Z79891 Long term (current) use of opiate analgesic: Secondary | ICD-10-CM | POA: Insufficient documentation

## 2017-09-08 DIAGNOSIS — F17210 Nicotine dependence, cigarettes, uncomplicated: Secondary | ICD-10-CM | POA: Insufficient documentation

## 2017-09-08 MED ORDER — DULOXETINE HCL 30 MG PO CPEP: 30.0000 mg | ORAL_CAPSULE | Freq: Two times a day (BID) | ORAL | 0 refills | Status: DC

## 2017-09-08 MED ORDER — GABAPENTIN 300 MG PO CAPS: 300.0000 mg | ORAL_CAPSULE | Freq: Three times a day (TID) | ORAL | 0 refills | Status: DC

## 2017-09-08 MED ORDER — MELOXICAM 15 MG PO TABS: 15.0000 mg | ORAL_TABLET | Freq: Every day | ORAL | 0 refills | Status: DC

## 2017-09-08 MED ORDER — OXYCODONE-ACETAMINOPHEN 10-325 MG PO TABS: 1.0000 | ORAL_TABLET | Freq: Four times a day (QID) | ORAL | 0 refills | Status: DC | PRN

## 2017-09-08 MED ORDER — CYCLOBENZAPRINE HCL 10 MG PO TABS: 10.0000 mg | ORAL_TABLET | Freq: Every day | ORAL | 0 refills | Status: DC

## 2017-09-08 NOTE — Progress Notes (Addendum)
Patient's Name: [name]  MRN: [id number]  Referring Provider: Arnetha Courser, MD  DOB: [birth year]  PCP: Arnetha Courser, MD  DOS: 09/08/2017  Note by: Vevelyn Francois NP  Service setting: Ambulatory outpatient  Specialty: Interventional Pain Management  Location: [name] (AMB) Pain Management Facility    Patient type: Established    Primary Reason(s) for Visit: Encounter for prescription drug management. (Level of risk: moderate)  CC: Back Pain (lower/mid)  HPI  Ms. Kassa is a 47 y.o. year old, female patient, who comes today for a medication management evaluation. She has Adjustment disorder with depressed mood; Stressful life events affecting family and household; Vitamin D deficiency; Lumbosacral spondylosis without myelopathy; Lumbar central spinal stenosis (L4-5); Breast cancer screening; Bladder irritation; Chronic low back pain (Primary Source of Pain) (midline); Arthralgia of multiple joints; Chronic, continuous use of opioids; Elevated rheumatoid factor; Chronic pain syndrome; Long term (current) use of opiate analgesic; Long term prescription opiate use; Opiate use (85.5 MME/Day); Chronic hip pain (Secondary source of pain) (Left); Failed back surgical syndrome; Discogenic low back pain; Lumbar discogenic pain syndrome; Chronic sacroiliac joint pain (Left); Chronic musculoskeletal pain; Lumbar facet syndrome (Bilateral); Lumbar spondylosis; Neurogenic pain; Depression; Acute postoperative pain; Trigger point with back pain (Left); Tobacco use; and Preventative health care on their problem list. Her primarily concern today is the Back Pain (lower/mid)  Pain Assessment: Location: Mid, Lower Back Radiating: Denies Onset: More than a month ago Duration: Chronic pain Quality: Constant, Aching, Discomfort Severity: 3 /10 (self-reported pain score)  Note: Reported level is compatible with observation.                          Timing: Constant Modifying factors: heating pad, hot shower,laying  down, meds  Ms. Chopin was last scheduled for an appointment on 08/14/2017 for medication management. During today's appointment we reviewed Ms. Durden's chronic pain status, as well as her outpatient medication regimen. She admits that she arthritis and the current regimen is not working. She feels like this pain in the middle of her back is deliberating.  She denies any injuries. She denies any pain radiating into her legs. She is prescribed Meloxicam but does not take this daily.  She admits that the Gabapentin helps her to rest.   The patient  reports that she does not use drugs. Her body mass index is 29.05 kg/m.  Further details on both, my assessment(s), as well as the proposed treatment plan, please see below.  Controlled Substance Pharmacotherapy Assessment REMS (Risk Evaluation and Mitigation Strategy)  Analgesic:Oxycodone/acetaminophenIR 10/'325mg'$  every 6 hours ('40mg'$ /dayof oxycodone) (60MME/day) MME/day:'60mg'$ /day.    Chauncey Fischer, RN  09/08/2017  2:27 PM  Signed Nursing Pain Medication Assessment:  Safety precautions to be maintained throughout the outpatient stay will include: orient to surroundings, keep bed in low position, maintain call bell within reach at all times, provide assistance with transfer out of bed and ambulation.  Medication Inspection Compliance: Pill count conducted under aseptic conditions, in front of the patient. Neither the pills nor the bottle was removed from the patient's sight at any time. Once count was completed pills were immediately returned to the patient in their original bottle.  Medication: Oxycodone/APAP Pill/Patch Count: 52 1/2 of 120 pills remain Pill/Patch Appearance: Markings consistent with prescribed medication Bottle Appearance: Standard pharmacy container. Clearly labeled. Filled Date: 03 / 15 / 2018 Last Medication intake:  Today   Pharmacokinetics: Liberation and absorption (onset of action): WNL  Distribution (time to peak  effect): WNL Metabolism and excretion (duration of action): WNL         Pharmacodynamics: Desired effects: Analgesia: Ms. Mino reports >50% benefit. Functional ability: Patient reports that medication allows her to accomplish basic ADLs Clinically meaningful improvement in function (CMIF): Sustained CMIF goals met Perceived effectiveness: Described as relatively effective, allowing for increase in activities of daily living (ADL) Undesirable effects: Side-effects or Adverse reactions: None reported Monitoring: [place] PMP: Online review of the past 21-monthperiod conducted. Compliant with practice rules and regulations Last UDS on record: Summary  Date Value Ref Range Status  08/14/2017 FINAL  Final    Comment:    ==================================================================== TOXASSURE SELECT 13 (MW) ==================================================================== Test                             Result       Flag       Units Drug Present and Declared for Prescription Verification   Oxycodone                      >2597        EXPECTED   ng/mg creat   Oxymorphone                    905          EXPECTED   ng/mg creat   Noroxycodone                   >2597        EXPECTED   ng/mg creat   Noroxymorphone                 1255         EXPECTED   ng/mg creat    Sources of oxycodone are scheduled prescription medications.    Oxymorphone, noroxycodone, and noroxymorphone are expected    metabolites of oxycodone. Oxymorphone is also available as a    scheduled prescription medication. ==================================================================== Test                      Result    Flag   Units      Ref Range   Creatinine              385              mg/dL      >=20 ==================================================================== Declared Medications:  The flagging and interpretation on this report are based on the  following declared medications.  Unexpected results may  arise from  inaccuracies in the declared medications.  **Note: The testing scope of this panel includes these medications:  Oxycodone (Percocet)  **Note: The testing scope of this panel does not include following  reported medications:  Acetaminophen (Percocet)  Cyclobenzaprine (Flexeril)  Duloxetine (Cymbalta)  Meloxicam (Mobic) ==================================================================== For clinical consultation, please call [phone] ====================================================================    UDS interpretation: Compliant          Medication Assessment Form: Reviewed. Patient indicates being compliant with therapy Treatment compliance: Compliant Risk Assessment Profile: Aberrant behavior: See prior evaluations. None observed or detected today Comorbid factors increasing risk of overdose: See prior notes. No additional risks detected today Risk of substance use disorder (SUD): Low Opioid Risk Tool - 07/21/17 0839      Family History of Substance Abuse   Alcohol  Negative    Illegal Drugs  Negative  Rx Drugs  Negative      Personal History of Substance Abuse   Alcohol  Negative    Illegal Drugs  Negative    Rx Drugs  Negative      Psychological Disease   Psychological Disease  Negative    Depression  Negative      Total Score   Opioid Risk Tool Scoring  0    Opioid Risk Interpretation  Low Risk      ORT Scoring interpretation table:  Score <3 = Low Risk for SUD  Score between 4-7 = Moderate Risk for SUD  Score >8 = High Risk for Opioid Abuse   Risk Mitigation Strategies:  Patient Counseling: Covered Patient-Prescriber Agreement (PPA): Present and active  Notification to other healthcare providers: Done  Pharmacologic Plan: No change in therapy, at this time.             Laboratory Chemistry  Inflammation Markers (CRP: Acute Phase) (ESR: Chronic Phase) Lab Results  Component Value Date   CRP 5.3 (H) 12/05/2016   ESRSEDRATE 9  12/05/2016                         Rheumatology Markers Lab Results  Component Value Date   RF 17.9 (H) 12/05/2016   ANA NEG 10/02/2016   LABURIC 2.5 09/18/2016                        Renal Function Markers Lab Results  Component Value Date   BUN 17 12/05/2016   CREATININE 0.84 12/05/2016   GFRAA 96 12/05/2016   GFRNONAA 83 12/05/2016                              Hepatic Function Markers Lab Results  Component Value Date   AST 21 12/05/2016   ALT 20 12/05/2016   ALBUMIN 4.4 12/05/2016   ALKPHOS 59 12/05/2016   LIPASE 107 12/26/2011                        Electrolytes Lab Results  Component Value Date   NA 139 12/05/2016   K 4.6 12/05/2016   CL 103 12/05/2016   CALCIUM 9.5 12/05/2016   MG 2.1 12/05/2016   PHOS 3.0 09/18/2016                        Neuropathy Markers Lab Results  Component Value Date   VITAMINB12 495 12/05/2016                        Bone Pathology Markers Lab Results  Component Value Date   VD25OH 27.1 (L) 09/18/2016   25OHVITD1 49 12/05/2016   25OHVITD2 <1.0 12/05/2016   25OHVITD3 49 12/05/2016                         Coagulation Parameters Lab Results  Component Value Date   INR 0.9 04/16/2013   LABPROT 12.4 04/16/2013   PLT 287 10/02/2016                        Cardiovascular Markers Lab Results  Component Value Date   CKTOTAL 75 04/16/2013   CKMB 0.6 04/16/2013   TROPONINI < 0.02 04/16/2013   HGB 13.3 10/02/2016   HCT 40.8 10/02/2016  CA Markers No results found for: CEA, CA125, LABCA2                      Note: Lab results reviewed.  Recent Diagnostic Imaging Results  DG C-Arm 1-60 Min-No Report Fluoroscopy was utilized by the requesting physician.  No radiographic  interpretation.   Complexity Note: Imaging results reviewed. Results shared with Ms. Purifoy, using Layman's terms.                         Meds   Current Outpatient Medications:  .  cyclobenzaprine (FLEXERIL) 10 MG tablet,  Take 1 tablet (10 mg total) by mouth at bedtime., Disp: 30 tablet, Rfl: 0 .  DULoxetine (CYMBALTA) 30 MG capsule, Take 1 capsule (30 mg total) by mouth 2 (two) times daily., Disp: 60 capsule, Rfl: 0 .  gabapentin (NEURONTIN) 300 MG capsule, Take 1 capsule (300 mg total) by mouth 3 (three) times daily. 2 caps at bedtime. May increase to 3 tabs at night, Disp: 90 capsule, Rfl: 0 .  meloxicam (MOBIC) 15 MG tablet, Take 1 tablet (15 mg total) by mouth daily., Disp: 30 tablet, Rfl: 0 .  oxyCODONE-acetaminophen (PERCOCET) 10-325 MG tablet, Take 1 tablet by mouth every 6 (six) hours as needed for pain., Disp: 120 tablet, Rfl: 0 .  varenicline (CHANTIX CONTINUING MONTH PAK) 1 MG tablet, Take 1 tablet (1 mg total) by mouth 2 (two) times daily., Disp: 60 tablet, Rfl: 2 .  varenicline (CHANTIX STARTING MONTH PAK) 0.5 MG X 11 & 1 MG X 42 tablet, Take one 0.5 mg tablet by mouth once daily for 3 days, then increase to one 0.5 mg tablet twice daily for 4 days, then increase to one 1 mg tablet twice daily., Disp: 53 tablet, Rfl: 0  ROS  Constitutional: Denies any fever or chills Gastrointestinal: No reported hemesis, hematochezia, vomiting, or acute GI distress Musculoskeletal: Denies any acute onset joint swelling, redness, loss of ROM, or weakness Neurological: No reported episodes of acute onset apraxia, aphasia, dysarthria, agnosia, amnesia, paralysis, loss of coordination, or loss of consciousness  Allergies  Ms. Risenhoover is allergic to codeine.  PFSH  Drug: Ms. Mozingo  reports that she does not use drugs. Alcohol:  reports that she does not drink alcohol. Tobacco:  reports that she has been smoking cigarettes.  She has a 20.00 pack-year smoking history. She has never used smokeless tobacco. Medical:  has a past medical history of Allergy, Anxiety, Chronic bilateral low back pain (07/22/2016), and Depression. Surgical: Ms. Naraine  has a past surgical history that includes Abdominal hysterectomy; Tonsillectomy;  Cesarean section; Spine surgery; Spine surgery (09/09/2015); and Back surgery. Family: family history includes Arthritis in her father; Cancer in her maternal aunt; Depression in her sister and sister; Diabetes in her mother; Heart disease in her father and maternal grandfather; Hyperlipidemia in her father; Hypertension in her father and mother; Lung disease in her father; Lymphoma in her father.  Constitutional Exam  General appearance: Well nourished, well developed, and well hydrated. In no apparent acute distress Vitals:   09/08/17 1303  BP: (!) 125/102  Pulse: 87  Temp: 98.7 F (37.1 C)  SpO2: 100%  Weight: 180 lb (81.6 kg)  Height: '5\' 6"'$  (1.676 m)  Psych/Mental status: Alert, oriented x 3 (person, place, & time)       Eyes: PERLA Respiratory: No evidence of acute respiratory distress  Lumbar Spine Area Exam  Skin & Axial Inspection:  Well healed scar from previous spine surgery detected Alignment: Symmetrical Functional ROM: Unrestricted ROM      Stability: No instability detected Muscle Tone/Strength: Functionally intact. No obvious neuro-muscular anomalies detected. Sensory (Neurological): Unimpaired Palpation: Complains of area being tender to palpation       Provocative Tests: Lumbar Hyperextension and rotation test: evaluation deferred today       Lumbar Lateral bending test: evaluation deferred today       Patrick's Maneuver: evaluation deferred today                    Gait & Posture Assessment  Ambulation: Unassisted Gait: Relatively normal for age and body habitus Posture: WNL   Lower Extremity Exam    Side: Right lower extremity  Side: Left lower extremity  Skin & Extremity Inspection: Skin color, temperature, and hair growth are WNL. No peripheral edema or cyanosis. No masses, redness, swelling, asymmetry, or associated skin lesions. No contractures.  Skin & Extremity Inspection: Skin color, temperature, and hair growth are WNL. No peripheral edema or cyanosis.  No masses, redness, swelling, asymmetry, or associated skin lesions. No contractures.  Functional ROM: Unrestricted ROM          Functional ROM: Unrestricted ROM          Muscle Tone/Strength: Functionally intact. No obvious neuro-muscular anomalies detected.  Muscle Tone/Strength: Functionally intact. No obvious neuro-muscular anomalies detected.  Sensory (Neurological): Unimpaired  Sensory (Neurological): Unimpaired  Palpation: No palpable anomalies  Palpation: No palpable anomalies   Assessment  Primary Diagnosis & Pertinent Problem List: The primary encounter diagnosis was Lumbar spondylosis. Diagnoses of Arthralgia of multiple joints, Chronic sacroiliac joint pain (Left), Chronic hip pain (Secondary source of pain) (Left), Chronic low back pain (Location of Primary Source of Pain) (midline), Lumbar facet syndrome (Bilateral), Chronic low back pain (Primary Source of Pain) (midline), Chronic pain syndrome, and Musculoskeletal pain were also pertinent to this visit.  Status Diagnosis  Persistent Worsening Persistent 1. Lumbar spondylosis   2. Arthralgia of multiple joints   3. Chronic sacroiliac joint pain (Left)   4. Chronic hip pain (Secondary source of pain) (Left)   5. Chronic low back pain (Location of Primary Source of Pain) (midline)   6. Lumbar facet syndrome (Bilateral)   7. Chronic low back pain (Primary Source of Pain) (midline)   8. Chronic pain syndrome   9. Musculoskeletal pain     Problems updated and reviewed during this visit: No problems updated. Plan of Care  Pharmacotherapy (Medications Ordered): Meds ordered this encounter  Medications  . meloxicam (MOBIC) 15 MG tablet    Sig: Take 1 tablet (15 mg total) by mouth daily.    Dispense:  30 tablet    Refill:  0    Do not place medication on "Automatic Refill". Fill one day early if pharmacy is closed on scheduled refill date.    Order Specific Question:   Supervising Provider    Answer:   Milinda Pointer  [phone]  . oxyCODONE-acetaminophen (PERCOCET) 10-325 MG tablet    Sig: Take 1 tablet by mouth every 6 (six) hours as needed for pain.    Dispense:  120 tablet    Refill:  0    Do not place this medication, or any other prescription from our practice, on "Automatic Refill". Patient may have prescription filled one day early if pharmacy is closed on scheduled refill date. Do not fill until: 09/21/2017 To last until: 10/21/2017    Order Specific Question:  Supervising Provider    Answer:   Milinda Pointer [phone]  . DULoxetine (CYMBALTA) 30 MG capsule    Sig: Take 1 capsule (30 mg total) by mouth 2 (two) times daily.    Dispense:  60 capsule    Refill:  0    Do not place medication on "Automatic Refill". Fill one day early if pharmacy is closed on scheduled refill date.    Order Specific Question:   Supervising Provider    Answer:   Milinda Pointer [phone]  . cyclobenzaprine (FLEXERIL) 10 MG tablet    Sig: Take 1 tablet (10 mg total) by mouth at bedtime.    Dispense:  30 tablet    Refill:  0    Do not place this medication, or any other prescription from our practice, on "Automatic Refill". Patient may have prescription filled one day early if pharmacy is closed on scheduled refill date.    Order Specific Question:   Supervising Provider    Answer:   Milinda Pointer [phone]  . gabapentin (NEURONTIN) 300 MG capsule    Sig: Take 1 capsule (300 mg total) by mouth 3 (three) times daily. 2 caps at bedtime. May increase to 3 tabs at night    Dispense:  90 capsule    Refill:  0    Order Specific Question:   Supervising Provider    Answer:   Milinda Pointer [[id number]]   New Prescriptions   No medications on file   Medications administered today: Nabilah Davoli. Sanchez had no medications administered during this visit. Lab-work, procedure(s), and/or referral(s): No orders of the defined types were placed in this encounter.  Imaging and/or referral(s): None  Interventional management  options: Planned, scheduled, and/or pending:  Not at this time    Considering:  Diagnostic bilateral lumbar facet block Possible bilateral lumbar facet RFA Diagnostic left sided caudal epidural steroid injection + diagnostic epidurogram Possible left-sided Racz procedure Diagnostic left L4-5 interlaminar lumbar epidural steroid injection Diagnosticleft L4-5 transforaminal epidural steroid injection Diagnostic left L4 selective nerve root block Possible left L4 nerve root ganglion RFA Possible lumbar spinal cord stimulator trial   Palliative PRN treatment(s):  Diagnostic bilateral lumbar facet block(NO STEROIDS      Provider-requested follow-up: Return in about 1 month (around 10/06/2017) for MedMgmt with Me Donella Stade [facility]).  No future appointments. Primary Care Physician: Arnetha Courser, MD Location: [facility] Note by: Vevelyn Francois NP Date: 09/08/2017; Time: 9:25 AM  Pain Score Disclaimer: We use the NRS-11 scale. This is a self-reported, subjective measurement of pain severity with only modest accuracy. It is used primarily to identify changes within a particular patient. It must be understood that outpatient pain scales are significantly less accurate that those used for research, where they can be applied under ideal controlled circumstances with minimal exposure to variables. In reality, the score is likely to be a combination of pain intensity and pain affect, where pain affect describes the degree of emotional arousal or changes in action readiness caused by the sensory experience of pain. Factors such as social and work situation, setting, emotional state, anxiety levels, expectation, and prior pain experience may influence pain perception and show large inter-individual differences that may also be affected by time variables.  Patient instructions provided during this appointment: Patient Instructions    ____________________________________________________________________________________________  Medication Rules  Applies to: All patients receiving prescriptions (written or electronic).  Pharmacy of record: Pharmacy where electronic prescriptions will be sent. If written prescriptions are taken  to a different pharmacy, please inform the nursing staff. The pharmacy listed in the electronic medical record should be the one where you would like electronic prescriptions to be sent.  Prescription refills: Only during scheduled appointments. Applies to both, written and electronic prescriptions.  NOTE: The following applies primarily to controlled substances (Opioid* Pain Medications).   Patient's responsibilities: 1. Pain Pills: Bring all pain pills to every appointment (except for procedure appointments). 2. Pill Bottles: Bring pills in original pharmacy bottle. Always bring newest bottle. Bring bottle, even if empty. 3. Medication refills: You are responsible for knowing and keeping track of what medications you need refilled. The day before your appointment, write a list of all prescriptions that need to be refilled. Bring that list to your appointment and give it to the admitting nurse. Prescriptions will be written only during appointments. If you forget a medication, it will not be "Called in", "Faxed", or "electronically sent". You will need to get another appointment to get these prescribed. 4. Prescription Accuracy: You are responsible for carefully inspecting your prescriptions before leaving our office. Have the discharge nurse carefully go over each prescription with you, before taking them home. Make sure that your name is accurately spelled, that your address is correct. Check the name and dose of your medication to make sure it is accurate. Check the number of pills, and the written instructions to make sure they are clear and accurate. Make sure that you are given enough medication to  last until your next medication refill appointment. 5. Taking Medication: Take medication as prescribed. Never take more pills than instructed. Never take medication more frequently than prescribed. Taking less pills or less frequently is permitted and encouraged, when it comes to controlled substances (written prescriptions).  6. Inform other Doctors: Always inform, all of your healthcare providers, of all the medications you take. 7. Pain Medication from other Providers: You are not allowed to accept any additional pain medication from any other Doctor or Healthcare provider. There are two exceptions to this rule. (see below) In the event that you require additional pain medication, you are responsible for notifying us, as stated below. 8. Medication Agreement: You are responsible for carefully reading and following our Medication Agreement. This must be signed before receiving any prescriptions from our practice. Safely store a copy of your signed Agreement. Violations to the Agreement will result in no further prescriptions. (Additional copies of our Medication Agreement are available upon request.) 9. Laws, Rules, & Regulations: All patients are expected to follow all Federal and [employer], Rules, [employer]. Ignorance of the Laws does not constitute a valid excuse. The use of any illegal substances is prohibited. 10. Adopted CDC guidelines & recommendations: Target dosing levels will be at or below 60 MME/day. Use of benzodiazepines** is not recommended.  Exceptions: There are only two exceptions to the rule of not receiving pain medications from other Healthcare Providers. 1. Exception #1 (Emergencies): In the event of an emergency (i.e.: accident requiring emergency care), you are allowed to receive additional pain medication. However, you are responsible for: As soon as you are able, call our office [phone], at any time of the day or night, and leave a message stating your  name, the date and nature of the emergency, and the name and dose of the medication prescribed. In the event that your call is answered by a member of our staff, make sure to document and save the date, time, and the name of the person  that took your information.  2. Exception #2 (Planned Surgery): In the event that you are scheduled by another doctor or dentist to have any type of surgery or procedure, you are allowed (for a period no longer than 30 days), to receive additional pain medication, for the acute post-op pain. However, in this case, you are responsible for picking up a copy of our "Post-op Pain Management for Surgeons" handout, and giving it to your surgeon or dentist. This document is available at our office, and does not require an appointment to obtain it. Simply go to our office during business hours (Monday-Thursday from 8:00 AM to 4:00 PM) (Friday 8:00 AM to 12:00 Noon) or if you have a scheduled appointment with [place], prior to your surgery, and ask for it by name. In addition, you will need to provide [place] with your name, name of your surgeon, type of surgery, and date of procedure or surgery.  *Opioid medications include: morphine, codeine, oxycodone, oxymorphone, hydrocodone, hydromorphone, meperidine, tramadol, tapentadol, buprenorphine, fentanyl, methadone. **Benzodiazepine medications include: diazepam (Valium), alprazolam (Xanax), clonazepam (Klonopine), lorazepam (Ativan), clorazepate (Tranxene), chlordiazepoxide (Librium), estazolam (Prosom), oxazepam (Serax), temazepam (Restoril), triazolam (Halcion) (Last updated: 08/07/2017) ____________________________________________________________________________________________    BMI Assessment: Estimated body mass index is 29.05 kg/m as calculated from the following:   Height as of this encounter: '5\' 6"'$  (1.676 m).   Weight as of this encounter: 180 lb (81.6 kg).  BMI interpretation table: BMI level Category Range association with  higher incidence of chronic pain  <18 kg/m2 Underweight   18.5-24.9 kg/m2 Ideal body weight   25-29.9 kg/m2 Overweight Increased incidence by 20%  30-34.9 kg/m2 Obese (Class I) Increased incidence by 68%  35-39.9 kg/m2 Severe obesity (Class II) Increased incidence by 136%  >40 kg/m2 Extreme obesity (Class III) Increased incidence by 254%   BMI Readings from Last 4 Encounters:  09/08/17 29.05 kg/m  08/25/17 29.19 kg/m  08/14/17 29.05 kg/m  07/21/17 29.05 kg/m   Wt Readings from Last 4 Encounters:  09/08/17 180 lb (81.6 kg)  08/25/17 182 lb 14.4 oz (83 kg)  08/14/17 180 lb (81.6 kg)  07/21/17 180 lb (81.6 kg)

## 2017-09-08 NOTE — Progress Notes (Signed)
Nursing Pain Medication Assessment:  Safety precautions to be maintained throughout the outpatient stay will include: orient to surroundings, keep bed in low position, maintain call bell within reach at all times, provide assistance with transfer out of bed and ambulation.  Medication Inspection Compliance: Pill count conducted under aseptic conditions, in front of the patient. Neither the pills nor the bottle was removed from the patient's sight at any time. Once count was completed pills were immediately returned to the patient in their original bottle.  Medication: Oxycodone/APAP Pill/Patch Count: 52 1/2 of 120 pills remain Pill/Patch Appearance: Markings consistent with prescribed medication Bottle Appearance: Standard pharmacy container. Clearly labeled. Filled Date: 03 / 15 / 2018 Last Medication intake:  Today

## 2017-09-08 NOTE — Patient Instructions (Signed)
____________________________________________________________________________________________  Medication Rules  Applies to: All patients receiving prescriptions (written or electronic).  Pharmacy of record: Pharmacy where electronic prescriptions will be sent. If written prescriptions are taken to a different pharmacy, please inform the nursing staff. The pharmacy listed in the electronic medical record should be the one where you would like electronic prescriptions to be sent.  Prescription refills: Only during scheduled appointments. Applies to both, written and electronic prescriptions.  NOTE: The following applies primarily to controlled substances (Opioid* Pain Medications).   Patient's responsibilities: 1. Pain Pills: Bring all pain pills to every appointment (except for procedure appointments). 2. Pill Bottles: Bring pills in original pharmacy bottle. Always bring newest bottle. Bring bottle, even if empty. 3. Medication refills: You are responsible for knowing and keeping track of what medications you need refilled. The day before your appointment, write a list of all prescriptions that need to be refilled. Bring that list to your appointment and give it to the admitting nurse. Prescriptions will be written only during appointments. If you forget a medication, it will not be "Called in", "Faxed", or "electronically sent". You will need to get another appointment to get these prescribed. 4. Prescription Accuracy: You are responsible for carefully inspecting your prescriptions before leaving our office. Have the discharge nurse carefully go over each prescription with you, before taking them home. Make sure that your name is accurately spelled, that your address is correct. Check the name and dose of your medication to make sure it is accurate. Check the number of pills, and the written instructions to make sure they are clear and accurate. Make sure that you are given enough medication to last  until your next medication refill appointment. 5. Taking Medication: Take medication as prescribed. Never take more pills than instructed. Never take medication more frequently than prescribed. Taking less pills or less frequently is permitted and encouraged, when it comes to controlled substances (written prescriptions).  6. Inform other Doctors: Always inform, all of your healthcare providers, of all the medications you take. 7. Pain Medication from other Providers: You are not allowed to accept any additional pain medication from any other Doctor or Healthcare provider. There are two exceptions to this rule. (see below) In the event that you require additional pain medication, you are responsible for notifying us, as stated below. 8. Medication Agreement: You are responsible for carefully reading and following our Medication Agreement. This must be signed before receiving any prescriptions from our practice. Safely store a copy of your signed Agreement. Violations to the Agreement will result in no further prescriptions. (Additional copies of our Medication Agreement are available upon request.) 9. Laws, Rules, & Regulations: All patients are expected to follow all Federal and State Laws, Statutes, Rules, & Regulations. Ignorance of the Laws does not constitute a valid excuse. The use of any illegal substances is prohibited. 10. Adopted CDC guidelines & recommendations: Target dosing levels will be at or below 60 MME/day. Use of benzodiazepines** is not recommended.  Exceptions: There are only two exceptions to the rule of not receiving pain medications from other Healthcare Providers. 1. Exception #1 (Emergencies): In the event of an emergency (i.e.: accident requiring emergency care), you are allowed to receive additional pain medication. However, you are responsible for: As soon as you are able, call our office [phone], at any time of the day or night, and leave a message stating your name, the  date and nature of the emergency, and the name and dose of the medication   prescribed. In the event that your call is answered by a member of our staff, make sure to document and save the date, time, and the name of the person that took your information.  2. Exception #2 (Planned Surgery): In the event that you are scheduled by another doctor or dentist to have any type of surgery or procedure, you are allowed (for a period no longer than 30 days), to receive additional pain medication, for the acute post-op pain. However, in this case, you are responsible for picking up a copy of our "Post-op Pain Management for Surgeons" handout, and giving it to your surgeon or dentist. This document is available at our office, and does not require an appointment to obtain it. Simply go to our office during business hours (Monday-Thursday from 8:00 AM to 4:00 PM) (Friday 8:00 AM to 12:00 Noon) or if you have a scheduled appointment with [place], prior to your surgery, and ask for it by name. In addition, you will need to provide [place] with your name, name of your surgeon, type of surgery, and date of procedure or surgery.  *Opioid medications include: morphine, codeine, oxycodone, oxymorphone, hydrocodone, hydromorphone, meperidine, tramadol, tapentadol, buprenorphine, fentanyl, methadone. **Benzodiazepine medications include: diazepam (Valium), alprazolam (Xanax), clonazepam (Klonopine), lorazepam (Ativan), clorazepate (Tranxene), chlordiazepoxide (Librium), estazolam (Prosom), oxazepam (Serax), temazepam (Restoril), triazolam (Halcion) (Last updated: 08/07/2017) ____________________________________________________________________________________________    BMI Assessment: Estimated body mass index is 29.05 kg/m as calculated from the following:   Height as of this encounter: 5\' 6"  (1.676 m).   Weight as of this encounter: 180 lb (81.6 kg).  BMI interpretation table: BMI level Category Range association with higher  incidence of chronic pain  <18 kg/m2 Underweight   18.5-24.9 kg/m2 Ideal body weight   25-29.9 kg/m2 Overweight Increased incidence by 20%  30-34.9 kg/m2 Obese (Class I) Increased incidence by 68%  35-39.9 kg/m2 Severe obesity (Class II) Increased incidence by 136%  >40 kg/m2 Extreme obesity (Class III) Increased incidence by 254%   BMI Readings from Last 4 Encounters:  09/08/17 29.05 kg/m  08/25/17 29.19 kg/m  08/14/17 29.05 kg/m  07/21/17 29.05 kg/m   Wt Readings from Last 4 Encounters:  09/08/17 180 lb (81.6 kg)  08/25/17 182 lb 14.4 oz (83 kg)  08/14/17 180 lb (81.6 kg)  07/21/17 180 lb (81.6 kg)

## 2017-09-30 ENCOUNTER — Telehealth: Payer: Self-pay

## 2017-09-30 DIAGNOSIS — N6310 Unspecified lump in the right breast, unspecified quadrant: Secondary | ICD-10-CM

## 2017-09-30 DIAGNOSIS — N644 Mastodynia: Secondary | ICD-10-CM

## 2017-09-30 NOTE — Telephone Encounter (Signed)
That's fine to order BILAT DIAG TOMO BREAST [id number] and LEFT and RIGHT [place] [id number] and [id number] for breast lump, and please document where on the orders so the tech can have the most information Thank you

## 2017-09-30 NOTE — Telephone Encounter (Signed)
If I find out where the lumps are located are you willing to order without a visit? Or does she need to be seen?

## 2017-09-30 NOTE — Telephone Encounter (Signed)
Copied from [place] [id number]. Topic: Referral - Request >> Sep 30, 2017  2:18 PM Arletha Grippe wrote: Reason for CRM: pt is having right breast lumps, she needs an order diagnostic mammogram/.  Please call [phone]

## 2017-10-01 NOTE — Telephone Encounter (Signed)
Pt notified right breast at nipple 12 o clock with soarness.

## 2017-10-06 ENCOUNTER — Encounter: Payer: Self-pay | Admitting: Nurse Practitioner

## 2017-10-07 ENCOUNTER — Other Ambulatory Visit: Payer: Self-pay

## 2017-10-07 ENCOUNTER — Encounter: Payer: Self-pay | Admitting: Nurse Practitioner

## 2017-10-07 ENCOUNTER — Ambulatory Visit: Payer: Managed Care, Other (non HMO) | Attending: Nurse Practitioner | Admitting: Nurse Practitioner

## 2017-10-07 DIAGNOSIS — M47817 Spondylosis without myelopathy or radiculopathy, lumbosacral region: Secondary | ICD-10-CM | POA: Diagnosis not present

## 2017-10-07 DIAGNOSIS — M48061 Spinal stenosis, lumbar region without neurogenic claudication: Secondary | ICD-10-CM | POA: Diagnosis not present

## 2017-10-07 DIAGNOSIS — M7918 Myalgia, other site: Secondary | ICD-10-CM | POA: Insufficient documentation

## 2017-10-07 DIAGNOSIS — M25552 Pain in left hip: Secondary | ICD-10-CM | POA: Insufficient documentation

## 2017-10-07 DIAGNOSIS — M47816 Spondylosis without myelopathy or radiculopathy, lumbar region: Secondary | ICD-10-CM | POA: Diagnosis not present

## 2017-10-07 DIAGNOSIS — F4321 Adjustment disorder with depressed mood: Secondary | ICD-10-CM | POA: Insufficient documentation

## 2017-10-07 DIAGNOSIS — Z79891 Long term (current) use of opiate analgesic: Secondary | ICD-10-CM | POA: Diagnosis not present

## 2017-10-07 DIAGNOSIS — M545 Low back pain: Secondary | ICD-10-CM | POA: Insufficient documentation

## 2017-10-07 DIAGNOSIS — M5442 Lumbago with sciatica, left side: Secondary | ICD-10-CM

## 2017-10-07 DIAGNOSIS — F17210 Nicotine dependence, cigarettes, uncomplicated: Secondary | ICD-10-CM | POA: Insufficient documentation

## 2017-10-07 DIAGNOSIS — Z79899 Other long term (current) drug therapy: Secondary | ICD-10-CM | POA: Diagnosis not present

## 2017-10-07 DIAGNOSIS — G894 Chronic pain syndrome: Secondary | ICD-10-CM | POA: Insufficient documentation

## 2017-10-07 DIAGNOSIS — E559 Vitamin D deficiency, unspecified: Secondary | ICD-10-CM | POA: Diagnosis not present

## 2017-10-07 DIAGNOSIS — G8929 Other chronic pain: Secondary | ICD-10-CM | POA: Diagnosis not present

## 2017-10-07 MED ORDER — MELOXICAM 15 MG PO TABS: 15.0000 mg | ORAL_TABLET | Freq: Every day | ORAL | 0 refills | Status: DC

## 2017-10-07 MED ORDER — OXYCODONE-ACETAMINOPHEN 10-325 MG PO TABS: 1.0000 | ORAL_TABLET | Freq: Four times a day (QID) | ORAL | 0 refills | Status: DC | PRN

## 2017-10-07 MED ORDER — GABAPENTIN 300 MG PO CAPS: 300.0000 mg | ORAL_CAPSULE | Freq: Three times a day (TID) | ORAL | 0 refills | Status: DC

## 2017-10-07 MED ORDER — CYCLOBENZAPRINE HCL 10 MG PO TABS: 10.0000 mg | ORAL_TABLET | Freq: Every day | ORAL | 0 refills | Status: DC

## 2017-10-07 MED ORDER — DULOXETINE HCL 30 MG PO CPEP: 30.0000 mg | ORAL_CAPSULE | Freq: Two times a day (BID) | ORAL | 0 refills | Status: DC

## 2017-10-07 NOTE — Progress Notes (Signed)
Patient's Name: [name]  MRN: [id number]  Referring Provider: Arnetha Courser, MD  DOB: 03-04-1971  PCP: Arnetha Courser, MD  DOS: 10/07/2017  Note by: Vevelyn Francois NP  Service setting: Ambulatory outpatient  Specialty: Interventional Pain Management  Location: [name] (AMB) Pain Management Facility    Patient type: Established    Primary Reason(s) for Visit: Encounter for prescription drug management. (Level of risk: moderate)  CC: Back Pain (lower, center)  HPI  [name] is a 47 y.o. year old, female patient, who comes today for a medication management evaluation. She has Adjustment disorder with depressed mood; Stressful life events affecting family and household; Vitamin D deficiency; Lumbosacral spondylosis without myelopathy; Lumbar central spinal stenosis (L4-5); Breast cancer screening; Bladder irritation; Chronic low back pain (Primary Source of Pain) (midline); Arthralgia of multiple joints; Chronic, continuous use of opioids; Elevated rheumatoid factor; Chronic pain syndrome; Long term (current) use of opiate analgesic; Long term prescription opiate use; Opiate use (85.5 MME/Day); Chronic hip pain (Secondary source of pain) (Left); Failed back surgical syndrome; Discogenic low back pain; Lumbar discogenic pain syndrome; Chronic sacroiliac joint pain (Left); Chronic musculoskeletal pain; Lumbar facet syndrome (Bilateral); Lumbar spondylosis; Neurogenic pain; Depression; Acute postoperative pain; Trigger point with back pain (Left); Tobacco use; and Preventative health care on their problem list. Her primarily concern today is the Back Pain (lower, center)  Pain Assessment: Location: Lower Back Radiating: denies Onset: More than a month ago Duration: Chronic pain Quality: Aching, Dull Severity: 3 /10 (self-reported pain score)  Note: Reported level is compatible with observation.                          Timing: Constant Modifying factors: heat, changing positions  [name] was last  scheduled for an appointment on 09/08/2017 for medication management. During today's appointment we reviewed [name]'s chronic pain status, as well as her outpatient medication regimen. She feels like her back pain is getting worse.She admits that she was having to brag her legs last week. She denies any numbness or tingling in her legs. She denies any falls or injuries.  She admits that this has resolved. She is SP Bilateral Lumbar facet RFA. She admits that this is effective for her pain short term. She has not had a recent MRI. She has not completed any recent PT. She admits that she tries to walk. She does sometimes try to stretch her back.   The patient  reports that she does not use drugs. Her body mass index is 29.05 kg/m.  Further details on both, my assessment(s), as well as the proposed treatment plan, please see below.  Controlled Substance Pharmacotherapy Assessment REMS (Risk Evaluation and Mitigation Strategy)  Analgesic:Oxycodone/acetaminophenIR 10/352m every 6 hours (434mdayof oxycodone) (60MME/day) MME/day:6059may.    WheLandis Crane  10/07/2017  9:22 AM  Sign at close encounter Nursing Pain Medication Assessment:  Safety precautions to be maintained throughout the outpatient stay will include: orient to surroundings, keep bed in low position, maintain call bell within reach at all times, provide assistance with transfer out of bed and ambulation.  Medication Inspection Compliance: Pill count conducted under aseptic conditions, in front of the patient. Neither the pills nor the bottle was removed from the patient's sight at any time. Once count was completed pills were immediately returned to the patient in their original bottle.  Medication: Oxycodone/APAP Pill/Patch Count: 50 of 120 pills remain Pill/Patch Appearance: Markings consistent with prescribed  medication Bottle Appearance: Standard pharmacy container. Clearly labeled. Filled Date: 04/14 / 2019 Last  Medication intake:  Today   Pharmacokinetics: Liberation and absorption (onset of action): WNL Distribution (time to peak effect): WNL Metabolism and excretion (duration of action): WNL         Pharmacodynamics: Desired effects: Analgesia: [name] reports >50% benefit. Functional ability: Patient reports that medication allows her to accomplish basic ADLs Clinically meaningful improvement in function (CMIF): Sustained CMIF goals met Perceived effectiveness: Described as relatively effective, allowing for increase in activities of daily living (ADL) Undesirable effects: Side-effects or Adverse reactions: None reported Monitoring: [place] PMP: Online review of the past 69-monthperiod conducted. Compliant with practice rules and regulations Last UDS on record: Summary  Date Value Ref Range Status  08/14/2017 FINAL  Final    Comment:    ==================================================================== TOXASSURE SELECT 13 (MW) ==================================================================== Test                             Result       Flag       Units Drug Present and Declared for Prescription Verification   Oxycodone                      >2597        EXPECTED   ng/mg creat   Oxymorphone                    905          EXPECTED   ng/mg creat   Noroxycodone                   >2597        EXPECTED   ng/mg creat   Noroxymorphone                 1255         EXPECTED   ng/mg creat    Sources of oxycodone are scheduled prescription medications.    Oxymorphone, noroxycodone, and noroxymorphone are expected    metabolites of oxycodone. Oxymorphone is also available as a    scheduled prescription medication. ==================================================================== Test                      Result    Flag   Units      Ref Range   Creatinine              385              mg/dL      >=20 ==================================================================== Declared Medications:   The flagging and interpretation on this report are based on the  following declared medications.  Unexpected results may arise from  inaccuracies in the declared medications.  **Note: The testing scope of this panel includes these medications:  Oxycodone (Percocet)  **Note: The testing scope of this panel does not include following  reported medications:  Acetaminophen (Percocet)  Cyclobenzaprine (Flexeril)  Duloxetine (Cymbalta)  Meloxicam (Mobic) ==================================================================== For clinical consultation, please call [phone] ====================================================================    UDS interpretation: Compliant          Medication Assessment Form: Reviewed. Patient indicates being compliant with therapy Treatment compliance: Compliant Risk Assessment Profile: Aberrant behavior: See prior evaluations. None observed or detected today Comorbid factors increasing risk of overdose: See prior notes. No additional risks detected today Risk of substance use disorder (SUD): Low  ORT  Scoring interpretation table:  Score <3 = Low Risk for SUD  Score between 4-7 = Moderate Risk for SUD  Score >8 = High Risk for Opioid Abuse   Risk Mitigation Strategies:  Patient Counseling: Covered Patient-Prescriber Agreement (PPA): Present and active  Notification to other healthcare providers: Done  Pharmacologic Plan: No change in therapy, at this time.             Laboratory Chemistry  Inflammation Markers (CRP: Acute Phase) (ESR: Chronic Phase) Lab Results  Component Value Date   CRP 5.3 (H) 12/05/2016   ESRSEDRATE 9 12/05/2016                         Rheumatology Markers Lab Results  Component Value Date   RF 17.9 (H) 12/05/2016   ANA NEG 10/02/2016   LABURIC 2.5 09/18/2016                        Renal Function Markers Lab Results  Component Value Date   BUN 17 12/05/2016   CREATININE 0.84 12/05/2016   GFRAA 96 12/05/2016    GFRNONAA 83 12/05/2016                              Hepatic Function Markers Lab Results  Component Value Date   AST 21 12/05/2016   ALT 20 12/05/2016   ALBUMIN 4.4 12/05/2016   ALKPHOS 59 12/05/2016   LIPASE 107 12/26/2011                        Electrolytes Lab Results  Component Value Date   NA 139 12/05/2016   K 4.6 12/05/2016   CL 103 12/05/2016   CALCIUM 9.5 12/05/2016   MG 2.1 12/05/2016   PHOS 3.0 09/18/2016                        Neuropathy Markers Lab Results  Component Value Date   VITAMINB12 495 12/05/2016                        Bone Pathology Markers Lab Results  Component Value Date   VD25OH 27.1 (L) 09/18/2016   25OHVITD1 49 12/05/2016   25OHVITD2 <1.0 12/05/2016   25OHVITD3 49 12/05/2016                         Coagulation Parameters Lab Results  Component Value Date   INR 0.9 04/16/2013   LABPROT 12.4 04/16/2013   PLT 287 10/02/2016                        Cardiovascular Markers Lab Results  Component Value Date   CKTOTAL 75 04/16/2013   CKMB 0.6 04/16/2013   TROPONINI < 0.02 04/16/2013   HGB 13.3 10/02/2016   HCT 40.8 10/02/2016                         CA Markers No results found for: CEA, CA125, LABCA2                      Note: Lab results reviewed.  Recent Diagnostic Imaging Results  DG C-Arm 1-60 Min-No Report Fluoroscopy was utilized by the requesting physician.  No radiographic  interpretation.  Complexity Note: Imaging results reviewed. Results shared with Ms. Cordner, using Layman's terms.                         Meds   Current Outpatient Medications:  .  [START ON 10/21/2017] cyclobenzaprine (FLEXERIL) 10 MG tablet, Take 1 tablet (10 mg total) by mouth at bedtime., Disp: 30 tablet, Rfl: 0 .  [START ON 10/21/2017] DULoxetine (CYMBALTA) 30 MG capsule, Take 1 capsule (30 mg total) by mouth 2 (two) times daily., Disp: 60 capsule, Rfl: 0 .  [START ON 10/21/2017] gabapentin (NEURONTIN) 300 MG capsule, Take 1 capsule (300 mg  total) by mouth 3 (three) times daily. 2 caps at bedtime. May increase to 3 tabs at night, Disp: 90 capsule, Rfl: 0 .  [START ON 10/21/2017] meloxicam (MOBIC) 15 MG tablet, Take 1 tablet (15 mg total) by mouth daily., Disp: 30 tablet, Rfl: 0 .  [START ON 10/21/2017] oxyCODONE-acetaminophen (PERCOCET) 10-325 MG tablet, Take 1 tablet by mouth every 6 (six) hours as needed for pain., Disp: 120 tablet, Rfl: 0 .  varenicline (CHANTIX CONTINUING MONTH PAK) 1 MG tablet, Take 1 tablet (1 mg total) by mouth 2 (two) times daily., Disp: 60 tablet, Rfl: 2 .  varenicline (CHANTIX STARTING MONTH PAK) 0.5 MG X 11 & 1 MG X 42 tablet, Take one 0.5 mg tablet by mouth once daily for 3 days, then increase to one 0.5 mg tablet twice daily for 4 days, then increase to one 1 mg tablet twice daily., Disp: 53 tablet, Rfl: 0  ROS  Constitutional: Denies any fever or chills Gastrointestinal: No reported hemesis, hematochezia, vomiting, or acute GI distress Musculoskeletal: Denies any acute onset joint swelling, redness, loss of ROM, or weakness Neurological: No reported episodes of acute onset apraxia, aphasia, dysarthria, agnosia, amnesia, paralysis, loss of coordination, or loss of consciousness  Allergies  Ms. Groesbeck is allergic to codeine.  PFSH  Drug: Ms. Herrmann  reports that she does not use drugs. Alcohol:  reports that she does not drink alcohol. Tobacco:  reports that she has been smoking cigarettes.  She has a 20.00 pack-year smoking history. She has never used smokeless tobacco. Medical:  has a past medical history of Allergy, Anxiety, Chronic bilateral low back pain (07/22/2016), and Depression. Surgical: Ms. Duran  has a past surgical history that includes Abdominal hysterectomy; Tonsillectomy; Cesarean section; Spine surgery; Spine surgery (09/09/2015); and Back surgery. Family: family history includes Arthritis in her father; Cancer in her maternal aunt; Depression in her sister and sister; Diabetes in her mother;  Heart disease in her father and maternal grandfather; Hyperlipidemia in her father; Hypertension in her father and mother; Lung disease in her father; Lymphoma in her father.  Constitutional Exam  General appearance: Well nourished, well developed, and well hydrated. In no apparent acute distress Vitals:   10/07/17 0919  BP: 134/90  Pulse: 72  Resp: 16  Temp: 98 F (36.7 C)  TempSrc: Oral  SpO2: 99%  Weight: 180 lb (81.6 kg)  Height: 5' 6"  (1.676 m)  Psych/Mental status: Alert, oriented x 3 (person, place, & time)       Eyes: PERLA Respiratory: No evidence of acute respiratory distress  Lumbar Spine Area Exam  Skin & Axial Inspection: Well healed scar from previous spine surgery detected Alignment: Symmetrical Functional ROM: Adequate ROM       Stability: No instability detected Muscle Tone/Strength: Functionally intact. No obvious neuro-muscular anomalies detected. Sensory (Neurological): Unimpaired Palpation: Complains of  area being tender to palpation       Provocative Tests: Lumbar Hyperextension and rotation test: Positive bilaterally for facet joint pain. Lumbar Lateral bending test: evaluation deferred today       Patrick's Maneuver: evaluation deferred today                    Gait & Posture Assessment  Ambulation: Unassisted Gait: Relatively normal for age and body habitus Posture: WNL   Lower Extremity Exam    Side: Right lower extremity  Side: Left lower extremity  Skin & Extremity Inspection: Skin color, temperature, and hair growth are WNL. No peripheral edema or cyanosis. No masses, redness, swelling, asymmetry, or associated skin lesions. No contractures.  Skin & Extremity Inspection: Skin color, temperature, and hair growth are WNL. No peripheral edema or cyanosis. No masses, redness, swelling, asymmetry, or associated skin lesions. No contractures.  Functional ROM: Unrestricted ROM          Functional ROM: Unrestricted ROM          Muscle Tone/Strength:  Functionally intact. No obvious neuro-muscular anomalies detected.  Muscle Tone/Strength: Functionally intact. No obvious neuro-muscular anomalies detected.  Sensory (Neurological): Unimpaired  Sensory (Neurological): Unimpaired  Palpation: No palpable anomalies  Palpation: No palpable anomalies   Assessment  Primary Diagnosis & Pertinent Problem List: Diagnoses of Chronic low back pain (Location of Primary Source of Pain) (midline), Lumbar facet syndrome (Bilateral), Chronic low back pain (Primary Source of Pain) (midline), Lumbar spondylosis, Chronic pain syndrome, and Musculoskeletal pain were pertinent to this visit.  Status Diagnosis  Controlled Controlled Controlled 1. Chronic low back pain (Location of Primary Source of Pain) (midline)   2. Lumbar facet syndrome (Bilateral)   3. Chronic low back pain (Primary Source of Pain) (midline)   4. Lumbar spondylosis   5. Chronic pain syndrome   6. Musculoskeletal pain     Problems updated and reviewed during this visit: No problems updated. Plan of Care  Pharmacotherapy (Medications Ordered): Meds ordered this encounter  Medications  . meloxicam (MOBIC) 15 MG tablet    Sig: Take 1 tablet (15 mg total) by mouth daily.    Dispense:  30 tablet    Refill:  0    Do not place medication on "Automatic Refill". Fill one day early if pharmacy is closed on scheduled refill date.    Order Specific Question:   Supervising Provider    Answer:   Milinda Pointer [phone]  . DISCONTD: oxyCODONE-acetaminophen (PERCOCET) 10-325 MG tablet    Sig: Take 1 tablet by mouth every 6 (six) hours as needed for pain.    Dispense:  120 tablet    Refill:  0    Do not place this medication, or any other prescription from our practice, on "Automatic Refill". Patient may have prescription filled one day early if pharmacy is closed on scheduled refill date. Do not fill until: 10/21/2017 To last until: 11/20/2017    Order Specific Question:   Supervising Provider     Answer:   Milinda Pointer [phone]  . DULoxetine (CYMBALTA) 30 MG capsule    Sig: Take 1 capsule (30 mg total) by mouth 2 (two) times daily.    Dispense:  60 capsule    Refill:  0    Do not place medication on "Automatic Refill". Fill one day early if pharmacy is closed on scheduled refill date.    Order Specific Question:   Supervising Provider    Answer:   Milinda Pointer [phone]  .  cyclobenzaprine (FLEXERIL) 10 MG tablet    Sig: Take 1 tablet (10 mg total) by mouth at bedtime.    Dispense:  30 tablet    Refill:  0    Do not place this medication, or any other prescription from our practice, on "Automatic Refill". Patient may have prescription filled one day early if pharmacy is closed on scheduled refill date.    Order Specific Question:   Supervising Provider    Answer:   Milinda Pointer [phone]  . gabapentin (NEURONTIN) 300 MG capsule    Sig: Take 1 capsule (300 mg total) by mouth 3 (three) times daily. 2 caps at bedtime. May increase to 3 tabs at night    Dispense:  90 capsule    Refill:  0    Order Specific Question:   Supervising Provider    Answer:   Milinda Pointer [phone]  . oxyCODONE-acetaminophen (PERCOCET) 10-325 MG tablet    Sig: Take 1 tablet by mouth every 6 (six) hours as needed for pain.    Dispense:  120 tablet    Refill:  0    Do not place this medication, or any other prescription from our practice, on "Automatic Refill". Patient may have prescription filled one day early if pharmacy is closed on scheduled refill date. Do not fill until: 10/21/2017 To last until: 11/20/2017    Order Specific Question:   Supervising Provider    Answer:   Milinda Pointer [phone]   New Prescriptions   No medications on file   Medications administered today: Tabetha Haraway. Crespo had no medications administered during this visit. Lab-work, procedure(s), and/or referral(s): Orders Placed This Encounter  Procedures  . Radiofrequency,Lumbar  . CT LUMBAR SPINE W  CONTRAST  . DG Myelogram Lumbar   Imaging and/or referral(s): CT LUMBAR SPINE W CONTRAST DG MYELOGRAM LUMBAR  Interventional management options: Planned, scheduled, and/or pending:  Not at this time   Considering:  Diagnostic bilateral lumbar facet block Possible bilateral lumbar facet RFA Diagnostic left sided caudal epidural steroid injection + diagnostic epidurogram Possible left-sided Racz procedure Diagnostic left L4-5 interlaminar lumbar epidural steroid injection Diagnosticleft L4-5 transforaminal epidural steroid injection Diagnostic left L4 selective nerve root block Possible left L4 nerve root ganglion RFA Possible lumbar spinal cord stimulator trial   Palliative PRN treatment(s):  Diagnostic bilateral lumbar facet block(NO STEROIDS      Provider-requested follow-up: Return in about 1 month (around 11/04/2017) for MedMgmt with Me Dionisio David), in addition, Procedure(w/Sedation), w/ Dr. Dossie Arbour, Left Lumbar RFA.  Future Appointments  Date Time Provider [place]  11/04/2017 12:45 PM Vevelyn Francois, NP [facility] None   Primary Care Physician: Arnetha Courser, MD Location: [facility] Outpatient Pain Management Facility Note by: Vevelyn Francois NP Date: 10/07/2017; Time: 11:29 AM  Pain Score Disclaimer: We use the NRS-11 scale. This is a self-reported, subjective measurement of pain severity with only modest accuracy. It is used primarily to identify changes within a particular patient. It must be understood that outpatient pain scales are significantly less accurate that those used for research, where they can be applied under ideal controlled circumstances with minimal exposure to variables. In reality, the score is likely to be a combination of pain intensity and pain affect, where pain affect describes the degree of emotional arousal or changes in action readiness caused by the sensory experience of pain. Factors such as social and work situation,  setting, emotional state, anxiety levels, expectation, and prior pain experience may influence pain perception and show  large inter-individual differences that may also be affected by time variables.  Patient instructions provided during this appointment: Patient Instructions   ____________________________________________________________________________________________  Medication Rules  Applies to: All patients receiving prescriptions (written or electronic).  Pharmacy of record: Pharmacy where electronic prescriptions will be sent. If written prescriptions are taken to a different pharmacy, please inform the nursing staff. The pharmacy listed in the electronic medical record should be the one where you would like electronic prescriptions to be sent.  Prescription refills: Only during scheduled appointments. Applies to both, written and electronic prescriptions.  NOTE: The following applies primarily to controlled substances (Opioid* Pain Medications).   Patient's responsibilities: 1. Pain Pills: Bring all pain pills to every appointment (except for procedure appointments). 2. Pill Bottles: Bring pills in original pharmacy bottle. Always bring newest bottle. Bring bottle, even if empty. 3. Medication refills: You are responsible for knowing and keeping track of what medications you need refilled. The day before your appointment, write a list of all prescriptions that need to be refilled. Bring that list to your appointment and give it to the admitting nurse. Prescriptions will be written only during appointments. If you forget a medication, it will not be "Called in", "Faxed", or "electronically sent". You will need to get another appointment to get these prescribed. 4. Prescription Accuracy: You are responsible for carefully inspecting your prescriptions before leaving our office. Have the discharge nurse carefully go over each prescription with you, before taking them home. Make sure that  your name is accurately spelled, that your address is correct. Check the name and dose of your medication to make sure it is accurate. Check the number of pills, and the written instructions to make sure they are clear and accurate. Make sure that you are given enough medication to last until your next medication refill appointment. 5. Taking Medication: Take medication as prescribed. Never take more pills than instructed. Never take medication more frequently than prescribed. Taking less pills or less frequently is permitted and encouraged, when it comes to controlled substances (written prescriptions).  6. Inform other Doctors: Always inform, all of your healthcare providers, of all the medications you take. 7. Pain Medication from other Providers: You are not allowed to accept any additional pain medication from any other Doctor or Healthcare provider. There are two exceptions to this rule. (see below) In the event that you require additional pain medication, you are responsible for notifying us, as stated below. 8. Medication Agreement: You are responsible for carefully reading and following our Medication Agreement. This must be signed before receiving any prescriptions from our practice. Safely store a copy of your signed Agreement. Violations to the Agreement will result in no further prescriptions. (Additional copies of our Medication Agreement are available upon request.) 9. Laws, Rules, & Regulations: All patients are expected to follow all Federal and [employer], Rules, [employer]. Ignorance of the Laws does not constitute a valid excuse. The use of any illegal substances is prohibited. 10. Adopted CDC guidelines & recommendations: Target dosing levels will be at or below 60 MME/day. Use of benzodiazepines** is not recommended.  Exceptions: There are only two exceptions to the rule of not receiving pain medications from other Healthcare Providers. 1. Exception #1 (Emergencies): In  the event of an emergency (i.e.: accident requiring emergency care), you are allowed to receive additional pain medication. However, you are responsible for: As soon as you are able, call our office [phone], at any time of the day or night,  and leave a message stating your name, the date and nature of the emergency, and the name and dose of the medication prescribed. In the event that your call is answered by a member of our staff, make sure to document and save the date, time, and the name of the person that took your information.  2. Exception #2 (Planned Surgery): In the event that you are scheduled by another doctor or dentist to have any type of surgery or procedure, you are allowed (for a period no longer than 30 days), to receive additional pain medication, for the acute post-op pain. However, in this case, you are responsible for picking up a copy of our "Post-op Pain Management for Surgeons" handout, and giving it to your surgeon or dentist. This document is available at our office, and does not require an appointment to obtain it. Simply go to our office during business hours (Monday-Thursday from 8:00 AM to 4:00 PM) (Friday 8:00 AM to 12:00 Noon) or if you have a scheduled appointment with [place], prior to your surgery, and ask for it by name. In addition, you will need to provide [place] with your name, name of your surgeon, type of surgery, and date of procedure or surgery.  *Opioid medications include: morphine, codeine, oxycodone, oxymorphone, hydrocodone, hydromorphone, meperidine, tramadol, tapentadol, buprenorphine, fentanyl, methadone. **Benzodiazepine medications include: diazepam (Valium), alprazolam (Xanax), clonazepam (Klonopine), lorazepam (Ativan), clorazepate (Tranxene), chlordiazepoxide (Librium), estazolam (Prosom), oxazepam (Serax), temazepam (Restoril), triazolam (Halcion) (Last updated:  08/07/2017) ____________________________________________________________________________________________   BMI Assessment: Estimated body mass index is 29.05 kg/m as calculated from the following:   Height as of this encounter: 5' 6"  (1.676 m).   Weight as of this encounter: 180 lb (81.6 kg).  BMI interpretation table: BMI level Category Range association with higher incidence of chronic pain  <18 kg/m2 Underweight   18.5-24.9 kg/m2 Ideal body weight   25-29.9 kg/m2 Overweight Increased incidence by 20%  30-34.9 kg/m2 Obese (Class I) Increased incidence by 68%  35-39.9 kg/m2 Severe obesity (Class II) Increased incidence by 136%  >40 kg/m2 Extreme obesity (Class III) Increased incidence by 254%   Patient's current BMI Ideal Body weight  Body mass index is 29.05 kg/m. Ideal body weight: 59.3 kg (130 lb 11.7 oz) Adjusted ideal body weight: 68.2 kg (150 lb 7 oz)   BMI Readings from Last 4 Encounters:  10/07/17 29.05 kg/m  09/08/17 29.05 kg/m  08/25/17 29.19 kg/m  08/14/17 29.05 kg/m   Wt Readings from Last 4 Encounters:  10/07/17 180 lb (81.6 kg)  09/08/17 180 lb (81.6 kg)  08/25/17 182 lb 14.4 oz (83 kg)  08/14/17 180 lb (81.6 kg)

## 2017-10-07 NOTE — Progress Notes (Signed)
Nursing Pain Medication Assessment:  Safety precautions to be maintained throughout the outpatient stay will include: orient to surroundings, keep bed in low position, maintain call bell within reach at all times, provide assistance with transfer out of bed and ambulation.  Medication Inspection Compliance: Pill count conducted under aseptic conditions, in front of the patient. Neither the pills nor the bottle was removed from the patient's sight at any time. Once count was completed pills were immediately returned to the patient in their original bottle.  Medication: Oxycodone/APAP Pill/Patch Count: 50 of 120 pills remain Pill/Patch Appearance: Markings consistent with prescribed medication Bottle Appearance: Standard pharmacy container. Clearly labeled. Filled Date: 04/14 / 2019 Last Medication intake:  Today

## 2017-10-07 NOTE — Patient Instructions (Signed)
____________________________________________________________________________________________  Medication Rules  Applies to: All patients receiving prescriptions (written or electronic).  Pharmacy of record: Pharmacy where electronic prescriptions will be sent. If written prescriptions are taken to a different pharmacy, please inform the nursing staff. The pharmacy listed in the electronic medical record should be the one where you would like electronic prescriptions to be sent.  Prescription refills: Only during scheduled appointments. Applies to both, written and electronic prescriptions.  NOTE: The following applies primarily to controlled substances (Opioid* Pain Medications).   Patient's responsibilities: 1. Pain Pills: Bring all pain pills to every appointment (except for procedure appointments). 2. Pill Bottles: Bring pills in original pharmacy bottle. Always bring newest bottle. Bring bottle, even if empty. 3. Medication refills: You are responsible for knowing and keeping track of what medications you need refilled. The day before your appointment, write a list of all prescriptions that need to be refilled. Bring that list to your appointment and give it to the admitting nurse. Prescriptions will be written only during appointments. If you forget a medication, it will not be "Called in", "Faxed", or "electronically sent". You will need to get another appointment to get these prescribed. 4. Prescription Accuracy: You are responsible for carefully inspecting your prescriptions before leaving our office. Have the discharge nurse carefully go over each prescription with you, before taking them home. Make sure that your name is accurately spelled, that your address is correct. Check the name and dose of your medication to make sure it is accurate. Check the number of pills, and the written instructions to make sure they are clear and accurate. Make sure that you are given enough medication to last  until your next medication refill appointment. 5. Taking Medication: Take medication as prescribed. Never take more pills than instructed. Never take medication more frequently than prescribed. Taking less pills or less frequently is permitted and encouraged, when it comes to controlled substances (written prescriptions).  6. Inform other Doctors: Always inform, all of your healthcare providers, of all the medications you take. 7. Pain Medication from other Providers: You are not allowed to accept any additional pain medication from any other Doctor or Healthcare provider. There are two exceptions to this rule. (see below) In the event that you require additional pain medication, you are responsible for notifying us, as stated below. 8. Medication Agreement: You are responsible for carefully reading and following our Medication Agreement. This must be signed before receiving any prescriptions from our practice. Safely store a copy of your signed Agreement. Violations to the Agreement will result in no further prescriptions. (Additional copies of our Medication Agreement are available upon request.) 9. Laws, Rules, & Regulations: All patients are expected to follow all Federal and State Laws, Statutes, Rules, & Regulations. Ignorance of the Laws does not constitute a valid excuse. The use of any illegal substances is prohibited. 10. Adopted CDC guidelines & recommendations: Target dosing levels will be at or below 60 MME/day. Use of benzodiazepines** is not recommended.  Exceptions: There are only two exceptions to the rule of not receiving pain medications from other Healthcare Providers. 1. Exception #1 (Emergencies): In the event of an emergency (i.e.: accident requiring emergency care), you are allowed to receive additional pain medication. However, you are responsible for: As soon as you are able, call our office [phone], at any time of the day or night, and leave a message stating your name, the  date and nature of the emergency, and the name and dose of the medication   prescribed. In the event that your call is answered by a member of our staff, make sure to document and save the date, time, and the name of the person that took your information.  2. Exception #2 (Planned Surgery): In the event that you are scheduled by another doctor or dentist to have any type of surgery or procedure, you are allowed (for a period no longer than 30 days), to receive additional pain medication, for the acute post-op pain. However, in this case, you are responsible for picking up a copy of our "Post-op Pain Management for Surgeons" handout, and giving it to your surgeon or dentist. This document is available at our office, and does not require an appointment to obtain it. Simply go to our office during business hours (Monday-Thursday from 8:00 AM to 4:00 PM) (Friday 8:00 AM to 12:00 Noon) or if you have a scheduled appointment with [place], prior to your surgery, and ask for it by name. In addition, you will need to provide [place] with your name, name of your surgeon, type of surgery, and date of procedure or surgery.  *Opioid medications include: morphine, codeine, oxycodone, oxymorphone, hydrocodone, hydromorphone, meperidine, tramadol, tapentadol, buprenorphine, fentanyl, methadone. **Benzodiazepine medications include: diazepam (Valium), alprazolam (Xanax), clonazepam (Klonopine), lorazepam (Ativan), clorazepate (Tranxene), chlordiazepoxide (Librium), estazolam (Prosom), oxazepam (Serax), temazepam (Restoril), triazolam (Halcion) (Last updated: 08/07/2017) ____________________________________________________________________________________________   BMI Assessment: Estimated body mass index is 29.05 kg/m as calculated from the following:   Height as of this encounter: 5\' 6"  (1.676 m).   Weight as of this encounter: 180 lb (81.6 kg).  BMI interpretation table: BMI level Category Range association with higher  incidence of chronic pain  <18 kg/m2 Underweight   18.5-24.9 kg/m2 Ideal body weight   25-29.9 kg/m2 Overweight Increased incidence by 20%  30-34.9 kg/m2 Obese (Class I) Increased incidence by 68%  35-39.9 kg/m2 Severe obesity (Class II) Increased incidence by 136%  >40 kg/m2 Extreme obesity (Class III) Increased incidence by 254%   Patient's current BMI Ideal Body weight  Body mass index is 29.05 kg/m. Ideal body weight: 59.3 kg (130 lb 11.7 oz) Adjusted ideal body weight: 68.2 kg (150 lb 7 oz)   BMI Readings from Last 4 Encounters:  10/07/17 29.05 kg/m  09/08/17 29.05 kg/m  08/25/17 29.19 kg/m  08/14/17 29.05 kg/m   Wt Readings from Last 4 Encounters:  10/07/17 180 lb (81.6 kg)  09/08/17 180 lb (81.6 kg)  08/25/17 182 lb 14.4 oz (83 kg)  08/14/17 180 lb (81.6 kg)

## 2017-11-04 ENCOUNTER — Encounter: Payer: Self-pay | Admitting: Nurse Practitioner

## 2017-11-10 ENCOUNTER — Telehealth: Payer: Self-pay | Admitting: Family Medicine

## 2017-11-10 ENCOUNTER — Ambulatory Visit: Payer: BLUE CROSS/BLUE SHIELD | Attending: Nurse Practitioner | Admitting: Nurse Practitioner

## 2017-11-10 ENCOUNTER — Other Ambulatory Visit: Payer: Self-pay

## 2017-11-10 ENCOUNTER — Encounter: Payer: Self-pay | Admitting: Nurse Practitioner

## 2017-11-10 VITALS — BP 130/84 | HR 86 | Temp 98.0°F | Resp 18 | Ht 66.0 in | Wt 180.0 lb

## 2017-11-10 DIAGNOSIS — E559 Vitamin D deficiency, unspecified: Secondary | ICD-10-CM | POA: Insufficient documentation

## 2017-11-10 DIAGNOSIS — Z885 Allergy status to narcotic agent status: Secondary | ICD-10-CM | POA: Insufficient documentation

## 2017-11-10 DIAGNOSIS — Z90710 Acquired absence of both cervix and uterus: Secondary | ICD-10-CM | POA: Insufficient documentation

## 2017-11-10 DIAGNOSIS — F17210 Nicotine dependence, cigarettes, uncomplicated: Secondary | ICD-10-CM | POA: Insufficient documentation

## 2017-11-10 DIAGNOSIS — F4321 Adjustment disorder with depressed mood: Secondary | ICD-10-CM | POA: Insufficient documentation

## 2017-11-10 DIAGNOSIS — M549 Dorsalgia, unspecified: Secondary | ICD-10-CM | POA: Insufficient documentation

## 2017-11-10 DIAGNOSIS — M48061 Spinal stenosis, lumbar region without neurogenic claudication: Secondary | ICD-10-CM | POA: Diagnosis not present

## 2017-11-10 DIAGNOSIS — G8918 Other acute postprocedural pain: Secondary | ICD-10-CM | POA: Diagnosis not present

## 2017-11-10 DIAGNOSIS — M47816 Spondylosis without myelopathy or radiculopathy, lumbar region: Secondary | ICD-10-CM | POA: Diagnosis not present

## 2017-11-10 DIAGNOSIS — Z79891 Long term (current) use of opiate analgesic: Secondary | ICD-10-CM | POA: Insufficient documentation

## 2017-11-10 DIAGNOSIS — M5442 Lumbago with sciatica, left side: Secondary | ICD-10-CM

## 2017-11-10 DIAGNOSIS — G894 Chronic pain syndrome: Secondary | ICD-10-CM | POA: Insufficient documentation

## 2017-11-10 DIAGNOSIS — Z5181 Encounter for therapeutic drug level monitoring: Secondary | ICD-10-CM | POA: Insufficient documentation

## 2017-11-10 DIAGNOSIS — M7918 Myalgia, other site: Secondary | ICD-10-CM | POA: Insufficient documentation

## 2017-11-10 DIAGNOSIS — G8929 Other chronic pain: Secondary | ICD-10-CM

## 2017-11-10 DIAGNOSIS — Z9889 Other specified postprocedural states: Secondary | ICD-10-CM | POA: Insufficient documentation

## 2017-11-10 DIAGNOSIS — M47817 Spondylosis without myelopathy or radiculopathy, lumbosacral region: Secondary | ICD-10-CM | POA: Insufficient documentation

## 2017-11-10 MED ORDER — CYCLOBENZAPRINE HCL 10 MG PO TABS: 10.0000 mg | ORAL_TABLET | Freq: Every day | ORAL | 0 refills | Status: DC

## 2017-11-10 MED ORDER — DULOXETINE HCL 30 MG PO CPEP: 30.0000 mg | ORAL_CAPSULE | Freq: Two times a day (BID) | ORAL | 0 refills | Status: DC

## 2017-11-10 MED ORDER — OXYCODONE-ACETAMINOPHEN 10-325 MG PO TABS: 1.0000 | ORAL_TABLET | Freq: Four times a day (QID) | ORAL | 0 refills | Status: DC | PRN

## 2017-11-10 MED ORDER — MELOXICAM 15 MG PO TABS: 15.0000 mg | ORAL_TABLET | Freq: Every day | ORAL | 0 refills | Status: DC

## 2017-11-10 NOTE — Telephone Encounter (Signed)
Pt notified it was part of her physical labs and to get them done

## 2017-11-10 NOTE — Patient Instructions (Addendum)
Rx for flexeril, cymbalta and mobic have been escribed to your pharmacy.  You have been given Rx for percocet to last until 12/20/2017. ____________________________________________________________________________________________  Medication Rules  Applies to: All patients receiving prescriptions (written or electronic).  Pharmacy of record: Pharmacy where electronic prescriptions will be sent. If written prescriptions are taken to a different pharmacy, please inform the nursing staff. The pharmacy listed in the electronic medical record should be the one where you would like electronic prescriptions to be sent.  Prescription refills: Only during scheduled appointments. Applies to both, written and electronic prescriptions.  NOTE: The following applies primarily to controlled substances (Opioid* Pain Medications).   Patient's responsibilities: 1. Pain Pills: Bring all pain pills to every appointment (except for procedure appointments). 2. Pill Bottles: Bring pills in original pharmacy bottle. Always bring newest bottle. Bring bottle, even if empty. 3. Medication refills: You are responsible for knowing and keeping track of what medications you need refilled. The day before your appointment, write a list of all prescriptions that need to be refilled. Bring that list to your appointment and give it to the admitting nurse. Prescriptions will be written only during appointments. If you forget a medication, it will not be "Called in", "Faxed", or "electronically sent". You will need to get another appointment to get these prescribed. 4. Prescription Accuracy: You are responsible for carefully inspecting your prescriptions before leaving our office. Have the discharge nurse carefully go over each prescription with you, before taking them home. Make sure that your name is accurately spelled, that your address is correct. Check the name and dose of your medication to make sure it is accurate. Check the  number of pills, and the written instructions to make sure they are clear and accurate. Make sure that you are given enough medication to last until your next medication refill appointment. 5. Taking Medication: Take medication as prescribed. Never take more pills than instructed. Never take medication more frequently than prescribed. Taking less pills or less frequently is permitted and encouraged, when it comes to controlled substances (written prescriptions).  6. Inform other Doctors: Always inform, all of your healthcare providers, of all the medications you take. 7. Pain Medication from other Providers: You are not allowed to accept any additional pain medication from any other Doctor or Healthcare provider. There are two exceptions to this rule. (see below) In the event that you require additional pain medication, you are responsible for notifying us, as stated below. 8. Medication Agreement: You are responsible for carefully reading and following our Medication Agreement. This must be signed before receiving any prescriptions from our practice. Safely store a copy of your signed Agreement. Violations to the Agreement will result in no further prescriptions. (Additional copies of our Medication Agreement are available upon request.) 9. Laws, Rules, & Regulations: All patients are expected to follow all Federal and [employer], Rules, [employer]. Ignorance of the Laws does not constitute a valid excuse. The use of any illegal substances is prohibited. 10. Adopted CDC guidelines & recommendations: Target dosing levels will be at or below 60 MME/day. Use of benzodiazepines** is not recommended.  Exceptions: There are only two exceptions to the rule of not receiving pain medications from other Healthcare Providers. 1. Exception #1 (Emergencies): In the event of an emergency (i.e.: accident requiring emergency care), you are allowed to receive additional pain medication. However, you are  responsible for: As soon as you are able, call our office [phone], at any time of the day  or night, and leave a message stating your name, the date and nature of the emergency, and the name and dose of the medication prescribed. In the event that your call is answered by a member of our staff, make sure to document and save the date, time, and the name of the person that took your information.  2. Exception #2 (Planned Surgery): In the event that you are scheduled by another doctor or dentist to have any type of surgery or procedure, you are allowed (for a period no longer than 30 days), to receive additional pain medication, for the acute post-op pain. However, in this case, you are responsible for picking up a copy of our "Post-op Pain Management for Surgeons" handout, and giving it to your surgeon or dentist. This document is available at our office, and does not require an appointment to obtain it. Simply go to our office during business hours (Monday-Thursday from 8:00 AM to 4:00 PM) (Friday 8:00 AM to 12:00 Noon) or if you have a scheduled appointment with [place], prior to your surgery, and ask for it by name. In addition, you will need to provide [place] with your name, name of your surgeon, type of surgery, and date of procedure or surgery.  *Opioid medications include: morphine, codeine, oxycodone, oxymorphone, hydrocodone, hydromorphone, meperidine, tramadol, tapentadol, buprenorphine, fentanyl, methadone. **Benzodiazepine medications include: diazepam (Valium), alprazolam (Xanax), clonazepam (Klonopine), lorazepam (Ativan), clorazepate (Tranxene), chlordiazepoxide (Librium), estazolam (Prosom), oxazepam (Serax), temazepam (Restoril), triazolam (Halcion) (Last updated: 08/07/2017) ____________________________________________________________________________________________   BMI Assessment: Estimated body mass index is 29.05 kg/m as calculated from the following:   Height as of this encounter: 5'  6" (1.676 m).   Weight as of this encounter: 180 lb (81.6 kg).  BMI interpretation table: BMI level Category Range association with higher incidence of chronic pain  <18 kg/m2 Underweight   18.5-24.9 kg/m2 Ideal body weight   25-29.9 kg/m2 Overweight Increased incidence by 20%  30-34.9 kg/m2 Obese (Class I) Increased incidence by 68%  35-39.9 kg/m2 Severe obesity (Class II) Increased incidence by 136%  >40 kg/m2 Extreme obesity (Class III) Increased incidence by 254%   Patient's current BMI Ideal Body weight  Body mass index is 29.05 kg/m. Ideal body weight: 59.3 kg (130 lb 11.7 oz) Adjusted ideal body weight: 68.2 kg (150 lb 7 oz)   BMI Readings from Last 4 Encounters:  11/10/17 29.05 kg/m  10/07/17 29.05 kg/m  09/08/17 29.05 kg/m  08/25/17 29.19 kg/m   Wt Readings from Last 4 Encounters:  11/10/17 180 lb (81.6 kg)  10/07/17 180 lb (81.6 kg)  09/08/17 180 lb (81.6 kg)  08/25/17 182 lb 14.4 oz (83 kg)

## 2017-11-10 NOTE — Telephone Encounter (Signed)
Copied from [place] [id number]. Topic: Quick Communication - See Telephone Encounter >> Nov 10, 2017  2:13 PM Cleaster Corin, NT wrote: CRM for notification. See Telephone encounter for: 11/10/17.  Pt. Calling to request labs to check kidney function and states that she wasn't able to have lab work done the day she had her physical due to the office being behind. Pt. Can be reached at  [phone]

## 2017-11-10 NOTE — Progress Notes (Signed)
Patient's Name: [name]  MRN: [id number]  Referring Provider: Arnetha Courser, MD  DOB: [birth year]  PCP: Arnetha Courser, MD  DOS: 11/10/2017  Note by: Vevelyn Francois NP  Service setting: Ambulatory outpatient  Specialty: Interventional Pain Management  Location: [name] (AMB) Pain Management Facility    Patient type: Established    Primary Reason(s) for Visit: Encounter for prescription drug management. (Level of risk: moderate)  CC: Back Pain (mid lower)  HPI  [name] is a 47 y.o. year old, female patient, who comes today for a medication management evaluation. She has Adjustment disorder with depressed mood; Stressful life events affecting family and household; Vitamin D deficiency; Lumbosacral spondylosis without myelopathy; Lumbar central spinal stenosis (L4-5); Breast cancer screening; Bladder irritation; Chronic low back pain (Primary Source of Pain) (midline); Arthralgia of multiple joints; Chronic, continuous use of opioids; Elevated rheumatoid factor; Chronic pain syndrome; Long term (current) use of opiate analgesic; Long term prescription opiate use; Opiate use (85.5 MME/Day); Chronic hip pain (Secondary source of pain) (Left); Failed back surgical syndrome; Discogenic low back pain; Lumbar discogenic pain syndrome; Chronic sacroiliac joint pain (Left); Chronic musculoskeletal pain; Lumbar facet syndrome (Bilateral); Lumbar spondylosis; Neurogenic pain; Depression; Acute postoperative pain; Trigger point with back pain (Left); Tobacco use; and Preventative health care on their problem list. Her primarily concern today is the Back Pain (mid lower)  Pain Assessment: Location: Mid, Lower Back Radiating: didnt radiat until fall last week, will radiate from back of knee to ankle Onset: More than a month ago Duration: Chronic pain Quality: Aching, Dull, Discomfort Severity: 3 /10 (subjective, self-reported pain score)  Note: Reported level is compatible with observation.                           Effect on ADL: ADls Timing: Constant Modifying factors: changing positions BP: 130/84  HR: 86  [name] was last scheduled for an appointment on 10/07/2017 for medication management. During today's appointment we reviewed [name]'s chronic pain status, as well as her outpatient medication regimen. She admits that she had a fall running after her dog on last week. She states that her main concern is her kidneys are hurting and aching. She feels like this is worse than the back pain. She feels like both kidneys are hurting. She admits that she does drink mostly water. She states that she can no longer take the gabapentin because of nausea and vomiting. She admits that her pain is constant and she is trying to keep the medication is her system. She admits that once it gets out of control it is hard to get back. She admits that she had to change insurance.  She is wanting a patch to take for her pain management. She admits that she has to take more medicine when she is going to do things.   The patient  reports that she does not use drugs. Her body mass index is 29.05 kg/m.  Further details on both, my assessment(s), as well as the proposed treatment plan, please see below.  Controlled Substance Pharmacotherapy Assessment REMS (Risk Evaluation and Mitigation Strategy)  Analgesic:Oxycodone/acetaminophenIR 10/315m every 6 hours (420mdayof oxycodone) (60MME/day) MME/day:6019may.    GarIgnatius SpeckingN  11/10/2017  1:01 PM  Sign at close encounter Nursing Pain Medication Assessment:  Safety precautions to be maintained throughout the outpatient stay will include: orient to surroundings, keep bed in low position, maintain call bell within reach at all  times, provide assistance with transfer out of bed and ambulation.  Medication Inspection Compliance: Pill count conducted under aseptic conditions, in front of the patient. Neither the pills nor the bottle was removed from the patient's  sight at any time. Once count was completed pills were immediately returned to the patient in their original bottle.  Medication: See above Pill/Patch Count: 28 of 120 pills remain Pill/Patch Appearance: Markings consistent with prescribed medication Bottle Appearance: Standard pharmacy container. Clearly labeled. Filled Date: 4 / 76 / 2019 Last Medication intake:  Today   Pharmacokinetics: Liberation and absorption (onset of action): WNL Distribution (time to peak effect): WNL Metabolism and excretion (duration of action): WNL         Pharmacodynamics: Desired effects: Analgesia: [name] reports >50% benefit. Functional ability: Patient reports that medication allows her to accomplish basic ADLs Clinically meaningful improvement in function (CMIF): Sustained CMIF goals met Perceived effectiveness: Described as relatively effective, allowing for increase in activities of daily living (ADL) Undesirable effects: Side-effects or Adverse reactions: None reported Monitoring: [place] PMP: Online review of the past 84-monthperiod conducted. Compliant with practice rules and regulations Last UDS on record: Summary  Date Value Ref Range Status  08/14/2017 FINAL  Final    Comment:    ==================================================================== TOXASSURE SELECT 13 (MW) ==================================================================== Test                             Result       Flag       Units Drug Present and Declared for Prescription Verification   Oxycodone                      >2597        EXPECTED   ng/mg creat   Oxymorphone                    905          EXPECTED   ng/mg creat   Noroxycodone                   >2597        EXPECTED   ng/mg creat   Noroxymorphone                 1255         EXPECTED   ng/mg creat    Sources of oxycodone are scheduled prescription medications.    Oxymorphone, noroxycodone, and noroxymorphone are expected    metabolites of oxycodone.  Oxymorphone is also available as a    scheduled prescription medication. ==================================================================== Test                      Result    Flag   Units      Ref Range   Creatinine              385              mg/dL      >=20 ==================================================================== Declared Medications:  The flagging and interpretation on this report are based on the  following declared medications.  Unexpected results may arise from  inaccuracies in the declared medications.  **Note: The testing scope of this panel includes these medications:  Oxycodone (Percocet)  **Note: The testing scope of this panel does not include following  reported medications:  Acetaminophen (Percocet)  Cyclobenzaprine (Flexeril)  Duloxetine (Cymbalta)  Meloxicam (  Mobic) ==================================================================== For clinical consultation, please call [phone]. ====================================================================    UDS interpretation: Compliant          Medication Assessment Form: Discrepancies found between patient's report and information collected Treatment compliance: Non-compliant Risk Assessment Profile: Aberrant behavior: frequent emergency department visits for pain, frequent involvement in accidents, non-adherence to medication regimen, taking more medication than prescribed, unsafe use of medication and unsanctioned use Comorbid factors increasing risk of overdose: See prior notes. No additional risks detected today Risk of substance use disorder (SUD): Low Opioid Risk Tool - 11/10/17 1259      Family History of Substance Abuse   Alcohol  Negative    Illegal Drugs  Negative    Rx Drugs  Negative      Personal History of Substance Abuse   Alcohol  Negative    Illegal Drugs  Negative    Rx Drugs  Negative      Psychological Disease   Psychological Disease  Negative    Depression  Negative       Total Score   Opioid Risk Tool Scoring  0    Opioid Risk Interpretation  Low Risk      ORT Scoring interpretation table:  Score <3 = Low Risk for SUD  Score between 4-7 = Moderate Risk for SUD  Score >8 = High Risk for Opioid Abuse   Risk Mitigation Strategies:  Patient Counseling: Covered Patient-Prescriber Agreement (PPA): Present and active  Notification to other healthcare providers: Done  Pharmacologic Plan: No change in therapy, at this time.            Final Warning  Laboratory Chemistry  Inflammation Markers (CRP: Acute Phase) (ESR: Chronic Phase) Lab Results  Component Value Date   CRP 5.3 (H) 12/05/2016   ESRSEDRATE 9 12/05/2016                         Rheumatology Markers Lab Results  Component Value Date   RF 17.9 (H) 12/05/2016   ANA NEG 10/02/2016   LABURIC 2.5 09/18/2016                        Renal Function Markers Lab Results  Component Value Date   BUN 17 12/05/2016   CREATININE 0.84 12/05/2016   BCR 20 12/05/2016   GFRAA 96 12/05/2016   GFRNONAA 83 12/05/2016                              Hepatic Function Markers Lab Results  Component Value Date   AST 21 12/05/2016   ALT 20 12/05/2016   ALBUMIN 4.4 12/05/2016   ALKPHOS 59 12/05/2016   LIPASE 107 12/26/2011                        Electrolytes Lab Results  Component Value Date   NA 139 12/05/2016   K 4.6 12/05/2016   CL 103 12/05/2016   CALCIUM 9.5 12/05/2016   MG 2.1 12/05/2016   PHOS 3.0 09/18/2016                        Neuropathy Markers Lab Results  Component Value Date   VITAMINB12 495 12/05/2016                        Bone Pathology Markers Lab Results  Component Value Date   VD25OH 27.1 (L) 09/18/2016   25OHVITD1 49 12/05/2016   25OHVITD2 <1.0 12/05/2016   25OHVITD3 49 12/05/2016                         Coagulation Parameters Lab Results  Component Value Date   INR 0.9 04/16/2013   LABPROT 12.4 04/16/2013   PLT 287 10/02/2016                         Cardiovascular Markers Lab Results  Component Value Date   CKTOTAL 75 04/16/2013   CKMB 0.6 04/16/2013   TROPONINI < 0.02 04/16/2013   HGB 13.3 10/02/2016   HCT 40.8 10/02/2016                         CA Markers No results found for: CEA, CA125, LABCA2                      Note: Lab results reviewed.  Recent Diagnostic Imaging Results  DG C-Arm 1-60 Min-No Report Fluoroscopy was utilized by the requesting physician.  No radiographic  interpretation.   Complexity Note: Imaging results reviewed. Results shared with Ms. Keng, using Layman's terms.                         Meds   Current Outpatient Medications:  .  [START ON 11/21/2017] cyclobenzaprine (FLEXERIL) 10 MG tablet, Take 1 tablet (10 mg total) by mouth at bedtime., Disp: 30 tablet, Rfl: 0 .  [START ON 11/20/2017] DULoxetine (CYMBALTA) 30 MG capsule, Take 1 capsule (30 mg total) by mouth 2 (two) times daily., Disp: 60 capsule, Rfl: 0 .  [START ON 11/20/2017] meloxicam (MOBIC) 15 MG tablet, Take 1 tablet (15 mg total) by mouth daily., Disp: 30 tablet, Rfl: 0 .  [START ON 11/20/2017] oxyCODONE-acetaminophen (PERCOCET) 10-325 MG tablet, Take 1 tablet by mouth every 6 (six) hours as needed for pain., Disp: 120 tablet, Rfl: 0  ROS  Constitutional: Denies any fever or chills Gastrointestinal: No reported hemesis, hematochezia, vomiting, or acute GI distress Musculoskeletal: Denies any acute onset joint swelling, redness, loss of ROM, or weakness Neurological: No reported episodes of acute onset apraxia, aphasia, dysarthria, agnosia, amnesia, paralysis, loss of coordination, or loss of consciousness  Allergies  Ms. Grandstaff is allergic to codeine.  PFSH  Drug: Ms. Rounds  reports that she does not use drugs. Alcohol:  reports that she does not drink alcohol. Tobacco:  reports that she has been smoking cigarettes.  She has a 20.00 pack-year smoking history. She has never used smokeless tobacco. Medical:  has a past medical history of  Allergy, Anxiety, Chronic bilateral low back pain (07/22/2016), and Depression. Surgical: Ms. Marzan  has a past surgical history that includes Abdominal hysterectomy; Tonsillectomy; Cesarean section; Spine surgery; Spine surgery (09/09/2015); and Back surgery. Family: family history includes Arthritis in her father; Cancer in her maternal aunt; Depression in her sister and sister; Diabetes in her mother; Heart disease in her father and maternal grandfather; Hyperlipidemia in her father; Hypertension in her father and mother; Lung disease in her father; Lymphoma in her father.  Constitutional Exam  General appearance: Well nourished, well developed, and well hydrated. In no apparent acute distress Vitals:   11/10/17 1250  BP: 130/84  Pulse: 86  Resp: 18  Temp: 98 F (36.7 C)  SpO2:  100%  Weight: 180 lb (81.6 kg)  Height: 5' 6"  (1.676 m)  Psych/Mental status: Alert, oriented x 3 (person, place, & time)       Eyes: PERLA Respiratory: No evidence of acute respiratory distress  Lumbar Spine Area Exam  Skin & Axial Inspection: Well healed scar from previous spine surgery detected Alignment: Symmetrical Functional ROM: Unrestricted ROM       Stability: No instability detected Muscle Tone/Strength: Functionally intact. No obvious neuro-muscular anomalies detected. Sensory (Neurological): Unimpaired Palpation: Tender       Provocative Tests: Lumbar Hyperextension/rotation test: deferred today       Lumbar quadrant test (Kemp's test): deferred today       Lumbar Lateral bending test: deferred today       Patrick's Maneuver: deferred today                   FABER test: deferred today       Thigh-thrust test: deferred today       S-I compression test: deferred today       S-I distraction test: deferred today        Gait & Posture Assessment  Ambulation: Unassisted Gait: Relatively normal for age and body habitus Posture: WNL   Lower Extremity Exam    Side: Right lower extremity  Side:  Left lower extremity  Stability: No instability observed          Stability: No instability observed          Skin & Extremity Inspection: Skin color, temperature, and hair growth are WNL. No peripheral edema or cyanosis. No masses, redness, swelling, asymmetry, or associated skin lesions. No contractures.  Skin & Extremity Inspection: Skin color, temperature, and hair growth are WNL. No peripheral edema or cyanosis. No masses, redness, swelling, asymmetry, or associated skin lesions. No contractures.  Functional ROM: Unrestricted ROM                  Functional ROM: Unrestricted ROM                  Muscle Tone/Strength: Functionally intact. No obvious neuro-muscular anomalies detected.  Muscle Tone/Strength: Functionally intact. No obvious neuro-muscular anomalies detected.  Sensory (Neurological): Unimpaired  Sensory (Neurological): Unimpaired  Palpation: No palpable anomalies  Palpation: No palpable anomalies   Assessment  Primary Diagnosis & Pertinent Problem List: The primary encounter diagnosis was Lumbar spondylosis. Diagnoses of Lumbar facet syndrome (Bilateral), Chronic pain syndrome, Chronic low back pain (Primary Source of Pain) (midline), Chronic low back pain (Location of Primary Source of Pain) (midline), and Musculoskeletal pain were also pertinent to this visit.  Status Diagnosis  Persistent Persistent Controlled 1. Lumbar spondylosis   2. Lumbar facet syndrome (Bilateral)   3. Chronic pain syndrome   4. Chronic low back pain (Primary Source of Pain) (midline)   5. Chronic low back pain (Location of Primary Source of Pain) (midline)   6. Musculoskeletal pain     Problems updated and reviewed during this visit: No problems updated. Plan of Care  Pharmacotherapy (Medications Ordered): Meds ordered this encounter  Medications  . DULoxetine (CYMBALTA) 30 MG capsule    Sig: Take 1 capsule (30 mg total) by mouth 2 (two) times daily.    Dispense:  60 capsule    Refill:  0     Do not place medication on "Automatic Refill". Fill one day early if pharmacy is closed on scheduled refill date.    Order Specific Question:   Supervising Provider  AnswerMilinda Pointer [[id number]]  . meloxicam (MOBIC) 15 MG tablet    Sig: Take 1 tablet (15 mg total) by mouth daily.    Dispense:  30 tablet    Refill:  0    Do not place medication on "Automatic Refill". Fill one day early if pharmacy is closed on scheduled refill date.    Order Specific Question:   Supervising Provider    Answer:   Milinda Pointer [phone]  . oxyCODONE-acetaminophen (PERCOCET) 10-325 MG tablet    Sig: Take 1 tablet by mouth every 6 (six) hours as needed for pain.    Dispense:  120 tablet    Refill:  0    Do not place this medication, or any other prescription from our practice, on "Automatic Refill". Patient may have prescription filled one day early if pharmacy is closed on scheduled refill date. Do not fill until: 11/20/2017 To last until: 12/20/2017    Order Specific Question:   Supervising Provider    Answer:   Milinda Pointer [phone]  . cyclobenzaprine (FLEXERIL) 10 MG tablet    Sig: Take 1 tablet (10 mg total) by mouth at bedtime.    Dispense:  30 tablet    Refill:  0    Do not place this medication, or any other prescription from our practice, on "Automatic Refill". Patient may have prescription filled one day early if pharmacy is closed on scheduled refill date.    Order Specific Question:   Supervising Provider    Answer:   Milinda Pointer [[id number]]   New Prescriptions   No medications on file   Medications administered today: Suheily Birks. Cosper had no medications administered during this visit. Lab-work, procedure(s), and/or referral(s): No orders of the defined types were placed in this encounter.  Imaging and/or referral(s): None  Interventional management options: Planned, scheduled, and/or pending:  Not at this time. Patient may consider different office to have her  treatment changed to fentanyl patch.    Considering:  Diagnostic bilateral lumbar facet block Possible bilateral lumbar facet RFA Diagnostic left sided caudal epidural steroid injection + diagnostic epidurogram Possible left-sided Racz procedure Diagnostic left L4-5 interlaminar lumbar epidural steroid injection Diagnosticleft L4-5 transforaminal epidural steroid injection Diagnostic left L4 selective nerve root block Possible left L4 nerve root ganglion RFA Possible lumbar spinal cord stimulator trial   Palliative PRN treatment(s):  Diagnostic bilateral lumbar facet block(NO STEROIDS   Provider-requested follow-up: No follow-ups on file.  Future Appointments  Date Time Provider [place]  11/17/2017  1:40 PM [name] [place]  11/17/2017  2:00 PM [name] [place] [name]  11/17/2017  2:10 PM [name] [place] [name]  12/10/2017 12:45 PM Vevelyn Francois, NP [name] None   Primary Care Physician: Arnetha Courser, MD Location: [facility] Note by: Vevelyn Francois NP Date: 11/10/2017; Time: 3:05 PM  Pain Score Disclaimer: We use the NRS-11 scale. This is a self-reported, subjective measurement of pain severity with only modest accuracy. It is used primarily to identify changes within a particular patient. It must be understood that outpatient pain scales are significantly less accurate that those used for research, where they can be applied under ideal controlled circumstances with minimal exposure to variables. In reality, the score is likely to be a combination of pain intensity and pain affect, where pain affect describes the degree of emotional arousal or changes in action readiness caused by the sensory experience of pain. Factors such as  social and work situation, setting, emotional state, anxiety levels, expectation, and prior pain experience may influence pain perception and show large inter-individual differences that  may also be affected by time variables.  Patient instructions provided during this appointment: Patient Instructions   Rx for flexeril, cymbalta and mobic have been escribed to your pharmacy.  You have been given Rx for percocet to last until 12/20/2017. ____________________________________________________________________________________________  Medication Rules  Applies to: All patients receiving prescriptions (written or electronic).  Pharmacy of record: Pharmacy where electronic prescriptions will be sent. If written prescriptions are taken to a different pharmacy, please inform the nursing staff. The pharmacy listed in the electronic medical record should be the one where you would like electronic prescriptions to be sent.  Prescription refills: Only during scheduled appointments. Applies to both, written and electronic prescriptions.  NOTE: The following applies primarily to controlled substances (Opioid* Pain Medications).   Patient's responsibilities: 1. Pain Pills: Bring all pain pills to every appointment (except for procedure appointments). 2. Pill Bottles: Bring pills in original pharmacy bottle. Always bring newest bottle. Bring bottle, even if empty. 3. Medication refills: You are responsible for knowing and keeping track of what medications you need refilled. The day before your appointment, write a list of all prescriptions that need to be refilled. Bring that list to your appointment and give it to the admitting nurse. Prescriptions will be written only during appointments. If you forget a medication, it will not be "Called in", "Faxed", or "electronically sent". You will need to get another appointment to get these prescribed. 4. Prescription Accuracy: You are responsible for carefully inspecting your prescriptions before leaving our office. Have the discharge nurse carefully go over each prescription with you, before taking them home. Make sure that your name is accurately  spelled, that your address is correct. Check the name and dose of your medication to make sure it is accurate. Check the number of pills, and the written instructions to make sure they are clear and accurate. Make sure that you are given enough medication to last until your next medication refill appointment. 5. Taking Medication: Take medication as prescribed. Never take more pills than instructed. Never take medication more frequently than prescribed. Taking less pills or less frequently is permitted and encouraged, when it comes to controlled substances (written prescriptions).  6. Inform other Doctors: Always inform, all of your healthcare providers, of all the medications you take. 7. Pain Medication from other Providers: You are not allowed to accept any additional pain medication from any other Doctor or Healthcare provider. There are two exceptions to this rule. (see below) In the event that you require additional pain medication, you are responsible for notifying us, as stated below. 8. Medication Agreement: You are responsible for carefully reading and following our Medication Agreement. This must be signed before receiving any prescriptions from our practice. Safely store a copy of your signed Agreement. Violations to the Agreement will result in no further prescriptions. (Additional copies of our Medication Agreement are available upon request.) 9. Laws, Rules, & Regulations: All patients are expected to follow all Federal and [employer], Rules, [employer]. Ignorance of the Laws does not constitute a valid excuse. The use of any illegal substances is prohibited. 10. Adopted CDC guidelines & recommendations: Target dosing levels will be at or below 60 MME/day. Use of benzodiazepines** is not recommended.  Exceptions: There are only two exceptions to the rule of not receiving pain medications from other Healthcare Providers. 1. Exception #1 (  Emergencies): In the event of an emergency  (i.e.: accident requiring emergency care), you are allowed to receive additional pain medication. However, you are responsible for: As soon as you are able, call our office [phone], at any time of the day or night, and leave a message stating your name, the date and nature of the emergency, and the name and dose of the medication prescribed. In the event that your call is answered by a member of our staff, make sure to document and save the date, time, and the name of the person that took your information.  2. Exception #2 (Planned Surgery): In the event that you are scheduled by another doctor or dentist to have any type of surgery or procedure, you are allowed (for a period no longer than 30 days), to receive additional pain medication, for the acute post-op pain. However, in this case, you are responsible for picking up a copy of our "Post-op Pain Management for Surgeons" handout, and giving it to your surgeon or dentist. This document is available at our office, and does not require an appointment to obtain it. Simply go to our office during business hours (Monday-Thursday from 8:00 AM to 4:00 PM) (Friday 8:00 AM to 12:00 Noon) or if you have a scheduled appointment with [place], prior to your surgery, and ask for it by name. In addition, you will need to provide [place] with your name, name of your surgeon, type of surgery, and date of procedure or surgery.  *Opioid medications include: morphine, codeine, oxycodone, oxymorphone, hydrocodone, hydromorphone, meperidine, tramadol, tapentadol, buprenorphine, fentanyl, methadone. **Benzodiazepine medications include: diazepam (Valium), alprazolam (Xanax), clonazepam (Klonopine), lorazepam (Ativan), clorazepate (Tranxene), chlordiazepoxide (Librium), estazolam (Prosom), oxazepam (Serax), temazepam (Restoril), triazolam (Halcion) (Last updated: 08/07/2017) ____________________________________________________________________________________________   BMI  Assessment: Estimated body mass index is 29.05 kg/m as calculated from the following:   Height as of this encounter: 5' 6"  (1.676 m).   Weight as of this encounter: 180 lb (81.6 kg).  BMI interpretation table: BMI level Category Range association with higher incidence of chronic pain  <18 kg/m2 Underweight   18.5-24.9 kg/m2 Ideal body weight   25-29.9 kg/m2 Overweight Increased incidence by 20%  30-34.9 kg/m2 Obese (Class I) Increased incidence by 68%  35-39.9 kg/m2 Severe obesity (Class II) Increased incidence by 136%  >40 kg/m2 Extreme obesity (Class III) Increased incidence by 254%   Patient's current BMI Ideal Body weight  Body mass index is 29.05 kg/m. Ideal body weight: 59.3 kg (130 lb 11.7 oz) Adjusted ideal body weight: 68.2 kg (150 lb 7 oz)   BMI Readings from Last 4 Encounters:  11/10/17 29.05 kg/m  10/07/17 29.05 kg/m  09/08/17 29.05 kg/m  08/25/17 29.19 kg/m   Wt Readings from Last 4 Encounters:  11/10/17 180 lb (81.6 kg)  10/07/17 180 lb (81.6 kg)  09/08/17 180 lb (81.6 kg)  08/25/17 182 lb 14.4 oz (83 kg)

## 2017-11-10 NOTE — Progress Notes (Signed)
Nursing Pain Medication Assessment:  Safety precautions to be maintained throughout the outpatient stay will include: orient to surroundings, keep bed in low position, maintain call bell within reach at all times, provide assistance with transfer out of bed and ambulation.  Medication Inspection Compliance: Pill count conducted under aseptic conditions, in front of the patient. Neither the pills nor the bottle was removed from the patient's sight at any time. Once count was completed pills were immediately returned to the patient in their original bottle.  Medication: See above Pill/Patch Count: 28 of 120 pills remain Pill/Patch Appearance: Markings consistent with prescribed medication Bottle Appearance: Standard pharmacy container. Clearly labeled. Filled Date: 4 / 18 / 2019 Last Medication intake:  Today

## 2017-11-17 ENCOUNTER — Ambulatory Visit
Admission: RE | Admit: 2017-11-17 | Discharge: 2017-11-17 | Disposition: A | Discharge status: Home / Self Care | Payer: BLUE CROSS/BLUE SHIELD | Source: Ambulatory Visit | Attending: Family Medicine | Admitting: Family Medicine

## 2017-11-17 DIAGNOSIS — N6310 Unspecified lump in the right breast, unspecified quadrant: Secondary | ICD-10-CM

## 2017-11-17 DIAGNOSIS — N644 Mastodynia: Secondary | ICD-10-CM | POA: Diagnosis present

## 2017-11-24 ENCOUNTER — Telehealth: Payer: Self-pay | Admitting: Nurse Practitioner

## 2017-11-24 NOTE — Telephone Encounter (Signed)
Patient called stating her pharmacy only filled script for 7 days on 11-20-17. They told her insurance would only cover 7 days without prior auth. Please call [facility] to ok patient to pick up the rest of her script. Let patient know when she can pick up meds

## 2017-11-24 NOTE — Telephone Encounter (Signed)
PA sent via cover my meds.  Approved.  Called patient to let her know she should have the pharmacy re run the Rx.

## 2017-11-27 ENCOUNTER — Other Ambulatory Visit: Payer: Self-pay

## 2017-11-27 ENCOUNTER — Telehealth: Payer: Self-pay

## 2017-11-27 DIAGNOSIS — M47816 Spondylosis without myelopathy or radiculopathy, lumbar region: Secondary | ICD-10-CM

## 2017-11-27 DIAGNOSIS — G8929 Other chronic pain: Secondary | ICD-10-CM

## 2017-11-27 DIAGNOSIS — G894 Chronic pain syndrome: Secondary | ICD-10-CM

## 2017-11-27 DIAGNOSIS — M5442 Lumbago with sciatica, left side: Secondary | ICD-10-CM

## 2017-11-27 MED ORDER — OXYCODONE-ACETAMINOPHEN 10-325 MG PO TABS: 1.0000 | ORAL_TABLET | Freq: Four times a day (QID) | ORAL | 0 refills | Status: DC | PRN

## 2017-11-27 NOTE — Telephone Encounter (Signed)
Spoke with pharmacist and he states that we should be putting the dates on the PA that the script was written on. PA resent

## 2017-11-27 NOTE — Telephone Encounter (Signed)
Pt called and states pharmacy need [place] to call and verify RX ?date Percocet

## 2017-11-27 NOTE — Telephone Encounter (Signed)
Insurance company refused to back date so Pharmacist states that script must be voided.  Patient got 7 days worth filled on 11-20-17.  Pharmacist states to get another script written for 11-27-17 for a 30 day supply.  Confirmed with Cherly Anderson RN.  VN spoke with Dr Dossie Arbour and script signed.  Patient notified to pick up script.

## 2017-12-01 ENCOUNTER — Encounter: Payer: Self-pay | Admitting: Pain Medicine

## 2017-12-01 ENCOUNTER — Other Ambulatory Visit: Payer: Self-pay

## 2017-12-01 ENCOUNTER — Ambulatory Visit: Payer: BLUE CROSS/BLUE SHIELD | Attending: Pain Medicine | Admitting: Pain Medicine

## 2017-12-01 VITALS — BP 127/90 | HR 73 | Temp 98.2°F | Resp 16 | Ht 66.0 in | Wt 180.0 lb

## 2017-12-01 DIAGNOSIS — G894 Chronic pain syndrome: Secondary | ICD-10-CM | POA: Diagnosis not present

## 2017-12-01 DIAGNOSIS — M5126 Other intervertebral disc displacement, lumbar region: Secondary | ICD-10-CM | POA: Diagnosis not present

## 2017-12-01 DIAGNOSIS — G8929 Other chronic pain: Secondary | ICD-10-CM

## 2017-12-01 DIAGNOSIS — M7918 Myalgia, other site: Secondary | ICD-10-CM | POA: Diagnosis not present

## 2017-12-01 DIAGNOSIS — Z79891 Long term (current) use of opiate analgesic: Secondary | ICD-10-CM | POA: Insufficient documentation

## 2017-12-01 DIAGNOSIS — M5136 Other intervertebral disc degeneration, lumbar region with discogenic back pain only: Secondary | ICD-10-CM

## 2017-12-01 DIAGNOSIS — M47817 Spondylosis without myelopathy or radiculopathy, lumbosacral region: Secondary | ICD-10-CM | POA: Insufficient documentation

## 2017-12-01 DIAGNOSIS — M545 Low back pain: Secondary | ICD-10-CM | POA: Insufficient documentation

## 2017-12-01 DIAGNOSIS — F17210 Nicotine dependence, cigarettes, uncomplicated: Secondary | ICD-10-CM | POA: Diagnosis not present

## 2017-12-01 DIAGNOSIS — M533 Sacrococcygeal disorders, not elsewhere classified: Secondary | ICD-10-CM | POA: Insufficient documentation

## 2017-12-01 DIAGNOSIS — M48061 Spinal stenosis, lumbar region without neurogenic claudication: Secondary | ICD-10-CM | POA: Diagnosis not present

## 2017-12-01 DIAGNOSIS — E559 Vitamin D deficiency, unspecified: Secondary | ICD-10-CM | POA: Insufficient documentation

## 2017-12-01 DIAGNOSIS — M961 Postlaminectomy syndrome, not elsewhere classified: Secondary | ICD-10-CM | POA: Diagnosis not present

## 2017-12-01 DIAGNOSIS — M1288 Other specific arthropathies, not elsewhere classified, other specified site: Secondary | ICD-10-CM | POA: Diagnosis not present

## 2017-12-01 DIAGNOSIS — M5442 Lumbago with sciatica, left side: Secondary | ICD-10-CM

## 2017-12-01 DIAGNOSIS — G8918 Other acute postprocedural pain: Secondary | ICD-10-CM | POA: Insufficient documentation

## 2017-12-01 DIAGNOSIS — M47816 Spondylosis without myelopathy or radiculopathy, lumbar region: Secondary | ICD-10-CM

## 2017-12-01 DIAGNOSIS — Z885 Allergy status to narcotic agent status: Secondary | ICD-10-CM | POA: Diagnosis not present

## 2017-12-01 DIAGNOSIS — Z79899 Other long term (current) drug therapy: Secondary | ICD-10-CM | POA: Diagnosis not present

## 2017-12-01 DIAGNOSIS — F4321 Adjustment disorder with depressed mood: Secondary | ICD-10-CM | POA: Diagnosis not present

## 2017-12-01 NOTE — Progress Notes (Signed)
Safety precautions to be maintained throughout the outpatient stay will include: orient to surroundings, keep bed in low position, maintain call bell within reach at all times, provide assistance with transfer out of bed and ambulation.  

## 2017-12-01 NOTE — Progress Notes (Signed)
Patient's Name: [name]  MRN: [id number]  Referring Provider: Arnetha Courser, MD  DOB: 1970-08-03  PCP: Arnetha Courser, MD  DOS: 12/01/2017  Note by: Gaspar Cola, MD  Service setting: Ambulatory outpatient  Specialty: Interventional Pain Management  Location: [name] (AMB) Pain Management Facility    Patient type: Established   Primary Reason(s) for Visit: Evaluation of chronic illnesses with exacerbation, or progression (Level of risk: moderate) CC: Back Pain (lower)  HPI  Ms. Bo is a 47 y.o. year old, female patient, who comes today for a follow-up evaluation. She has Adjustment disorder with depressed mood; Stressful life events affecting family and household; Vitamin D deficiency; Lumbosacral spondylosis without myelopathy; Lumbar central spinal stenosis (L4-5); Breast cancer screening; Bladder irritation; Chronic low back pain (Primary Source of Pain) (midline); Arthralgia of multiple joints; Chronic, continuous use of opioids; Elevated rheumatoid factor; Chronic pain syndrome; Long term (current) use of opiate analgesic; Long term prescription opiate use; Opiate use (85.5 MME/Day); Chronic hip pain (Secondary source of pain) (Left); Failed back surgical syndrome; Discogenic low back pain; Lumbar discogenic pain syndrome; Chronic sacroiliac joint pain (Left); Chronic musculoskeletal pain; Lumbar facet syndrome (Bilateral); Lumbar spondylosis; Neurogenic pain; Depression; Acute postoperative pain; Trigger point with back pain (Left); Tobacco use; and Preventative health care on their problem list. Ms. Lerner was last seen on 07/21/2017. Her primarily concern today is the Back Pain (lower)  Pain Assessment: Location: Lower Back Radiating: denies Onset: More than a month ago Duration: Chronic pain Quality: Aching, Dull Severity: 3 /10 (subjective, self-reported pain score)  Note: Reported level is compatible with observation.                         When using our objective Pain Scale,  levels between 6 and 10/10 are said to belong in an emergency room, as it progressively worsens from a 6/10, described as severely limiting, requiring emergency care not usually available at an outpatient pain management facility. At a 6/10 level, communication becomes difficult and requires great effort. Assistance to reach the emergency department may be required. Facial flushing and profuse sweating along with potentially dangerous increases in heart rate and blood pressure will be evident. Timing: Constant Modifying factors: heat, medications, changing positions BP: 127/90  HR: 73  The patient returns to the clinics today to discuss the possibility of further interventional treatments versus a spinal cord stimulator implant. She indicates that her primary pain is in the lower back, in the midline. On 06/26/2017 she had a successful right-sided lumbar facet radiofrequency ablation. In addition on 05/06/2017 she had a successful left-sided lumbar facet + sacroiliac joint radiofrequency ablation, both of which provided patient with excellent benefits until recently when she began to experience an increase in her midline low back pain. Upon questioning the patient about the pain, she indicated that her bilateral low back pain has gone away with the radio frequencies but the midline low back pain never did completely go away. She states that it did improve about 50%, but now it seems to be getting somewhat worse. In reviewing her medical records, I see that the last time she had an MRI of the lumbar spine was on 2012. The midline low back pain could be secondary to an intervertebral disc displacement position on the posterior longitudinal ligament, and/or discogenic pain. There may also be a component of pain secondary to the interspinous ligament, which would've improved after the radiofrequency. Today I spoke to the  patient about all of these possibilities and how we need to start with the MRI. If the MRI  does not give [place] a clear answer as to what may be responsible for this pain, then will consider the possibility of a diagnostic epidurogram, as well as doing a repeat diagnostic bilateral lumbar facet block to determine if the radiofrequency is wearing off. Today I also provided patient with written information regarding the spinal cord stimulator implants so that she can begin to read about it and get a better idea of what it is and what he can do for her.  Further details on both, my assessment(s), as well as the proposed treatment plan, please see below.  Laboratory Chemistry  Inflammation Markers (CRP: Acute Phase) (ESR: Chronic Phase) Lab Results  Component Value Date   CRP 5.3 (H) 12/05/2016   ESRSEDRATE 9 12/05/2016                         Rheumatology Markers Lab Results  Component Value Date   RF 17.9 (H) 12/05/2016   ANA NEG 10/02/2016   LABURIC 2.5 09/18/2016                        Renal Function Markers Lab Results  Component Value Date   BUN 17 12/05/2016   CREATININE 0.84 12/05/2016   BCR 20 12/05/2016   GFRAA 96 12/05/2016   GFRNONAA 83 12/05/2016                             Hepatic Function Markers Lab Results  Component Value Date   AST 21 12/05/2016   ALT 20 12/05/2016   ALBUMIN 4.4 12/05/2016   ALKPHOS 59 12/05/2016   LIPASE 107 12/26/2011                        Electrolytes Lab Results  Component Value Date   NA 139 12/05/2016   K 4.6 12/05/2016   CL 103 12/05/2016   CALCIUM 9.5 12/05/2016   MG 2.1 12/05/2016   PHOS 3.0 09/18/2016                        Neuropathy Markers Lab Results  Component Value Date   VITAMINB12 495 12/05/2016                        Bone Pathology Markers Lab Results  Component Value Date   VD25OH 27.1 (L) 09/18/2016   25OHVITD1 49 12/05/2016   25OHVITD2 <1.0 12/05/2016   25OHVITD3 49 12/05/2016                         Coagulation Parameters Lab Results  Component Value Date   INR 0.9 04/16/2013    LABPROT 12.4 04/16/2013   PLT 287 10/02/2016                        Cardiovascular Markers Lab Results  Component Value Date   CKTOTAL 75 04/16/2013   CKMB 0.6 04/16/2013   TROPONINI < 0.02 04/16/2013   HGB 13.3 10/02/2016   HCT 40.8 10/02/2016                         Note: Lab results reviewed.  Recent Diagnostic  Imaging Review  Lumbosacral Imaging: Lumbar DG 2-3 views:  Results for orders placed during the hospital encounter of 03/25/11  DG Lumbar Spine 2-3 Views Ordered by RMM   Narrative *RADIOLOGY REPORT*  Clinical Data: L5-S1 laminectomy and microdiskectomy.  Reported transitional vertebral anatomy with six lumbar type vertebrae on prior outside imaging.  LUMBAR SPINE - 2-3 VIEW  Comparison: No similar prior study is available for comparison.  Findings: The most inferior lumbar type vertebral body is labeled L6, as per provided clinical history.  The most superior lumbar type vertebral body is not included in the field of view.  On the initial image obtained at 7:13 p.m., a radiopaque marker projects posterior to the spinous process of L4.  On a second image obtained at 7:35 p.m., radiopaque markers are present over the spinous processes of the L5 and L6 vertebral body levels, respectively.  IMPRESSION: Intraoperative imaging as above.  Specifically, the inferior most lumbar type vertebral body is labeled L6 as per the provided history, as no similar prior imaging is available at this institution for comparison or verification.  Original Report Authenticated By: Arline Asp, M.D.   Sacroiliac Joint Imaging: Sacroiliac Joint DG:  Results for orders placed during the hospital encounter of 12/05/16  DG Si Joints   Narrative CLINICAL DATA:  Chronic pain radiating to LEFT hip, back surgery in April 2017  EXAM: BILATERAL SACROILIAC JOINTS - 3+ VIEW  COMPARISON:  None  FINDINGS: SI joints symmetric and preserved.  Prior posterior fusion L4-L5 with  BILATERAL pedicle screws/bars and intervening disc prosthesis.  Sacrum unremarkable.  Visualized pelvis intact.  IMPRESSION: No acute abnormalities.  Prior L4-L5 fusion.   Electronically Signed   By: Lavonia Dana M.D.   On: 12/05/2016 14:29    Hip Imaging: Hip-L DG 2-3 views:  Results for orders placed during the hospital encounter of 12/05/16  DG HIP UNILAT W OR W/O PELVIS 2-3 VIEWS LEFT   Narrative CLINICAL DATA:  Chronic pain radiating to LEFT hip for couple weeks, no injury, back surgery in April 2017  EXAM: DG HIP (WITH OR WITHOUT PELVIS) 2-3V LEFT  COMPARISON:  None  FINDINGS: Osseous mineralization grossly normal for technique.  Hip and SI joint spaces preserved.  No acute fracture, dislocation, or bone destruction.  Prior L4-L5 posterior fusion with intervening disc prosthesis.  IMPRESSION: No acute osseous abnormalities.  Prior L4-L5 posterior fusion.   Electronically Signed   By: Lavonia Dana M.D.   On: 12/05/2016 14:28    Complexity Note: Imaging results reviewed. Results shared with Ms. Hunton, using Layman's terms.                         Meds   Current Outpatient Medications:  .  cyclobenzaprine (FLEXERIL) 10 MG tablet, Take 1 tablet (10 mg total) by mouth at bedtime., Disp: 30 tablet, Rfl: 0 .  DULoxetine (CYMBALTA) 30 MG capsule, Take 1 capsule (30 mg total) by mouth 2 (two) times daily., Disp: 60 capsule, Rfl: 0 .  meloxicam (MOBIC) 15 MG tablet, Take 1 tablet (15 mg total) by mouth daily., Disp: 30 tablet, Rfl: 0 .  oxyCODONE-acetaminophen (PERCOCET) 10-325 MG tablet, Take 1 tablet by mouth every 6 (six) hours as needed for pain., Disp: 120 tablet, Rfl: 0  ROS  Constitutional: Denies any fever or chills Gastrointestinal: No reported hemesis, hematochezia, vomiting, or acute GI distress Musculoskeletal: Denies any acute onset joint swelling, redness, loss of ROM, or weakness Neurological: No  reported episodes of acute onset apraxia,  aphasia, dysarthria, agnosia, amnesia, paralysis, loss of coordination, or loss of consciousness  Allergies  Ms. Rasmusson is allergic to codeine.  PFSH  Drug: Ms. Nakama  reports that she does not use drugs. Alcohol:  reports that she does not drink alcohol. Tobacco:  reports that she has been smoking cigarettes.  She has a 20.00 pack-year smoking history. She has never used smokeless tobacco. Medical:  has a past medical history of Allergy, Anxiety, Chronic bilateral low back pain (07/22/2016), and Depression. Surgical: Ms. Vonseggern  has a past surgical history that includes Abdominal hysterectomy; Tonsillectomy; Cesarean section; Spine surgery; Spine surgery (09/09/2015); and Back surgery. Family: family history includes Arthritis in her father; Breast cancer (age of onset: 24) in her maternal aunt; Cancer in her maternal aunt; Depression in her sister and sister; Diabetes in her mother; Heart disease in her father and maternal grandfather; Hyperlipidemia in her father; Hypertension in her father and mother; Lung disease in her father; Lymphoma in her father.  Constitutional Exam  General appearance: Well nourished, well developed, and well hydrated. In no apparent acute distress Vitals:   12/01/17 1109  BP: 127/90  Pulse: 73  Resp: 16  Temp: 98.2 F (36.8 C)  TempSrc: Oral  SpO2: 99%  Weight: 180 lb (81.6 kg)  Height: 5' 6" (1.676 m)   BMI Assessment: Estimated body mass index is 29.05 kg/m as calculated from the following:   Height as of this encounter: 5' 6" (1.676 m).   Weight as of this encounter: 180 lb (81.6 kg).  BMI interpretation table: BMI level Category Range association with higher incidence of chronic pain  <18 kg/m2 Underweight   18.5-24.9 kg/m2 Ideal body weight   25-29.9 kg/m2 Overweight Increased incidence by 20%  30-34.9 kg/m2 Obese (Class I) Increased incidence by 68%  35-39.9 kg/m2 Severe obesity (Class II) Increased incidence by 136%  >40 kg/m2 Extreme obesity (Class  III) Increased incidence by 254%   Patient's current BMI Ideal Body weight  Body mass index is 29.05 kg/m. Ideal body weight: 59.3 kg (130 lb 11.7 oz) Adjusted ideal body weight: 68.2 kg (150 lb 7 oz)   BMI Readings from Last 4 Encounters:  12/01/17 29.05 kg/m  11/10/17 29.05 kg/m  10/07/17 29.05 kg/m  09/08/17 29.05 kg/m   Wt Readings from Last 4 Encounters:  12/01/17 180 lb (81.6 kg)  11/10/17 180 lb (81.6 kg)  10/07/17 180 lb (81.6 kg)  09/08/17 180 lb (81.6 kg)  Psych/Mental status: Alert, oriented x 3 (person, place, & time)       Eyes: PERLA Respiratory: No evidence of acute respiratory distress  Cervical Spine Area Exam  Skin & Axial Inspection: No masses, redness, edema, swelling, or associated skin lesions Alignment: Symmetrical Functional ROM: Unrestricted ROM      Stability: No instability detected Muscle Tone/Strength: Functionally intact. No obvious neuro-muscular anomalies detected. Sensory (Neurological): Unimpaired Palpation: No palpable anomalies              Upper Extremity (UE) Exam    Side: Right upper extremity  Side: Left upper extremity  Skin & Extremity Inspection: Skin color, temperature, and hair growth are WNL. No peripheral edema or cyanosis. No masses, redness, swelling, asymmetry, or associated skin lesions. No contractures.  Skin & Extremity Inspection: Skin color, temperature, and hair growth are WNL. No peripheral edema or cyanosis. No masses, redness, swelling, asymmetry, or associated skin lesions. No contractures.  Functional ROM: Unrestricted ROM  Functional ROM: Unrestricted ROM          Muscle Tone/Strength: Functionally intact. No obvious neuro-muscular anomalies detected.  Muscle Tone/Strength: Functionally intact. No obvious neuro-muscular anomalies detected.  Sensory (Neurological): Unimpaired          Sensory (Neurological): Unimpaired          Palpation: No palpable anomalies              Palpation: No palpable anomalies               Provocative Test(s):  Phalen's test: deferred Tinel's test: deferred Apley's scratch test (touch opposite shoulder):  Action 1 (Across chest): deferred Action 2 (Overhead): deferred Action 3 (LB reach): deferred   Provocative Test(s):  Phalen's test: deferred Tinel's test: deferred Apley's scratch test (touch opposite shoulder):  Action 1 (Across chest): deferred Action 2 (Overhead): deferred Action 3 (LB reach): deferred    Thoracic Spine Area Exam  Skin & Axial Inspection: No masses, redness, or swelling Alignment: Symmetrical Functional ROM: Unrestricted ROM Stability: No instability detected Muscle Tone/Strength: Functionally intact. No obvious neuro-muscular anomalies detected. Sensory (Neurological): Unimpaired Muscle strength & Tone: No palpable anomalies  Lumbar Spine Area Exam  Skin & Axial Inspection: No masses, redness, or swelling Alignment: Symmetrical Functional ROM: Unrestricted ROM       Stability: No instability detected Muscle Tone/Strength: Functionally intact. No obvious neuro-muscular anomalies detected. Sensory (Neurological): Unimpaired Palpation: No palpable anomalies       Provocative Tests: Lumbar Hyperextension/rotation test: deferred today       Lumbar quadrant test (Kemp's test): deferred today       Lumbar Lateral bending test: deferred today       Patrick's Maneuver: deferred today                   FABER test: deferred today       Thigh-thrust test: deferred today       S-I compression test: deferred today       S-I distraction test: deferred today        Gait & Posture Assessment  Ambulation: Unassisted Gait: Relatively normal for age and body habitus Posture: WNL   Lower Extremity Exam    Side: Right lower extremity  Side: Left lower extremity  Stability: No instability observed          Stability: No instability observed          Skin & Extremity Inspection: Skin color, temperature, and hair growth are WNL. No peripheral  edema or cyanosis. No masses, redness, swelling, asymmetry, or associated skin lesions. No contractures.  Skin & Extremity Inspection: Skin color, temperature, and hair growth are WNL. No peripheral edema or cyanosis. No masses, redness, swelling, asymmetry, or associated skin lesions. No contractures.  Functional ROM: Unrestricted ROM                  Functional ROM: Unrestricted ROM                  Muscle Tone/Strength: Functionally intact. No obvious neuro-muscular anomalies detected.  Muscle Tone/Strength: Functionally intact. No obvious neuro-muscular anomalies detected.  Sensory (Neurological): Unimpaired  Sensory (Neurological): Unimpaired  Palpation: No palpable anomalies  Palpation: No palpable anomalies   Assessment  Primary Diagnosis & Pertinent Problem List: The primary encounter diagnosis was Chronic low back pain (Primary Source of Pain) (midline). Diagnoses of Failed back surgical syndrome, Discogenic low back pain, Lumbar central spinal stenosis (L4-5), Lumbar discogenic pain syndrome, and Lumbar  facet syndrome (Bilateral) were also pertinent to this visit.  Status Diagnosis  Controlled Controlled Controlled 1. Chronic low back pain (Primary Source of Pain) (midline)   2. Failed back surgical syndrome   3. Discogenic low back pain   4. Lumbar central spinal stenosis (L4-5)   5. Lumbar discogenic pain syndrome   6. Lumbar facet syndrome (Bilateral)     Problems updated and reviewed during this visit: No problems updated. Plan of Care  Pharmacotherapy (Medications Ordered): No orders of the defined types were placed in this encounter.  Medications administered today: Butch Penny. Cassidy had no medications administered during this visit.  Procedure Orders    No procedure(s) ordered today   Lab Orders  No laboratory test(s) ordered today    Imaging Orders     MR LUMBAR SPINE WO CONTRAST Referral Orders  No referral(s) requested today    Interventional management  options: Planned, scheduled, and/or pending:   Lumbar MRI    Considering:   Diagnostic bilateral lumbar facet block(NO STEROIDS) Possible bilateral lumbar facet RFA(right side done on 06/26/2017; left side on 05/06/2017) Diagnostic left sided caudal epidural steroid injection + diagnostic epidurogram Possible left-sided Racz procedure Diagnostic left L4-5 interlaminar lumbar epidural steroid injection Diagnosticleft L4-5 transforaminal epidural steroid injection Diagnostic left L4 selective nerve root block Possible left L4 nerve root ganglion RFA Possible lumbar spinal cord stimulator trial Diagnostic Lumbar provocative discogram  Possible IDET procedure    Palliative PRN treatment(s):   Diagnostic/palliative bilateral lumbar facet block(NO STEROIDS)# 3    Provider-requested follow-up: Return for F/U eval (after test completion).  Future Appointments  Date Time Provider [place]  12/10/2017 12:45 PM Vevelyn Francois, NP [facility] None   Primary Care Physician: Arnetha Courser, MD Location: [facility] Note by: Gaspar Cola, MD Date: 12/01/2017; Time: 12:06 PM

## 2017-12-05 ENCOUNTER — Telehealth: Payer: Self-pay | Admitting: *Deleted

## 2017-12-10 ENCOUNTER — Encounter: Payer: Self-pay | Admitting: Nurse Practitioner

## 2017-12-10 ENCOUNTER — Ambulatory Visit: Payer: BLUE CROSS/BLUE SHIELD | Attending: Nurse Practitioner | Admitting: Nurse Practitioner

## 2017-12-10 VITALS — BP 120/83 | HR 89 | Temp 97.9°F | Resp 16 | Ht 66.0 in | Wt 180.0 lb

## 2017-12-10 DIAGNOSIS — M5442 Lumbago with sciatica, left side: Secondary | ICD-10-CM | POA: Diagnosis not present

## 2017-12-10 DIAGNOSIS — Z90710 Acquired absence of both cervix and uterus: Secondary | ICD-10-CM | POA: Diagnosis not present

## 2017-12-10 DIAGNOSIS — M7918 Myalgia, other site: Secondary | ICD-10-CM | POA: Diagnosis not present

## 2017-12-10 DIAGNOSIS — Z79891 Long term (current) use of opiate analgesic: Secondary | ICD-10-CM

## 2017-12-10 DIAGNOSIS — F4321 Adjustment disorder with depressed mood: Secondary | ICD-10-CM | POA: Diagnosis not present

## 2017-12-10 DIAGNOSIS — G894 Chronic pain syndrome: Secondary | ICD-10-CM

## 2017-12-10 DIAGNOSIS — E559 Vitamin D deficiency, unspecified: Secondary | ICD-10-CM | POA: Diagnosis not present

## 2017-12-10 DIAGNOSIS — Z79899 Other long term (current) drug therapy: Secondary | ICD-10-CM | POA: Insufficient documentation

## 2017-12-10 DIAGNOSIS — G8929 Other chronic pain: Secondary | ICD-10-CM | POA: Diagnosis not present

## 2017-12-10 DIAGNOSIS — Z833 Family history of diabetes mellitus: Secondary | ICD-10-CM | POA: Insufficient documentation

## 2017-12-10 DIAGNOSIS — M47816 Spondylosis without myelopathy or radiculopathy, lumbar region: Secondary | ICD-10-CM

## 2017-12-10 DIAGNOSIS — M48061 Spinal stenosis, lumbar region without neurogenic claudication: Secondary | ICD-10-CM | POA: Diagnosis not present

## 2017-12-10 DIAGNOSIS — M549 Dorsalgia, unspecified: Secondary | ICD-10-CM | POA: Insufficient documentation

## 2017-12-10 DIAGNOSIS — Z9889 Other specified postprocedural states: Secondary | ICD-10-CM | POA: Diagnosis not present

## 2017-12-10 DIAGNOSIS — Z803 Family history of malignant neoplasm of breast: Secondary | ICD-10-CM | POA: Insufficient documentation

## 2017-12-10 DIAGNOSIS — Z885 Allergy status to narcotic agent status: Secondary | ICD-10-CM | POA: Diagnosis not present

## 2017-12-10 DIAGNOSIS — F17210 Nicotine dependence, cigarettes, uncomplicated: Secondary | ICD-10-CM | POA: Diagnosis not present

## 2017-12-10 DIAGNOSIS — Z8249 Family history of ischemic heart disease and other diseases of the circulatory system: Secondary | ICD-10-CM | POA: Diagnosis not present

## 2017-12-10 DIAGNOSIS — N644 Mastodynia: Secondary | ICD-10-CM | POA: Diagnosis not present

## 2017-12-10 DIAGNOSIS — Z5181 Encounter for therapeutic drug level monitoring: Secondary | ICD-10-CM | POA: Insufficient documentation

## 2017-12-10 DIAGNOSIS — M47817 Spondylosis without myelopathy or radiculopathy, lumbosacral region: Secondary | ICD-10-CM | POA: Insufficient documentation

## 2017-12-10 MED ORDER — CYCLOBENZAPRINE HCL 10 MG PO TABS: 10.0000 mg | ORAL_TABLET | Freq: Every day | ORAL | 0 refills | Status: DC

## 2017-12-10 MED ORDER — OXYCODONE-ACETAMINOPHEN 10-325 MG PO TABS: 1.0000 | ORAL_TABLET | Freq: Four times a day (QID) | ORAL | 0 refills | Status: DC | PRN

## 2017-12-10 MED ORDER — DULOXETINE HCL 30 MG PO CPEP
30.0000 mg | ORAL_CAPSULE | Freq: Two times a day (BID) | ORAL | 0 refills | Status: DC
Start: 2017-12-10 — End: 2018-01-05

## 2017-12-10 MED ORDER — MELOXICAM 15 MG PO TABS: 15.0000 mg | ORAL_TABLET | Freq: Every day | ORAL | 0 refills | Status: DC

## 2017-12-10 NOTE — Progress Notes (Signed)
Patient's Name: [name]  MRN: [id number]  Referring Provider: Arnetha Courser, MD  DOB: [birth year]  PCP: Arnetha Courser, MD  DOS: 12/10/2017  Note by: Vevelyn Francois NP  Service setting: Ambulatory outpatient  Specialty: Interventional Pain Management  Location: [name] (AMB) Pain Management Facility    Patient type: Established    Primary Reason(s) for Visit: Encounter for prescription drug management. (Level of risk: moderate)  CC: Back Pain (lower middle )  HPI  [name] is a 47 y.o. year old, female patient, who comes today for a medication management evaluation. She has Adjustment disorder with depressed mood; Stressful life events affecting family and household; Vitamin D deficiency; Lumbosacral spondylosis without myelopathy; Lumbar central spinal stenosis (L4-5); Breast cancer screening; Bladder irritation; Chronic low back pain (Primary Source of Pain) (midline); Arthralgia of multiple joints; Chronic, continuous use of opioids; Elevated rheumatoid factor; Chronic pain syndrome; Long term (current) use of opiate analgesic; Long term prescription opiate use; Opiate use (85.5 MME/Day); Chronic hip pain (Secondary source of pain) (Left); Failed back surgical syndrome; Discogenic low back pain; Lumbar discogenic pain syndrome; Chronic sacroiliac joint pain (Left); Chronic musculoskeletal pain; Lumbar facet syndrome (Bilateral); Lumbar spondylosis; Neurogenic pain; Depression; Acute postoperative pain; Trigger point with back pain (Left); Tobacco use; and Preventative health care on their problem list. Her primarily concern today is the Back Pain (lower middle )  Pain Assessment: Location: Lower, Mid Back Radiating: denies  Onset: More than a month ago Duration: Chronic pain Quality: Aching, Dull, Discomfort, Constant Severity: 3 /10 (subjective, self-reported pain score)  Note: Reported level is compatible with observation.                          Effect on ADL: standing or sitting too long  increases the pain.  Timing: Constant Modifying factors: heat, frequent position changes.  medications BP: 120/83  HR: 89  [name] was last scheduled for an appointment on 11/24/2017 for medication management. During today's appointment we reviewed [name]'s chronic pain status, as well as her outpatient medication regimen.  His concerns or questions today.  She is scheduled for MRI and approximately 2 weeks.  She admits that she did not seek out additional pain management for possible fentanyl patches as she had requested previously  the patient  reports that she does not use drugs. Her body mass index is 29.05 kg/m.  Further details on both, my assessment(s), as well as the proposed treatment plan, please see below.  Controlled Substance Pharmacotherapy Assessment REMS (Risk Evaluation and Mitigation Strategy)  Analgesic:Oxycodone/acetaminophenIR 10/368m every 6 hours (449mdayof oxycodone) (60MME/day) MME/day:6085may.    [name]  12/10/2017 12:58 PM  Sign at close encounter Nursing Pain Medication Assessment:  Safety precautions to be maintained throughout the outpatient stay will include: orient to surroundings, keep bed in low position, maintain call bell within reach at all times, provide assistance with transfer out of bed and ambulation.  Medication Inspection Compliance: Pill count conducted under aseptic conditions, in front of the patient. Neither the pills nor the bottle was removed from the patient's sight at any time. Once count was completed pills were immediately returned to the patient in their original bottle.  Medication: Oxycodone/APAP Pill/Patch Count: 70.5 of 120 pills remain Pill/Patch Appearance: Markings consistent with prescribed medication Bottle Appearance: Standard pharmacy container. Clearly labeled. Filled Date: 06 / 21 / 2019 Last Medication intake:  Today Pharmacokinetics: Liberation and absorption (onset of action):  WNL Distribution (time to peak effect): WNL Metabolism and excretion (duration of action): WNL         Pharmacodynamics: Desired effects: Analgesia: Ms. Giambra reports >50% benefit. Functional ability: Patient reports that medication allows her to accomplish basic ADLs Clinically meaningful improvement in function (CMIF): Sustained CMIF goals met Perceived effectiveness: Described as relatively effective, allowing for increase in activities of daily living (ADL) Undesirable effects: Side-effects or Adverse reactions: None reported Monitoring: [place] PMP: Online review of the past 28-monthperiod conducted. Compliant with practice rules and regulations Last UDS on record: Summary  Date Value Ref Range Status  08/14/2017 FINAL  Final    Comment:    ==================================================================== TOXASSURE SELECT 13 (MW) ==================================================================== Test                             Result       Flag       Units Drug Present and Declared for Prescription Verification   Oxycodone                      >2597        EXPECTED   ng/mg creat   Oxymorphone                    905          EXPECTED   ng/mg creat   Noroxycodone                   >2597        EXPECTED   ng/mg creat   Noroxymorphone                 1255         EXPECTED   ng/mg creat    Sources of oxycodone are scheduled prescription medications.    Oxymorphone, noroxycodone, and noroxymorphone are expected    metabolites of oxycodone. Oxymorphone is also available as a    scheduled prescription medication. ==================================================================== Test                      Result    Flag   Units      Ref Range   Creatinine              385              mg/dL      >=20 ==================================================================== Declared Medications:  The flagging and interpretation on this report are based on the  following declared  medications.  Unexpected results may arise from  inaccuracies in the declared medications.  **Note: The testing scope of this panel includes these medications:  Oxycodone (Percocet)  **Note: The testing scope of this panel does not include following  reported medications:  Acetaminophen (Percocet)  Cyclobenzaprine (Flexeril)  Duloxetine (Cymbalta)  Meloxicam (Mobic) ==================================================================== For clinical consultation, please call ([phone] ====================================================================    UDS interpretation: Compliant          Medication Assessment Form: Reviewed. Patient indicates being compliant with therapy Treatment compliance: Compliant Risk Assessment Profile: Aberrant behavior: See prior evaluations.  Patient made the comment today how is my pill count Comorbid factors increasing risk of overdose: See prior notes. No additional risks detected today Risk of substance use disorder (SUD): Low Opioid Risk Tool - 11/10/17 1259      Family History of Substance Abuse   Alcohol  Negative  Illegal Drugs  Negative    Rx Drugs  Negative      Personal History of Substance Abuse   Alcohol  Negative    Illegal Drugs  Negative    Rx Drugs  Negative      Psychological Disease   Psychological Disease  Negative    Depression  Negative      Total Score   Opioid Risk Tool Scoring  0    Opioid Risk Interpretation  Low Risk      ORT Scoring interpretation table:  Score <3 = Low Risk for SUD  Score between 4-7 = Moderate Risk for SUD  Score >8 = High Risk for Opioid Abuse   Risk Mitigation Strategies:  Patient Counseling: Covered Patient-Prescriber Agreement (PPA): Present and active  Notification to other healthcare providers: Done  Pharmacologic Plan: No change in therapy, at this time.             Laboratory Chemistry  Inflammation Markers (CRP: Acute Phase) (ESR: Chronic Phase) Lab Results   Component Value Date   CRP 5.3 (H) 12/05/2016   ESRSEDRATE 9 12/05/2016                         Rheumatology Markers Lab Results  Component Value Date   RF 17.9 (H) 12/05/2016   ANA NEG 10/02/2016   LABURIC 2.5 09/18/2016                        Renal Function Markers Lab Results  Component Value Date   BUN 17 12/05/2016   CREATININE 0.84 12/05/2016   BCR 20 12/05/2016   GFRAA 96 12/05/2016   GFRNONAA 83 12/05/2016                             Hepatic Function Markers Lab Results  Component Value Date   AST 21 12/05/2016   ALT 20 12/05/2016   ALBUMIN 4.4 12/05/2016   ALKPHOS 59 12/05/2016   LIPASE 107 12/26/2011                        Electrolytes Lab Results  Component Value Date   NA 139 12/05/2016   K 4.6 12/05/2016   CL 103 12/05/2016   CALCIUM 9.5 12/05/2016   MG 2.1 12/05/2016   PHOS 3.0 09/18/2016                        Neuropathy Markers Lab Results  Component Value Date   VITAMINB12 495 12/05/2016                        Bone Pathology Markers Lab Results  Component Value Date   VD25OH 27.1 (L) 09/18/2016   25OHVITD1 49 12/05/2016   25OHVITD2 <1.0 12/05/2016   25OHVITD3 49 12/05/2016                         Coagulation Parameters Lab Results  Component Value Date   INR 0.9 04/16/2013   LABPROT 12.4 04/16/2013   PLT 287 10/02/2016                        Cardiovascular Markers Lab Results  Component Value Date   CKTOTAL 75 04/16/2013   CKMB 0.6 04/16/2013   TROPONINI < 0.02  04/16/2013   HGB 13.3 10/02/2016   HCT 40.8 10/02/2016                         CA Markers No results found for: CEA, CA125, LABCA2                      Note: Lab results reviewed.  Recent Diagnostic Imaging Results  MM DIAG BREAST TOMO BILATERAL CLINICAL DATA:  Patient presents with area of palpable fullness and tenderness in the supra-areolar region of the right breast.  EXAM: DIGITAL DIAGNOSTIC BILATERAL MAMMOGRAM WITH CAD AND TOMO  ULTRASOUND RIGHT  BREAST  COMPARISON:  Previous exam(s).  ACR Breast Density Category b: There are scattered areas of fibroglandular density.  FINDINGS: On the left, there is a stable circumscribed mass medially consistent with a cyst.  There are no other discrete masses, no areas of architectural distortion and no new or suspicious calcifications.  Mammographic images were processed with CAD.  On physical exam, no mass is palpated in the upper right breast.  Targeted ultrasound is performed, showing normal tissue throughout the upper aspect of the right breast in the area of focal tenderness. No mass or suspicious lesion.  IMPRESSION: No evidence of breast malignancy. Stable benign appearing mass in the left breast, most likely a cyst.  RECOMMENDATION: Screening mammogram in one year.(Code:SM-B-01Y)  I have discussed the findings and recommendations with the patient. Results were also provided in writing at the conclusion of the visit. If applicable, a reminder letter will be sent to the patient regarding the next appointment.  BI-RADS CATEGORY  2: Benign.  Electronically Signed   By: Lajean Manes M.D.   On: 11/17/2017 14:15 US BREAST LTD UNI RIGHT INC AXILLA CLINICAL DATA:  Patient presents with area of palpable fullness and tenderness in the supra-areolar region of the right breast.  EXAM: DIGITAL DIAGNOSTIC BILATERAL MAMMOGRAM WITH CAD AND TOMO  ULTRASOUND RIGHT BREAST  COMPARISON:  Previous exam(s).  ACR Breast Density Category b: There are scattered areas of fibroglandular density.  FINDINGS: On the left, there is a stable circumscribed mass medially consistent with a cyst.  There are no other discrete masses, no areas of architectural distortion and no new or suspicious calcifications.  Mammographic images were processed with CAD.  On physical exam, no mass is palpated in the upper right breast.  Targeted ultrasound is performed, showing normal tissue  throughout the upper aspect of the right breast in the area of focal tenderness. No mass or suspicious lesion.  IMPRESSION: No evidence of breast malignancy. Stable benign appearing mass in the left breast, most likely a cyst.  RECOMMENDATION: Screening mammogram in one year.(Code:SM-B-01Y)  I have discussed the findings and recommendations with the patient. Results were also provided in writing at the conclusion of the visit. If applicable, a reminder letter will be sent to the patient regarding the next appointment.  BI-RADS CATEGORY  2: Benign.  Electronically Signed   By: Lajean Manes M.D.   On: 11/17/2017 14:15  Complexity Note: Imaging results reviewed. Results shared with Ms. Komatsu, using Layman's terms.                         Meds   Current Outpatient Medications:  .  cyclobenzaprine (FLEXERIL) 10 MG tablet, Take 1 tablet (10 mg total) by mouth at bedtime., Disp: 30 tablet, Rfl: 0 .  DULoxetine (CYMBALTA) 30 MG capsule, Take  1 capsule (30 mg total) by mouth 2 (two) times daily., Disp: 60 capsule, Rfl: 0 .  meloxicam (MOBIC) 15 MG tablet, Take 1 tablet (15 mg total) by mouth daily., Disp: 30 tablet, Rfl: 0 .  oxyCODONE-acetaminophen (PERCOCET) 10-325 MG tablet, Take 1 tablet by mouth every 6 (six) hours as needed for pain., Disp: 120 tablet, Rfl: 0  ROS  Constitutional: Denies any fever or chills Gastrointestinal: No reported hemesis, hematochezia, vomiting, or acute GI distress Musculoskeletal: Denies any acute onset joint swelling, redness, loss of ROM, or weakness Neurological: No reported episodes of acute onset apraxia, aphasia, dysarthria, agnosia, amnesia, paralysis, loss of coordination, or loss of consciousness  Allergies  Ms. Wycoff is allergic to codeine.  PFSH  Drug: Ms. Hollibaugh  reports that she does not use drugs. Alcohol:  reports that she does not drink alcohol. Tobacco:  reports that she has been smoking cigarettes.  She has a 20.00 pack-year smoking  history. She has never used smokeless tobacco. Medical:  has a past medical history of Allergy, Anxiety, Chronic bilateral low back pain (07/22/2016), and Depression. Surgical: Ms. Swearingin  has a past surgical history that includes Abdominal hysterectomy; Tonsillectomy; Cesarean section; Spine surgery; Spine surgery (09/09/2015); and Back surgery. Family: family history includes Arthritis in her father; Breast cancer (age of onset: 29) in her maternal aunt; Cancer in her maternal aunt; Depression in her sister and sister; Diabetes in her mother; Heart disease in her father and maternal grandfather; Hyperlipidemia in her father; Hypertension in her father and mother; Lung disease in her father; Lymphoma in her father.  Constitutional Exam  General appearance: Well nourished, well developed, and well hydrated. In no apparent acute distress Vitals:   12/10/17 1247  BP: 120/83  Pulse: 89  Resp: 16  Temp: 97.9 F (36.6 C)  TempSrc: Oral  SpO2: 98%  Weight: 180 lb (81.6 kg)  Height: _0  (1.676 m)  Psych/Mental status: Alert, oriented x 3 (person, place, & time)       Eyes: PERLA Respiratory: No evidence of acute respiratory distress  Lumbar Spine Area Exam  Skin & Axial Inspection: Well healed scar from previous spine surgery detected Alignment: Symmetrical Functional ROM: Unrestricted ROM       Stability: No instability detected Muscle Tone/Strength: Functionally intact. No obvious neuro-muscular anomalies detected. Sensory (Neurological): Unimpaired Palpation: No palpable anomalies       Provocative Tests: Lumbar Hyperextension/rotation test: deferred today       Lumbar quadrant test (Kemp's test): deferred today       Lumbar Lateral bending test: deferred today       Patrick's Maneuver: deferred today                   FABER test: deferred today       Thigh-thrust test: deferred today       S-I compression test: deferred today       S-I distraction test: deferred today        Gait &  Posture Assessment  Ambulation: Unassisted Gait: Relatively normal for age and body habitus Posture: WNL   Lower Extremity Exam    Side: Right lower extremity  Side: Left lower extremity  Stability: No instability observed          Stability: No instability observed          Skin & Extremity Inspection: Skin color, temperature, and hair growth are WNL. No peripheral edema or cyanosis. No masses, redness, swelling, asymmetry, or associated skin  lesions. No contractures.  Skin & Extremity Inspection: Skin color, temperature, and hair growth are WNL. No peripheral edema or cyanosis. No masses, redness, swelling, asymmetry, or associated skin lesions. No contractures.  Functional ROM: Unrestricted ROM                  Functional ROM: Unrestricted ROM                  Muscle Tone/Strength: Functionally intact. No obvious neuro-muscular anomalies detected.  Muscle Tone/Strength: Functionally intact. No obvious neuro-muscular anomalies detected.  Sensory (Neurological): Unimpaired  Sensory (Neurological): Unimpaired  Palpation: No palpable anomalies  Palpation: No palpable anomalies   Assessment  Primary Diagnosis & Pertinent Problem List: The primary encounter diagnosis was Chronic low back pain (Location of Primary Source of Pain) (midline). Diagnoses of Lumbar facet syndrome (Bilateral), Chronic low back pain (Primary Source of Pain) (midline), Lumbar spondylosis, Chronic pain syndrome, and Musculoskeletal pain were also pertinent to this visit.  Status Diagnosis  Controlled Controlled Controlled 1. Chronic low back pain (Location of Primary Source of Pain) (midline)   2. Lumbar facet syndrome (Bilateral)   3. Chronic low back pain (Primary Source of Pain) (midline)   4. Lumbar spondylosis   5. Chronic pain syndrome   6. Musculoskeletal pain     Problems updated and reviewed during this visit: No problems updated. Plan of Care  Pharmacotherapy (Medications Ordered): No orders of the  defined types were placed in this encounter.  New Prescriptions   No medications on file   Medications administered today: Kumari Sculley. Poer had no medications administered during this visit. Lab-work, procedure(s), and/or referral(s): No orders of the defined types were placed in this encounter.  Imaging and/or referral(s): None  Interventional management options: Planned, scheduled, and/or pending:  Not at this time. Patient may consider different office to have her treatment changed to fentanyl patch.    Considering:  Diagnostic bilateral lumbar facet block Possible bilateral lumbar facet RFA Diagnostic left sided caudal epidural steroid injection + diagnostic epidurogram Possible left-sided Racz procedure Diagnostic left L4-5 interlaminar lumbar epidural steroid injection Diagnosticleft L4-5 transforaminal epidural steroid injection Diagnostic left L4 selective nerve root block Possible left L4 nerve root ganglion RFA Possible lumbar spinal cord stimulator trial   Palliative PRN treatment(s):  Diagnostic bilateral lumbar facet block(NO STEROIDS      Provider-requested follow-up: Return in about 2 weeks (around 12/24/2017) for F/U eval follow MRI poss change need in treatment.  Future Appointments  Date Time Provider [place]  12/22/2017  6:00 PM [name]   Primary Care Physician: Arnetha Courser, MD Location: [facility] Note by: Vevelyn Francois NP Date: 12/10/2017; Time: 1:11 PM  Pain Score Disclaimer: We use the NRS-11 scale. This is a self-reported, subjective measurement of pain severity with only modest accuracy. It is used primarily to identify changes within a particular patient. It must be understood that outpatient pain scales are significantly less accurate that those used for research, where they can be applied under ideal controlled circumstances with minimal exposure to variables. In reality, the  score is likely to be a combination of pain intensity and pain affect, where pain affect describes the degree of emotional arousal or changes in action readiness caused by the sensory experience of pain. Factors such as social and work situation, setting, emotional state, anxiety levels, expectation, and prior pain experience may influence pain perception and show large inter-individual differences that may also be affected  by time variables.  Patient instructions provided during this appointment: Patient Instructions  ____________________________________________________________________________________________  Medication Rules  Applies to: All patients receiving prescriptions (written or electronic).  Pharmacy of record: Pharmacy where electronic prescriptions will be sent. If written prescriptions are taken to a different pharmacy, please inform the nursing staff. The pharmacy listed in the electronic medical record should be the one where you would like electronic prescriptions to be sent.  Prescription refills: Only during scheduled appointments. Applies to both, written and electronic prescriptions.  NOTE: The following applies primarily to controlled substances (Opioid* Pain Medications).   Patient's responsibilities: 1. Pain Pills: Bring all pain pills to every appointment (except for procedure appointments). 2. Pill Bottles: Bring pills in original pharmacy bottle. Always bring newest bottle. Bring bottle, even if empty. 3. Medication refills: You are responsible for knowing and keeping track of what medications you need refilled. The day before your appointment, write a list of all prescriptions that need to be refilled. Bring that list to your appointment and give it to the admitting nurse. Prescriptions will be written only during appointments. If you forget a medication, it will not be "Called in", "Faxed", or "electronically sent". You will need to get another appointment to get these  prescribed. 4. Prescription Accuracy: You are responsible for carefully inspecting your prescriptions before leaving our office. Have the discharge nurse carefully go over each prescription with you, before taking them home. Make sure that your name is accurately spelled, that your address is correct. Check the name and dose of your medication to make sure it is accurate. Check the number of pills, and the written instructions to make sure they are clear and accurate. Make sure that you are given enough medication to last until your next medication refill appointment. 5. Taking Medication: Take medication as prescribed. Never take more pills than instructed. Never take medication more frequently than prescribed. Taking less pills or less frequently is permitted and encouraged, when it comes to controlled substances (written prescriptions).  6. Inform other Doctors: Always inform, all of your healthcare providers, of all the medications you take. 7. Pain Medication from other Providers: You are not allowed to accept any additional pain medication from any other Doctor or Healthcare provider. There are two exceptions to this rule. (see below) In the event that you require additional pain medication, you are responsible for notifying us, as stated below. 8. Medication Agreement: You are responsible for carefully reading and following our Medication Agreement. This must be signed before receiving any prescriptions from our practice. Safely store a copy of your signed Agreement. Violations to the Agreement will result in no further prescriptions. (Additional copies of our Medication Agreement are available upon request.) 9. Laws, Rules, & Regulations: All patients are expected to follow all Federal and [employer], [employer], Rules, [employer]. Ignorance of the Laws does not constitute a valid excuse. The use of any illegal substances is prohibited. 10. Adopted CDC guidelines & recommendations: Target dosing  levels will be at or below 60 MME/day. Use of benzodiazepines** is not recommended.  Exceptions: There are only two exceptions to the rule of not receiving pain medications from other Healthcare Providers. 1. Exception #1 (Emergencies): In the event of an emergency (i.e.: accident requiring emergency care), you are allowed to receive additional pain medication. However, you are responsible for: As soon as you are able, call our office [phone], at any time of the day or night, and leave a message stating your name, the date  and nature of the emergency, and the name and dose of the medication prescribed. In the event that your call is answered by a member of our staff, make sure to document and save the date, time, and the name of the person that took your information.  2. Exception #2 (Planned Surgery): In the event that you are scheduled by another doctor or dentist to have any type of surgery or procedure, you are allowed (for a period no longer than 30 days), to receive additional pain medication, for the acute post-op pain. However, in this case, you are responsible for picking up a copy of our "Post-op Pain Management for Surgeons" handout, and giving it to your surgeon or dentist. This document is available at our office, and does not require an appointment to obtain it. Simply go to our office during business hours (Monday-Thursday from 8:00 AM to 4:00 PM) (Friday 8:00 AM to 12:00 Noon) or if you have a scheduled appointment with [place], prior to your surgery, and ask for it by name. In addition, you will need to provide [place] with your name, name of your surgeon, type of surgery, and date of procedure or surgery.  *Opioid medications include: morphine, codeine, oxycodone, oxymorphone, hydrocodone, hydromorphone, meperidine, tramadol, tapentadol, buprenorphine, fentanyl, methadone. **Benzodiazepine medications include: diazepam (Valium), alprazolam (Xanax), clonazepam (Klonopine), lorazepam (Ativan),  clorazepate (Tranxene), chlordiazepoxide (Librium), estazolam (Prosom), oxazepam (Serax), temazepam (Restoril), triazolam (Halcion) (Last updated: 08/07/2017) ____________________________________________________________________________________________

## 2017-12-10 NOTE — Patient Instructions (Signed)
____________________________________________________________________________________________  Medication Rules  Applies to: All patients receiving prescriptions (written or electronic).  Pharmacy of record: Pharmacy where electronic prescriptions will be sent. If written prescriptions are taken to a different pharmacy, please inform the nursing staff. The pharmacy listed in the electronic medical record should be the one where you would like electronic prescriptions to be sent.  Prescription refills: Only during scheduled appointments. Applies to both, written and electronic prescriptions.  NOTE: The following applies primarily to controlled substances (Opioid* Pain Medications).   Patient's responsibilities: 1. Pain Pills: Bring all pain pills to every appointment (except for procedure appointments). 2. Pill Bottles: Bring pills in original pharmacy bottle. Always bring newest bottle. Bring bottle, even if empty. 3. Medication refills: You are responsible for knowing and keeping track of what medications you need refilled. The day before your appointment, write a list of all prescriptions that need to be refilled. Bring that list to your appointment and give it to the admitting nurse. Prescriptions will be written only during appointments. If you forget a medication, it will not be "Called in", "Faxed", or "electronically sent". You will need to get another appointment to get these prescribed. 4. Prescription Accuracy: You are responsible for carefully inspecting your prescriptions before leaving our office. Have the discharge nurse carefully go over each prescription with you, before taking them home. Make sure that your name is accurately spelled, that your address is correct. Check the name and dose of your medication to make sure it is accurate. Check the number of pills, and the written instructions to make sure they are clear and accurate. Make sure that you are given enough medication to last  until your next medication refill appointment. 5. Taking Medication: Take medication as prescribed. Never take more pills than instructed. Never take medication more frequently than prescribed. Taking less pills or less frequently is permitted and encouraged, when it comes to controlled substances (written prescriptions).  6. Inform other Doctors: Always inform, all of your healthcare providers, of all the medications you take. 7. Pain Medication from other Providers: You are not allowed to accept any additional pain medication from any other Doctor or Healthcare provider. There are two exceptions to this rule. (see below) In the event that you require additional pain medication, you are responsible for notifying us, as stated below. 8. Medication Agreement: You are responsible for carefully reading and following our Medication Agreement. This must be signed before receiving any prescriptions from our practice. Safely store a copy of your signed Agreement. Violations to the Agreement will result in no further prescriptions. (Additional copies of our Medication Agreement are available upon request.) 9. Laws, Rules, & Regulations: All patients are expected to follow all Federal and State Laws, Statutes, Rules, & Regulations. Ignorance of the Laws does not constitute a valid excuse. The use of any illegal substances is prohibited. 10. Adopted CDC guidelines & recommendations: Target dosing levels will be at or below 60 MME/day. Use of benzodiazepines** is not recommended.  Exceptions: There are only two exceptions to the rule of not receiving pain medications from other Healthcare Providers. 1. Exception #1 (Emergencies): In the event of an emergency (i.e.: accident requiring emergency care), you are allowed to receive additional pain medication. However, you are responsible for: As soon as you are able, call our office [phone], at any time of the day or night, and leave a message stating your name, the  date and nature of the emergency, and the name and dose of the medication   prescribed. In the event that your call is answered by a member of our staff, make sure to document and save the date, time, and the name of the person that took your information.  2. Exception #2 (Planned Surgery): In the event that you are scheduled by another doctor or dentist to have any type of surgery or procedure, you are allowed (for a period no longer than 30 days), to receive additional pain medication, for the acute post-op pain. However, in this case, you are responsible for picking up a copy of our "Post-op Pain Management for Surgeons" handout, and giving it to your surgeon or dentist. This document is available at our office, and does not require an appointment to obtain it. Simply go to our office during business hours (Monday-Thursday from 8:00 AM to 4:00 PM) (Friday 8:00 AM to 12:00 Noon) or if you have a scheduled appointment with us, prior to your surgery, and ask for it by name. In addition, you will need to provide us with your name, name of your surgeon, type of surgery, and date of procedure or surgery.  *Opioid medications include: morphine, codeine, oxycodone, oxymorphone, hydrocodone, hydromorphone, meperidine, tramadol, tapentadol, buprenorphine, fentanyl, methadone. **Benzodiazepine medications include: diazepam (Valium), alprazolam (Xanax), clonazepam (Klonopine), lorazepam (Ativan), clorazepate (Tranxene), chlordiazepoxide (Librium), estazolam (Prosom), oxazepam (Serax), temazepam (Restoril), triazolam (Halcion) (Last updated: 08/07/2017) ____________________________________________________________________________________________    

## 2017-12-10 NOTE — Progress Notes (Signed)
Nursing Pain Medication Assessment:  Safety precautions to be maintained throughout the outpatient stay will include: orient to surroundings, keep bed in low position, maintain call bell within reach at all times, provide assistance with transfer out of bed and ambulation.  Medication Inspection Compliance: Pill count conducted under aseptic conditions, in front of the patient. Neither the pills nor the bottle was removed from the patient's sight at any time. Once count was completed pills were immediately returned to the patient in their original bottle.  Medication: Oxycodone/APAP Pill/Patch Count: 70.5 of 120 pills remain Pill/Patch Appearance: Markings consistent with prescribed medication Bottle Appearance: Standard pharmacy container. Clearly labeled. Filled Date: 06 / 21 / 2019 Last Medication intake:  Today

## 2017-12-16 LAB — TOXASSURE SELECT 13 (MW), URINE

## 2017-12-21 DIAGNOSIS — N281 Cyst of kidney, acquired: Secondary | ICD-10-CM

## 2017-12-21 HISTORY — DX: Cyst of kidney, acquired: N28.1

## 2017-12-22 ENCOUNTER — Ambulatory Visit
Admission: RE | Admit: 2017-12-22 | Discharge: 2017-12-22 | Disposition: A | Discharge status: Home / Self Care | Payer: BLUE CROSS/BLUE SHIELD | Source: Ambulatory Visit | Attending: Pain Medicine | Admitting: Pain Medicine

## 2017-12-22 DIAGNOSIS — M5442 Lumbago with sciatica, left side: Secondary | ICD-10-CM

## 2017-12-22 DIAGNOSIS — Z981 Arthrodesis status: Secondary | ICD-10-CM | POA: Insufficient documentation

## 2017-12-22 DIAGNOSIS — M961 Postlaminectomy syndrome, not elsewhere classified: Secondary | ICD-10-CM | POA: Diagnosis not present

## 2017-12-22 DIAGNOSIS — M48061 Spinal stenosis, lumbar region without neurogenic claudication: Secondary | ICD-10-CM | POA: Diagnosis not present

## 2017-12-22 DIAGNOSIS — G8929 Other chronic pain: Secondary | ICD-10-CM | POA: Diagnosis not present

## 2017-12-22 DIAGNOSIS — M5116 Intervertebral disc disorders with radiculopathy, lumbar region: Secondary | ICD-10-CM | POA: Insufficient documentation

## 2017-12-22 DIAGNOSIS — M5126 Other intervertebral disc displacement, lumbar region: Secondary | ICD-10-CM

## 2017-12-22 DIAGNOSIS — M5136 Other intervertebral disc degeneration, lumbar region: Secondary | ICD-10-CM

## 2017-12-29 ENCOUNTER — Encounter: Payer: Self-pay | Admitting: Pain Medicine

## 2017-12-29 ENCOUNTER — Ambulatory Visit: Payer: BLUE CROSS/BLUE SHIELD | Attending: Pain Medicine | Admitting: Pain Medicine

## 2017-12-29 VITALS — BP 131/89 | HR 83 | Temp 98.2°F | Resp 16 | Ht 66.0 in | Wt 180.0 lb

## 2017-12-29 DIAGNOSIS — Z885 Allergy status to narcotic agent status: Secondary | ICD-10-CM | POA: Diagnosis not present

## 2017-12-29 DIAGNOSIS — M47817 Spondylosis without myelopathy or radiculopathy, lumbosacral region: Secondary | ICD-10-CM | POA: Diagnosis not present

## 2017-12-29 DIAGNOSIS — F4321 Adjustment disorder with depressed mood: Secondary | ICD-10-CM | POA: Diagnosis not present

## 2017-12-29 DIAGNOSIS — M961 Postlaminectomy syndrome, not elsewhere classified: Secondary | ICD-10-CM | POA: Diagnosis not present

## 2017-12-29 DIAGNOSIS — M5126 Other intervertebral disc displacement, lumbar region: Secondary | ICD-10-CM | POA: Insufficient documentation

## 2017-12-29 DIAGNOSIS — Z79891 Long term (current) use of opiate analgesic: Secondary | ICD-10-CM | POA: Insufficient documentation

## 2017-12-29 DIAGNOSIS — M5442 Lumbago with sciatica, left side: Secondary | ICD-10-CM | POA: Diagnosis not present

## 2017-12-29 DIAGNOSIS — M7918 Myalgia, other site: Secondary | ICD-10-CM | POA: Insufficient documentation

## 2017-12-29 DIAGNOSIS — G894 Chronic pain syndrome: Secondary | ICD-10-CM

## 2017-12-29 DIAGNOSIS — Z79899 Other long term (current) drug therapy: Secondary | ICD-10-CM | POA: Insufficient documentation

## 2017-12-29 DIAGNOSIS — M5136 Other intervertebral disc degeneration, lumbar region with discogenic back pain only: Secondary | ICD-10-CM

## 2017-12-29 DIAGNOSIS — M533 Sacrococcygeal disorders, not elsewhere classified: Secondary | ICD-10-CM | POA: Insufficient documentation

## 2017-12-29 DIAGNOSIS — M25552 Pain in left hip: Secondary | ICD-10-CM | POA: Diagnosis not present

## 2017-12-29 DIAGNOSIS — E559 Vitamin D deficiency, unspecified: Secondary | ICD-10-CM | POA: Insufficient documentation

## 2017-12-29 DIAGNOSIS — G8929 Other chronic pain: Secondary | ICD-10-CM

## 2017-12-29 DIAGNOSIS — M545 Low back pain: Secondary | ICD-10-CM | POA: Insufficient documentation

## 2017-12-29 DIAGNOSIS — Z981 Arthrodesis status: Secondary | ICD-10-CM | POA: Insufficient documentation

## 2017-12-29 DIAGNOSIS — M48061 Spinal stenosis, lumbar region without neurogenic claudication: Secondary | ICD-10-CM | POA: Diagnosis not present

## 2017-12-29 DIAGNOSIS — F17210 Nicotine dependence, cigarettes, uncomplicated: Secondary | ICD-10-CM | POA: Diagnosis not present

## 2017-12-29 DIAGNOSIS — G8918 Other acute postprocedural pain: Secondary | ICD-10-CM | POA: Insufficient documentation

## 2017-12-29 DIAGNOSIS — M549 Dorsalgia, unspecified: Secondary | ICD-10-CM | POA: Insufficient documentation

## 2017-12-29 NOTE — Progress Notes (Signed)
Patient's Name: [name]  MRN: [id number]  Referring Provider: Arnetha Courser, MD  DOB: 04-Nov-1970  PCP: Arnetha Courser, MD  DOS: 12/29/2017  Note by: Gaspar Cola, MD  Service setting: Ambulatory outpatient  Specialty: Interventional Pain Management  Location: [name] (AMB) Pain Management Facility    Patient type: Established   Primary Reason(s) for Visit: Evaluation of chronic illnesses with exacerbation, or progression (Level of risk: moderate) CC: Back Pain (lower bilateral )  HPI  [name] is a 47 y.o. year old, female patient, who comes today for a follow-up evaluation. She has Adjustment disorder with depressed mood; Stressful life events affecting family and household; Vitamin D deficiency; Lumbosacral spondylosis without myelopathy; Lumbar central spinal stenosis (L4-5); Breast cancer screening; Bladder irritation; Chronic low back pain (Primary Source of Pain) (midline); Arthralgia of multiple joints; Chronic, continuous use of opioids; Elevated rheumatoid factor; Chronic pain syndrome; Long term (current) use of opiate analgesic; Long term prescription opiate use; Opiate use (85.5 MME/Day); Chronic hip pain (Secondary source of pain) (Left); Failed back surgical syndrome; Discogenic low back pain; Lumbar discogenic pain syndrome; Chronic sacroiliac joint pain (Left); Chronic musculoskeletal pain; Lumbar facet syndrome (Bilateral); Lumbar spondylosis; Neurogenic pain; Depression; Acute postoperative pain; Trigger point with back pain (Left); Tobacco use; and Preventative health care on their problem list. [name] was last seen on 12/01/2017. Her primarily concern today is the Back Pain (lower bilateral )  Pain Assessment: Location: Lower, Left, Right Back Radiating: denies  Onset: More than a month ago Duration: Chronic pain Quality: Discomfort, Dull, Aching, Constant Severity: 3 /10 (subjective, self-reported pain score)  Note: Reported level is compatible with observation.                          When using our objective Pain Scale, levels between 6 and 10/10 are said to belong in an emergency room, as it progressively worsens from a 6/10, described as severely limiting, requiring emergency care not usually available at an outpatient pain management facility. At a 6/10 level, communication becomes difficult and requires great effort. Assistance to reach the emergency department may be required. Facial flushing and profuse sweating along with potentially dangerous increases in heart rate and blood pressure will be evident. Effect on ADL: difficulty standing or sitting for long periods of time.  Timing: Constant Modifying factors: heating pad, hot towels, frequent position changes  BP: 131/89  HR: 83  Today we went over the results of the MRI, which I explained to the patient in layman's terms. We also talked about some alternatives to treat this pain and she has decided to do a spinal course later trial. Her primary pain is in the middle of back and I have explained to the patient that there is no way that we can cover that area without also providing some stimulation to the lower extremities. She understood and accepted. To comply with insurance regulations, we will be sending her to have a medical psychology evaluation to see if there are any contraindications to the implant trial. Once she has had that evaluation, if there are no problems, then we will proceed with the trial. The patient was instructed to give [name] a call to come in sooner if she has any questions. She understood and accepted.  Further details on both, my assessment(s), as well as the proposed treatment plan, please see below.  Laboratory Chemistry  Inflammation Markers (CRP: Acute Phase) (ESR: Chronic Phase) Lab Results  Component Value Date   CRP 5.3 (H) 12/05/2016   ESRSEDRATE 9 12/05/2016                         Rheumatology Markers Lab Results  Component Value Date   RF 17.9 (H) 12/05/2016   ANA  NEG 10/02/2016   LABURIC 2.5 09/18/2016                        Renal Function Markers Lab Results  Component Value Date   BUN 17 12/05/2016   CREATININE 0.84 12/05/2016   BCR 20 12/05/2016   GFRAA 96 12/05/2016   GFRNONAA 83 12/05/2016                             Hepatic Function Markers Lab Results  Component Value Date   AST 21 12/05/2016   ALT 20 12/05/2016   ALBUMIN 4.4 12/05/2016   ALKPHOS 59 12/05/2016   LIPASE 107 12/26/2011                        Electrolytes Lab Results  Component Value Date   NA 139 12/05/2016   K 4.6 12/05/2016   CL 103 12/05/2016   CALCIUM 9.5 12/05/2016   MG 2.1 12/05/2016   PHOS 3.0 09/18/2016                        Neuropathy Markers Lab Results  Component Value Date   VITAMINB12 495 12/05/2016                        Bone Pathology Markers Lab Results  Component Value Date   VD25OH 27.1 (L) 09/18/2016   25OHVITD1 49 12/05/2016   25OHVITD2 <1.0 12/05/2016   25OHVITD3 49 12/05/2016                         Coagulation Parameters Lab Results  Component Value Date   INR 0.9 04/16/2013   LABPROT 12.4 04/16/2013   PLT 287 10/02/2016                        Cardiovascular Markers Lab Results  Component Value Date   CKTOTAL 75 04/16/2013   CKMB 0.6 04/16/2013   TROPONINI < 0.02 04/16/2013   HGB 13.3 10/02/2016   HCT 40.8 10/02/2016                         CA Markers No results found.  Note: Lab results reviewed.  Recent Diagnostic Imaging Review  Lumbosacral Imaging: Lumbar MR wo contrast:  Results for orders placed during the hospital encounter of 12/22/17  MR LUMBAR SPINE WO CONTRAST   Narrative CLINICAL DATA:  Initial evaluation for chronic low back pain, history of prior fusion in 2017.  EXAM: MRI LUMBAR SPINE WITHOUT CONTRAST  TECHNIQUE: Multiplanar, multisequence MR imaging of the lumbar spine was performed. No intravenous contrast was administered.  COMPARISON:  Prior MRI from  10/21/2005.  FINDINGS: Segmentation: Normal segmentation. Lowest well-formed disc labeled the L5-S1 level.  Alignment: Mild levoscoliosis. Vertebral bodies otherwise normally aligned with preservation of the normal lumbar lordosis. No listhesis.  Vertebrae: Vertebral body heights maintained without evidence for acute or chronic fracture. Bone marrow signal intensity within normal limits. No  discrete or worrisome osseous lesions. No abnormal marrow edema. Susceptibility artifact from prior PLIF present at the L4-5 level.  Conus medullaris and cauda equina: Conus extends to the T12-L1 level. Conus and cauda equina appear normal.  Paraspinal and other soft tissues: Paraspinous soft tissues within normal limits. 9 mm cyst noted at the posterior right kidney. Visualized visceral structures otherwise unremarkable.  Disc levels:  T11-12: Unremarkable.  T12-L1: Unremarkable.  L1-2: Unremarkable.  L2-3: Mild diffuse disc bulge with disc desiccation. Disc bulging asymmetric to the left without focal disc protrusion. Mild facet hypertrophy. No significant canal stenosis. Mild left L2 foraminal narrowing.  L3-4: Mild diffuse disc bulge with disc desiccation. Superimposed shallow right foraminal disc protrusion contacts the exiting right L3 nerve root (series 6, image 26). Borderline mild canal and bilateral lateral recess stenosis. Foramina remain patent.  L4-5: Postoperative changes from prior posterior and interbody fusion with decompressive left hemi laminectomy. No residual canal or lateral recess stenosis. Foramina are patent.  L5-S1:  Unremarkable.  IMPRESSION: 1. Mild left eccentric disc bulge at L2-3 with resultant mild left L2 foraminal stenosis. 2. Shallow right foraminal disc protrusion at L3-4, contacting and potentially affecting the exiting right L3 nerve root. 3. Sequelae of prior PLIF at L4-5 without residual or recurrent stenosis.   Electronically Signed    By: Jeannine Boga M.D.   On: 12/22/2017 22:10    Lumbar DG 2-3 views:  Results for orders placed during the hospital encounter of 03/25/11  DG Lumbar Spine 2-3 Views Ordered by RMM   Narrative *RADIOLOGY REPORT*  Clinical Data: L5-S1 laminectomy and microdiskectomy.  Reported transitional vertebral anatomy with six lumbar type vertebrae on prior outside imaging.  LUMBAR SPINE - 2-3 VIEW  Comparison: No similar prior study is available for comparison.  Findings: The most inferior lumbar type vertebral body is labeled L6, as per provided clinical history.  The most superior lumbar type vertebral body is not included in the field of view.  On the initial image obtained at 7:13 p.m., a radiopaque marker projects posterior to the spinous process of L4.  On a second image obtained at 7:35 p.m., radiopaque markers are present over the spinous processes of the L5 and L6 vertebral body levels, respectively.  IMPRESSION: Intraoperative imaging as above.  Specifically, the inferior most lumbar type vertebral body is labeled L6 as per the provided history, as no similar prior imaging is available at this institution for comparison or verification.  Original Report Authenticated By: Arline Asp, M.D.   Sacroiliac Joint Imaging: Sacroiliac Joint DG:  Results for orders placed during the hospital encounter of 12/05/16  DG Si Joints   Narrative CLINICAL DATA:  Chronic pain radiating to LEFT hip, back surgery in April 2017  EXAM: BILATERAL SACROILIAC JOINTS - 3+ VIEW  COMPARISON:  None  FINDINGS: SI joints symmetric and preserved.  Prior posterior fusion L4-L5 with BILATERAL pedicle screws/bars and intervening disc prosthesis.  Sacrum unremarkable.  Visualized pelvis intact.  IMPRESSION: No acute abnormalities.  Prior L4-L5 fusion.   Electronically Signed   By: Lavonia Dana M.D.   On: 12/05/2016 14:29    Hip Imaging: Hip-L DG 2-3 views:  Results for  orders placed during the hospital encounter of 12/05/16  DG HIP UNILAT W OR W/O PELVIS 2-3 VIEWS LEFT   Narrative CLINICAL DATA:  Chronic pain radiating to LEFT hip for couple weeks, no injury, back surgery in April 2017  EXAM: DG HIP (WITH OR WITHOUT PELVIS) 2-3V LEFT  COMPARISON:  None  FINDINGS: Osseous mineralization grossly normal for technique.  Hip and SI joint spaces preserved.  No acute fracture, dislocation, or bone destruction.  Prior L4-L5 posterior fusion with intervening disc prosthesis.  IMPRESSION: No acute osseous abnormalities.  Prior L4-L5 posterior fusion.   Electronically Signed   By: Lavonia Dana M.D.   On: 12/05/2016 14:28    Complexity Note: Imaging results reviewed. Results shared with Ms. Kitzmiller, using Layman's terms.                         Meds   Current Outpatient Medications:  .  cyclobenzaprine (FLEXERIL) 10 MG tablet, Take 1 tablet (10 mg total) by mouth at bedtime., Disp: 30 tablet, Rfl: 0 .  DULoxetine (CYMBALTA) 30 MG capsule, Take 1 capsule (30 mg total) by mouth 2 (two) times daily., Disp: 60 capsule, Rfl: 0 .  meloxicam (MOBIC) 15 MG tablet, Take 1 tablet (15 mg total) by mouth daily., Disp: 30 tablet, Rfl: 0 .  oxyCODONE-acetaminophen (PERCOCET) 10-325 MG tablet, Take 1 tablet by mouth every 6 (six) hours as needed for pain., Disp: 120 tablet, Rfl: 0  ROS  Constitutional: Denies any fever or chills Gastrointestinal: No reported hemesis, hematochezia, vomiting, or acute GI distress Musculoskeletal: Denies any acute onset joint swelling, redness, loss of ROM, or weakness Neurological: No reported episodes of acute onset apraxia, aphasia, dysarthria, agnosia, amnesia, paralysis, loss of coordination, or loss of consciousness  Allergies  Ms. Brickle is allergic to codeine.  PFSH  Drug: Ms. Delao  reports that she does not use drugs. Alcohol:  reports that she does not drink alcohol. Tobacco:  reports that she has been smoking cigarettes.   She has a 20.00 pack-year smoking history. She has never used smokeless tobacco. Medical:  has a past medical history of Allergy, Anxiety, Chronic bilateral low back pain (07/22/2016), and Depression. Surgical: Ms. Pincock  has a past surgical history that includes Abdominal hysterectomy; Tonsillectomy; Cesarean section; Spine surgery; Spine surgery (09/09/2015); and Back surgery. Family: family history includes Arthritis in her father; Breast cancer (age of onset: 36) in her maternal aunt; Cancer in her maternal aunt; Depression in her sister and sister; Diabetes in her mother; Heart disease in her father and maternal grandfather; Hyperlipidemia in her father; Hypertension in her father and mother; Lung disease in her father; Lymphoma in her father.  Constitutional Exam  General appearance: Well nourished, well developed, and well hydrated. In no apparent acute distress Vitals:   12/29/17 1128  BP: 131/89  Pulse: 83  Resp: 16  Temp: 98.2 F (36.8 C)  TempSrc: Oral  SpO2: 97%  Weight: 180 lb (81.6 kg)  Height: _0  (1.676 m)   BMI Assessment: Estimated body mass index is 29.05 kg/m as calculated from the following:   Height as of this encounter: _1  (1.676 m).   Weight as of this encounter: 180 lb (81.6 kg).  BMI interpretation table: BMI level Category Range association with higher incidence of chronic pain  <18 kg/m2 Underweight   18.5-24.9 kg/m2 Ideal body weight   25-29.9 kg/m2 Overweight Increased incidence by 20%  30-34.9 kg/m2 Obese (Class I) Increased incidence by 68%  35-39.9 kg/m2 Severe obesity (Class II) Increased incidence by 136%  >40 kg/m2 Extreme obesity (Class III) Increased incidence by 254%   Patient's current BMI Ideal Body weight  Body mass index is 29.05 kg/m. Ideal body weight: 59.3 kg (130 lb 11.7 oz) Adjusted ideal body weight: 68.2 kg (  150 lb 7 oz)   BMI Readings from Last 4 Encounters:  12/29/17 29.05 kg/m  12/10/17 29.05 kg/m  12/01/17 29.05 kg/m   11/10/17 29.05 kg/m   Wt Readings from Last 4 Encounters:  12/29/17 180 lb (81.6 kg)  12/10/17 180 lb (81.6 kg)  12/01/17 180 lb (81.6 kg)  11/10/17 180 lb (81.6 kg)  Psych/Mental status: Alert, oriented x 3 (person, place, & time)       Eyes: PERLA Respiratory: No evidence of acute respiratory distress  Cervical Spine Area Exam  Skin & Axial Inspection: No masses, redness, edema, swelling, or associated skin lesions Alignment: Symmetrical Functional ROM: Unrestricted ROM      Stability: No instability detected Muscle Tone/Strength: Functionally intact. No obvious neuro-muscular anomalies detected. Sensory (Neurological): Unimpaired Palpation: No palpable anomalies              Upper Extremity (UE) Exam    Side: Right upper extremity  Side: Left upper extremity  Skin & Extremity Inspection: Skin color, temperature, and hair growth are WNL. No peripheral edema or cyanosis. No masses, redness, swelling, asymmetry, or associated skin lesions. No contractures.  Skin & Extremity Inspection: Skin color, temperature, and hair growth are WNL. No peripheral edema or cyanosis. No masses, redness, swelling, asymmetry, or associated skin lesions. No contractures.  Functional ROM: Unrestricted ROM          Functional ROM: Unrestricted ROM          Muscle Tone/Strength: Functionally intact. No obvious neuro-muscular anomalies detected.  Muscle Tone/Strength: Functionally intact. No obvious neuro-muscular anomalies detected.  Sensory (Neurological): Unimpaired          Sensory (Neurological): Unimpaired          Palpation: No palpable anomalies              Palpation: No palpable anomalies              Provocative Test(s):  Phalen's test: deferred Tinel's test: deferred Apley's scratch test (touch opposite shoulder):  Action 1 (Across chest): deferred Action 2 (Overhead): deferred Action 3 (LB reach): deferred   Provocative Test(s):  Phalen's test: deferred Tinel's test: deferred Apley's  scratch test (touch opposite shoulder):  Action 1 (Across chest): deferred Action 2 (Overhead): deferred Action 3 (LB reach): deferred    Thoracic Spine Area Exam  Skin & Axial Inspection: No masses, redness, or swelling Alignment: Symmetrical Functional ROM: Unrestricted ROM Stability: No instability detected Muscle Tone/Strength: Functionally intact. No obvious neuro-muscular anomalies detected. Sensory (Neurological): Unimpaired Muscle strength & Tone: No palpable anomalies  Lumbar Spine Area Exam  Skin & Axial Inspection: No masses, redness, or swelling Alignment: Symmetrical Functional ROM: Unrestricted ROM       Stability: No instability detected Muscle Tone/Strength: Functionally intact. No obvious neuro-muscular anomalies detected. Sensory (Neurological): Unimpaired Palpation: No palpable anomalies       Provocative Tests: Lumbar Hyperextension/rotation test: deferred today       Lumbar quadrant test (Kemp's test): deferred today       Lumbar Lateral bending test: deferred today       Patrick's Maneuver: deferred today                   FABER test: deferred today                   Thigh-thrust test: deferred today       S-I compression test: deferred today       S-I distraction test: deferred today  Gait & Posture Assessment  Ambulation: Unassisted Gait: Relatively normal for age and body habitus Posture: WNL   Lower Extremity Exam    Side: Right lower extremity  Side: Left lower extremity  Stability: No instability observed          Stability: No instability observed          Skin & Extremity Inspection: Skin color, temperature, and hair growth are WNL. No peripheral edema or cyanosis. No masses, redness, swelling, asymmetry, or associated skin lesions. No contractures.  Skin & Extremity Inspection: Skin color, temperature, and hair growth are WNL. No peripheral edema or cyanosis. No masses, redness, swelling, asymmetry, or associated skin lesions. No  contractures.  Functional ROM: Unrestricted ROM                  Functional ROM: Unrestricted ROM                  Muscle Tone/Strength: Functionally intact. No obvious neuro-muscular anomalies detected.  Muscle Tone/Strength: Functionally intact. No obvious neuro-muscular anomalies detected.  Sensory (Neurological): Unimpaired  Sensory (Neurological): Unimpaired  Palpation: No palpable anomalies  Palpation: No palpable anomalies   Assessment  Primary Diagnosis & Pertinent Problem List: The primary encounter diagnosis was Chronic low back pain (Primary Source of Pain) (midline). Diagnoses of Chronic hip pain (Secondary source of pain) (Left), Failed back surgical syndrome, Lumbar discogenic pain syndrome, and Chronic pain syndrome were also pertinent to this visit.  Status Diagnosis  Controlled Controlled Controlled 1. Chronic low back pain (Primary Source of Pain) (midline)   2. Chronic hip pain (Secondary source of pain) (Left)   3. Failed back surgical syndrome   4. Lumbar discogenic pain syndrome   5. Chronic pain syndrome     Problems updated and reviewed during this visit: No problems updated. Plan of Care  Pharmacotherapy (Medications Ordered): No orders of the defined types were placed in this encounter.  Medications administered today: Butch Penny. [place] had no medications administered during this visit.   Procedure Orders     [place] TRIAL Lab Orders  No laboratory test(s) ordered today   Imaging Orders  No imaging studies ordered today    Referral Orders     Ambulatory referral to Psychology Interventional management options: Planned, scheduled, and/or pending:   Lumbar SCS trial    Considering:   Diagnostic bilateral lumbar facet block(NO STEROIDS) Possible bilateral lumbar facet RFA(right side done on 06/26/2017; left side on 05/06/2017) Diagnostic left sided caudal epidural steroid injection + diagnostic epidurogram Possible left-sided Racz  procedure Diagnostic left L4-5 interlaminar lumbar epidural steroid injection Diagnosticleft L4-5 transforaminal epidural steroid injection Diagnostic left L4 selective nerve root block Possible left L4 nerve root ganglion RFA Possible lumbar spinal cord stimulator trial Diagnostic Lumbar provocative discogram  Possible IDET procedure    Palliative PRN treatment(s):   Diagnostic/palliative bilateral lumbar facet block(NO STEROIDS)# 3    Provider-requested follow-up: Return for after Medical Psychology consult for SCS trial..  Future Appointments  Date Time Provider [place]  01/05/2018 11:30 AM Vevelyn Francois, NP [facility] None   Primary Care Physician: Arnetha Courser, MD Location: [facility] Outpatient Pain Management Facility Note by: Gaspar Cola, MD Date: 12/29/2017; Time: 12:57 PM

## 2017-12-29 NOTE — Patient Instructions (Signed)
____________________________________________________________________________________________  Preparing for Procedure with Sedation  Instructions: . Oral Intake: Do not eat or drink anything for at least 8 hours prior to your procedure. . Transportation: Public transportation is not allowed. Bring an adult driver. The driver must be physically present in our waiting room before any procedure can be started. . Physical Assistance: Bring an adult physically capable of assisting you, in the event you need help. This adult should keep you company at home for at least 6 hours after the procedure. . Blood Pressure Medicine: Take your blood pressure medicine with a sip of water the morning of the procedure. . Blood thinners: Notify our staff if you are taking any blood thinners. Depending on which one you take, there will be specific instructions on how and when to stop it. . Diabetics on insulin: Notify the staff so that you can be scheduled 1st case in the morning. If your diabetes requires high dose insulin, take only  of your normal insulin dose the morning of the procedure and notify the staff that you have done so. . Preventing infections: Shower with an antibacterial soap the morning of your procedure. . Build-up your immune system: Take 1000 mg of Vitamin C with every meal (3 times a day) the day prior to your procedure. . Antibiotics: Inform the staff if you have a condition or reason that requires you to take antibiotics before dental procedures. . Pregnancy: If you are pregnant, call and cancel the procedure. . Sickness: If you have a cold, fever, or any active infections, call and cancel the procedure. . Arrival: You must be in the facility at least 30 minutes prior to your scheduled procedure. . Children: Do not bring children with you. . Dress appropriately: Bring dark clothing that you would not mind if they get stained. . Valuables: Do not bring any jewelry or valuables.  Procedure  appointments are reserved for interventional treatments only. . No Prescription Refills. . No medication changes will be discussed during procedure appointments. . No disability issues will be discussed.  Reasons to call and reschedule or cancel your procedure: (Following these recommendations will minimize the risk of a serious complication.) . Surgeries: Avoid having procedures within 2 weeks of any surgery. (Avoid for 2 weeks before or after any surgery). . Flu Shots: Avoid having procedures within 2 weeks of a flu shots or . (Avoid for 2 weeks before or after immunizations). . Barium: Avoid having a procedure within 7-10 days after having had a radiological study involving the use of radiological contrast. (Myelograms, Barium swallow or enema study). . Heart attacks: Avoid any elective procedures or surgeries for the initial 6 months after a "Myocardial Infarction" (Heart Attack). . Blood thinners: It is imperative that you stop these medications before procedures. Let us know if you if you take any blood thinner.  . Infection: Avoid procedures during or within two weeks of an infection (including chest colds or gastrointestinal problems). Symptoms associated with infections include: Localized redness, fever, chills, night sweats or profuse sweating, burning sensation when voiding, cough, congestion, stuffiness, runny nose, sore throat, diarrhea, nausea, vomiting, cold or Flu symptoms, recent or current infections. It is specially important if the infection is over the area that we intend to treat. . Heart and lung problems: Symptoms that may suggest an active cardiopulmonary problem include: cough, chest pain, breathing difficulties or shortness of breath, dizziness, ankle swelling, uncontrolled high or unusually low blood pressure, and/or palpitations. If you are experiencing any of these symptoms, cancel   your procedure and contact your primary care physician for an evaluation.  Remember:   Regular Business hours are:  Monday to Thursday 8:00 AM to 4:00 PM  Provider's Schedule: Micaylah Bertucci, MD:  Procedure days: Tuesday and Thursday 7:30 AM to 4:00 PM  Bilal Lateef, MD:  Procedure days: Monday and Wednesday 7:30 AM to 4:00 PM ____________________________________________________________________________________________    

## 2018-01-05 ENCOUNTER — Ambulatory Visit: Payer: BLUE CROSS/BLUE SHIELD | Attending: Nurse Practitioner | Admitting: Nurse Practitioner

## 2018-01-05 ENCOUNTER — Encounter: Payer: Self-pay | Admitting: Nurse Practitioner

## 2018-01-05 ENCOUNTER — Other Ambulatory Visit: Payer: Self-pay

## 2018-01-05 DIAGNOSIS — Z1239 Encounter for other screening for malignant neoplasm of breast: Secondary | ICD-10-CM | POA: Insufficient documentation

## 2018-01-05 DIAGNOSIS — Z836 Family history of other diseases of the respiratory system: Secondary | ICD-10-CM | POA: Insufficient documentation

## 2018-01-05 DIAGNOSIS — M47817 Spondylosis without myelopathy or radiculopathy, lumbosacral region: Secondary | ICD-10-CM | POA: Insufficient documentation

## 2018-01-05 DIAGNOSIS — M549 Dorsalgia, unspecified: Secondary | ICD-10-CM | POA: Diagnosis present

## 2018-01-05 DIAGNOSIS — Z5181 Encounter for therapeutic drug level monitoring: Secondary | ICD-10-CM | POA: Diagnosis present

## 2018-01-05 DIAGNOSIS — Z803 Family history of malignant neoplasm of breast: Secondary | ICD-10-CM | POA: Insufficient documentation

## 2018-01-05 DIAGNOSIS — G894 Chronic pain syndrome: Secondary | ICD-10-CM | POA: Diagnosis not present

## 2018-01-05 DIAGNOSIS — M48061 Spinal stenosis, lumbar region without neurogenic claudication: Secondary | ICD-10-CM | POA: Diagnosis not present

## 2018-01-05 DIAGNOSIS — Z8249 Family history of ischemic heart disease and other diseases of the circulatory system: Secondary | ICD-10-CM | POA: Diagnosis not present

## 2018-01-05 DIAGNOSIS — M5126 Other intervertebral disc displacement, lumbar region: Secondary | ICD-10-CM | POA: Diagnosis not present

## 2018-01-05 DIAGNOSIS — Z79899 Other long term (current) drug therapy: Secondary | ICD-10-CM | POA: Diagnosis not present

## 2018-01-05 DIAGNOSIS — Z8261 Family history of arthritis: Secondary | ICD-10-CM | POA: Diagnosis not present

## 2018-01-05 DIAGNOSIS — G8918 Other acute postprocedural pain: Secondary | ICD-10-CM | POA: Diagnosis not present

## 2018-01-05 DIAGNOSIS — M47816 Spondylosis without myelopathy or radiculopathy, lumbar region: Secondary | ICD-10-CM | POA: Insufficient documentation

## 2018-01-05 DIAGNOSIS — Z79891 Long term (current) use of opiate analgesic: Secondary | ICD-10-CM | POA: Insufficient documentation

## 2018-01-05 DIAGNOSIS — Z8349 Family history of other endocrine, nutritional and metabolic diseases: Secondary | ICD-10-CM | POA: Insufficient documentation

## 2018-01-05 DIAGNOSIS — M5442 Lumbago with sciatica, left side: Secondary | ICD-10-CM | POA: Diagnosis not present

## 2018-01-05 DIAGNOSIS — M7918 Myalgia, other site: Secondary | ICD-10-CM | POA: Insufficient documentation

## 2018-01-05 DIAGNOSIS — Z833 Family history of diabetes mellitus: Secondary | ICD-10-CM | POA: Insufficient documentation

## 2018-01-05 DIAGNOSIS — Z809 Family history of malignant neoplasm, unspecified: Secondary | ICD-10-CM | POA: Diagnosis not present

## 2018-01-05 DIAGNOSIS — E559 Vitamin D deficiency, unspecified: Secondary | ICD-10-CM | POA: Insufficient documentation

## 2018-01-05 DIAGNOSIS — Z818 Family history of other mental and behavioral disorders: Secondary | ICD-10-CM | POA: Insufficient documentation

## 2018-01-05 DIAGNOSIS — F4321 Adjustment disorder with depressed mood: Secondary | ICD-10-CM | POA: Insufficient documentation

## 2018-01-05 DIAGNOSIS — Z885 Allergy status to narcotic agent status: Secondary | ICD-10-CM | POA: Diagnosis not present

## 2018-01-05 DIAGNOSIS — Z807 Family history of other malignant neoplasms of lymphoid, hematopoietic and related tissues: Secondary | ICD-10-CM | POA: Diagnosis not present

## 2018-01-05 DIAGNOSIS — G8929 Other chronic pain: Secondary | ICD-10-CM

## 2018-01-05 MED ORDER — CYCLOBENZAPRINE HCL 10 MG PO TABS: 10.0000 mg | ORAL_TABLET | Freq: Every day | ORAL | 1 refills | Status: DC

## 2018-01-05 MED ORDER — OXYCODONE-ACETAMINOPHEN 10-325 MG PO TABS: 1.0000 | ORAL_TABLET | Freq: Four times a day (QID) | ORAL | 0 refills | Status: DC | PRN

## 2018-01-05 MED ORDER — DULOXETINE HCL 30 MG PO CPEP: 30.0000 mg | ORAL_CAPSULE | Freq: Two times a day (BID) | ORAL | 1 refills | Status: DC

## 2018-01-05 NOTE — Progress Notes (Signed)
Nursing Pain Medication Assessment:  Safety precautions to be maintained throughout the outpatient stay will include: orient to surroundings, keep bed in low position, maintain call bell within reach at all times, provide assistance with transfer out of bed and ambulation.  Medication Inspection Compliance: Pill count conducted under aseptic conditions, in front of the patient. Neither the pills nor the bottle was removed from the patient's sight at any time. Once count was completed pills were immediately returned to the patient in their original bottle.  Medication: Oxycodone/APAP Pill/Patch Count: 86 of 120 pills remain Pill/Patch Appearance: Markings consistent with prescribed medication Bottle Appearance: Standard pharmacy container. Clearly labeled. Filled Date: 7 / 21 / 2019 Last Medication intake:  Today

## 2018-01-05 NOTE — Progress Notes (Signed)
Patient's Name: [name]  MRN: [id number]  Referring Provider: Arnetha Courser, MD  DOB: [birth year]  PCP: Arnetha Courser, MD  DOS: 01/05/2018  Note by: Vevelyn Francois NP  Service setting: Ambulatory outpatient  Specialty: Interventional Pain Management  Location: [name] (AMB) Pain Management Facility    Patient type: Established    Primary Reason(s) for Visit: Encounter for prescription drug management. (Level of risk: moderate)  CC: Back Pain  HPI  Ms. Leisinger is a 47 y.o. year old, female patient, who comes today for a medication management evaluation. She has Adjustment disorder with depressed mood; Stressful life events affecting family and household; Vitamin D deficiency; Lumbosacral spondylosis without myelopathy; Lumbar central spinal stenosis (L4-5); Breast cancer screening; Bladder irritation; Chronic low back pain (Primary Source of Pain) (midline); Arthralgia of multiple joints; Chronic, continuous use of opioids; Elevated rheumatoid factor; Chronic pain syndrome; Long term (current) use of opiate analgesic; Long term prescription opiate use; Opiate use (85.5 MME/Day); Chronic hip pain (Secondary source of pain) (Left); Failed back surgical syndrome; Discogenic low back pain; Lumbar discogenic pain syndrome; Chronic sacroiliac joint pain (Left); Chronic musculoskeletal pain; Lumbar facet syndrome (Bilateral); Lumbar spondylosis; Neurogenic pain; Depression; Acute postoperative pain; Trigger point with back pain (Left); Tobacco use; and Preventative health care on their problem list. Her primarily concern today is the Back Pain  Pain Assessment: Location: Lower, Mid, Right, Left Back Radiating: Denies Onset: More than a month ago Duration: Chronic pain Quality: Dull, Aching, Constant, Discomfort Severity: 3 /10 (subjective, self-reported pain score)  Note: Reported level is compatible with observation.                          Effect on ADL: limits sleep and daily activities Timing:  Constant Modifying factors: heating pad, hot bath, no prolong walking or standing, medications BP: (!) 131/92  HR: 81  Ms. Kysar was last scheduled for an appointment on 12/10/2017 for medication management. During today's appointment we reviewed Ms. Lembcke's chronic pain status, as well as her outpatient medication regimen. She is concern that the SCS will not be effective for her. She admits that she does not have any pain. She admits that her pain is in her back. She admits that her previous surgery was not successful in stopping her pain.   The patient  reports that she does not use drugs. Her body mass index is 29.05 kg/m.  Further details on both, my assessment(s), as well as the proposed treatment plan, please see below.  Controlled Substance Pharmacotherapy Assessment REMS (Risk Evaluation and Mitigation Strategy)  Analgesic:Oxycodone/acetaminophenIR 10/366m every 6 hours (434mdayof oxycodone) (60MME/day) MME/day:604may.    BroChauncey FischerN  01/05/2018 12:02 PM  Sign at close encounter Nursing Pain Medication Assessment:  Safety precautions to be maintained throughout the outpatient stay will include: orient to surroundings, keep bed in low position, maintain call bell within reach at all times, provide assistance with transfer out of bed and ambulation.  Medication Inspection Compliance: Pill count conducted under aseptic conditions, in front of the patient. Neither the pills nor the bottle was removed from the patient's sight at any time. Once count was completed pills were immediately returned to the patient in their original bottle.  Medication: Oxycodone/APAP Pill/Patch Count: 86 of 120 pills remain Pill/Patch Appearance: Markings consistent with prescribed medication Bottle Appearance: Standard pharmacy container. Clearly labeled. Filled Date: 7 / 21 / 2019 Last Medication intake:  Today   Pharmacokinetics: Liberation  and absorption (onset of action):  WNL Distribution (time to peak effect): WNL Metabolism and excretion (duration of action): WNL         Pharmacodynamics: Desired effects: Analgesia: Ms. Simi reports >50% benefit. Functional ability: Patient reports that medication allows her to accomplish basic ADLs Clinically meaningful improvement in function (CMIF): Sustained CMIF goals met Perceived effectiveness: Described as relatively effective, allowing for increase in activities of daily living (ADL) Undesirable effects: Side-effects or Adverse reactions: None reported Monitoring: [place] PMP: Online review of the past 37-monthperiod conducted. Compliant with practice rules and regulations Last UDS on record: Summary  Date Value Ref Range Status  12/10/2017 FINAL  Final    Comment:    ==================================================================== TOXASSURE SELECT 13 (MW) ==================================================================== Test                             Result       Flag       Units Drug Present and Declared for Prescription Verification   Oxycodone                      2088         EXPECTED   ng/mg creat   Oxymorphone                    1319         EXPECTED   ng/mg creat   Noroxycodone                   4143         EXPECTED   ng/mg creat   Noroxymorphone                 300          EXPECTED   ng/mg creat    Sources of oxycodone are scheduled prescription medications.    Oxymorphone, noroxycodone, and noroxymorphone are expected    metabolites of oxycodone. Oxymorphone is also available as a    scheduled prescription medication. ==================================================================== Test                      Result    Flag   Units      Ref Range   Creatinine              215              mg/dL      >=20 ==================================================================== Declared Medications:  The flagging and interpretation on this report are based on the  following declared  medications.  Unexpected results may arise from  inaccuracies in the declared medications.  **Note: The testing scope of this panel includes these medications:  Oxycodone (Oxycodone Acetaminophen)  **Note: The testing scope of this panel does not include following  reported medications:  Acetaminophen (Oxycodone Acetaminophen)  Cyclobenzaprine  Duloxetine  Meloxicam ==================================================================== For clinical consultation, please call ([phone] ====================================================================    UDS interpretation: Compliant          Medication Assessment Form: Reviewed. Patient indicates being compliant with therapy Treatment compliance: Compliant Risk Assessment Profile: Aberrant behavior: See prior evaluations. None observed or detected today Comorbid factors increasing risk of overdose: See prior notes. No additional risks detected today Risk of substance use disorder (SUD): Low Opioid Risk Tool - 01/05/18 1209      Family History of Substance Abuse   Alcohol  Negative  Illegal Drugs  Negative    Rx Drugs  Negative      Personal History of Substance Abuse   Alcohol  Negative    Illegal Drugs  Negative    Rx Drugs  Negative      Total Score   Opioid Risk Tool Scoring  0    Opioid Risk Interpretation  Low Risk      ORT Scoring interpretation table:  Score <3 = Low Risk for SUD  Score between 4-7 = Moderate Risk for SUD  Score >8 = High Risk for Opioid Abuse   Risk Mitigation Strategies:  Patient Counseling: Covered Patient-Prescriber Agreement (PPA): Present and active  Notification to other healthcare providers: Done  Pharmacologic Plan: No change in therapy, at this time.             Laboratory Chemistry  Inflammation Markers (CRP: Acute Phase) (ESR: Chronic Phase) Lab Results  Component Value Date   CRP 5.3 (H) 12/05/2016   ESRSEDRATE 9 12/05/2016                         Rheumatology  Markers Lab Results  Component Value Date   RF 17.9 (H) 12/05/2016   ANA NEG 10/02/2016   LABURIC 2.5 09/18/2016                        Renal Function Markers Lab Results  Component Value Date   BUN 17 12/05/2016   CREATININE 0.84 12/05/2016   BCR 20 12/05/2016   GFRAA 96 12/05/2016   GFRNONAA 83 12/05/2016                             Hepatic Function Markers Lab Results  Component Value Date   AST 21 12/05/2016   ALT 20 12/05/2016   ALBUMIN 4.4 12/05/2016   ALKPHOS 59 12/05/2016   LIPASE 107 12/26/2011                        Electrolytes Lab Results  Component Value Date   NA 139 12/05/2016   K 4.6 12/05/2016   CL 103 12/05/2016   CALCIUM 9.5 12/05/2016   MG 2.1 12/05/2016   PHOS 3.0 09/18/2016                        Neuropathy Markers Lab Results  Component Value Date   VITAMINB12 495 12/05/2016                        Bone Pathology Markers Lab Results  Component Value Date   VD25OH 27.1 (L) 09/18/2016   25OHVITD1 49 12/05/2016   25OHVITD2 <1.0 12/05/2016   25OHVITD3 49 12/05/2016                         Coagulation Parameters Lab Results  Component Value Date   INR 0.9 04/16/2013   LABPROT 12.4 04/16/2013   PLT 287 10/02/2016                        Cardiovascular Markers Lab Results  Component Value Date   CKTOTAL 75 04/16/2013   CKMB 0.6 04/16/2013   TROPONINI < 0.02 04/16/2013   HGB 13.3 10/02/2016   HCT 40.8 10/02/2016  CA Markers No results found for: CEA, CA125, LABCA2                      Note: Lab results reviewed.  Recent Diagnostic Imaging Results  MR LUMBAR SPINE WO CONTRAST CLINICAL DATA:  Initial evaluation for chronic low back pain, history of prior fusion in 2017.  EXAM: MRI LUMBAR SPINE WITHOUT CONTRAST  TECHNIQUE: Multiplanar, multisequence MR imaging of the lumbar spine was performed. No intravenous contrast was administered.  COMPARISON:  Prior MRI from  10/21/2005.  FINDINGS: Segmentation: Normal segmentation. Lowest well-formed disc labeled the L5-S1 level.  Alignment: Mild levoscoliosis. Vertebral bodies otherwise normally aligned with preservation of the normal lumbar lordosis. No listhesis.  Vertebrae: Vertebral body heights maintained without evidence for acute or chronic fracture. Bone marrow signal intensity within normal limits. No discrete or worrisome osseous lesions. No abnormal marrow edema. Susceptibility artifact from prior PLIF present at the L4-5 level.  Conus medullaris and cauda equina: Conus extends to the T12-L1 level. Conus and cauda equina appear normal.  Paraspinal and other soft tissues: Paraspinous soft tissues within normal limits. 9 mm cyst noted at the posterior right kidney. Visualized visceral structures otherwise unremarkable.  Disc levels:  T11-12: Unremarkable.  T12-L1: Unremarkable.  L1-2: Unremarkable.  L2-3: Mild diffuse disc bulge with disc desiccation. Disc bulging asymmetric to the left without focal disc protrusion. Mild facet hypertrophy. No significant canal stenosis. Mild left L2 foraminal narrowing.  L3-4: Mild diffuse disc bulge with disc desiccation. Superimposed shallow right foraminal disc protrusion contacts the exiting right L3 nerve root (series 6, image 26). Borderline mild canal and bilateral lateral recess stenosis. Foramina remain patent.  L4-5: Postoperative changes from prior posterior and interbody fusion with decompressive left hemi laminectomy. No residual canal or lateral recess stenosis. Foramina are patent.  L5-S1:  Unremarkable.  IMPRESSION: 1. Mild left eccentric disc bulge at L2-3 with resultant mild left L2 foraminal stenosis. 2. Shallow right foraminal disc protrusion at L3-4, contacting and potentially affecting the exiting right L3 nerve root. 3. Sequelae of prior PLIF at L4-5 without residual or recurrent stenosis.  Electronically Signed    By: Jeannine Boga M.D.   On: 12/22/2017 22:10  Complexity Note: Imaging results reviewed. Results shared with Ms. Kun, using Layman's terms.                         Meds   Current Outpatient Medications:  .  cyclobenzaprine (FLEXERIL) 10 MG tablet, Take 1 tablet (10 mg total) by mouth at bedtime., Disp: 30 tablet, Rfl: 1 .  DULoxetine (CYMBALTA) 30 MG capsule, Take 1 capsule (30 mg total) by mouth 2 (two) times daily., Disp: 60 capsule, Rfl: 1 .  meloxicam (MOBIC) 15 MG tablet, Take 1 tablet (15 mg total) by mouth daily., Disp: 30 tablet, Rfl: 0 .  [START ON 01/27/2018] oxyCODONE-acetaminophen (PERCOCET) 10-325 MG tablet, Take 1 tablet by mouth every 6 (six) hours as needed for pain., Disp: 120 tablet, Rfl: 0  ROS  Constitutional: Denies any fever or chills Gastrointestinal: No reported hemesis, hematochezia, vomiting, or acute GI distress Musculoskeletal: Denies any acute onset joint swelling, redness, loss of ROM, or weakness Neurological: No reported episodes of acute onset apraxia, aphasia, dysarthria, agnosia, amnesia, paralysis, loss of coordination, or loss of consciousness  Allergies  Ms. Sellers is allergic to codeine.  PFSH  Drug: Ms. Richburg  reports that she does not use drugs. Alcohol:  reports that she does  not drink alcohol. Tobacco:  reports that she has been smoking cigarettes.  She has a 20.00 pack-year smoking history. She has never used smokeless tobacco. Medical:  has a past medical history of Allergy, Anxiety, Chronic bilateral low back pain (07/22/2016), Depression, and Kidney cysts (12/21/2017). Surgical: Ms. Alicia  has a past surgical history that includes Abdominal hysterectomy; Tonsillectomy; Cesarean section; Spine surgery; Spine surgery (09/09/2015); and Back surgery. Family: family history includes Arthritis in her father; Breast cancer (age of onset: 40) in her maternal aunt; Cancer in her maternal aunt; Depression in her sister and sister; Diabetes in her  mother; Heart disease in her father and maternal grandfather; Hyperlipidemia in her father; Hypertension in her father and mother; Lung disease in her father; Lymphoma in her father.  Constitutional Exam  General appearance: Well nourished, well developed, and well hydrated. In no apparent acute distress Vitals:   01/05/18 1158  BP: (!) 131/92  Pulse: 81  Temp: 98.3 F (36.8 C)  SpO2: 99%  Weight: 180 lb (81.6 kg)  Height: _0  (1.676 m)  Psych/Mental status: Alert, oriented x 3 (person, place, & time)       Eyes: PERLA R  Lumbar Spine Area Exam  Skin & Axial Inspection: Well healed scar from previous spine surgery detected Alignment: Symmetrical Functional ROM: Unrestricted ROM       Stability: No instability detected Muscle Tone/Strength: Functionally intact. No obvious neuro-muscular anomalies detected. Sensory (Neurological): Unimpaired Palpation: Tender       Provocative Tests: Lumbar Hyperextension/rotation test: deferred today       Lumbar quadrant test (Kemp's test): deferred today       Lumbar Lateral bending test: deferred today       Patrick's Maneuver: deferred today                   FABER test: deferred today                   Thigh-thrust test: deferred today       S-I compression test: deferred today       S-I distraction test: deferred today        Gait & Posture Assessment  Ambulation: Unassisted Gait: Relatively normal for age and body habitus Posture: WNL   Lower Extremity Exam    Side: Right lower extremity  Side: Left lower extremity  Stability: No instability observed          Stability: No instability observed          Skin & Extremity Inspection: Skin color, temperature, and hair growth are WNL. No peripheral edema or cyanosis. No masses, redness, swelling, asymmetry, or associated skin lesions. No contractures.  Skin & Extremity Inspection: Skin color, temperature, and hair growth are WNL. No peripheral edema or cyanosis. No masses, redness,  swelling, asymmetry, or associated skin lesions. No contractures.  Functional ROM: Unrestricted ROM                  Functional ROM: Unrestricted ROM                  Muscle Tone/Strength: Functionally intact. No obvious neuro-muscular anomalies detected.  Muscle Tone/Strength: Functionally intact. No obvious neuro-muscular anomalies detected.  Sensory (Neurological): Unimpaired  Sensory (Neurological): Unimpaired  Palpation: No palpable anomalies  Palpation: No palpable anomalies   Assessment  Primary Diagnosis & Pertinent Problem List: Diagnoses of Lumbar spondylosis, Lumbar facet syndrome (Bilateral), Chronic low back pain (Primary Source of Pain) (midline), Musculoskeletal pain,  and Chronic pain syndrome were pertinent to this visit.  Status Diagnosis  Controlled Controlled Controlled 1. Lumbar spondylosis   2. Lumbar facet syndrome (Bilateral)   3. Chronic low back pain (Primary Source of Pain) (midline)   4. Musculoskeletal pain   5. Chronic pain syndrome     Problems updated and reviewed during this visit: No problems updated. Plan of Care  Pharmacotherapy (Medications Ordered): Meds ordered this encounter  Medications  . cyclobenzaprine (FLEXERIL) 10 MG tablet    Sig: Take 1 tablet (10 mg total) by mouth at bedtime.    Dispense:  30 tablet    Refill:  1    Do not place this medication, or any other prescription from our practice, on "Automatic Refill". Patient may have prescription filled one day early if pharmacy is closed on scheduled refill date.    Order Specific Question:   Supervising Provider    Answer:   Milinda Pointer [phone]  . DULoxetine (CYMBALTA) 30 MG capsule    Sig: Take 1 capsule (30 mg total) by mouth 2 (two) times daily.    Dispense:  60 capsule    Refill:  1    Do not place medication on "Automatic Refill". Fill one day early if pharmacy is closed on scheduled refill date.    Order Specific Question:   Supervising Provider    Answer:   Milinda Pointer [phone]  . oxyCODONE-acetaminophen (PERCOCET) 10-325 MG tablet    Sig: Take 1 tablet by mouth every 6 (six) hours as needed for pain.    Dispense:  120 tablet    Refill:  0    Do not place this medication, or any other prescription from our practice, on "Automatic Refill". Patient may have prescription filled one day early if pharmacy is closed on scheduled refill date. Do not fill until:01/27/2018 To last until: 02/26/2018    Order Specific Question:   Supervising Provider    Answer:   Milinda Pointer [phone]   New Prescriptions   No medications on file   Medications administered today: Gwendelyn Lanting. Ambriz had no medications administered during this visit. Lab-work, procedure(s), and/or referral(s): No orders of the defined types were placed in this encounter.  Imaging and/or referral(s): None   Interventional management options: Planned, scheduled, and/or pending:  SCS on hold. Encouraged Aqua-therapy    Considering:  Diagnostic bilateral lumbar facet block Possible bilateral lumbar facet RFA Diagnostic left sided caudal epidural steroid injection + diagnostic epidurogram Possible left-sided Racz procedure Diagnostic left L4-5 interlaminar lumbar epidural steroid injection Diagnosticleft L4-5 transforaminal epidural steroid injection Diagnostic left L4 selective nerve root block Possible left L4 nerve root ganglion RFA Possible lumbar spinal cord stimulator trial   Palliative PRN treatment(s):  Diagnostic bilateral lumbar facet block(NO STEROIDS   Provider-requested follow-up: Return in about 6 weeks (around 02/16/2018).  Future Appointments  Date Time Provider [place]  01/06/2018  3:00 PM Arnetha Courser, MD [facility]  02/11/2018 11:30 AM Vevelyn Francois, NP [facility] None   Primary Care Physician: Arnetha Courser, MD Location: [facility] Note by: Vevelyn Francois NP Date: 01/05/2018; Time: 2:06 PM  Pain  Score Disclaimer: We use the NRS-11 scale. This is a self-reported, subjective measurement of pain severity with only modest accuracy. It is used primarily to identify changes within a particular patient. It must be understood that outpatient pain scales are significantly less accurate that those used for research, where they can be applied under ideal controlled  circumstances with minimal exposure to variables. In reality, the score is likely to be a combination of pain intensity and pain affect, where pain affect describes the degree of emotional arousal or changes in action readiness caused by the sensory experience of pain. Factors such as social and work situation, setting, emotional state, anxiety levels, expectation, and prior pain experience may influence pain perception and show large inter-individual differences that may also be affected by time variables.  Patient instructions provided during this appointment: Patient Instructions    BMI Assessment: Estimated body mass index is 29.05 kg/m as calculated from the following:   Height as of this encounter: _0  (1.676 m).   Weight as of this encounter: 180 lb (81.6 kg).  BMI interpretation table: BMI level Category Range association with higher incidence of chronic pain  <18 kg/m2 Underweight   18.5-24.9 kg/m2 Ideal body weight   25-29.9 kg/m2 Overweight Increased incidence by 20%  30-34.9 kg/m2 Obese (Class I) Increased incidence by 68%  35-39.9 kg/m2 Severe obesity (Class II) Increased incidence by 136%  >40 kg/m2 Extreme obesity (Class III) Increased incidence by 254%   Patient's current BMI Ideal Body weight  Body mass index is 29.05 kg/m. Ideal body weight: 59.3 kg (130 lb 11.7 oz) Adjusted ideal body weight: 68.2 kg (150 lb 7 oz)   BMI Readings from Last 4 Encounters:  01/05/18 29.05 kg/m  12/29/17 29.05 kg/m  12/10/17 29.05 kg/m  12/01/17 29.05 kg/m   Wt Readings from Last 4 Encounters:  01/05/18 180 lb (81.6 kg)   12/29/17 180 lb (81.6 kg)  12/10/17 180 lb (81.6 kg)  12/01/17 180 lb (81.6 kg)  ____________________________________________________________________________________________  Medication Rules  Applies to: All patients receiving prescriptions (written or electronic).  Pharmacy of record: Pharmacy where electronic prescriptions will be sent. If written prescriptions are taken to a different pharmacy, please inform the nursing staff. The pharmacy listed in the electronic medical record should be the one where you would like electronic prescriptions to be sent.  Prescription refills: Only during scheduled appointments. Applies to both, written and electronic prescriptions.  NOTE: The following applies primarily to controlled substances (Opioid* Pain Medications).   Patient's responsibilities: 1. Pain Pills: Bring all pain pills to every appointment (except for procedure appointments). 2. Pill Bottles: Bring pills in original pharmacy bottle. Always bring newest bottle. Bring bottle, even if empty. 3. Medication refills: You are responsible for knowing and keeping track of what medications you need refilled. The day before your appointment, write a list of all prescriptions that need to be refilled. Bring that list to your appointment and give it to the admitting nurse. Prescriptions will be written only during appointments. If you forget a medication, it will not be "Called in", "Faxed", or "electronically sent". You will need to get another appointment to get these prescribed. 4. Prescription Accuracy: You are responsible for carefully inspecting your prescriptions before leaving our office. Have the discharge nurse carefully go over each prescription with you, before taking them home. Make sure that your name is accurately spelled, that your address is correct. Check the name and dose of your medication to make sure it is accurate. Check the number of pills, and the written instructions to make  sure they are clear and accurate. Make sure that you are given enough medication to last until your next medication refill appointment. 5. Taking Medication: Take medication as prescribed. Never take more pills than instructed. Never take medication more frequently than prescribed. Taking less pills or  less frequently is permitted and encouraged, when it comes to controlled substances (written prescriptions).  6. Inform other Doctors: Always inform, all of your healthcare providers, of all the medications you take. 7. Pain Medication from other Providers: You are not allowed to accept any additional pain medication from any other Doctor or Healthcare provider. There are two exceptions to this rule. (see below) In the event that you require additional pain medication, you are responsible for notifying us, as stated below. 8. Medication Agreement: You are responsible for carefully reading and following our Medication Agreement. This must be signed before receiving any prescriptions from our practice. Safely store a copy of your signed Agreement. Violations to the Agreement will result in no further prescriptions. (Additional copies of our Medication Agreement are available upon request.) 9. Laws, Rules, & Regulations: All patients are expected to follow all Federal and [employer], [employer], Rules, [employer]. Ignorance of the Laws does not constitute a valid excuse. The use of any illegal substances is prohibited. 10. Adopted CDC guidelines & recommendations: Target dosing levels will be at or below 60 MME/day. Use of benzodiazepines** is not recommended.  Exceptions: There are only two exceptions to the rule of not receiving pain medications from other Healthcare Providers. 1. Exception #1 (Emergencies): In the event of an emergency (i.e.: accident requiring emergency care), you are allowed to receive additional pain medication. However, you are responsible for: As soon as you are able, call our office  [phone], at any time of the day or night, and leave a message stating your name, the date and nature of the emergency, and the name and dose of the medication prescribed. In the event that your call is answered by a member of our staff, make sure to document and save the date, time, and the name of the person that took your information.  2. Exception #2 (Planned Surgery): In the event that you are scheduled by another doctor or dentist to have any type of surgery or procedure, you are allowed (for a period no longer than 30 days), to receive additional pain medication, for the acute post-op pain. However, in this case, you are responsible for picking up a copy of our "Post-op Pain Management for Surgeons" handout, and giving it to your surgeon or dentist. This document is available at our office, and does not require an appointment to obtain it. Simply go to our office during business hours (Monday-Thursday from 8:00 AM to 4:00 PM) (Friday 8:00 AM to 12:00 Noon) or if you have a scheduled appointment with [place], prior to your surgery, and ask for it by name. In addition, you will need to provide [place] with your name, name of your surgeon, type of surgery, and date of procedure or surgery.  *Opioid medications include: morphine, codeine, oxycodone, oxymorphone, hydrocodone, hydromorphone, meperidine, tramadol, tapentadol, buprenorphine, fentanyl, methadone. **Benzodiazepine medications include: diazepam (Valium), alprazolam (Xanax), clonazepam (Klonopine), lorazepam (Ativan), clorazepate (Tranxene), chlordiazepoxide (Librium), estazolam (Prosom), oxazepam (Serax), temazepam (Restoril), triazolam (Halcion) (Last updated: 08/07/2017) ____________________________________________________________________________________________

## 2018-01-05 NOTE — Patient Instructions (Signed)
BMI Assessment: Estimated body mass index is 29.05 kg/m as calculated from the following:   Height as of this encounter: 5\' 6"  (1.676 m).   Weight as of this encounter: 180 lb (81.6 kg).  BMI interpretation table: BMI level Category Range association with higher incidence of chronic pain  <18 kg/m2 Underweight   18.5-24.9 kg/m2 Ideal body weight   25-29.9 kg/m2 Overweight Increased incidence by 20%  30-34.9 kg/m2 Obese (Class I) Increased incidence by 68%  35-39.9 kg/m2 Severe obesity (Class II) Increased incidence by 136%  >40 kg/m2 Extreme obesity (Class III) Increased incidence by 254%   Patient's current BMI Ideal Body weight  Body mass index is 29.05 kg/m. Ideal body weight: 59.3 kg (130 lb 11.7 oz) Adjusted ideal body weight: 68.2 kg (150 lb 7 oz)   BMI Readings from Last 4 Encounters:  01/05/18 29.05 kg/m  12/29/17 29.05 kg/m  12/10/17 29.05 kg/m  12/01/17 29.05 kg/m   Wt Readings from Last 4 Encounters:  01/05/18 180 lb (81.6 kg)  12/29/17 180 lb (81.6 kg)  12/10/17 180 lb (81.6 kg)  12/01/17 180 lb (81.6 kg)  ____________________________________________________________________________________________  Medication Rules  Applies to: All patients receiving prescriptions (written or electronic).  Pharmacy of record: Pharmacy where electronic prescriptions will be sent. If written prescriptions are taken to a different pharmacy, please inform the nursing staff. The pharmacy listed in the electronic medical record should be the one where you would like electronic prescriptions to be sent.  Prescription refills: Only during scheduled appointments. Applies to both, written and electronic prescriptions.  NOTE: The following applies primarily to controlled substances (Opioid* Pain Medications).   Patient's responsibilities: 1. Pain Pills: Bring all pain pills to every appointment (except for procedure appointments). 2. Pill Bottles: Bring pills in original pharmacy  bottle. Always bring newest bottle. Bring bottle, even if empty. 3. Medication refills: You are responsible for knowing and keeping track of what medications you need refilled. The day before your appointment, write a list of all prescriptions that need to be refilled. Bring that list to your appointment and give it to the admitting nurse. Prescriptions will be written only during appointments. If you forget a medication, it will not be "Called in", "Faxed", or "electronically sent". You will need to get another appointment to get these prescribed. 4. Prescription Accuracy: You are responsible for carefully inspecting your prescriptions before leaving our office. Have the discharge nurse carefully go over each prescription with you, before taking them home. Make sure that your name is accurately spelled, that your address is correct. Check the name and dose of your medication to make sure it is accurate. Check the number of pills, and the written instructions to make sure they are clear and accurate. Make sure that you are given enough medication to last until your next medication refill appointment. 5. Taking Medication: Take medication as prescribed. Never take more pills than instructed. Never take medication more frequently than prescribed. Taking less pills or less frequently is permitted and encouraged, when it comes to controlled substances (written prescriptions).  6. Inform other Doctors: Always inform, all of your healthcare providers, of all the medications you take. 7. Pain Medication from other Providers: You are not allowed to accept any additional pain medication from any other Doctor or Healthcare provider. There are two exceptions to this rule. (see below) In the event that you require additional pain medication, you are responsible for notifying us, as stated below. 8. Medication Agreement: You are responsible for carefully  reading and following our Medication Agreement. This must be signed  before receiving any prescriptions from our practice. Safely store a copy of your signed Agreement. Violations to the Agreement will result in no further prescriptions. (Additional copies of our Medication Agreement are available upon request.) 9. Laws, Rules, & Regulations: All patients are expected to follow all Federal and [employer], [employer], Rules, [employer]. Ignorance of the Laws does not constitute a valid excuse. The use of any illegal substances is prohibited. 10. Adopted CDC guidelines & recommendations: Target dosing levels will be at or below 60 MME/day. Use of benzodiazepines** is not recommended.  Exceptions: There are only two exceptions to the rule of not receiving pain medications from other Healthcare Providers. 1. Exception #1 (Emergencies): In the event of an emergency (i.e.: accident requiring emergency care), you are allowed to receive additional pain medication. However, you are responsible for: As soon as you are able, call our office [phone], at any time of the day or night, and leave a message stating your name, the date and nature of the emergency, and the name and dose of the medication prescribed. In the event that your call is answered by a member of our staff, make sure to document and save the date, time, and the name of the person that took your information.  2. Exception #2 (Planned Surgery): In the event that you are scheduled by another doctor or dentist to have any type of surgery or procedure, you are allowed (for a period no longer than 30 days), to receive additional pain medication, for the acute post-op pain. However, in this case, you are responsible for picking up a copy of our "Post-op Pain Management for Surgeons" handout, and giving it to your surgeon or dentist. This document is available at our office, and does not require an appointment to obtain it. Simply go to our office during business hours (Monday-Thursday from 8:00 AM to 4:00 PM) (Friday 8:00  AM to 12:00 Noon) or if you have a scheduled appointment with [place], prior to your surgery, and ask for it by name. In addition, you will need to provide [place] with your name, name of your surgeon, type of surgery, and date of procedure or surgery.  *Opioid medications include: morphine, codeine, oxycodone, oxymorphone, hydrocodone, hydromorphone, meperidine, tramadol, tapentadol, buprenorphine, fentanyl, methadone. **Benzodiazepine medications include: diazepam (Valium), alprazolam (Xanax), clonazepam (Klonopine), lorazepam (Ativan), clorazepate (Tranxene), chlordiazepoxide (Librium), estazolam (Prosom), oxazepam (Serax), temazepam (Restoril), triazolam (Halcion) (Last updated: 08/07/2017) ____________________________________________________________________________________________

## 2018-01-06 ENCOUNTER — Ambulatory Visit: Payer: Self-pay | Admitting: Family Medicine

## 2018-01-08 ENCOUNTER — Encounter: Payer: Self-pay | Admitting: Family Medicine

## 2018-01-08 ENCOUNTER — Ambulatory Visit: Payer: BLUE CROSS/BLUE SHIELD | Admitting: Family Medicine

## 2018-01-08 VITALS — BP 132/84 | HR 91 | Temp 98.3°F | Resp 16 | Ht 66.0 in | Wt 178.1 lb

## 2018-01-08 DIAGNOSIS — R109 Unspecified abdominal pain: Secondary | ICD-10-CM

## 2018-01-08 DIAGNOSIS — N281 Cyst of kidney, acquired: Secondary | ICD-10-CM | POA: Insufficient documentation

## 2018-01-08 DIAGNOSIS — M47816 Spondylosis without myelopathy or radiculopathy, lumbar region: Secondary | ICD-10-CM | POA: Diagnosis not present

## 2018-01-08 DIAGNOSIS — Z720 Tobacco use: Secondary | ICD-10-CM

## 2018-01-08 NOTE — Assessment & Plan Note (Signed)
Treated by pain clinic doctor

## 2018-01-08 NOTE — Patient Instructions (Addendum)
Please call [phone] to schedule your imaging test (ultrasound of the kidneys) Please wait 2-3 days after the order has been placed to call and get your test scheduled  I do encourage you to quit smoking Call [phone] to sign up for smoking cessation classes You can call 1-800-QUIT-NOW to talk with a smoking cessation coach   Steps to Quit Smoking Smoking tobacco can be harmful to your health and can affect almost every organ in your body. Smoking puts you, and those around you, at risk for developing many serious chronic diseases. Quitting smoking is difficult, but it is one of the best things that you can do for your health. It is never too late to quit. What are the benefits of quitting smoking? When you quit smoking, you lower your risk of developing serious diseases and conditions, such as:  Lung cancer or lung disease, such as COPD.  Heart disease.  Stroke.  Heart attack.  Infertility.  Osteoporosis and bone fractures.  Additionally, symptoms such as coughing, wheezing, and shortness of breath may get better when you quit. You may also find that you get sick less often because your body is stronger at fighting off colds and infections. If you are pregnant, quitting smoking can help to reduce your chances of having a baby of low birth weight. How do I get ready to quit? When you decide to quit smoking, create a plan to make sure that you are successful. Before you quit:  Pick a date to quit. Set a date within the next two weeks to give you time to prepare.  Write down the reasons why you are quitting. Keep this list in places where you will see it often, such as on your bathroom mirror or in your car or wallet.  Identify the people, places, things, and activities that make you want to smoke (triggers) and avoid them. Make sure to take these actions: ? Throw away all cigarettes at home, at work, and in your car. ? Throw away smoking accessories, such as Scientist, physiological. ? Clean your car and make sure to empty the ashtray. ? Clean your home, including curtains and carpets.  Tell your family, friends, and coworkers that you are quitting. Support from your loved ones can make quitting easier.  Talk with your health care provider about your options for quitting smoking.  Find out what treatment options are covered by your health insurance.  What strategies can I use to quit smoking? Talk with your healthcare provider about different strategies to quit smoking. Some strategies include:  Quitting smoking altogether instead of gradually lessening how much you smoke over a period of time. Research shows that quitting "cold [place]" is more successful than gradually quitting.  Attending in-person counseling to help you build problem-solving skills. You are more likely to have success in quitting if you attend several counseling sessions. Even short sessions of 10 minutes can be effective.  Finding resources and support systems that can help you to quit smoking and remain smoke-free after you quit. These resources are most helpful when you use them often. They can include: ? Online chats with a Social worker. ? Telephone quitlines. ? Careers information officer. ? Support groups or group counseling. ? Text messaging programs. ? Mobile phone applications.  Taking medicines to help you quit smoking. (If you are pregnant or breastfeeding, talk with your health care provider first.) Some medicines contain nicotine and some do not. Both types of medicines help with cravings, but the  medicines that include nicotine help to relieve withdrawal symptoms. Your health care provider may recommend: ? Nicotine patches, gum, or lozenges. ? Nicotine inhalers or sprays. ? Non-nicotine medicine that is taken by mouth.  Talk with your health care provider about combining strategies, such as taking medicines while you are also receiving in-person counseling. Using these two  strategies together makes you more likely to succeed in quitting than if you used either strategy on its own. If you are pregnant or breastfeeding, talk with your health care provider about finding counseling or other support strategies to quit smoking. Do not take medicine to help you quit smoking unless told to do so by your health care provider. What things can I do to make it easier to quit? Quitting smoking might feel overwhelming at first, but there is a lot that you can do to make it easier. Take these important actions:  Reach out to your family and friends and ask that they support and encourage you during this time. Call telephone quitlines, reach out to support groups, or work with a counselor for support.  Ask people who smoke to avoid smoking around you.  Avoid places that trigger you to smoke, such as bars, parties, or smoke-break areas at work.  Spend time around people who do not smoke.  Lessen stress in your life, because stress can be a smoking trigger for some people. To lessen stress, try: ? Exercising regularly. ? Deep-breathing exercises. ? Yoga. ? Meditating. ? Performing a body scan. This involves closing your eyes, scanning your body from head to toe, and noticing which parts of your body are particularly tense. Purposefully relax the muscles in those areas.  Download or purchase mobile phone or tablet apps (applications) that can help you stick to your quit plan by providing reminders, tips, and encouragement. There are many free apps, such as QuitGuide from the [employer] Office manager for Disease Control and Prevention). You can find other support for quitting smoking (smoking cessation) through smokefree.gov and other websites.  How will I feel when I quit smoking? Within the first 24 hours of quitting smoking, you may start to feel some withdrawal symptoms. These symptoms are usually most noticeable 2-3 days after quitting, but they usually do not last beyond 2-3 weeks.  Changes or symptoms that you might experience include:  Mood swings.  Restlessness, anxiety, or irritation.  Difficulty concentrating.  Dizziness.  Strong cravings for sugary foods in addition to nicotine.  Mild weight gain.  Constipation.  Nausea.  Coughing or a sore throat.  Changes in how your medicines work in your body.  A depressed mood.  Difficulty sleeping (insomnia).  After the first 2-3 weeks of quitting, you may start to notice more positive results, such as:  Improved sense of smell and taste.  Decreased coughing and sore throat.  Slower heart rate.  Lower blood pressure.  Clearer skin.  The ability to breathe more easily.  Fewer sick days.  Quitting smoking is very challenging for most people. Do not get discouraged if you are not successful the first time. Some people need to make many attempts to quit before they achieve long-term success. Do your best to stick to your quit plan, and talk with your health care provider if you have any questions or concerns. This information is not intended to replace advice given to you by your health care provider. Make sure you discuss any questions you have with your health care provider. Document Released: 05/21/2001 Document Revised: 01/23/2016 Document  Reviewed: 10/11/2014 Elsevier Interactive Patient Education  [employer].

## 2018-01-08 NOTE — Progress Notes (Signed)
BP 132/84   Pulse 91   Temp 98.3 F (36.8 C) (Oral)   Resp 16   Ht 5\' 6"  (1.676 m)   Wt 178 lb 1.6 oz (80.8 kg)   SpO2 97%   BMI 28.75 kg/m    Subjective:    Patient ID: [name], female    DOB: 1970/06/17, 47 y.o.   MRN: [id number]  HPI: [name] is a 47 y.o. female  Chief Complaint  Patient presents with  . Results    HPI Patient is here for "results" She has been feeling like her kidneys have been "acting up"; spasming, bad aching, stabbing aches on both sides; feels like something is irritating in there, pointing over the right side No blood in the urine; no dysuria; some urinary frequency at night; no strong smell to the urine  Acting up for several months No fam hx of kidney cancer; sister had kidney stones; no personal hx of kidney stones  MRI reviewed; 9 mm cyst of the posterior right kidney noted in the report; she sees pain clinic doctor for chronic back issues  Does not drink sweet tea, mostly water; tries to stay well-hydrated  Smoking 1 ppd; first cig is right away; last cig of the day right before going to bed; not smoking in the middle of the night now; she did not take the Chantix earlier, but says she is ready to quit now   Depression screen [facility] 2/9 01/08/2018 01/05/2018 12/01/2017 11/10/2017 10/07/2017  Decreased Interest 0 0 0 0 0  Down, Depressed, Hopeless 0 0 0 0 0  PHQ - 2 Score 0 0 0 0 0  Altered sleeping 0 - - - -  Tired, decreased energy 0 - - - -  Change in appetite 0 - - - -  Feeling bad or failure about yourself  0 - - - -  Trouble concentrating 0 - - - -  Moving slowly or fidgety/restless 0 - - - -  Suicidal thoughts 0 - - - -  PHQ-9 Score 0 - - - -  Difficult doing work/chores Not difficult at all - - - -  Some recent data might be hidden    Relevant past medical, surgical, family and social history reviewed Past Medical History:  Diagnosis Date  . Allergy   . Anxiety   . Chronic bilateral low back pain 07/22/2016   Started in  her 20's; managed by pain clinic  . Depression   . Kidney cysts 12/21/2017   Past Surgical History:  Procedure Laterality Date  . ABDOMINAL HYSTERECTOMY    . BACK SURGERY     two back sx one in [place] and one at [place]  . CESAREAN SECTION    . SPINE SURGERY    . SPINE SURGERY  09/09/2015  . TONSILLECTOMY     Family History  Problem Relation Age of Onset  . Hypertension Mother   . Diabetes Mother   . Hypertension Father   . Hyperlipidemia Father   . Arthritis Father   . Lung disease Father   . Heart disease Father   . Lymphoma Father   . Cancer Maternal Aunt        breast  . Breast cancer Maternal Aunt 34  . Depression Sister   . Heart disease Maternal Grandfather   . Depression Sister   . Stroke Neg Hx    Social History   Tobacco Use  . Smoking status: Current Every Day Smoker  Packs/day: 1.00    Years: 20.00    Pack years: 20.00    Types: Cigarettes  . Smokeless tobacco: Never Used  Substance Use Topics  . Alcohol use: No  . Drug use: No    Interim medical history since last visit reviewed. Allergies and medications reviewed  Review of Systems Per HPI unless specifically indicated above     Objective:    BP 132/84   Pulse 91   Temp 98.3 F (36.8 C) (Oral)   Resp 16   Ht 5\' 6"  (1.676 m)   Wt 178 lb 1.6 oz (80.8 kg)   SpO2 97%   BMI 28.75 kg/m   Wt Readings from Last 3 Encounters:  01/08/18 178 lb 1.6 oz (80.8 kg)  01/05/18 180 lb (81.6 kg)  12/29/17 180 lb (81.6 kg)    Physical Exam  Constitutional: She appears well-developed and well-nourished.  HENT:  Mouth/Throat: Mucous membranes are normal.  Eyes: EOM are normal. No scleral icterus.  Cardiovascular: Normal rate and regular rhythm.  Pulmonary/Chest: Effort normal and breath sounds normal.  Abdominal: Soft. Normal appearance and bowel sounds are normal. She exhibits no abdominal bruit. There is no tenderness.  Skin: No pallor.  Psychiatric: She has a normal mood and affect. Her  behavior is normal.    Results for orders placed or performed in visit on 12/10/17  ToxASSURE Select 13 (MW), Urine  Result Value Ref Range   Summary FINAL       Assessment & Plan:   Problem List Items Addressed This Visit      Musculoskeletal and Integument   Lumbar facet syndrome (Bilateral) (Chronic)    Treated by pain clinic doctor        Genitourinary   Cyst of right kidney    9 mm cyst incidentally noted on the MRI of the lumbar spine; explained most of these are benign; she has discomfort and concern; will get dedicated US and check urine today; checking renal function as well      Relevant Orders   Urinalysis w microscopic + reflex cultur   US Renal     Other   Tobacco use    Encouragement given; see AVS; she may start Chantix       Other Visit Diagnoses    Acute right flank pain    -  Primary   Relevant Orders   Urinalysis w microscopic + reflex cultur   Bilateral flank pain       not likely that her 9 mm cyst is causing pain; will get dedicated US of kidneys, check renal fucntion and urine today   Relevant Orders   Urinalysis w microscopic + reflex cultur   US Renal       Follow up plan: No follow-ups on file.  An after-visit summary was printed and given to the patient at [facility].  Please see the patient instructions which may contain other information and recommendations beyond what is mentioned above in the assessment and plan.  No orders of the defined types were placed in this encounter.   Orders Placed This Encounter  Procedures  . US Renal  . Urinalysis w microscopic + reflex cultur

## 2018-01-08 NOTE — Assessment & Plan Note (Signed)
9 mm cyst incidentally noted on the MRI of the lumbar spine; explained most of these are benign; she has discomfort and concern; will get dedicated US and check urine today; checking renal function as well

## 2018-01-08 NOTE — Assessment & Plan Note (Signed)
Encouragement given; see AVS; she may start Chantix

## 2018-01-09 LAB — COMPLETE METABOLIC PANEL WITH GFR
AG RATIO: 2.2 (calc) (ref 1.0–2.5)
ALT: 12 U/L (ref 6–29)
AST: 15 U/L (ref 10–35)
Albumin: 4.4 g/dL (ref 3.6–5.1)
Alkaline phosphatase (APISO): 59 U/L (ref 33–115)
BILIRUBIN TOTAL: 0.4 mg/dL (ref 0.2–1.2)
BUN: 19 mg/dL (ref 7–25)
CALCIUM: 9.7 mg/dL (ref 8.6–10.2)
CHLORIDE: 105 mmol/L (ref 98–110)
CO2: 27 mmol/L (ref 20–32)
Creat: 0.89 mg/dL (ref 0.50–1.10)
GFR, EST NON AFRICAN AMERICAN: 77 mL/min/{1.73_m2} (ref >=60)
GFR, Est African American: 89 mL/min/{1.73_m2} (ref >=60)
GLUCOSE: 101 mg/dL — AB (ref 65–99)
Globulin: 2 g/dL (calc) (ref 1.9–3.7)
POTASSIUM: 4.5 mmol/L (ref 3.5–5.3)
Sodium: 140 mmol/L (ref 135–146)
TOTAL PROTEIN: 6.4 g/dL (ref 6.1–8.1)

## 2018-01-09 LAB — CBC WITH DIFFERENTIAL/PLATELET
BASOS PCT: 0.4 %
Basophils Absolute: 30 cells/uL (ref 0–200)
Eosinophils Absolute: 252 cells/uL (ref 15–500)
Eosinophils Relative: 3.4 %
HCT: 42.7 % (ref 35.0–45.0)
HEMOGLOBIN: 14.5 g/dL (ref 11.7–15.5)
Lymphs Abs: 2982 cells/uL (ref 850–3900)
MCH: 29.2 pg (ref 27.0–33.0)
MCHC: 34 g/dL (ref 32.0–36.0)
MCV: 85.9 fL (ref 80.0–100.0)
MPV: 10.4 fL (ref 7.5–12.5)
Monocytes Relative: 6.2 %
NEUTROS ABS: 3678 {cells}/uL (ref 1500–7800)
Neutrophils Relative %: 49.7 %
PLATELETS: 251 10*3/uL (ref 140–400)
RBC: 4.97 10*6/uL (ref 3.80–5.10)
RDW: 13.2 % (ref 11.0–15.0)
TOTAL LYMPHOCYTE: 40.3 %
WBC mixed population: 459 cells/uL (ref 200–950)
WBC: 7.4 10*3/uL (ref 3.8–10.8)

## 2018-01-09 LAB — URINALYSIS W MICROSCOPIC + REFLEX CULTURE
Bacteria, UA: NONE SEEN /HPF
Bilirubin Urine: NEGATIVE
Glucose, UA: NEGATIVE
HYALINE CAST: NONE SEEN /LPF
Hgb urine dipstick: NEGATIVE
Ketones, ur: NEGATIVE
Leukocyte Esterase: NEGATIVE
Nitrites, Initial: NEGATIVE
PH: 6 (ref 5.0–8.0)
Protein, ur: NEGATIVE
RBC / HPF: NONE SEEN /HPF (ref 0–2)
Specific Gravity, Urine: 1.024 (ref 1.001–1.03)
WBC, UA: NONE SEEN /HPF (ref 0–5)

## 2018-01-09 LAB — LIPID PANEL
Cholesterol: 193 mg/dL (ref <200)
HDL: 48 mg/dL — AB (ref >50)
LDL Cholesterol (Calc): 115 mg/dL (calc) — ABNORMAL HIGH
Non-HDL Cholesterol (Calc): 145 mg/dL (calc) — ABNORMAL HIGH (ref <130)
Total CHOL/HDL Ratio: 4 (calc) (ref <5.0)
Triglycerides: 187 mg/dL — ABNORMAL HIGH (ref <150)

## 2018-01-09 LAB — NO CULTURE INDICATED

## 2018-01-09 LAB — TSH: TSH: 2.18 mIU/L

## 2018-01-13 ENCOUNTER — Ambulatory Visit: Payer: BLUE CROSS/BLUE SHIELD

## 2018-01-21 ENCOUNTER — Ambulatory Visit
Admission: RE | Admit: 2018-01-21 | Discharge: 2018-01-21 | Disposition: A | Discharge status: Home / Self Care | Payer: BLUE CROSS/BLUE SHIELD | Source: Ambulatory Visit | Attending: Family Medicine | Admitting: Family Medicine

## 2018-01-21 DIAGNOSIS — N281 Cyst of kidney, acquired: Secondary | ICD-10-CM | POA: Diagnosis not present

## 2018-01-21 DIAGNOSIS — R109 Unspecified abdominal pain: Secondary | ICD-10-CM | POA: Insufficient documentation

## 2018-02-11 ENCOUNTER — Other Ambulatory Visit: Payer: Self-pay

## 2018-02-11 ENCOUNTER — Encounter: Payer: Self-pay | Admitting: Nurse Practitioner

## 2018-02-11 ENCOUNTER — Ambulatory Visit: Payer: BLUE CROSS/BLUE SHIELD | Attending: Nurse Practitioner | Admitting: Nurse Practitioner

## 2018-02-11 VITALS — BP 134/93 | HR 73 | Temp 98.3°F | Resp 18 | Ht 66.0 in | Wt 180.0 lb

## 2018-02-11 DIAGNOSIS — F329 Major depressive disorder, single episode, unspecified: Secondary | ICD-10-CM | POA: Diagnosis not present

## 2018-02-11 DIAGNOSIS — Z803 Family history of malignant neoplasm of breast: Secondary | ICD-10-CM | POA: Insufficient documentation

## 2018-02-11 DIAGNOSIS — Z807 Family history of other malignant neoplasms of lymphoid, hematopoietic and related tissues: Secondary | ICD-10-CM | POA: Insufficient documentation

## 2018-02-11 DIAGNOSIS — G894 Chronic pain syndrome: Secondary | ICD-10-CM | POA: Diagnosis not present

## 2018-02-11 DIAGNOSIS — Z8261 Family history of arthritis: Secondary | ICD-10-CM | POA: Diagnosis not present

## 2018-02-11 DIAGNOSIS — M5442 Lumbago with sciatica, left side: Secondary | ICD-10-CM

## 2018-02-11 DIAGNOSIS — Z885 Allergy status to narcotic agent status: Secondary | ICD-10-CM | POA: Insufficient documentation

## 2018-02-11 DIAGNOSIS — Z791 Long term (current) use of non-steroidal anti-inflammatories (NSAID): Secondary | ICD-10-CM | POA: Diagnosis not present

## 2018-02-11 DIAGNOSIS — G8929 Other chronic pain: Secondary | ICD-10-CM

## 2018-02-11 DIAGNOSIS — F17210 Nicotine dependence, cigarettes, uncomplicated: Secondary | ICD-10-CM | POA: Diagnosis not present

## 2018-02-11 DIAGNOSIS — Z833 Family history of diabetes mellitus: Secondary | ICD-10-CM | POA: Diagnosis not present

## 2018-02-11 DIAGNOSIS — M533 Sacrococcygeal disorders, not elsewhere classified: Secondary | ICD-10-CM | POA: Diagnosis not present

## 2018-02-11 DIAGNOSIS — M47816 Spondylosis without myelopathy or radiculopathy, lumbar region: Secondary | ICD-10-CM | POA: Insufficient documentation

## 2018-02-11 DIAGNOSIS — Z79899 Other long term (current) drug therapy: Secondary | ICD-10-CM | POA: Insufficient documentation

## 2018-02-11 DIAGNOSIS — F419 Anxiety disorder, unspecified: Secondary | ICD-10-CM | POA: Diagnosis not present

## 2018-02-11 DIAGNOSIS — M7918 Myalgia, other site: Secondary | ICD-10-CM | POA: Diagnosis not present

## 2018-02-11 DIAGNOSIS — E559 Vitamin D deficiency, unspecified: Secondary | ICD-10-CM | POA: Insufficient documentation

## 2018-02-11 DIAGNOSIS — Z8249 Family history of ischemic heart disease and other diseases of the circulatory system: Secondary | ICD-10-CM | POA: Diagnosis not present

## 2018-02-11 DIAGNOSIS — N281 Cyst of kidney, acquired: Secondary | ICD-10-CM | POA: Insufficient documentation

## 2018-02-11 DIAGNOSIS — Z9889 Other specified postprocedural states: Secondary | ICD-10-CM | POA: Insufficient documentation

## 2018-02-11 DIAGNOSIS — Z90710 Acquired absence of both cervix and uterus: Secondary | ICD-10-CM | POA: Diagnosis not present

## 2018-02-11 DIAGNOSIS — M48061 Spinal stenosis, lumbar region without neurogenic claudication: Secondary | ICD-10-CM | POA: Insufficient documentation

## 2018-02-11 DIAGNOSIS — Z79891 Long term (current) use of opiate analgesic: Secondary | ICD-10-CM | POA: Diagnosis not present

## 2018-02-11 MED ORDER — CYCLOBENZAPRINE HCL 10 MG PO TABS: 10.0000 mg | ORAL_TABLET | Freq: Every day | ORAL | 1 refills | Status: DC

## 2018-02-11 MED ORDER — MELOXICAM 15 MG PO TABS: 15.0000 mg | ORAL_TABLET | Freq: Every day | ORAL | 0 refills | Status: DC

## 2018-02-11 MED ORDER — OXYCODONE-ACETAMINOPHEN 10-325 MG PO TABS: 1.0000 | ORAL_TABLET | Freq: Four times a day (QID) | ORAL | 0 refills | Status: DC | PRN

## 2018-02-11 MED ORDER — DULOXETINE HCL 30 MG PO CPEP
30.0000 mg | ORAL_CAPSULE | Freq: Two times a day (BID) | ORAL | 1 refills | Status: DC
Start: 2018-02-11 — End: 2019-06-08

## 2018-02-11 NOTE — Progress Notes (Signed)
Patient's Name: [name]  MRN: [id number]  Referring Provider: Arnetha Courser, MD  DOB: 1970-07-22  PCP: Arnetha Courser, MD  DOS: 02/11/2018  Note by: Vevelyn Francois NP  Service setting: Ambulatory outpatient  Specialty: Interventional Pain Management  Location: [name] (AMB) Pain Management Facility    Patient type: Established    Primary Reason(s) for Visit: Encounter for prescription drug management. (Level of risk: moderate)  CC: Back Pain (low and midline)  HPI  [name] is a 47 y.o. year old, female patient, who comes today for a medication management evaluation. She has Adjustment disorder with depressed mood; Stressful life events affecting family and household; Vitamin D deficiency; Lumbosacral spondylosis without myelopathy; Lumbar central spinal stenosis (L4-5); Breast cancer screening; Bladder irritation; Chronic low back pain (Primary Source of Pain) (midline); Arthralgia of multiple joints; Chronic, continuous use of opioids; Elevated rheumatoid factor; Chronic pain syndrome; Long term (current) use of opiate analgesic; Long term prescription opiate use; Opiate use (85.5 MME/Day); Chronic hip pain (Secondary source of pain) (Left); Failed back surgical syndrome; Discogenic low back pain; Lumbar discogenic pain syndrome; Chronic sacroiliac joint pain (Left); Chronic musculoskeletal pain; Lumbar facet syndrome (Bilateral); Lumbar spondylosis; Neurogenic pain; Depression; Acute postoperative pain; Trigger point with back pain (Left); Tobacco use; Preventative health care; and Cyst of right kidney on their problem list. Her primarily concern today is the Back Pain (low and midline)  Pain Assessment: Location: Medial, Lower Back Radiating: denies Onset: More than a month ago Duration: Chronic pain Quality: Aching, Constant Severity: 3 /10 (subjective, self-reported pain score)  Note: Reported level is compatible with observation.                          Effect on ADL:   Timing:  Constant Modifying factors: heat, baths, medications, walking, position changes BP: (!) 134/93  HR: 73  [name] was last scheduled for an appointment on 01/05/2018 for medication management. During today's appointment we reviewed [name]'s chronic pain status, as well as her outpatient medication regimen.She admits that her pain persist.  She has declined spinal cord stimulator she has declined the pain pump and she has not started aqua therapy.  The patient  reports that she does not use drugs. Her body mass index is 29.05 kg/m.  Further details on both, my assessment(s), as well as the proposed treatment plan, please see below.  Controlled Substance Pharmacotherapy Assessment REMS (Risk Evaluation and Mitigation Strategy)  Analgesic:Oxycodone/acetaminophenIR 10/313m every 6 hours (463mdayof oxycodone) (60MME/day) MME/day:[id number] ShaHart [place]N  02/11/2018 12:03 PM  Sign at close encounter Nursing Pain Medication Assessment:  Safety precautions to be maintained throughout the outpatient stay will include: orient to surroundings, keep bed in low position, maintain call bell within reach at all times, provide assistance with transfer out of bed and ambulation.  Medication Inspection Compliance: Pill count conducted under aseptic conditions, in front of the patient. Neither the pills nor the bottle was removed from the patient's sight at any time. Once count was completed pills were immediately returned to the patient in their original bottle.  Medication: Oxycodone IR Pill/Patch Count: 60 of 120 pills remain Pill/Patch Appearance: Markings consistent with prescribed medication Bottle Appearance: Standard pharmacy container. Clearly labeled. Filled Date: 08 / 20 / 2019 Last Medication intake:  Today   Pharmacokinetics: Liberation and absorption (onset of action): WNL Distribution (time to peak effect): WNL Metabolism and excretion (duration of action): WNL  Pharmacodynamics: Desired effects: Analgesia: [name] reports >50% benefit. Functional ability: Patient reports that medication allows her to accomplish basic ADLs Clinically meaningful improvement in function (CMIF): Sustained CMIF goals met Perceived effectiveness: Described as relatively effective, allowing for increase in activities of daily living (ADL) Undesirable effects: Side-effects or Adverse reactions: None reported Monitoring: [place] PMP: Online review of the past 75-monthperiod conducted. Compliant with practice rules and regulations Last UDS on record: Summary  Date Value Ref Range Status  12/10/2017 FINAL  Final    Comment:    ==================================================================== TOXASSURE SELECT 13 (MW) ==================================================================== Test                             Result       Flag       Units Drug Present and Declared for Prescription Verification   Oxycodone                      2088         EXPECTED   ng/mg creat   Oxymorphone                    1319         EXPECTED   ng/mg creat   Noroxycodone                   4143         EXPECTED   ng/mg creat   Noroxymorphone                 300          EXPECTED   ng/mg creat    Sources of oxycodone are scheduled prescription medications.    Oxymorphone, noroxycodone, and noroxymorphone are expected    metabolites of oxycodone. Oxymorphone is also available as a    scheduled prescription medication. ==================================================================== Test                      Result    Flag   Units      Ref Range   Creatinine              215              mg/dL      >=20 ==================================================================== Declared Medications:  The flagging and interpretation on this report are based on the  following declared medications.  Unexpected results may arise from  inaccuracies in the declared medications.  **Note: The  testing scope of this panel includes these medications:  Oxycodone (Oxycodone Acetaminophen)  **Note: The testing scope of this panel does not include following  reported medications:  Acetaminophen (Oxycodone Acetaminophen)  Cyclobenzaprine  Duloxetine  Meloxicam ==================================================================== For clinical consultation, please call ([phone] ====================================================================    UDS interpretation: Compliant          Medication Assessment Form: Reviewed. Patient indicates being compliant with therapy Treatment compliance: Compliant Risk Assessment Profile: Aberrant behavior: See prior evaluations. None observed or detected today Comorbid factors increasing risk of overdose: See prior notes. No additional risks detected today Opioid risk tool (ORT) (Total Score):   Personal History of Substance Abuse (SUD-Substance use disorder):  Alcohol:    Illegal Drugs:    Rx Drugs:    ORT Risk Level calculation:   Risk of substance use disorder (SUD): Low  ORT Scoring interpretation table:  Score <3 = Low Risk for SUD  Score between 4-7 = Moderate Risk for SUD  Score >8 = High Risk for Opioid Abuse   Risk Mitigation Strategies:  Patient Counseling: Covered Patient-Prescriber Agreement (PPA): Present and active  Notification to other healthcare providers: Done  Pharmacologic Plan: No change in therapy, at this time.             Laboratory Chemistry  Inflammation Markers (CRP: Acute Phase) (ESR: Chronic Phase) Lab Results  Component Value Date   CRP 5.3 (H) 12/05/2016   ESRSEDRATE 9 12/05/2016                         Rheumatology Markers Lab Results  Component Value Date   RF 17.9 (H) 12/05/2016   ANA NEG 10/02/2016   LABURIC 2.5 09/18/2016                        Renal Function Markers Lab Results  Component Value Date   BUN 19 01/08/2018   CREATININE 0.89 70/62/3762   BCR NOT APPLICABLE  83/15/1761   GFRAA 89 01/08/2018   GFRNONAA 77 01/08/2018                             Hepatic Function Markers Lab Results  Component Value Date   AST 15 01/08/2018   ALT 12 01/08/2018   ALBUMIN 4.4 12/05/2016   ALKPHOS 59 12/05/2016   LIPASE 107 12/26/2011                        Electrolytes Lab Results  Component Value Date   NA 140 01/08/2018   K 4.5 01/08/2018   CL 105 01/08/2018   CALCIUM 9.7 01/08/2018   MG 2.1 12/05/2016   PHOS 3.0 09/18/2016                        Neuropathy Markers Lab Results  Component Value Date   VITAMINB12 495 12/05/2016                        Bone Pathology Markers Lab Results  Component Value Date   VD25OH 27.1 (L) 09/18/2016   25OHVITD1 49 12/05/2016   25OHVITD2 <1.0 12/05/2016   25OHVITD3 49 12/05/2016                         Coagulation Parameters Lab Results  Component Value Date   INR 0.9 04/16/2013   LABPROT 12.4 04/16/2013   PLT 251 01/08/2018                        Cardiovascular Markers Lab Results  Component Value Date   CKTOTAL 75 04/16/2013   CKMB 0.6 04/16/2013   TROPONINI < 0.02 04/16/2013   HGB 14.5 01/08/2018   HCT 42.7 01/08/2018                         CA Markers No results found for: CEA, CA125, LABCA2                      Note: Lab results reviewed.  Recent Diagnostic Imaging Results  US Renal CLINICAL DATA:  47 year old with bilateral flank pain. Evaluate right renal cyst.  EXAM: RENAL / URINARY TRACT ULTRASOUND COMPLETE  COMPARISON:  12/22/2017 lumbar spine MRI.  Abdominal CT 07/08/2005  FINDINGS: Right Kidney:  Length: 10.4 cm. Echogenicity is within normal limits. Negative for hydronephrosis. There is an exophytic hypoechoic cyst in the upper pole that measures up to 1.2 cm. No suspicious renal lesions.  Left Kidney:  Length: 10.3 cm. Echogenicity within normal limits. No mass or hydronephrosis visualized.  Bladder:  Appears normal for degree of bladder  distention.  IMPRESSION: No acute abnormality.  Negative for hydronephrosis.  Small right renal cyst.  Electronically Signed   By: Markus Daft M.D.   On: 01/21/2018 16:41  Complexity Note: Imaging results reviewed. Results shared with Ms. Elbert, using Layman's terms.                         Meds   Current Outpatient Medications:  .  cyclobenzaprine (FLEXERIL) 10 MG tablet, Take 1 tablet (10 mg total) by mouth at bedtime., Disp: 30 tablet, Rfl: 1 .  DULoxetine (CYMBALTA) 30 MG capsule, Take 1 capsule (30 mg total) by mouth 2 (two) times daily., Disp: 60 capsule, Rfl: 1 .  meloxicam (MOBIC) 15 MG tablet, Take 1 tablet (15 mg total) by mouth daily., Disp: 30 tablet, Rfl: 0 .  [START ON 02/26/2018] oxyCODONE-acetaminophen (PERCOCET) 10-325 MG tablet, Take 1 tablet by mouth every 6 (six) hours as needed for pain., Disp: 120 tablet, Rfl: 0  ROS  Constitutional: Denies any fever or chills Gastrointestinal: No reported hemesis, hematochezia, vomiting, or acute GI distress Musculoskeletal: Denies any acute onset joint swelling, redness, loss of ROM, or weakness Neurological: No reported episodes of acute onset apraxia, aphasia, dysarthria, agnosia, amnesia, paralysis, loss of coordination, or loss of consciousness  Allergies  Ms. Slight is allergic to codeine.  PFSH  Drug: Ms. Olesky  reports that she does not use drugs. Alcohol:  reports that she does not drink alcohol. Tobacco:  reports that she has been smoking cigarettes. She has a 20.00 pack-year smoking history. She has never used smokeless tobacco. Medical:  has a past medical history of Allergy, Anxiety, Chronic bilateral low back pain (07/22/2016), Depression, and Kidney cysts (12/21/2017). Surgical: Ms. Imel  has a past surgical history that includes Abdominal hysterectomy; Tonsillectomy; Cesarean section; Spine surgery; Spine surgery (09/09/2015); and Back surgery. Family: family history includes Arthritis in her father; Breast cancer (age  of onset: 67) in her maternal aunt; Cancer in her maternal aunt; Depression in her sister and sister; Diabetes in her mother; Heart disease in her father and maternal grandfather; Hyperlipidemia in her father; Hypertension in her father and mother; Lung disease in her father; Lymphoma in her father.  Constitutional Exam  General appearance: Well nourished, well developed, and well hydrated. In no apparent acute distress Vitals:   02/11/18 1158  BP: (!) 134/93  Pulse: 73  Resp: 18  Temp: 98.3 F (36.8 C)  TempSrc: Oral  SpO2: 99%  Weight: 180 lb (81.6 kg)  Height: 5' 6"  (1.676 m)   Psych/Mental status: Alert, oriented x 3 (person, place, & time)       Eyes: PERLA Respiratory: No evidence of acute respiratory distress  Lumbar Spine Area Exam  Skin & Axial Inspection: Well healed scar from previous spine surgery detected Alignment: Symmetrical Functional ROM: Unrestricted ROM       Stability: No instability detected Muscle Tone/Strength: Functionally intact. No obvious neuro-muscular anomalies detected. Sensory (Neurological): Unimpaired Palpation: Complains of area being tender to palpation       Provocative Tests: Hyperextension/rotation test:  Positive bilaterally for facet joint pain.  Gait & Posture Assessment  Ambulation: Unassisted Gait: Relatively normal for age and body habitus Posture: WNL   Lower Extremity Exam    Side: Right lower extremity  Side: Left lower extremity  Stability: No instability observed          Stability: No instability observed          Skin & Extremity Inspection: Skin color, temperature, and hair growth are WNL. No peripheral edema or cyanosis. No masses, redness, swelling, asymmetry, or associated skin lesions. No contractures.  Skin & Extremity Inspection: Skin color, temperature, and hair growth are WNL. No peripheral edema or cyanosis. No masses, redness, swelling, asymmetry, or associated skin lesions. No contractures.  Functional ROM:  Unrestricted ROM                  Functional ROM: Unrestricted ROM                  Muscle Tone/Strength: Functionally intact. No obvious neuro-muscular anomalies detected.  Muscle Tone/Strength: Functionally intact. No obvious neuro-muscular anomalies detected.  Sensory (Neurological): Unimpaired  Sensory (Neurological): Unimpaired  Palpation: No palpable anomalies  Palpation: No palpable anomalies   Assessment  Primary Diagnosis & Pertinent Problem List: The primary encounter diagnosis was Lumbar spondylosis. Diagnoses of Lumbar facet syndrome (Bilateral), Musculoskeletal pain, Chronic low back pain (Primary Source of Pain) (midline), Chronic pain syndrome, and Chronic low back pain (Location of Primary Source of Pain) (midline) were also pertinent to this visit.  Status Diagnosis  Persistent Persistent Persistent 1. Lumbar spondylosis   2. Lumbar facet syndrome (Bilateral)   3. Musculoskeletal pain   4. Chronic low back pain (Primary Source of Pain) (midline)   5. Chronic pain syndrome   6. Chronic low back pain (Location of Primary Source of Pain) (midline)     Problems updated and reviewed during this visit: No problems updated. Plan of Care  Pharmacotherapy (Medications Ordered): Meds ordered this encounter  Medications  . cyclobenzaprine (FLEXERIL) 10 MG tablet    Sig: Take 1 tablet (10 mg total) by mouth at bedtime.    Dispense:  30 tablet    Refill:  1    Do not place this medication, or any other prescription from our practice, on "Automatic Refill". Patient may have prescription filled one day early if pharmacy is closed on scheduled refill date.    Order Specific Question:   Supervising Provider    Answer:   Milinda Pointer [phone]  . DULoxetine (CYMBALTA) 30 MG capsule    Sig: Take 1 capsule (30 mg total) by mouth 2 (two) times daily.    Dispense:  60 capsule    Refill:  1    Do not place medication on "Automatic Refill". Fill one day early if pharmacy is closed  on scheduled refill date.    Order Specific Question:   Supervising Provider    Answer:   Milinda Pointer [phone]  . meloxicam (MOBIC) 15 MG tablet    Sig: Take 1 tablet (15 mg total) by mouth daily.    Dispense:  30 tablet    Refill:  0    Do not place medication on "Automatic Refill". Fill one day early if pharmacy is closed on scheduled refill date.    Order Specific Question:   Supervising Provider    Answer:   Milinda Pointer [phone]  . oxyCODONE-acetaminophen (PERCOCET) 10-325 MG tablet    Sig: Take 1 tablet by mouth every 6 (six)  hours as needed for pain.    Dispense:  120 tablet    Refill:  0    Do not place this medication on "Automatic Refill". Patient may have prescription filled one day early if pharmacy is closed on scheduled refill date. Do not fill until:02/26/2018 To last until: 03/28/2018    Order Specific Question:   Supervising Provider    Answer:   Milinda Pointer [phone]   New Prescriptions   No medications on file   Medications administered today: Ayme Short. Fare had no medications administered during this visit. Lab-work, procedure(s), and/or referral(s): Orders Placed This Encounter  Procedures  . Radiofrequency,Lumbar   Imaging and/or referral(s): None  Interventional management options: Planned, scheduled, and/or pending:  Therapeutic left lumbar facet RFA   Considering:  Diagnostic bilateral lumbar facet block Possible bilateral lumbar facet RFA Diagnostic left sided caudal epidural steroid injection + diagnostic epidurogram Possible left-sided Racz procedure Diagnostic left L4-5 interlaminar lumbar epidural steroid injection Diagnosticleft L4-5 transforaminal epidural steroid injection Diagnostic left L4 selective nerve root block Possible left L4 nerve root ganglion RFA Possible lumbar spinal cord stimulator trial   Palliative PRN treatment(s):  Diagnostic bilateral lumbar facet block(NO STEROIDS     Provider-requested follow-up: Return in about 6 weeks (around 03/25/2018) for MedMgmt with Me Dionisio David), in addition, Procedure(w/Sedation), w/ Dr. Dossie Arbour, Left Lumbar RFA.  Future Appointments  Date Time Provider [place]  03/24/2018 11:00 AM Vevelyn Francois, NP [facility] ([place] None   Primary Care Physician: Arnetha Courser, MD Location: [facility] Outpatient Pain Management Facility Note by: Vevelyn Francois NP Date: 02/11/2018; Time: 3:27 PM  Pain Score Disclaimer: We use the NRS-11 scale. This is a self-reported, subjective measurement of pain severity with only modest accuracy. It is used primarily to identify changes within a particular patient. It must be understood that outpatient pain scales are significantly less accurate that those used for research, where they can be applied under ideal controlled circumstances with minimal exposure to variables. In reality, the score is likely to be a combination of pain intensity and pain affect, where pain affect describes the degree of emotional arousal or changes in action readiness caused by the sensory experience of pain. Factors such as social and work situation, setting, emotional state, anxiety levels, expectation, and prior pain experience may influence pain perception and show large inter-individual differences that may also be affected by time variables.  Patient instructions provided during this appointment: Patient Instructions   ____________________________________________________________________________________________  Medication Rules  Applies to: All patients receiving prescriptions (written or electronic).  Pharmacy of record: Pharmacy where electronic prescriptions will be sent. If written prescriptions are taken to a different pharmacy, please inform the nursing staff. The pharmacy listed in the electronic medical record should be the one where you would like electronic prescriptions to be sent.  Prescription  refills: Only during scheduled appointments. Applies to both, written and electronic prescriptions.  NOTE: The following applies primarily to controlled substances (Opioid* Pain Medications).   Patient's responsibilities: 1. Pain Pills: Bring all pain pills to every appointment (except for procedure appointments). 2. Pill Bottles: Bring pills in original pharmacy bottle. Always bring newest bottle. Bring bottle, even if empty. 3. Medication refills: You are responsible for knowing and keeping track of what medications you need refilled. The day before your appointment, write a list of all prescriptions that need to be refilled. Bring that list to your appointment and give it to the admitting nurse. Prescriptions will be written only during appointments. If  you forget a medication, it will not be "Called in", "Faxed", or "electronically sent". You will need to get another appointment to get these prescribed. 4. Prescription Accuracy: You are responsible for carefully inspecting your prescriptions before leaving our office. Have the discharge nurse carefully go over each prescription with you, before taking them home. Make sure that your name is accurately spelled, that your address is correct. Check the name and dose of your medication to make sure it is accurate. Check the number of pills, and the written instructions to make sure they are clear and accurate. Make sure that you are given enough medication to last until your next medication refill appointment. 5. Taking Medication: Take medication as prescribed. Never take more pills than instructed. Never take medication more frequently than prescribed. Taking less pills or less frequently is permitted and encouraged, when it comes to controlled substances (written prescriptions).  6. Inform other Doctors: Always inform, all of your healthcare providers, of all the medications you take. 7. Pain Medication from other Providers: You are not allowed to  accept any additional pain medication from any other Doctor or Healthcare provider. There are two exceptions to this rule. (see below) In the event that you require additional pain medication, you are responsible for notifying us, as stated below. 8. Medication Agreement: You are responsible for carefully reading and following our Medication Agreement. This must be signed before receiving any prescriptions from our practice. Safely store a copy of your signed Agreement. Violations to the Agreement will result in no further prescriptions. (Additional copies of our Medication Agreement are available upon request.) 9. Laws, Rules, & Regulations: All patients are expected to follow all Federal and [employer], Rules, [employer]. Ignorance of the Laws does not constitute a valid excuse. The use of any illegal substances is prohibited. 10. Adopted CDC guidelines & recommendations: Target dosing levels will be at or below 60 MME/day. Use of benzodiazepines** is not recommended.  Exceptions: There are only two exceptions to the rule of not receiving pain medications from other Healthcare Providers. 1. Exception #1 (Emergencies): In the event of an emergency (i.e.: accident requiring emergency care), you are allowed to receive additional pain medication. However, you are responsible for: As soon as you are able, call our office [phone], at any time of the day or night, and leave a message stating your name, the date and nature of the emergency, and the name and dose of the medication prescribed. In the event that your call is answered by a member of our staff, make sure to document and save the date, time, and the name of the person that took your information.  2. Exception #2 (Planned Surgery): In the event that you are scheduled by another doctor or dentist to have any type of surgery or procedure, you are allowed (for a period no longer than 30 days), to receive additional pain medication, for  the acute post-op pain. However, in this case, you are responsible for picking up a copy of our "Post-op Pain Management for Surgeons" handout, and giving it to your surgeon or dentist. This document is available at our office, and does not require an appointment to obtain it. Simply go to our office during business hours (Monday-Thursday from 8:00 AM to 4:00 PM) (Friday 8:00 AM to 12:00 Noon) or if you have a scheduled appointment with [place], prior to your surgery, and ask for it by name. In addition, you will need to provide [place] with your name,  name of your surgeon, type of surgery, and date of procedure or surgery.  *Opioid medications include: morphine, codeine, oxycodone, oxymorphone, hydrocodone, hydromorphone, meperidine, tramadol, tapentadol, buprenorphine, fentanyl, methadone. **Benzodiazepine medications include: diazepam (Valium), alprazolam (Xanax), clonazepam (Klonopine), lorazepam (Ativan), clorazepate (Tranxene), chlordiazepoxide (Librium), estazolam (Prosom), oxazepam (Serax), temazepam (Restoril), triazolam (Halcion) (Last updated: 08/07/2017) ____________________________________________________________________________________________  BMI Assessment: Estimated body mass index is 28.75 kg/m as calculated from the following:   Height as of 01/08/18: 5' 6"  (1.676 m).   Weight as of 01/08/18: 178 lb 1.6 oz (80.8 kg).  BMI interpretation table: BMI level Category Range association with higher incidence of chronic pain  <18 kg/m2 Underweight   18.5-24.9 kg/m2 Ideal body weight   25-29.9 kg/m2 Overweight Increased incidence by 20%  30-34.9 kg/m2 Obese (Class I) Increased incidence by 68%  35-39.9 kg/m2 Severe obesity (Class II) Increased incidence by 136%  >40 kg/m2 Extreme obesity (Class III) Increased incidence by 254%   Patient's current BMI Ideal Body weight  There is no height or weight on file to calculate BMI. Patient weight not recorded   BMI Readings from Last 4 Encounters:   01/08/18 28.75 kg/m  01/05/18 29.05 kg/m  12/29/17 29.05 kg/m  12/10/17 29.05 kg/m   Wt Readings from Last 4 Encounters:  01/08/18 178 lb 1.6 oz (80.8 kg)  01/05/18 180 lb (81.6 kg)  12/29/17 180 lb (81.6 kg)  12/10/17 180 lb (81.6 kg)   Radiofrequency Lesioning Radiofrequency lesioning is a procedure that is performed to relieve pain. The procedure is often used for back, neck, or arm pain. Radiofrequency lesioning involves the use of a machine that creates radio waves to make heat. During the procedure, the heat is applied to the nerve that carries the pain signal. The heat damages the nerve and interferes with the pain signal. Pain relief usually starts about 2 weeks after the procedure and lasts for 6 months to 1 year. Tell a health care provider about:  Any allergies you have.  All medicines you are taking, including vitamins, herbs, eye drops, creams, and over-the-counter medicines.  Any problems you or family members have had with anesthetic medicines.  Any blood disorders you have.  Any surgeries you have had.  Any medical conditions you have.  Whether you are pregnant or may be pregnant. What are the risks? Generally, this is a safe procedure. However, problems may occur, including:  Pain or soreness at the injection site.  Infection at the injection site.  Damage to nerves or blood vessels.  What happens before the procedure?  Ask your health care provider about: ? Changing or stopping your regular medicines. This is especially important if you are taking diabetes medicines or blood thinners. ? Taking medicines such as aspirin and ibuprofen. These medicines can thin your blood. Do not take these medicines before your procedure if your health care provider instructs you not to.  Follow instructions from your health care provider about eating or drinking restrictions.  Plan to have someone take you home after the procedure.  If you go home right after the  procedure, plan to have someone with you for 24 hours. What happens during the procedure?  You will be given one or more of the following: ? A medicine to help you relax (sedative). ? A medicine to numb the area (local anesthetic).  You will be awake during the procedure. You will need to be able to talk with the health care provider during the procedure.  With the help of a type  of X-[place] (fluoroscopy), the health care provider will insert a radiofrequency needle into the area to be treated.  Next, a wire that carries the radio waves (electrode) will be put through the radiofrequency needle. An electrical pulse will be sent through the electrode to verify the correct nerve. You will feel a tingling sensation, and you may have muscle twitching.  Then, the tissue that is around the needle tip will be heated by an electric current that is passed using the radiofrequency machine. This will numb the nerves.  A bandage (dressing) will be put on the insertion area after the procedure is done. The procedure may vary among health care providers and hospitals. What happens after the procedure?  Your blood pressure, heart rate, breathing rate, and blood oxygen level will be monitored often until the medicines you were given have worn off.  Return to your normal activities as directed by your health care provider. This information is not intended to replace advice given to you by your health care provider. Make sure you discuss any questions you have with your health care provider. Document Released: 01/23/2011 Document Revised: 11/02/2015 Document Reviewed: 07/04/2014 Elsevier Interactive Patient Education  [employer].

## 2018-02-11 NOTE — Patient Instructions (Addendum)
____________________________________________________________________________________________  Medication Rules  Applies to: All patients receiving prescriptions (written or electronic).  Pharmacy of record: Pharmacy where electronic prescriptions will be sent. If written prescriptions are taken to a different pharmacy, please inform the nursing staff. The pharmacy listed in the electronic medical record should be the one where you would like electronic prescriptions to be sent.  Prescription refills: Only during scheduled appointments. Applies to both, written and electronic prescriptions.  NOTE: The following applies primarily to controlled substances (Opioid* Pain Medications).   Patient's responsibilities: 1. Pain Pills: Bring all pain pills to every appointment (except for procedure appointments). 2. Pill Bottles: Bring pills in original pharmacy bottle. Always bring newest bottle. Bring bottle, even if empty. 3. Medication refills: You are responsible for knowing and keeping track of what medications you need refilled. The day before your appointment, write a list of all prescriptions that need to be refilled. Bring that list to your appointment and give it to the admitting nurse. Prescriptions will be written only during appointments. If you forget a medication, it will not be "Called in", "Faxed", or "electronically sent". You will need to get another appointment to get these prescribed. 4. Prescription Accuracy: You are responsible for carefully inspecting your prescriptions before leaving our office. Have the discharge nurse carefully go over each prescription with you, before taking them home. Make sure that your name is accurately spelled, that your address is correct. Check the name and dose of your medication to make sure it is accurate. Check the number of pills, and the written instructions to make sure they are clear and accurate. Make sure that you are given enough medication to last  until your next medication refill appointment. 5. Taking Medication: Take medication as prescribed. Never take more pills than instructed. Never take medication more frequently than prescribed. Taking less pills or less frequently is permitted and encouraged, when it comes to controlled substances (written prescriptions).  6. Inform other Doctors: Always inform, all of your healthcare providers, of all the medications you take. 7. Pain Medication from other Providers: You are not allowed to accept any additional pain medication from any other Doctor or Healthcare provider. There are two exceptions to this rule. (see below) In the event that you require additional pain medication, you are responsible for notifying us, as stated below. 8. Medication Agreement: You are responsible for carefully reading and following our Medication Agreement. This must be signed before receiving any prescriptions from our practice. Safely store a copy of your signed Agreement. Violations to the Agreement will result in no further prescriptions. (Additional copies of our Medication Agreement are available upon request.) 9. Laws, Rules, & Regulations: All patients are expected to follow all Federal and State Laws, Statutes, Rules, & Regulations. Ignorance of the Laws does not constitute a valid excuse. The use of any illegal substances is prohibited. 10. Adopted CDC guidelines & recommendations: Target dosing levels will be at or below 60 MME/day. Use of benzodiazepines** is not recommended.  Exceptions: There are only two exceptions to the rule of not receiving pain medications from other Healthcare Providers. 1. Exception #1 (Emergencies): In the event of an emergency (i.e.: accident requiring emergency care), you are allowed to receive additional pain medication. However, you are responsible for: As soon as you are able, call our office [phone], at any time of the day or night, and leave a message stating your name, the  date and nature of the emergency, and the name and dose of the medication   prescribed. In the event that your call is answered by a member of our staff, make sure to document and save the date, time, and the name of the person that took your information.  2. Exception #2 (Planned Surgery): In the event that you are scheduled by another doctor or dentist to have any type of surgery or procedure, you are allowed (for a period no longer than 30 days), to receive additional pain medication, for the acute post-op pain. However, in this case, you are responsible for picking up a copy of our "Post-op Pain Management for Surgeons" handout, and giving it to your surgeon or dentist. This document is available at our office, and does not require an appointment to obtain it. Simply go to our office during business hours (Monday-Thursday from 8:00 AM to 4:00 PM) (Friday 8:00 AM to 12:00 Noon) or if you have a scheduled appointment with [place], prior to your surgery, and ask for it by name. In addition, you will need to provide [place] with your name, name of your surgeon, type of surgery, and date of procedure or surgery.  *Opioid medications include: morphine, codeine, oxycodone, oxymorphone, hydrocodone, hydromorphone, meperidine, tramadol, tapentadol, buprenorphine, fentanyl, methadone. **Benzodiazepine medications include: diazepam (Valium), alprazolam (Xanax), clonazepam (Klonopine), lorazepam (Ativan), clorazepate (Tranxene), chlordiazepoxide (Librium), estazolam (Prosom), oxazepam (Serax), temazepam (Restoril), triazolam (Halcion) (Last updated: 08/07/2017) ____________________________________________________________________________________________  BMI Assessment: Estimated body mass index is 28.75 kg/m as calculated from the following:   Height as of 01/08/18: 5\' 6"  (1.676 m).   Weight as of 01/08/18: 178 lb 1.6 oz (80.8 kg).  BMI interpretation table: BMI level Category Range association with higher incidence of  chronic pain  <18 kg/m2 Underweight   18.5-24.9 kg/m2 Ideal body weight   25-29.9 kg/m2 Overweight Increased incidence by 20%  30-34.9 kg/m2 Obese (Class I) Increased incidence by 68%  35-39.9 kg/m2 Severe obesity (Class II) Increased incidence by 136%  >40 kg/m2 Extreme obesity (Class III) Increased incidence by 254%   Patient's current BMI Ideal Body weight  There is no height or weight on file to calculate BMI. Patient weight not recorded   BMI Readings from Last 4 Encounters:  01/08/18 28.75 kg/m  01/05/18 29.05 kg/m  12/29/17 29.05 kg/m  12/10/17 29.05 kg/m   Wt Readings from Last 4 Encounters:  01/08/18 178 lb 1.6 oz (80.8 kg)  01/05/18 180 lb (81.6 kg)  12/29/17 180 lb (81.6 kg)  12/10/17 180 lb (81.6 kg)   Radiofrequency Lesioning Radiofrequency lesioning is a procedure that is performed to relieve pain. The procedure is often used for back, neck, or arm pain. Radiofrequency lesioning involves the use of a machine that creates radio waves to make heat. During the procedure, the heat is applied to the nerve that carries the pain signal. The heat damages the nerve and interferes with the pain signal. Pain relief usually starts about 2 weeks after the procedure and lasts for 6 months to 1 year. Tell a health care provider about:  Any allergies you have.  All medicines you are taking, including vitamins, herbs, eye drops, creams, and over-the-counter medicines.  Any problems you or family members have had with anesthetic medicines.  Any blood disorders you have.  Any surgeries you have had.  Any medical conditions you have.  Whether you are pregnant or may be pregnant. What are the risks? Generally, this is a safe procedure. However, problems may occur, including:  Pain or soreness at the injection site.  Infection at the injection site.  Damage  to nerves or blood vessels.  What happens before the procedure?  Ask your health care provider about: ? Changing  or stopping your regular medicines. This is especially important if you are taking diabetes medicines or blood thinners. ? Taking medicines such as aspirin and ibuprofen. These medicines can thin your blood. Do not take these medicines before your procedure if your health care provider instructs you not to.  Follow instructions from your health care provider about eating or drinking restrictions.  Plan to have someone take you home after the procedure.  If you go home right after the procedure, plan to have someone with you for 24 hours. What happens during the procedure?  You will be given one or more of the following: ? A medicine to help you relax (sedative). ? A medicine to numb the area (local anesthetic).  You will be awake during the procedure. You will need to be able to talk with the health care provider during the procedure.  With the help of a type of X-[place] (fluoroscopy), the health care provider will insert a radiofrequency needle into the area to be treated.  Next, a wire that carries the radio waves (electrode) will be put through the radiofrequency needle. An electrical pulse will be sent through the electrode to verify the correct nerve. You will feel a tingling sensation, and you may have muscle twitching.  Then, the tissue that is around the needle tip will be heated by an electric current that is passed using the radiofrequency machine. This will numb the nerves.  A bandage (dressing) will be put on the insertion area after the procedure is done. The procedure may vary among health care providers and hospitals. What happens after the procedure?  Your blood pressure, heart rate, breathing rate, and blood oxygen level will be monitored often until the medicines you were given have worn off.  Return to your normal activities as directed by your health care provider. This information is not intended to replace advice given to you by your health care provider. Make sure you  discuss any questions you have with your health care provider. Document Released: 01/23/2011 Document Revised: 11/02/2015 Document Reviewed: 07/04/2014 Elsevier Interactive Patient Education  [employer].

## 2018-02-11 NOTE — Progress Notes (Signed)
Nursing Pain Medication Assessment:  Safety precautions to be maintained throughout the outpatient stay will include: orient to surroundings, keep bed in low position, maintain call bell within reach at all times, provide assistance with transfer out of bed and ambulation.  Medication Inspection Compliance: Pill count conducted under aseptic conditions, in front of the patient. Neither the pills nor the bottle was removed from the patient's sight at any time. Once count was completed pills were immediately returned to the patient in their original bottle.  Medication: Oxycodone IR Pill/Patch Count: 60 of 120 pills remain Pill/Patch Appearance: Markings consistent with prescribed medication Bottle Appearance: Standard pharmacy container. Clearly labeled. Filled Date: 08 / 20 / 2019 Last Medication intake:  Today

## 2018-02-26 ENCOUNTER — Telehealth: Payer: Self-pay | Admitting: Family Medicine

## 2018-02-26 NOTE — Telephone Encounter (Signed)
Copied from [place] [id number]. Topic: Quick Communication - See Telephone Encounter >> Feb 25, 2018  1:00 PM [name] wrote: CRM for notification. See Telephone encounter for: 02/25/18.  Pt says that she is returning call, she received call asking why Dr. Loletha Carrow? Pt says that she seen him before and he has alternatives. Pt says that she has tried different things, she would like to know his opinion.

## 2018-02-26 NOTE — Telephone Encounter (Signed)
So I'm not quite sure what to do with this note I do not know who Dr. Loletha Carrow is, or is she is wanting to see him (or her) or not wanting to see him (or her)

## 2018-02-27 ENCOUNTER — Telehealth: Payer: Self-pay

## 2018-02-27 DIAGNOSIS — M47816 Spondylosis without myelopathy or radiculopathy, lumbar region: Secondary | ICD-10-CM

## 2018-02-27 NOTE — Telephone Encounter (Signed)
Please enter referral

## 2018-02-27 NOTE — Telephone Encounter (Signed)
Left detailed voicemial 

## 2018-02-27 NOTE — Telephone Encounter (Signed)
Patient is returning Jamie's call. Advised the office is closed from 12:30-1:30. Roselyn Reef please contact patient back @ [phone]. She said that she is not explaining this again to [place] and that she wants to speak to [facility] directly.

## 2018-02-27 NOTE — Telephone Encounter (Signed)
Copied from [place] [id number]. Topic: General - Other >> Feb 23, 2018  3:54 PM Janace Aris A wrote: Reason for CRM: Patient wants a referral to see Dr. Loletha [facility] at [facility]. [phone] (fax) who is a anesthesiologist.    Please advise

## 2018-02-27 NOTE — Telephone Encounter (Signed)
Patient called back states Dr. Mirna Mires is a anesthesiologist and she wants 2nd opinion about her back and pain management.  She no longer wants to be on narcotics and wants to see what other alternatives he may have?

## 2018-03-17 ENCOUNTER — Ambulatory Visit: Payer: BLUE CROSS/BLUE SHIELD | Admitting: Family Medicine

## 2018-03-17 ENCOUNTER — Ambulatory Visit
Admission: RE | Admit: 2018-03-17 | Discharge: 2018-03-17 | Disposition: A | Discharge status: Home / Self Care | Payer: BLUE CROSS/BLUE SHIELD | Source: Ambulatory Visit | Attending: Family Medicine | Admitting: Family Medicine

## 2018-03-17 ENCOUNTER — Encounter: Payer: Self-pay | Admitting: Family Medicine

## 2018-03-17 VITALS — BP 122/76 | HR 76 | Temp 98.0°F | Ht 66.0 in | Wt 174.3 lb

## 2018-03-17 DIAGNOSIS — M25461 Effusion, right knee: Secondary | ICD-10-CM | POA: Insufficient documentation

## 2018-03-17 DIAGNOSIS — M25561 Pain in right knee: Secondary | ICD-10-CM

## 2018-03-17 DIAGNOSIS — Z23 Encounter for immunization: Secondary | ICD-10-CM | POA: Diagnosis not present

## 2018-03-17 DIAGNOSIS — M2550 Pain in unspecified joint: Secondary | ICD-10-CM

## 2018-03-17 NOTE — Progress Notes (Signed)
BP 122/76   Pulse 76   Temp 98 F (36.7 C) (Other (Comment))   Ht 5\' 6"  (1.676 m)   Wt 174 lb 4.8 oz (79.1 kg)   SpO2 97%   BMI 28.13 kg/m    Subjective:    Patient ID: [name], female    DOB: 12-15-1970, 47 y.o.   MRN: [id number]  HPI: [name] is a 47 y.o. female  Chief Complaint  Patient presents with  . Joint Pain    athritis right knee pain with weakness    HPI  Here for an acute visit Pain in the right knee, ankles, fingers, neck Right knee is the most bothersome right now, along with lower back (lower back was an issue for a long time) She says "I have always had it in my hands"; she has knots in her hands; her father has osteoarthritis "everywhere" She fell on Saturday; she stepped and her right knee was just weak; she was compensating for her back No previous injuries She has not really done anything for her knee she says She has been taking mobic but that is not helping much She will move it a way and a sharp pain comes in; the right knee is swelling Not red or hot, no hx of gout  Depression screen [facility] 2/9 03/17/2018 03/17/2018 02/11/2018 01/08/2018 01/05/2018  Decreased Interest 0 0 0 0 0  Down, Depressed, Hopeless 3 0 0 0 0  PHQ - 2 Score 3 0 0 0 0  Altered sleeping 1 - - 0 -  Tired, decreased energy 3 - - 0 -  Change in appetite 0 - - 0 -  Feeling bad or failure about yourself  3 - - 0 -  Trouble concentrating 0 - - 0 -  Moving slowly or fidgety/restless 0 - - 0 -  Suicidal thoughts 0 - - 0 -  PHQ-9 Score 10 - - 0 -  Difficult doing work/chores Somewhat difficult - - Not difficult at all -  Some recent data might be hidden   Fall Risk  03/17/2018 02/11/2018 01/08/2018 01/05/2018 12/29/2017  Falls in the past year? Yes No Yes No No  Number falls in past yr: 1 - 1 - -  Injury with Fall? No - No - -  Comment - - - - -  Risk for fall due to : - - - - -  Risk for fall due to: Comment - - - - -  Follow up - - - - -    Relevant past medical, surgical,  family and social history reviewed Past Medical History:  Diagnosis Date  . Allergy   . Anxiety   . Chronic bilateral low back pain 07/22/2016   Started in her 20's; managed by pain clinic  . Depression   . Kidney cysts 12/21/2017   Past Surgical History:  Procedure Laterality Date  . ABDOMINAL HYSTERECTOMY    . BACK SURGERY     two back sx one in [place] and one at [place]  . CESAREAN SECTION    . SPINE SURGERY    . SPINE SURGERY  09/09/2015  . TONSILLECTOMY     Family History  Problem Relation Age of Onset  . Hypertension Mother   . Diabetes Mother   . Hypertension Father   . Hyperlipidemia Father   . Arthritis Father   . Lung disease Father   . Heart disease Father   . Lymphoma Father   . Cancer Maternal  Aunt        breast  . Breast cancer Maternal Aunt 34  . Depression Sister   . Heart disease Maternal Grandfather   . Depression Sister   . Stroke Neg Hx    Social History   Tobacco Use  . Smoking status: Current Every Day Smoker    Packs/day: 1.00    Years: 20.00    Pack years: 20.00    Types: Cigarettes  . Smokeless tobacco: Never Used  Substance Use Topics  . Alcohol use: No  . Drug use: No     [facility] Visit from 03/17/2018 in [facility]  AUDIT-C Score  0      Interim medical history since last visit reviewed. Allergies and medications reviewed  Review of Systems Per HPI unless specifically indicated above     Objective:    BP 122/76   Pulse 76   Temp 98 F (36.7 C) (Other (Comment))   Ht 5\' 6"  (1.676 m)   Wt 174 lb 4.8 oz (79.1 kg)   SpO2 97%   BMI 28.13 kg/m   Wt Readings from Last 3 Encounters:  03/17/18 174 lb 4.8 oz (79.1 kg)  02/11/18 180 lb (81.6 kg)  01/08/18 178 lb 1.6 oz (80.8 kg)    Physical Exam  Constitutional: She appears well-developed and well-nourished.  HENT:  Mouth/Throat: Mucous membranes are normal.  Eyes: EOM are normal. No scleral icterus.  Cardiovascular: Normal rate.  Pulmonary/Chest:  Effort normal.  Musculoskeletal:       Right knee: She exhibits decreased range of motion, swelling and effusion (small). She exhibits no erythema. No tenderness found.       Right hand: She exhibits normal range of motion, no tenderness and no deformity.       Left hand: She exhibits normal range of motion, no tenderness and no deformity.  Psychiatric: She has a normal mood and affect. Her behavior is normal.      Assessment & Plan:   Problem List Items Addressed This Visit    None    Visit Diagnoses    Acute pain of right knee    -  Primary   discussed ddx; do not think gout or septic joint; will start with plain films and labs; see AVS   Relevant Orders   DG Knee Complete 4 Views Right (Completed)   Arthralgia of multiple sites       will start with labs; referral if indicated; conservative management; no pain medicines (narcotics) from this office   Relevant Orders   Uric acid (Completed)   VITAMIN D 25 Hydroxy (Vit-D Deficiency, Fractures) (Completed)   ANA,IFA RA Diag Pnl w/rflx Tit/Patn (Completed)   CBC with Differential/Platelet (Completed)   C-reactive protein (Completed)   Need for influenza vaccination       Relevant Orders   Flu Vaccine QUAD 6+ mos PF IM (Fluarix Quad PF) (Completed)       Follow up plan: No follow-ups on file.  An after-visit summary was printed and given to the patient at [place].  Please see the patient instructions which may contain other information and recommendations beyond what is mentioned above in the assessment and plan.  No orders of the defined types were placed in this encounter.   Orders Placed This Encounter  Procedures  . DG Knee Complete 4 Views Right  . Flu Vaccine QUAD 6+ mos PF IM (Fluarix Quad PF)  . Uric acid  . VITAMIN D 25 Hydroxy (Vit-D Deficiency,  Fractures)  . ANA,IFA RA Diag Pnl w/rflx Tit/Patn  . CBC with Differential/Platelet  . C-reactive protein

## 2018-03-17 NOTE — Patient Instructions (Signed)
Tylenol can be taken up to 3,000 mg total per 24 hours Continue the meloxicam Try ice on the site 15-20 minutes, cloth between the ice and the skin; 3-4 times a day Please have xrays done across the street We'll get labs If you have not heard anything from my staff in a week about any orders/referrals/studies from today, please contact us here to follow-up [phone] We'll have you see the rheumatologist or the orthopaedist Try turmeric as a natural anti-inflammatory (for pain and arthritis). It comes in capsules where you buy aspirin and fish oil, but also as a spice where you buy pepper and garlic powder.

## 2018-03-18 ENCOUNTER — Encounter: Payer: Self-pay | Admitting: Family Medicine

## 2018-03-18 DIAGNOSIS — M25461 Effusion, right knee: Secondary | ICD-10-CM | POA: Insufficient documentation

## 2018-03-18 LAB — CBC WITH DIFFERENTIAL/PLATELET
BASOS ABS: 29 {cells}/uL (ref 0–200)
BASOS PCT: 0.4 %
EOS ABS: 187 {cells}/uL (ref 15–500)
Eosinophils Relative: 2.6 %
HCT: 43.6 % (ref 35.0–45.0)
HEMOGLOBIN: 14.6 g/dL (ref 11.7–15.5)
Lymphs Abs: 2592 cells/uL (ref 850–3900)
MCH: 29.4 pg (ref 27.0–33.0)
MCHC: 33.5 g/dL (ref 32.0–36.0)
MCV: 87.7 fL (ref 80.0–100.0)
MONOS PCT: 5.3 %
MPV: 10.7 fL (ref 7.5–12.5)
NEUTROS ABS: 4010 {cells}/uL (ref 1500–7800)
NEUTROS PCT: 55.7 %
Platelets: 213 10*3/uL (ref 140–400)
RBC: 4.97 10*6/uL (ref 3.80–5.10)
RDW: 12.6 % (ref 11.0–15.0)
TOTAL LYMPHOCYTE: 36 %
WBC mixed population: 382 cells/uL (ref 200–950)
WBC: 7.2 10*3/uL (ref 3.8–10.8)

## 2018-03-18 LAB — C-REACTIVE PROTEIN: CRP: 4.8 mg/L (ref <8.0)

## 2018-03-18 LAB — ANA,IFA RA DIAG PNL W/RFLX TIT/PATN
Anti Nuclear Antibody(ANA): NEGATIVE
Rhuematoid fact SerPl-aCnc: 15 IU/mL — ABNORMAL HIGH (ref <14)

## 2018-03-18 LAB — URIC ACID: Uric Acid, Serum: 2.6 mg/dL (ref 2.5–7.0)

## 2018-03-18 LAB — VITAMIN D 25 HYDROXY (VIT D DEFICIENCY, FRACTURES): Vit D, 25-Hydroxy: 27 ng/mL — ABNORMAL LOW (ref 30–100)

## 2018-03-23 ENCOUNTER — Telehealth: Payer: Self-pay

## 2018-03-23 DIAGNOSIS — M0580 Other rheumatoid arthritis with rheumatoid factor of unspecified site: Secondary | ICD-10-CM

## 2018-03-23 NOTE — Telephone Encounter (Signed)
Copied from [place] [id number]. Topic: Referral - Request for Referral >> Mar 23, 2018  3:09 PM Scherrie Gerlach wrote: Has patient seen PCP for this complaint? yes *If NO, is insurance requiring patient see PCP for this issue before PCP can refer them? Referral for which specialty: rheumatology Preferred provider/office: anyone Reason for referral: pt states she had labs done that shows she has this issue and would like to see a specialist

## 2018-03-23 NOTE — Telephone Encounter (Signed)
Yes, please see my note under lab results:  Notes recorded by Sibyl Parr, CMA on 03/19/2018 at 11:17 AM EDT Left detailed VM. ------  Notes recorded by Arnetha Courser, MD on 03/19/2018 at 7:57 AM EDT Joelene Millin, please let the patient know other lab results, plus her rheumatoid factor is positive; please REFER to rheumatologist (positive rheumatoid factor)

## 2018-03-24 ENCOUNTER — Ambulatory Visit: Payer: BLUE CROSS/BLUE SHIELD | Attending: Nurse Practitioner | Admitting: Nurse Practitioner

## 2018-03-24 ENCOUNTER — Encounter: Payer: Self-pay | Admitting: Nurse Practitioner

## 2018-03-24 ENCOUNTER — Other Ambulatory Visit: Payer: Self-pay

## 2018-03-24 DIAGNOSIS — M47816 Spondylosis without myelopathy or radiculopathy, lumbar region: Secondary | ICD-10-CM

## 2018-03-24 DIAGNOSIS — M7918 Myalgia, other site: Secondary | ICD-10-CM

## 2018-03-24 DIAGNOSIS — G8929 Other chronic pain: Secondary | ICD-10-CM

## 2018-03-24 DIAGNOSIS — M5442 Lumbago with sciatica, left side: Secondary | ICD-10-CM

## 2018-03-24 DIAGNOSIS — G894 Chronic pain syndrome: Secondary | ICD-10-CM

## 2018-03-24 MED ORDER — OXYCODONE-ACETAMINOPHEN 10-325 MG PO TABS: 1.0000 | ORAL_TABLET | Freq: Four times a day (QID) | ORAL | 0 refills | Status: DC | PRN

## 2018-03-24 NOTE — Patient Instructions (Addendum)
____________________________________________________________________________________________  Medication Rules  Applies to: All patients receiving prescriptions (written or electronic).  Pharmacy of record: Pharmacy where electronic prescriptions will be sent. If written prescriptions are taken to a different pharmacy, please inform the nursing staff. The pharmacy listed in the electronic medical record should be the one where you would like electronic prescriptions to be sent.  Prescription refills: Only during scheduled appointments. Applies to both, written and electronic prescriptions.  NOTE: The following applies primarily to controlled substances (Opioid* Pain Medications).   Patient's responsibilities: 1. Pain Pills: Bring all pain pills to every appointment (except for procedure appointments). 2. Pill Bottles: Bring pills in original pharmacy bottle. Always bring newest bottle. Bring bottle, even if empty. 3. Medication refills: You are responsible for knowing and keeping track of what medications you need refilled. The day before your appointment, write a list of all prescriptions that need to be refilled. Bring that list to your appointment and give it to the admitting nurse. Prescriptions will be written only during appointments. If you forget a medication, it will not be "Called in", "Faxed", or "electronically sent". You will need to get another appointment to get these prescribed. 4. Prescription Accuracy: You are responsible for carefully inspecting your prescriptions before leaving our office. Have the discharge nurse carefully go over each prescription with you, before taking them home. Make sure that your name is accurately spelled, that your address is correct. Check the name and dose of your medication to make sure it is accurate. Check the number of pills, and the written instructions to make sure they are clear and accurate. Make sure that you are given enough medication to last  until your next medication refill appointment. 5. Taking Medication: Take medication as prescribed. Never take more pills than instructed. Never take medication more frequently than prescribed. Taking less pills or less frequently is permitted and encouraged, when it comes to controlled substances (written prescriptions).  6. Inform other Doctors: Always inform, all of your healthcare providers, of all the medications you take. 7. Pain Medication from other Providers: You are not allowed to accept any additional pain medication from any other Doctor or Healthcare provider. There are two exceptions to this rule. (see below) In the event that you require additional pain medication, you are responsible for notifying us, as stated below. 8. Medication Agreement: You are responsible for carefully reading and following our Medication Agreement. This must be signed before receiving any prescriptions from our practice. Safely store a copy of your signed Agreement. Violations to the Agreement will result in no further prescriptions. (Additional copies of our Medication Agreement are available upon request.) 9. Laws, Rules, & Regulations: All patients are expected to follow all Federal and State Laws, Statutes, Rules, & Regulations. Ignorance of the Laws does not constitute a valid excuse. The use of any illegal substances is prohibited. 10. Adopted CDC guidelines & recommendations: Target dosing levels will be at or below 60 MME/day. Use of benzodiazepines** is not recommended.  Exceptions: There are only two exceptions to the rule of not receiving pain medications from other Healthcare Providers. 1. Exception #1 (Emergencies): In the event of an emergency (i.e.: accident requiring emergency care), you are allowed to receive additional pain medication. However, you are responsible for: As soon as you are able, call our office [phone], at any time of the day or night, and leave a message stating your name, the  date and nature of the emergency, and the name and dose of the medication   prescribed. In the event that your call is answered by a member of our staff, make sure to document and save the date, time, and the name of the person that took your information.  2. Exception #2 (Planned Surgery): In the event that you are scheduled by another doctor or dentist to have any type of surgery or procedure, you are allowed (for a period no longer than 30 days), to receive additional pain medication, for the acute post-op pain. However, in this case, you are responsible for picking up a copy of our "Post-op Pain Management for Surgeons" handout, and giving it to your surgeon or dentist. This document is available at our office, and does not require an appointment to obtain it. Simply go to our office during business hours (Monday-Thursday from 8:00 AM to 4:00 PM) (Friday 8:00 AM to 12:00 Noon) or if you have a scheduled appointment with us, prior to your surgery, and ask for it by name. In addition, you will need to provide us with your name, name of your surgeon, type of surgery, and date of procedure or surgery.  *Opioid medications include: morphine, codeine, oxycodone, oxymorphone, hydrocodone, hydromorphone, meperidine, tramadol, tapentadol, buprenorphine, fentanyl, methadone. **Benzodiazepine medications include: diazepam (Valium), alprazolam (Xanax), clonazepam (Klonopine), lorazepam (Ativan), clorazepate (Tranxene), chlordiazepoxide (Librium), estazolam (Prosom), oxazepam (Serax), temazepam (Restoril), triazolam (Halcion) (Last updated: 08/07/2017) ____________________________________________________________________________________________    

## 2018-03-24 NOTE — Progress Notes (Signed)
Nursing Pain Medication Assessment:  Safety precautions to be maintained throughout the outpatient stay will include: orient to surroundings, keep bed in low position, maintain call bell within reach at all times, provide assistance with transfer out of bed and ambulation.  Medication Inspection Compliance: Pill count conducted under aseptic conditions, in front of the patient. Neither the pills nor the bottle was removed from the patient's sight at any time. Once count was completed pills were immediately returned to the patient in their original bottle.  Medication: Oxycodone/APAP Pill/Patch Count: 19 of 120 pills remain Pill/Patch Appearance: Markings consistent with prescribed medication Bottle Appearance: Standard pharmacy container. Clearly labeled. Filled Date: 09/ 19/ 2019 Last Medication intake:  Today

## 2018-03-24 NOTE — Progress Notes (Signed)
Patient's Name: [name]  MRN: [id number]  Referring Provider: Arnetha Courser, MD  DOB: 04/05/1971  PCP: Arnetha Courser, MD  DOS: 03/24/2018  Note by: Vevelyn Francois NP  Service setting: Ambulatory outpatient  Specialty: Interventional Pain Management  Location: [name] (AMB) Pain Management Facility    Patient type: Established    Primary Reason(s) for Visit: Encounter for prescription drug management. (Level of risk: moderate)  CC: Back Pain (mid lower) and Knee Pain (Rhuemoid arthritis)  HPI  [name] is a 47 y.o. year old, female patient, who comes today for a medication management evaluation. She has Adjustment disorder with depressed mood; Stressful life events affecting family and household; Vitamin D deficiency; Lumbosacral spondylosis without myelopathy; Lumbar central spinal stenosis (L4-5); Breast cancer screening; Bladder irritation; Chronic low back pain (Primary Source of Pain) (midline); Arthralgia of multiple joints; Chronic, continuous use of opioids; Elevated rheumatoid factor; Chronic pain syndrome; Long term (current) use of opiate analgesic; Long term prescription opiate use; Opiate use (85.5 MME/Day); Chronic hip pain (Secondary source of pain) (Left); Failed back surgical syndrome; Discogenic low back pain; Lumbar discogenic pain syndrome; Chronic sacroiliac joint pain (Left); Chronic musculoskeletal pain; Lumbar facet syndrome (Bilateral); Lumbar spondylosis; Neurogenic pain; Depression; Acute postoperative pain; Trigger point with back pain (Left); Tobacco use; Preventative health care; Cyst of right kidney; and Knee effusion, right on their problem list. Her primarily concern today is the Back Pain (mid lower) and Knee Pain (Rhuemoid arthritis)  Pain Assessment: Location: Mid Back Radiating: denies Onset: More than a month ago Duration: Chronic pain Quality: Aching Severity: (P) 3 /10 (subjective, self-reported pain score)  Note: Reported level is compatible with  observation.                          Effect on ADL: "I am limited to anything I do" Timing: Constant Modifying factors: heat, baths meds, position changes BP: 136/82  HR: (!) 105  [name] was last scheduled for an appointment on 02/11/2018 for medication management. During today's appointment we reviewed [name]'s chronic pain status, as well as her outpatient medication regimen.  She admits that she is having right knee pain.  She states she had lab work completed and was diagnosed with rheumatoid arthritis.  She denies any family history.  She admits that she has pain in multiple joints.  She admits that she is not active and has not completed any recent physical therapy or aqua therapy.  The patient  reports that she does not use drugs. Her body mass index is 29.05 kg/m.  Further details on both, my assessment(s), as well as the proposed treatment plan, please see below.  Controlled Substance Pharmacotherapy Assessment REMS (Risk Evaluation and Mitigation Strategy)  Analgesic:Oxycodone/acetaminophenIR 10/384m every 6 hours (42mdayof oxycodone) (60MME/day) MME/day:606may TicDewayne ShorterN  03/24/2018 11:19 AM  Signed Nursing Pain Medication Assessment:  Safety precautions to be maintained throughout the outpatient stay will include: orient to surroundings, keep bed in low position, maintain call bell within reach at all times, provide assistance with transfer out of bed and ambulation.  Medication Inspection Compliance: Pill count conducted under aseptic conditions, in front of the patient. Neither the pills nor the bottle was removed from the patient's sight at any time. Once count was completed pills were immediately returned to the patient in their original bottle.  Medication: Oxycodone/APAP Pill/Patch Count: 19 of 120 pills remain Pill/Patch Appearance: Markings consistent with prescribed medication Bottle  Appearance: Standard pharmacy container. Clearly labeled. Filled  Date: 09/ 19/ 2019 Last Medication intake:  Today   Pharmacokinetics: Liberation and absorption (onset of action): WNL Distribution (time to peak effect): WNL Metabolism and excretion (duration of action): WNL         Pharmacodynamics: Desired effects: Analgesia: [name] reports >50% benefit. Functional ability: Patient reports that medication allows her to accomplish basic ADLs Clinically meaningful improvement in function (CMIF): Sustained CMIF goals met Perceived effectiveness: Described as relatively effective, allowing for increase in activities of daily living (ADL) Undesirable effects: Side-effects or Adverse reactions: None reported Monitoring: [place] PMP: Online review of the past 49-monthperiod conducted. Compliant with practice rules and regulations Last UDS on record: Summary  Date Value Ref Range Status  12/10/2017 FINAL  Final    Comment:    ==================================================================== TOXASSURE SELECT 13 (MW) ==================================================================== Test                             Result       Flag       Units Drug Present and Declared for Prescription Verification   Oxycodone                      2088         EXPECTED   ng/mg creat   Oxymorphone                    1319         EXPECTED   ng/mg creat   Noroxycodone                   4143         EXPECTED   ng/mg creat   Noroxymorphone                 300          EXPECTED   ng/mg creat    Sources of oxycodone are scheduled prescription medications.    Oxymorphone, noroxycodone, and noroxymorphone are expected    metabolites of oxycodone. Oxymorphone is also available as a    scheduled prescription medication. ==================================================================== Test                      Result    Flag   Units      Ref Range   Creatinine              215              mg/dL       >=20 ==================================================================== Declared Medications:  The flagging and interpretation on this report are based on the  following declared medications.  Unexpected results may arise from  inaccuracies in the declared medications.  **Note: The testing scope of this panel includes these medications:  Oxycodone (Oxycodone Acetaminophen)  **Note: The testing scope of this panel does not include following  reported medications:  Acetaminophen (Oxycodone Acetaminophen)  Cyclobenzaprine  Duloxetine  Meloxicam ==================================================================== For clinical consultation, please call ([phone] ====================================================================    UDS interpretation: Compliant          Medication Assessment Form: Reviewed. Patient indicates being compliant with therapy Treatment compliance: Compliant Risk Assessment Profile: Aberrant behavior: See prior evaluations. None observed or detected today Comorbid factors increasing risk of overdose: See prior notes. No additional risks detected today Opioid risk tool (ORT) (Total Score): 1 Personal History of  Substance Abuse (SUD-Substance use disorder):  Alcohol: Negative  Illegal Drugs: Negative  Rx Drugs: Negative  ORT Risk Level calculation: Low Risk Risk of substance use disorder (SUD): Moderate-to-High Opioid Risk Tool - 03/24/18 1117      Family History of Substance Abuse   Alcohol  Negative    Illegal Drugs  Negative    Rx Drugs  Negative      Personal History of Substance Abuse   Alcohol  Negative    Illegal Drugs  Negative    Rx Drugs  Negative      Age   Age between 11-45 years   No      History of Preadolescent Sexual Abuse   History of Preadolescent Sexual Abuse  Negative or Female      Psychological Disease   Psychological Disease  Negative    Depression  Positive      Total Score   Opioid Risk Tool Scoring  1     Opioid Risk Interpretation  Low Risk      ORT Scoring interpretation table:  Score <3 = Low Risk for SUD  Score between 4-7 = Moderate Risk for SUD  Score >8 = High Risk for Opioid Abuse   Risk Mitigation Strategies:  Patient Counseling: Covered Patient-Prescriber Agreement (PPA): Present and active  Notification to other healthcare providers: Done  Pharmacologic Plan: No change in therapy, at this time.             Laboratory Chemistry  Inflammation Markers (CRP: Acute Phase) (ESR: Chronic Phase) Lab Results  Component Value Date   CRP 4.8 03/17/2018   ESRSEDRATE 9 12/05/2016                         Rheumatology Markers Lab Results  Component Value Date   RF 15 (H) 03/17/2018   ANA NEGATIVE 03/17/2018   LABURIC 2.6 03/17/2018                        Renal Function Markers Lab Results  Component Value Date   BUN 19 01/08/2018   CREATININE 0.89 37/02/6437   BCR NOT APPLICABLE 38/18/4037   GFRAA 89 01/08/2018   GFRNONAA 77 01/08/2018                             Hepatic Function Markers Lab Results  Component Value Date   AST 15 01/08/2018   ALT 12 01/08/2018   ALBUMIN 4.4 12/05/2016   ALKPHOS 59 12/05/2016   LIPASE 107 12/26/2011                        Electrolytes Lab Results  Component Value Date   NA 140 01/08/2018   K 4.5 01/08/2018   CL 105 01/08/2018   CALCIUM 9.7 01/08/2018   MG 2.1 12/05/2016   PHOS 3.0 09/18/2016                        Neuropathy Markers Lab Results  Component Value Date   VOHKGOVP03 403 12/05/2016                        CNS Tests No results found for: COLORCSF, APPEARCSF, RBCCOUNTCSF, WBCCSF, POLYSCSF, LYMPHSCSF, EOSCSF, PROTEINCSF, GLUCCSF, JCVIRUS, CSFOLI, IGGCSF  Bone Pathology Markers Lab Results  Component Value Date   VD25OH 27 (L) 03/17/2018   25OHVITD1 49 12/05/2016   25OHVITD2 <1.0 12/05/2016   25OHVITD3 49 12/05/2016                         Coagulation Parameters Lab Results   Component Value Date   INR 0.9 04/16/2013   LABPROT 12.4 04/16/2013   PLT 213 03/17/2018                        Cardiovascular Markers Lab Results  Component Value Date   CKTOTAL 75 04/16/2013   CKMB 0.6 04/16/2013   TROPONINI < 0.02 04/16/2013   HGB 14.6 03/17/2018   HCT 43.6 03/17/2018                         CA Markers No results found for: CEA, CA125, LABCA2                      Note: Lab results reviewed.  Recent Diagnostic Imaging Results  DG Knee Complete 4 Views Right CLINICAL DATA:  47 year old female post fall 3 days ago. Pain anterior right knee. Initial encounter.  EXAM: RIGHT KNEE - COMPLETE 4+ VIEW  COMPARISON:  None.  FINDINGS: No fracture or dislocation. Small suprapatellar joint effusion. No significant joint space narrowing.  IMPRESSION: 1.  No fracture or dislocation. 2. Small suprapatellar joint effusion.  Electronically Signed   By: Genia Del M.D.   On: 03/17/2018 19:44  Complexity Note: Imaging results reviewed. Results shared with [name], using Layman's terms.                         Meds   Current Outpatient Medications:  .  cyclobenzaprine (FLEXERIL) 10 MG tablet, Take 1 tablet (10 mg total) by mouth at bedtime., Disp: 30 tablet, Rfl: 1 .  DULoxetine (CYMBALTA) 30 MG capsule, Take 1 capsule (30 mg total) by mouth 2 (two) times daily., Disp: 60 capsule, Rfl: 1 .  meloxicam (MOBIC) 15 MG tablet, Take 1 tablet (15 mg total) by mouth daily., Disp: 30 tablet, Rfl: 0 .  [START ON 03/28/2018] oxyCODONE-acetaminophen (PERCOCET) 10-325 MG tablet, Take 1 tablet by mouth every 6 (six) hours as needed for pain., Disp: 120 tablet, Rfl: 0  ROS  Constitutional: Denies any fever or chills Gastrointestinal: No reported hemesis, hematochezia, vomiting, or acute GI distress Musculoskeletal: Denies any acute onset joint swelling, redness, loss of ROM, or weakness Neurological: No reported episodes of acute onset apraxia, aphasia, dysarthria,  agnosia, amnesia, paralysis, loss of coordination, or loss of consciousness  Allergies  [name] is allergic to codeine.  PFSH  Drug: [name]  reports that she does not use drugs. Alcohol:  reports that she does not drink alcohol. Tobacco:  reports that she has been smoking cigarettes. She has a 20.00 pack-year smoking history. She has never used smokeless tobacco. Medical:  has a past medical history of Allergy, Anxiety, Arthritis, Chronic bilateral low back pain (07/22/2016), Depression, Kidney cysts (12/21/2017), and Rheumatoid arthritis ([place]). Surgical: [name]  has a past surgical history that includes Abdominal hysterectomy; Tonsillectomy; Cesarean section; Spine surgery; Spine surgery (09/09/2015); and Back surgery. Family: family history includes Arthritis in her father; Breast cancer (age of onset: 98) in her maternal aunt; Cancer in her maternal aunt; Depression in her sister and  sister; Diabetes in her mother; Heart disease in her father and maternal grandfather; Hyperlipidemia in her father; Hypertension in her father and mother; Lung disease in her father; Lymphoma in her father.  Constitutional Exam  General appearance: Well nourished, well developed, and well hydrated. In no apparent acute distress Vitals:   03/24/18 1105  BP: 136/82  Pulse: (!) 105  Temp: 98 F (36.7 C)  SpO2: 98%  Weight: 180 lb (81.6 kg)  Height: 5' 6"  (1.676 m)   BMI Assessment: Estimated body mass index is 29.05 kg/m as calculated from the following:   Height as of this encounter: 5' 6"  (1.676 m).   Weight as of this encounter: 180 lb (81.6 kg). Psych/Mental status: Alert, oriented x 3 (person, place, & time)       Eyes: PERLA Respiratory: No evidence of acute respiratory distress  Lumbar Spine Area Exam  Skin & Axial Inspection: No masses, redness, or swelling Alignment: Symmetrical Functional ROM: Unrestricted ROM       Stability: No instability detected Muscle Tone/Strength: Functionally  intact. No obvious neuro-muscular anomalies detected. Sensory (Neurological): Unimpaired Palpation: No palpable anomalies       Provocative Tests: Hyperextension/rotation test: deferred today       Lumbar quadrant test (Kemp's test): deferred today       Lateral bending test: deferred today       Patrick's Maneuver: deferred today                   FABER test: deferred today                   S-I anterior distraction/compression test: deferred today         S-I lateral compression test: deferred today         S-I Thigh-thrust test: deferred today         S-I Gaenslen's test: deferred today          Gait & Posture Assessment  Ambulation: Unassisted Gait: Relatively normal for age and body habitus Posture: WNL   Lower Extremity Exam    Side: Right lower extremity  Side: Left lower extremity  Stability: No instability observed          Stability: No instability observed          Skin & Extremity Inspection: Edema  Skin & Extremity Inspection:   Varicose veins  Functional ROM: Adequate ROM                  Functional ROM: Adequate ROM                  Muscle Tone/Strength: Functionally intact. No obvious neuro-muscular anomalies detected.  Muscle Tone/Strength: Functionally intact. No obvious neuro-muscular anomalies detected.  Sensory (Neurological): Unimpaired  Sensory (Neurological): Unimpaired  Palpation: Non-tender  Palpation: Non-tender   Assessment  Primary Diagnosis & Pertinent Problem List: Diagnoses of Lumbar spondylosis, Lumbar facet syndrome (Bilateral), Musculoskeletal pain, Chronic low back pain (Primary Source of Pain) (midline), Chronic pain syndrome, and Chronic low back pain (Location of Primary Source of Pain) (midline) were pertinent to this visit.  Status Diagnosis  Controlled Controlled Controlled 1. Lumbar spondylosis   2. Lumbar facet syndrome (Bilateral)   3. Musculoskeletal pain   4. Chronic low back pain (Primary Source of Pain) (midline)   5. Chronic  pain syndrome   6. Chronic low back pain (Location of Primary Source of Pain) (midline)     Problems updated and reviewed during this  visit: No problems updated. Plan of Care  Pharmacotherapy (Medications Ordered): Meds ordered this encounter  Medications  . oxyCODONE-acetaminophen (PERCOCET) 10-325 MG tablet    Sig: Take 1 tablet by mouth every 6 (six) hours as needed for pain.    Dispense:  120 tablet    Refill:  0    Do not place this medication on "Automatic Refill". Patient may have prescription filled one day early if pharmacy is closed on scheduled refill date. Do not fill until:02/26/2018 To last until: 03/28/2018    Order Specific Question:   Supervising Provider    Answer:   Milinda Pointer [phone]   New Prescriptions   No medications on file   Medications administered today: [name] had no medications administered during this visit. Lab-work, procedure(s), and/or referral(s): No orders of the defined types were placed in this encounter.  Imaging and/or referral(s): None  Interventional therapies: Planned, scheduled, and/or pending:   Not at this time.  Declined refills on meloxicam Cymbalta and cyclobenzaprine at this time.  Consider aqua therapy.   Considering:  Diagnostic bilateral lumbar facet block Possible bilateral lumbar facet RFA Diagnostic left sided caudal epidural steroid injection + diagnostic epidurogram Possible left-sided Racz procedure Diagnostic left L4-5 interlaminar lumbar epidural steroid injection Diagnosticleft L4-5 transforaminal epidural steroid injection Diagnostic left L4 selective nerve root block Possible left L4 nerve root ganglion RFA Possible lumbar spinal cord stimulator trial   Palliative PRN treatment(s):  Diagnostic bilateral lumbar facet block(NO STEROIDS    Provider-requested follow-up: Return in about 4 weeks (around 04/21/2018) for MedMgmt.  Future Appointments  Date Time Provider [place]   04/22/2018  1:30 PM Vevelyn Francois, NP [facility] None   Primary Care Physician: Arnetha Courser, MD Location: [facility] Outpatient Pain Management Facility Note by: Vevelyn Francois NP Date: 03/24/2018; Time: 12:00 PM  Pain Score Disclaimer: We use the NRS-11 scale. This is a self-reported, subjective measurement of pain severity with only modest accuracy. It is used primarily to identify changes within a particular patient. It must be understood that outpatient pain scales are significantly less accurate that those used for research, where they can be applied under ideal controlled circumstances with minimal exposure to variables. In reality, the score is likely to be a combination of pain intensity and pain affect, where pain affect describes the degree of emotional arousal or changes in action readiness caused by the sensory experience of pain. Factors such as social and work situation, setting, emotional state, anxiety levels, expectation, and prior pain experience may influence pain perception and show large inter-individual differences that may also be affected by time variables.  Patient instructions provided during this appointment: Patient Instructions  __________________________________________________________________________________________________  Medication Rules  Applies to: All patients receiving prescriptions (written or electronic).  Pharmacy of record: Pharmacy where electronic prescriptions will be sent. If written prescriptions are taken to a different pharmacy, please inform the nursing staff. The pharmacy listed in the electronic medical record should be the one where you would like electronic prescriptions to be sent.  Prescription refills: Only during scheduled appointments. Applies to both, written and electronic prescriptions.  NOTE: The following applies primarily to controlled substances (Opioid* Pain Medications).   Patient's responsibilities: 1. Pain Pills: Bring  all pain pills to every appointment (except for procedure appointments). 2. Pill Bottles: Bring pills in original pharmacy bottle. Always bring newest bottle. Bring bottle, even if empty. 3. Medication refills: You are responsible for knowing and keeping track of what medications you need  refilled. The day before your appointment, write a list of all prescriptions that need to be refilled. Bring that list to your appointment and give it to the admitting nurse. Prescriptions will be written only during appointments. If you forget a medication, it will not be "Called in", "Faxed", or "electronically sent". You will need to get another appointment to get these prescribed. 4. Prescription Accuracy: You are responsible for carefully inspecting your prescriptions before leaving our office. Have the discharge nurse carefully go over each prescription with you, before taking them home. Make sure that your name is accurately spelled, that your address is correct. Check the name and dose of your medication to make sure it is accurate. Check the number of pills, and the written instructions to make sure they are clear and accurate. Make sure that you are given enough medication to last until your next medication refill appointment. 5. Taking Medication: Take medication as prescribed. Never take more pills than instructed. Never take medication more frequently than prescribed. Taking less pills or less frequently is permitted and encouraged, when it comes to controlled substances (written prescriptions).  6. Inform other Doctors: Always inform, all of your healthcare providers, of all the medications you take. 7. Pain Medication from other Providers: You are not allowed to accept any additional pain medication from any other Doctor or Healthcare provider. There are two exceptions to this rule. (see below) In the event that you require additional pain medication, you are responsible for notifying us, as stated  below. 8. Medication Agreement: You are responsible for carefully reading and following our Medication Agreement. This must be signed before receiving any prescriptions from our practice. Safely store a copy of your signed Agreement. Violations to the Agreement will result in no further prescriptions. (Additional copies of our Medication Agreement are available upon request.) 9. Laws, Rules, & Regulations: All patients are expected to follow all Federal and [employer], Rules, [employer]. Ignorance of the Laws does not constitute a valid excuse. The use of any illegal substances is prohibited. 10. Adopted CDC guidelines & recommendations: Target dosing levels will be at or below 60 MME/day. Use of benzodiazepines** is not recommended.  Exceptions: There are only two exceptions to the rule of not receiving pain medications from other Healthcare Providers. 1. Exception #1 (Emergencies): In the event of an emergency (i.e.: accident requiring emergency care), you are allowed to receive additional pain medication. However, you are responsible for: As soon as you are able, call our office [phone], at any time of the day or night, and leave a message stating your name, the date and nature of the emergency, and the name and dose of the medication prescribed. In the event that your call is answered by a member of our staff, make sure to document and save the date, time, and the name of the person that took your information.  2. Exception #2 (Planned Surgery): In the event that you are scheduled by another doctor or dentist to have any type of surgery or procedure, you are allowed (for a period no longer than 30 days), to receive additional pain medication, for the acute post-op pain. However, in this case, you are responsible for picking up a copy of our "Post-op Pain Management for Surgeons" handout, and giving it to your surgeon or dentist. This document is available at our office, and does not  require an appointment to obtain it. Simply go to our office during business hours (Monday-Thursday from 8:00 AM to  4:00 PM) (Friday 8:00 AM to 12:00 Noon) or if you have a scheduled appointment with [place], prior to your surgery, and ask for it by name. In addition, you will need to provide [place] with your name, name of your surgeon, type of surgery, and date of procedure or surgery.  *Opioid medications include: morphine, codeine, oxycodone, oxymorphone, hydrocodone, hydromorphone, meperidine, tramadol, tapentadol, buprenorphine, fentanyl, methadone. **Benzodiazepine medications include: diazepam (Valium), alprazolam (Xanax), clonazepam (Klonopine), lorazepam (Ativan), clorazepate (Tranxene), chlordiazepoxide (Librium), estazolam (Prosom), oxazepam (Serax), temazepam (Restoril), triazolam (Halcion) (Last updated: 08/07/2017) ____________________________________________________________________________________________

## 2018-04-07 ENCOUNTER — Ambulatory Visit: Payer: Self-pay | Admitting: Pain Medicine

## 2018-04-22 ENCOUNTER — Encounter: Payer: Self-pay | Admitting: Nurse Practitioner

## 2018-06-10 IMAGING — CR DG SI JOINTS 3+V
3 series · 3 of 3 positions shown · non-contrast
Comparison: None

CLINICAL DATA: Chronic pain radiating to LEFT hip, back surgery in
September 2015

EXAM:
BILATERAL SACROILIAC JOINTS - 3+ VIEW

[si joints ap]
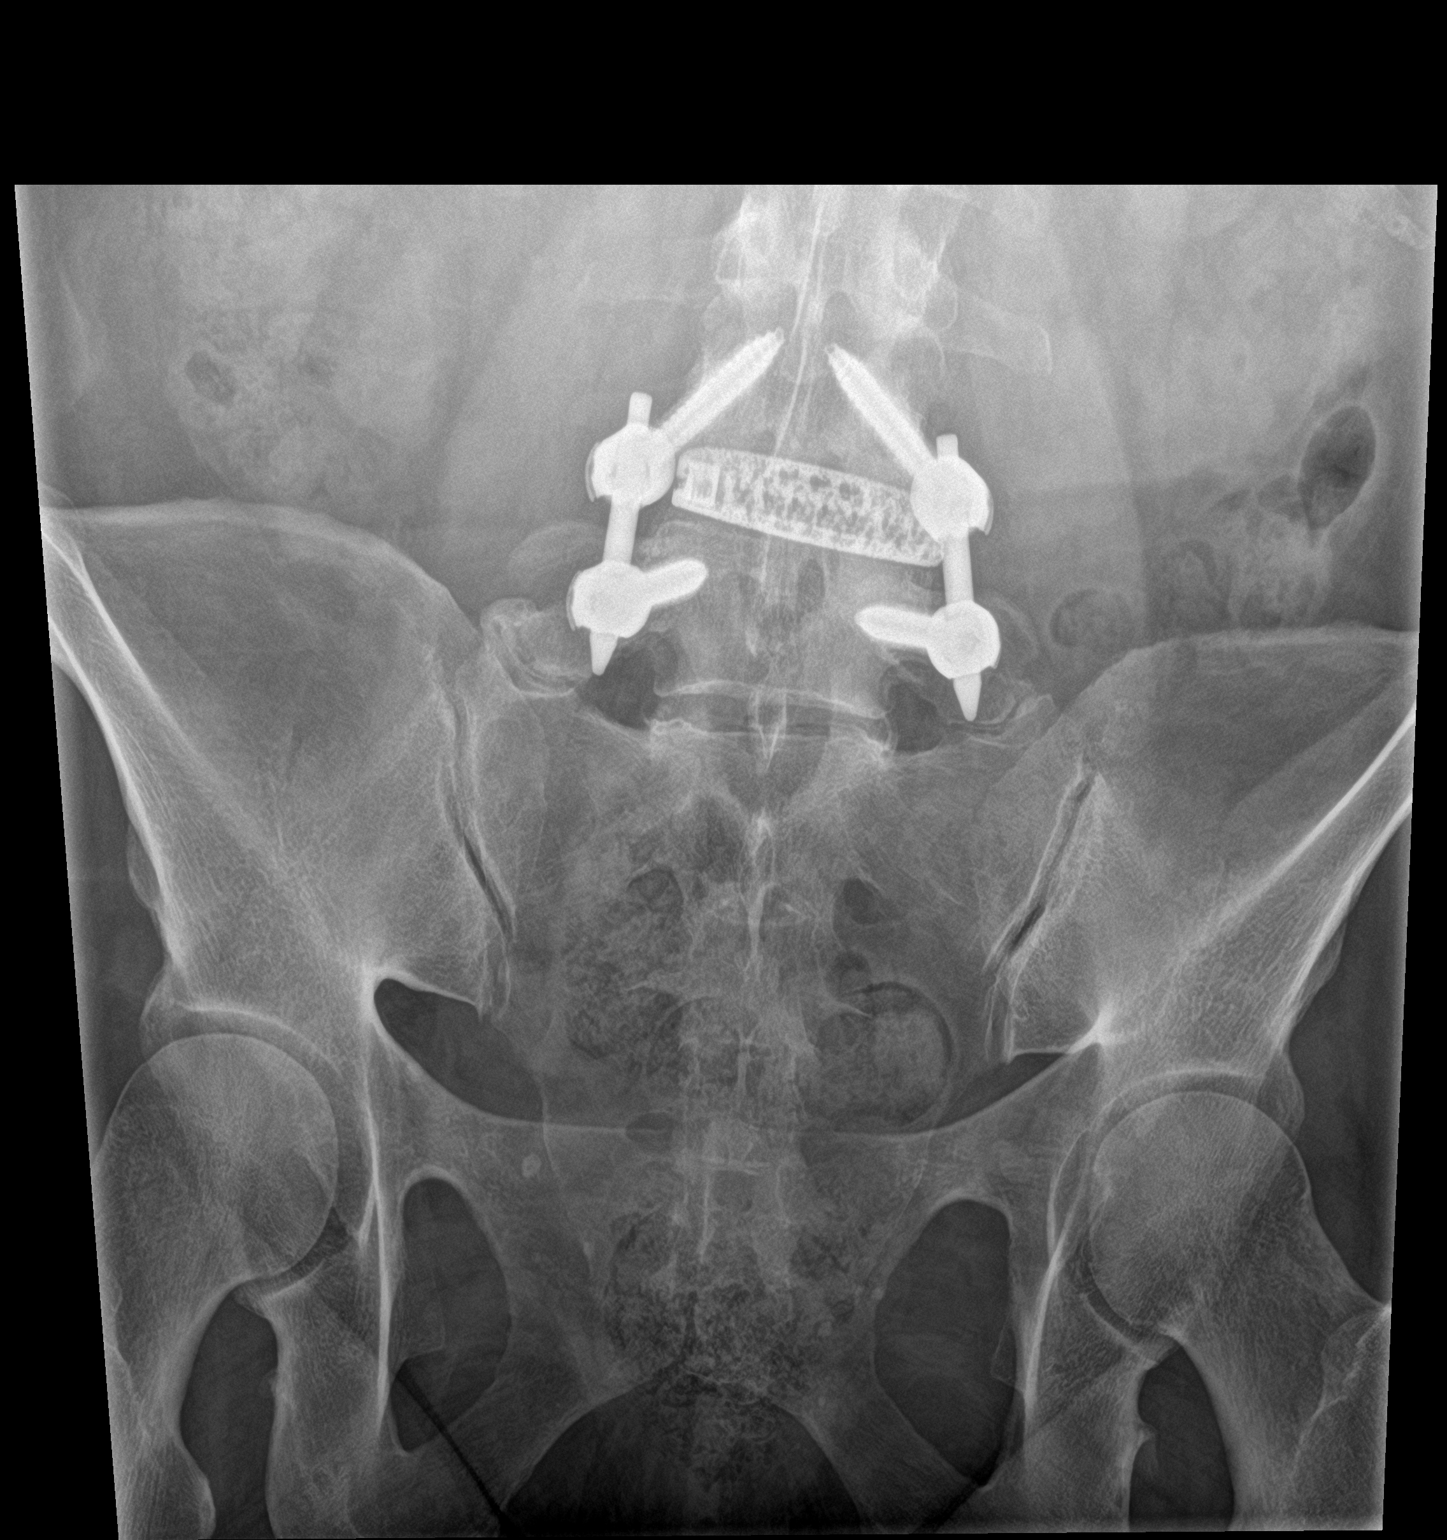

[si joints obl (1 of 2)]
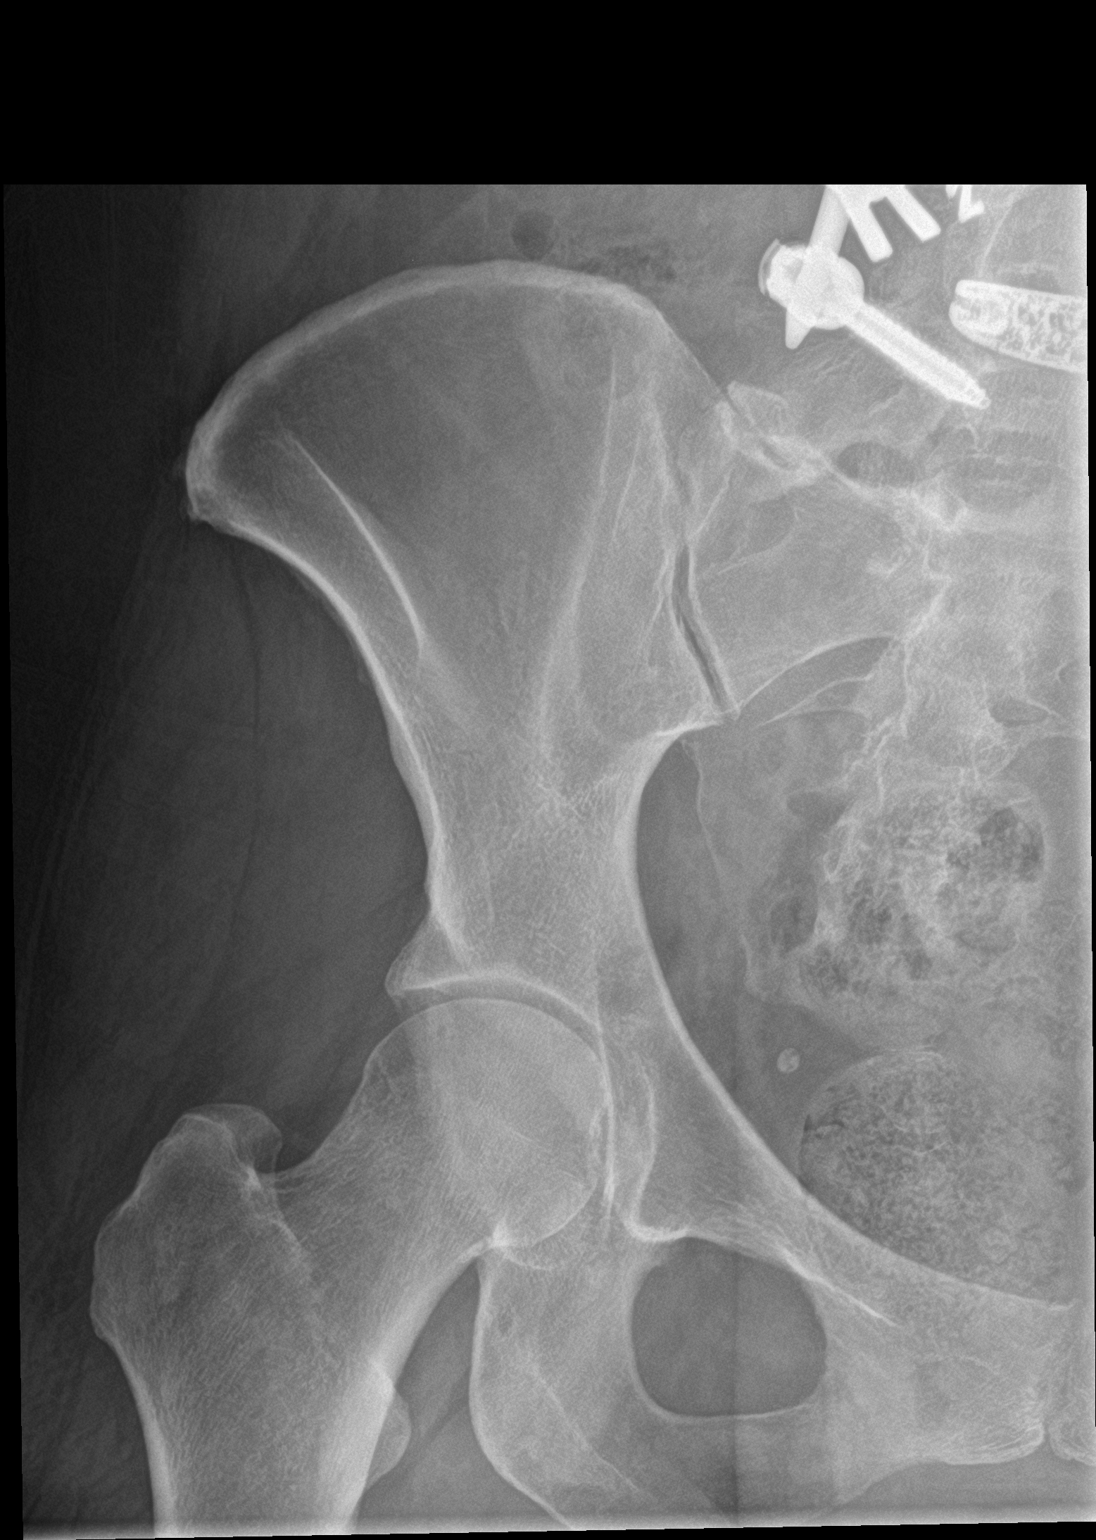

[si joints obl (2 of 2)]
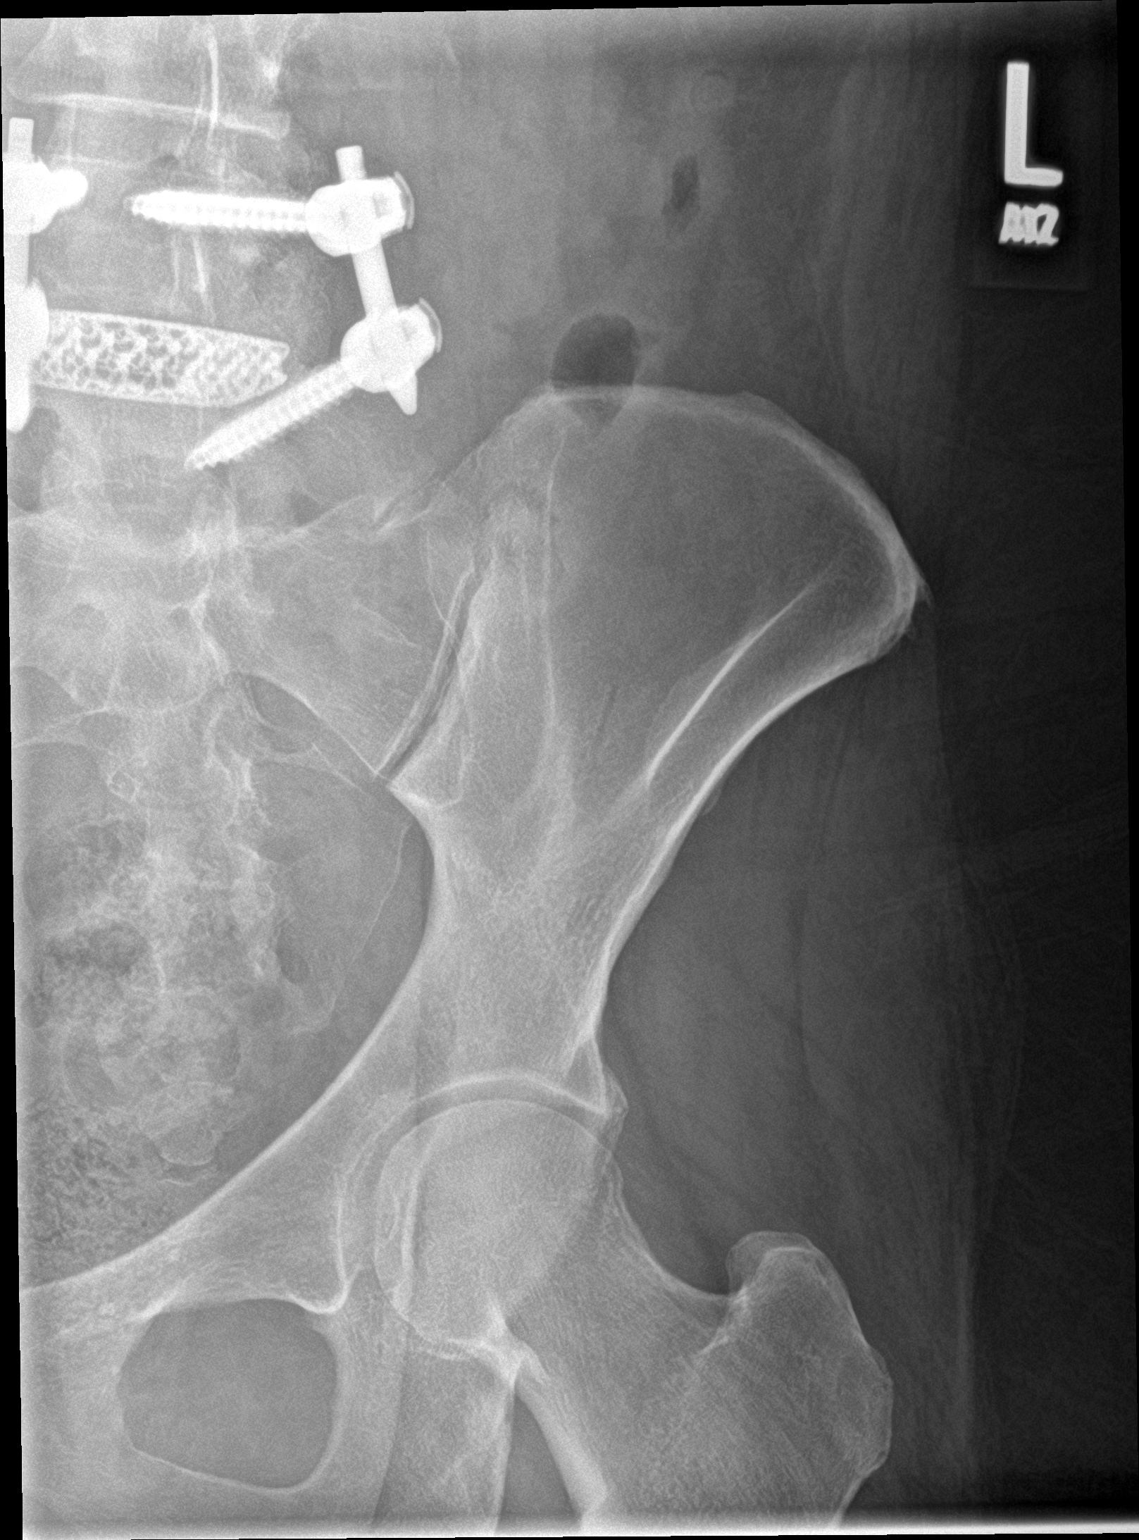

[3 of 3 positions shown; findings below may reference images not displayed]

FINDINGS: SI joints symmetric and preserved.

Prior posterior fusion L4-L5 with BILATERAL pedicle screws/bars and
intervening disc prosthesis.

Sacrum unremarkable.

Visualized pelvis intact.
IMPRESSION: No acute abnormalities.

Prior L4-L5 fusion.

## 2018-06-10 IMAGING — CR DG HIP (WITH OR WITHOUT PELVIS) 2-3V*L*
3 series · 3 of 3 positions shown · non-contrast
Comparison: None

CLINICAL DATA: Chronic pain radiating to LEFT hip for couple weeks,
no injury, back surgery in September 2015

EXAM:
DG HIP (WITH OR WITHOUT PELVIS) 2-3V LEFT

[pelvis ap]
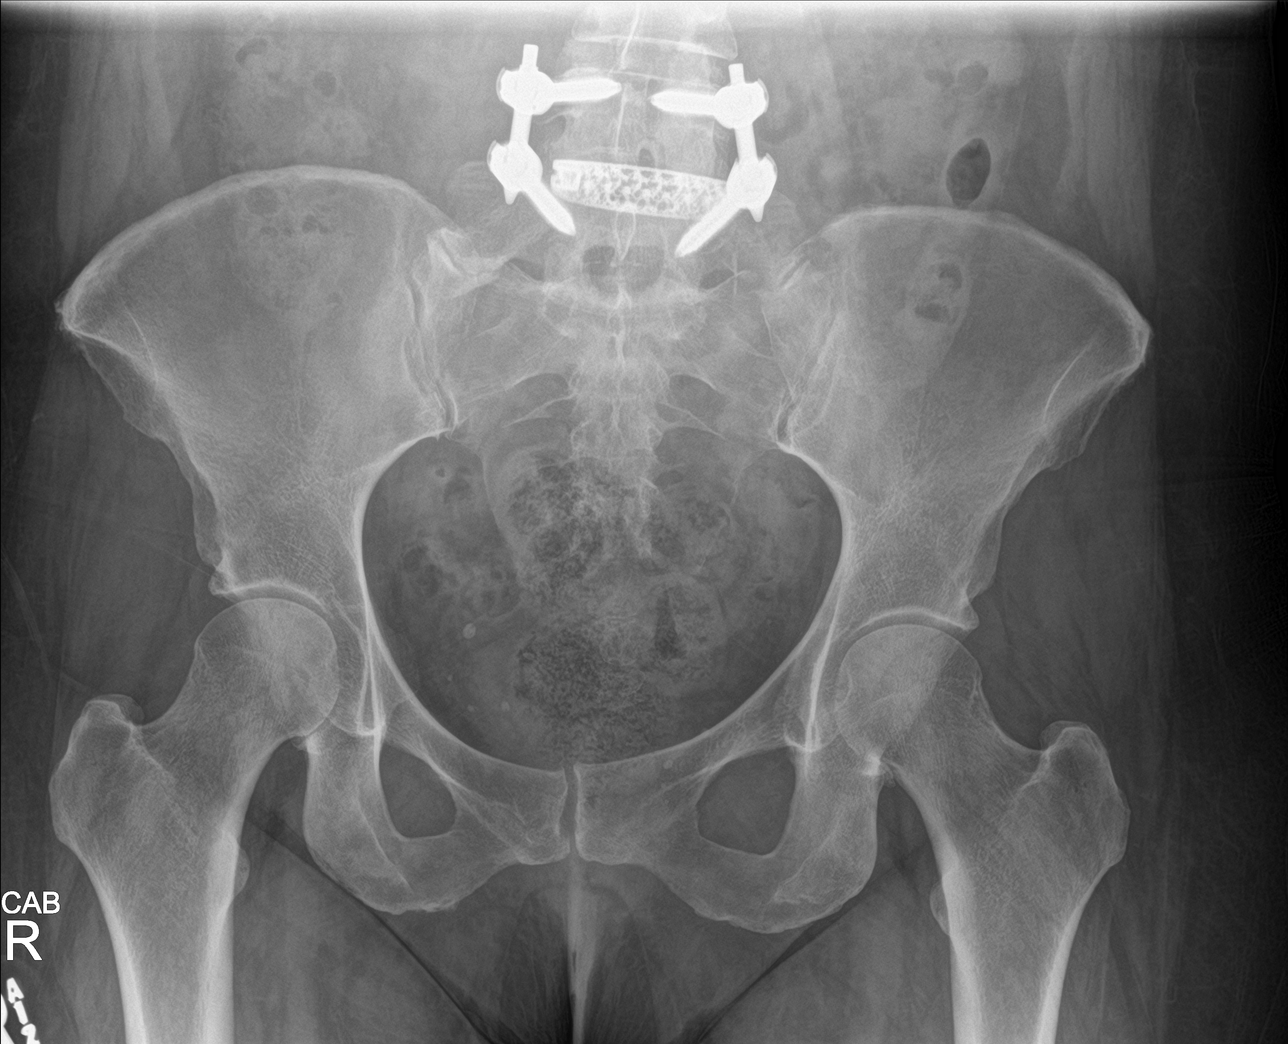

[hip ap]
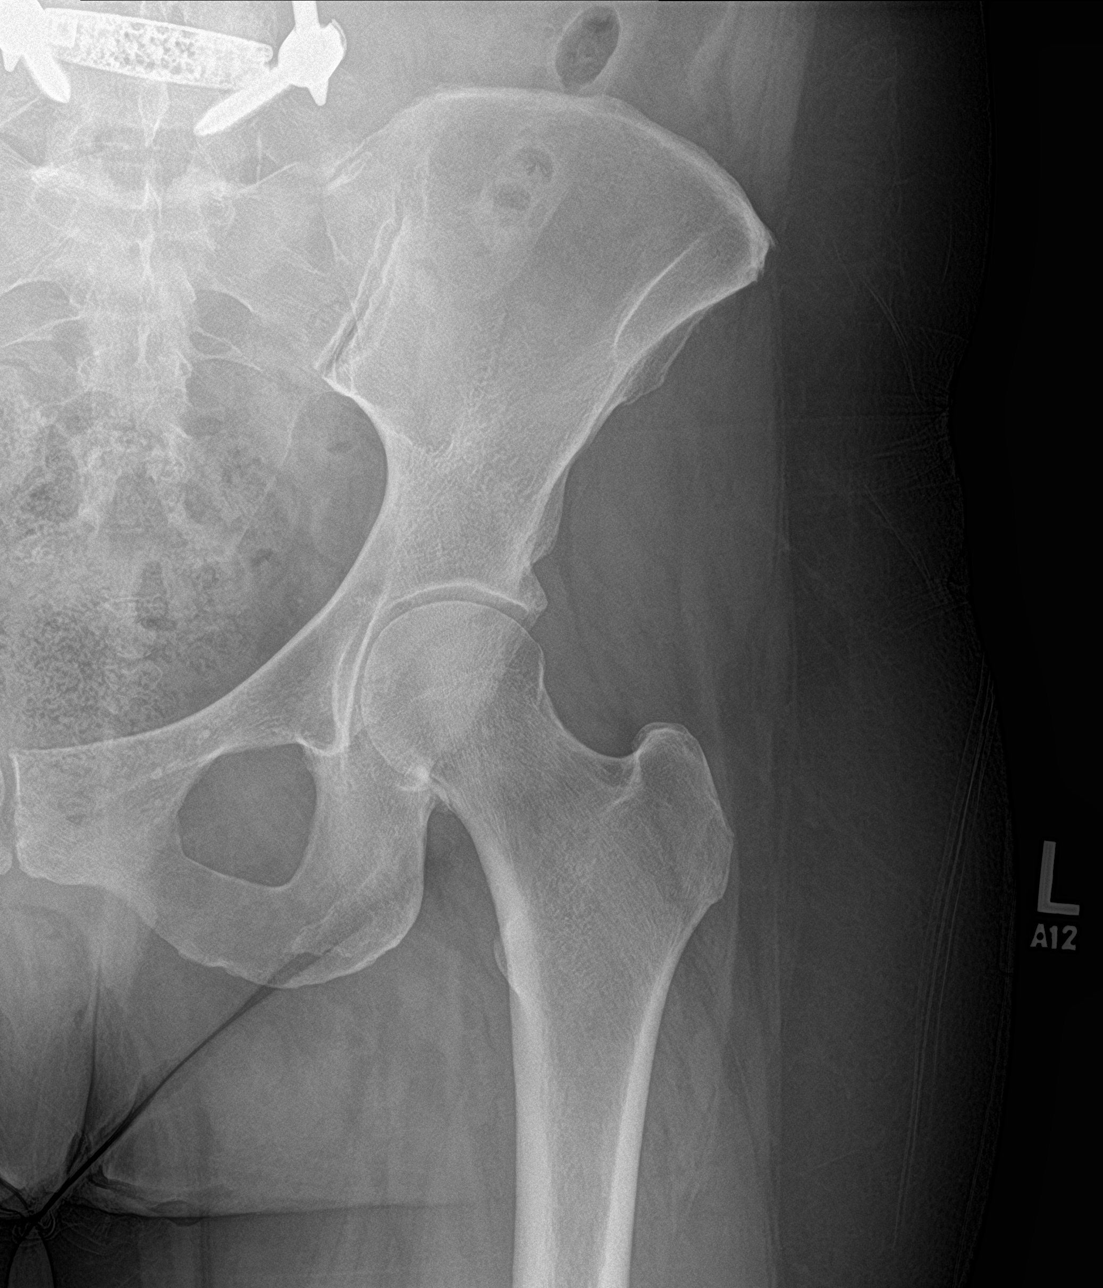

[hip lat]
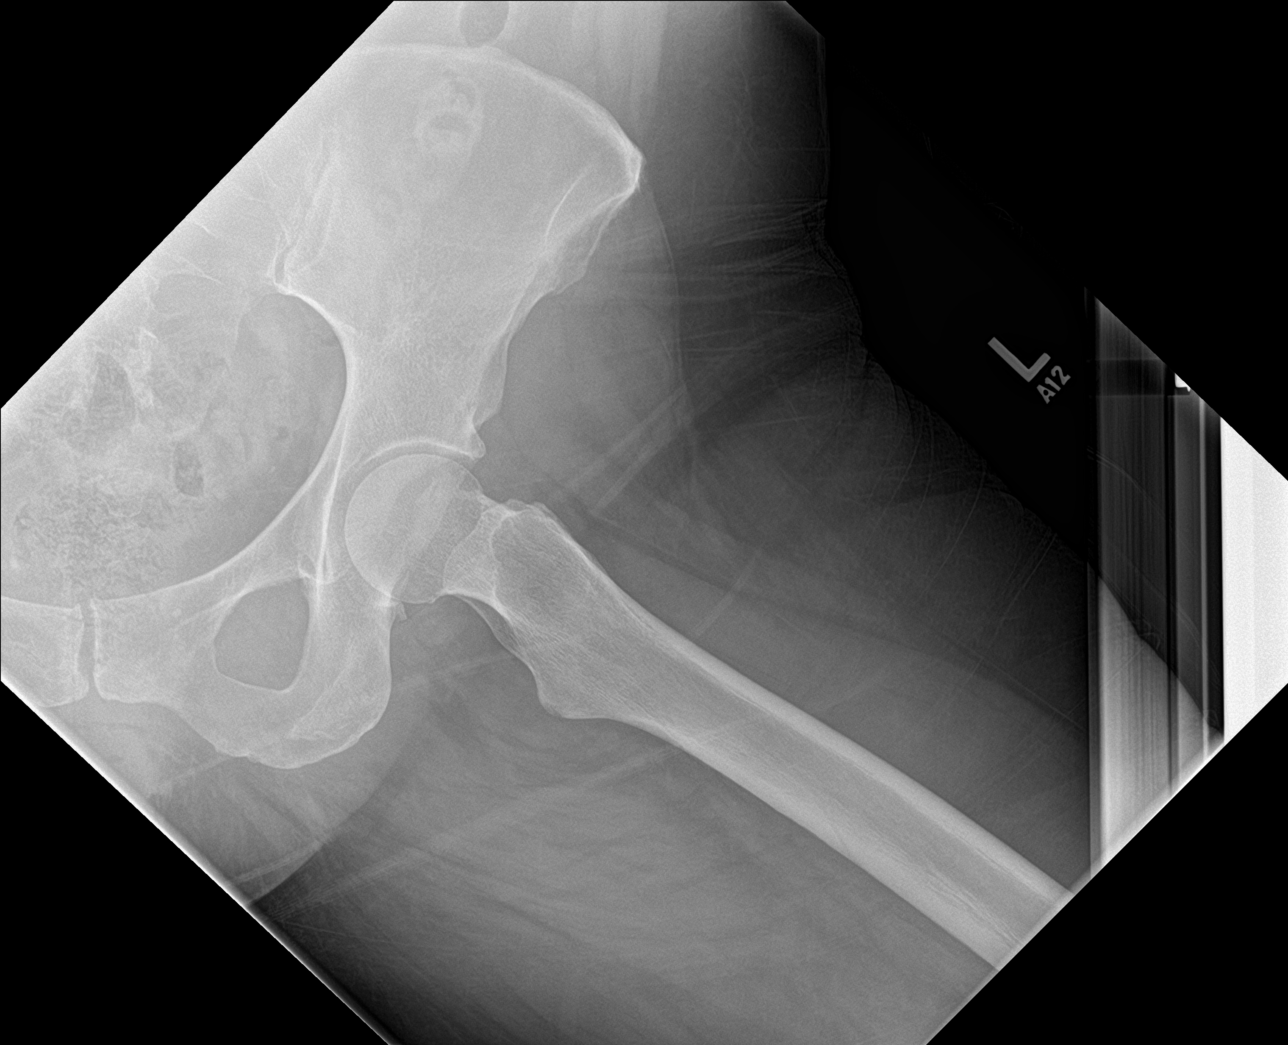

[3 of 3 positions shown; findings below may reference images not displayed]

FINDINGS: Osseous mineralization grossly normal for technique.

Hip and SI joint spaces preserved.

No acute fracture, dislocation, or bone destruction.

Prior L4-L5 posterior fusion with intervening disc prosthesis.
IMPRESSION: No acute osseous abnormalities.

Prior L4-L5 posterior fusion.

## 2018-07-16 ENCOUNTER — Ambulatory Visit: Payer: BLUE CROSS/BLUE SHIELD | Admitting: Family Medicine

## 2018-07-20 ENCOUNTER — Ambulatory Visit: Payer: BLUE CROSS/BLUE SHIELD | Admitting: Family Medicine

## 2019-02-02 ENCOUNTER — Ambulatory Visit
Admission: RE | Admit: 2019-02-02 | Discharge: 2019-02-02 | Disposition: A | Discharge status: Home / Self Care | Payer: Self-pay | Source: Ambulatory Visit | Attending: Oncology | Admitting: Oncology

## 2019-02-02 ENCOUNTER — Ambulatory Visit: Payer: Self-pay | Attending: Oncology

## 2019-02-02 ENCOUNTER — Other Ambulatory Visit: Payer: Self-pay

## 2019-02-02 VITALS — BP 131/95 | HR 71 | Temp 97.9°F | Ht 66.5 in | Wt 177.0 lb

## 2019-02-02 DIAGNOSIS — Z0000 Encounter for general adult medical examination without abnormal findings: Secondary | ICD-10-CM | POA: Insufficient documentation

## 2019-02-02 NOTE — Progress Notes (Signed)
See note

## 2019-02-02 NOTE — Progress Notes (Signed)
  Subjective:     Patient ID: [name], female   DOB: Sep 24, 1970, 48 y.o.   MRN: [id number]  HPI   Review of Systems     Objective:   Physical Exam Chest:     Breasts:        Right: No swelling, bleeding, inverted nipple, mass, nipple discharge, skin change or tenderness.        Left: No swelling, bleeding, inverted nipple, mass, nipple discharge, skin change or tenderness.        Assessment:     48 year old patient presents for [facility] clinic visit.  Patient screened, and meets BCCCP eligibility.  Patient does not have insurance, Medicare or Medicaid. Instructed patient on breast self awareness using teach back method.  Clinical breast exam unremarkable. Patient does not meet [place] guidelines for breast cancer, but does meet NCCN guidelines for genetic testing.  Will contact primary physician to discuss.    Plan:     Sent for bilateral screening mammogram.

## 2019-02-15 NOTE — Progress Notes (Signed)
Letter mailed from [facility] to notify of normal mammogram results.  Patient to return in one year for annual screening.  Copy to [facility]. 

## 2019-06-08 ENCOUNTER — Encounter: Payer: Self-pay | Admitting: Family Medicine

## 2019-06-08 ENCOUNTER — Ambulatory Visit (INDEPENDENT_AMBULATORY_CARE_PROVIDER_SITE_OTHER): Payer: BLUE CROSS/BLUE SHIELD | Admitting: Family Medicine

## 2019-06-08 ENCOUNTER — Other Ambulatory Visit: Payer: Self-pay

## 2019-06-08 DIAGNOSIS — R109 Unspecified abdominal pain: Secondary | ICD-10-CM

## 2019-06-08 DIAGNOSIS — R10A1 Flank pain, right side: Secondary | ICD-10-CM

## 2019-06-08 DIAGNOSIS — R079 Chest pain, unspecified: Secondary | ICD-10-CM

## 2019-06-08 NOTE — Progress Notes (Signed)
Name: [name]   MRN: [id number]    DOB: 10/16/1970   Date:06/08/2019       Progress Note  Subjective:    Chief Complaint  Chief Complaint  Patient presents with  . Back Pain    mid lower back pain, lower right lung. Feels like a pulled muscle but it has been going on for 6-8 months. She feels it when she takes a deep breath. Pain is gradually worsening. She has concerns because she is a current every day smoker.     I connected with  [name]  on 06/08/19 at  9:40 AM EST by a video enabled telemedicine application and verified that I am speaking with the correct person using two identifiers.  I discussed the limitations of evaluation and management by telemedicine and the availability of in person appointments. The patient expressed understanding and agreed to proceed. Staff also discussed with the patient that there may be a patient responsible charge related to this service. Patient Location: home Provider Location: [facility] Additional Individuals present: none  Back Pain Associated symptoms include chest pain. Pertinent negatives include no abdominal pain, fever, leg pain, numbness or weakness.  Chest Pain  This is a chronic problem. The current episode started more than 1 month ago (started gradually 6 months ago). The onset quality is gradual. The problem occurs constantly. The problem has been gradually worsening. Pain location: right posterior back/chest/rib area, feels deep to ribs. The pain is at a severity of 2/10. The pain is moderate. Quality: irritating, achy. Radiates to: sometimes radiates from right posterior rib/flank area to left area. Associated symptoms include back pain and a cough. Pertinent negatives include no abdominal pain, claudication, diaphoresis, dizziness, exertional chest pressure, fever, hemoptysis, irregular heartbeat, leg pain, lower extremity edema, malaise/fatigue, nausea, near-syncope, numbness, orthopnea, palpitations, PND, shortness of breath, sputum  production, syncope, vomiting or weakness. Associated symptoms comments: Night sweats for a year "smokers cough in am with productive sputum" . The pain is aggravated by deep breathing (laying on that area hurt). She has tried nothing for the symptoms. The treatment provided no relief.      Patient Active Problem List   Diagnosis Date Noted  . Knee effusion, right 03/18/2018  . Cyst of right kidney 01/08/2018  . Tobacco use 08/25/2017  . Preventative health care 08/25/2017  . Trigger point with back pain (Left) 07/21/2017  . Acute postoperative pain 05/06/2017  . Depression 01/30/2017  . Neurogenic pain 01/14/2017  . Lumbar facet syndrome (Bilateral) 12/18/2016  . Lumbar spondylosis 12/18/2016  . Chronic musculoskeletal pain 12/17/2016  . Chronic pain syndrome 12/05/2016  . Long term (current) use of opiate analgesic 12/05/2016  . Long term prescription opiate use 12/05/2016  . Opiate use (85.5 MME/Day) 12/05/2016  . Chronic hip pain (Secondary source of pain) (Left) 12/05/2016  . Failed back surgical syndrome 12/05/2016  . Discogenic low back pain 12/05/2016  . Lumbar discogenic pain syndrome 12/05/2016  . Chronic sacroiliac joint pain (Left) 12/05/2016  . Elevated rheumatoid factor 10/22/2016  . Chronic, continuous use of opioids 10/03/2016  . Arthralgia of multiple joints 10/02/2016  . Chronic low back pain (Primary Source of Pain) (midline) 07/22/2016  . Breast cancer screening 04/03/2015  . Bladder irritation 04/03/2015  . Lumbosacral spondylosis without myelopathy 03/12/2015  . Lumbar central spinal stenosis (L4-5) 03/12/2015    Class: History of  . Adjustment disorder with depressed mood 03/10/2015  . Stressful life events affecting family and household 03/10/2015  .  Vitamin D deficiency 03/10/2015    Social History   Tobacco Use  . Smoking status: Current Every Day Smoker    Packs/day: 1.00    Years: 20.00    Pack years: 20.00    Types: Cigarettes  .  Smokeless tobacco: Never Used  Substance Use Topics  . Alcohol use: No     Current Outpatient Medications:  .  Oxycodone HCl 10 MG TABS, Take 10 mg by mouth every 6 (six) hours as needed., Disp: , Rfl:   Allergies  Allergen Reactions  . Codeine Nausea And Vomiting    I personally reviewed active problem list, medication list, allergies, family history, social history, health maintenance, notes from last encounter, lab results, imaging with the patient/caregiver today.   Review of Systems  Constitutional: Negative.  Negative for activity change, appetite change, chills, diaphoresis, fatigue, fever, malaise/fatigue and unexpected weight change.  HENT: Negative.   Eyes: Negative.   Respiratory: Positive for cough. Negative for hemoptysis, sputum production and shortness of breath.   Cardiovascular: Positive for chest pain. Negative for palpitations, orthopnea, claudication, leg swelling, syncope, PND and near-syncope.  Gastrointestinal: Negative.  Negative for abdominal pain, nausea and vomiting.  Endocrine: Negative.   Genitourinary: Negative.   Musculoskeletal: Positive for back pain.  Skin: Negative.   Allergic/Immunologic: Negative.   Neurological: Negative.  Negative for dizziness, weakness and numbness.  Hematological: Negative.   Psychiatric/Behavioral: Negative.   All other systems reviewed and are negative.     Objective:   Virtual encounter, vitals limited, only able to obtain the following Today's Vitals   06/08/19 0944  PainSc: 2    There is no height or weight on file to calculate BMI. Nursing Note and Vital Signs reviewed.  Physical Exam Vitals and nursing note reviewed.  Constitutional:      General: She is not in acute distress.    Appearance: Normal appearance. She is well-developed. She is not ill-appearing, toxic-appearing or diaphoretic.  HENT:     Head: Normocephalic and atraumatic.  Eyes:     General:        Right eye: No discharge.         Left eye: No discharge.     Conjunctiva/sclera: Conjunctivae normal.  Neck:     Trachea: No tracheal deviation.  Cardiovascular:     Rate and Rhythm: Normal rate.  Pulmonary:     Effort: Pulmonary effort is normal. No respiratory distress.     Breath sounds: No stridor.     Comments: Patient able to speak in full and complete sentences, no observed retractions, accessory muscle use, tachypnea, no audible wheeze or stridor no coughing throughout visit Skin:    Coloration: Skin is not jaundiced or pale.     Findings: No rash.  Neurological:     Mental Status: She is alert.  Psychiatric:        Mood and Affect: Mood normal.        Behavior: Behavior normal.     PE limited by telephone encounter  No results found for this or any previous visit (from the past 72 hour(s)).  Assessment and Plan:     ICD-10-CM   1. Right-sided chest pain  R07.9 DG Chest 2 View   CP for 6 months - pt concerned since she is smoker, has mild morning "smokers cough" that is productive, no change, no exertional sx  2. Right flank pain  R10.9 DG Chest 2 View   would want urine testing and in person to  do physical exam, for now xray to eval, no urinary sx, no abl sx, not related to eating/positional changes    Virtual visit regarding chronic right sided posterior right ribs, right flank or right back pain been ongoing for 6 to 8 months, no red flags such as unintentional weight loss, hemoptysis, shortness of breath but patient is a smoker with morning chronic cough will do chest x-Fettes to evaluate.  May want to try steroids and inhalers to see if it would help with any possible bronchitis pleurisy or costochondritis.  Did discuss how virtual encounter severely limits ability to assess this because it may also be something intra-abdominal or back muscle skeletal and there may be other tests or imaging which may be indicated based off of physical exam findings.  Encourage patient to come in in person  For now will  get CXR  -Red flags and when to present for emergency care or RTC including fever >101.62F, chest pain, shortness of breath, new/worsening/un-resolving symptoms, reviewed with patient at time of visit. Follow up and care instructions discussed and provided in AVS. - I discussed the assessment and treatment plan with the patient. The patient was provided an opportunity to ask questions and all were answered. The patient agreed with the plan and demonstrated an understanding of the instructions.  I provided 15 minutes of non-face-to-face time during this encounter.  Delsa Grana, PA-C 06/08/19 10:15 AM

## 2019-06-09 ENCOUNTER — Ambulatory Visit
Admission: RE | Admit: 2019-06-09 | Discharge: 2019-06-09 | Disposition: A | Discharge status: Home / Self Care | Payer: Self-pay | Attending: Family Medicine | Admitting: Family Medicine

## 2019-06-09 ENCOUNTER — Ambulatory Visit
Admission: RE | Admit: 2019-06-09 | Discharge: 2019-06-09 | Disposition: A | Discharge status: Home / Self Care | Payer: Self-pay | Source: Ambulatory Visit | Attending: Family Medicine | Admitting: Family Medicine

## 2019-06-09 ENCOUNTER — Other Ambulatory Visit: Payer: Self-pay

## 2019-06-09 DIAGNOSIS — R10A1 Flank pain, right side: Secondary | ICD-10-CM

## 2019-06-09 DIAGNOSIS — R079 Chest pain, unspecified: Secondary | ICD-10-CM

## 2019-06-09 DIAGNOSIS — R109 Unspecified abdominal pain: Secondary | ICD-10-CM | POA: Insufficient documentation

## 2019-06-15 ENCOUNTER — Ambulatory Visit: Payer: BLUE CROSS/BLUE SHIELD | Admitting: Family Medicine

## 2019-06-30 ENCOUNTER — Encounter: Payer: Self-pay | Admitting: Family Medicine

## 2019-09-02 ENCOUNTER — Ambulatory Visit (INDEPENDENT_AMBULATORY_CARE_PROVIDER_SITE_OTHER): Payer: Self-pay | Admitting: Family Medicine

## 2019-09-02 ENCOUNTER — Other Ambulatory Visit: Payer: Self-pay

## 2019-09-02 ENCOUNTER — Encounter: Payer: Self-pay | Admitting: Family Medicine

## 2019-09-02 VITALS — BP 110/68 | HR 94 | Temp 98.3°F | Resp 14 | Ht 67.0 in | Wt 179.6 lb

## 2019-09-02 DIAGNOSIS — R109 Unspecified abdominal pain: Secondary | ICD-10-CM

## 2019-09-02 DIAGNOSIS — R739 Hyperglycemia, unspecified: Secondary | ICD-10-CM

## 2019-09-02 DIAGNOSIS — Z1211 Encounter for screening for malignant neoplasm of colon: Secondary | ICD-10-CM

## 2019-09-02 DIAGNOSIS — Z720 Tobacco use: Secondary | ICD-10-CM

## 2019-09-02 DIAGNOSIS — Z1231 Encounter for screening mammogram for malignant neoplasm of breast: Secondary | ICD-10-CM

## 2019-09-02 DIAGNOSIS — Z0000 Encounter for general adult medical examination without abnormal findings: Secondary | ICD-10-CM

## 2019-09-02 LAB — POCT URINALYSIS DIPSTICK
Bilirubin, UA: NEGATIVE
Blood, UA: NEGATIVE
Glucose, UA: NEGATIVE
Ketones, UA: NEGATIVE
Leukocytes, UA: NEGATIVE
Nitrite, UA: NEGATIVE
Protein, UA: NEGATIVE
Spec Grav, UA: 1.015 (ref 1.010–1.025)
Urobilinogen, UA: 0.2 E.U./dL
pH, UA: 6.5 (ref 5.0–8.0)

## 2019-09-02 NOTE — Progress Notes (Signed)
Patient: [name], Female    DOB: 10-27-1970, 49 y.o.   MRN: [id number] Delsa Grana, PA-C Visit Date: 09/02/2019  Today's Provider: Delsa Grana, PA-C   Chief Complaint  Patient presents with  . Annual Exam  . Urinary Tract Infection    flank pain   Subjective:   Annual physical exam:  [name] is a 49 y.o. female who presents today for complete physical exam:  Exercise/Activity:   Tries to exercise and walk, she tried to do what she can with her back, 3-4 x a week. Diet/nutrition:   No efforts right now Sleep: sleeps okay when pain in managed, only gets up once a night  [facility] [place] manages her chronic back pain and narcotic medicines. She is additionally having back/flank pain breaking through her pain meds and she uses [facility] to treat but this makes her feel like her kidneys are injured and hurting?  She previously thought it was her lungs and we did a visit regarding that in the past she did a chest x-Wake and then she felt better she states that she looked up some anatomy to see where her pain is located and not as what convinced her that it was possibly her kidneys causing pain She is on oxycodone 20 mg tablets 180 every month, she takes every 4-6 hours.  I did review the controlled substance database.  She had diagnosis of multiple areas of chronic pain, failed back surgery  UA done here today  Results for orders placed or performed in visit on 09/02/19  POCT urinalysis dipstick  Result Value Ref Range   Color, UA clear    Clarity, UA clear    Glucose, UA Negative Negative   Bilirubin, UA neg    Ketones, UA neg    Spec Grav, UA 1.015 1.010 - 1.025   Blood, UA neg    pH, UA 6.5 5.0 - 8.0   Protein, UA Negative Negative   Urobilinogen, UA 0.2 0.2 or 1.0 E.U./dL   Nitrite, UA negative    Leukocytes, UA Negative Negative   Appearance clear    Odor strong   Other than strong odor no signs of infection patient denies any urinary  frequency urgency hematuria  USPSTF grade A and B recommendations - reviewed and addressed today  Depression:  Phq 9 completed today by patient, was reviewed by me with patient in the room PHQ score is neg, pt feels good PHQ 2/9 Scores 09/02/2019 06/08/2019 03/24/2018 03/17/2018  PHQ - 2 Score 0 0 0 3  PHQ- 9 Score 0 0 - 10  Exception Documentation - - - -   Depression screen [facility] 2/9 09/02/2019 06/08/2019 03/24/2018 03/17/2018 03/17/2018  Decreased Interest 0 0 0 0 0  Down, Depressed, Hopeless 0 0 0 3 0  PHQ - 2 Score 0 0 0 3 0  Altered sleeping 0 0 - 1 -  Tired, decreased energy 0 0 - 3 -  Change in appetite 0 0 - 0 -  Feeling bad or failure about yourself  0 0 - 3 -  Trouble concentrating 0 0 - 0 -  Moving slowly or fidgety/restless 0 0 - 0 -  Suicidal thoughts 0 0 - 0 -  PHQ-9 Score 0 0 - 10 -  Difficult doing work/chores Not difficult at all - - Somewhat difficult -  Some recent data might be hidden    Alcohol screening:   Office Visit from 09/02/2019 in  [facility]  AUDIT-C Score  0      Immunizations and Health Maintenance: Health Maintenance  Topic Date Due  . COLONOSCOPY  Never done  . INFLUENZA VACCINE  09/08/2019 (Originally 01/09/2019)  . HIV Screening  09/01/2020 (Originally 02/05/1984)  . MAMMOGRAM  02/02/2020  . PAP SMEAR-Modifier  08/25/2020  . TETANUS/TDAP  03/11/2024     Hep C Screening: n/a  STD testing and prevention (HIV/chl/gon/syphilis):  see above, no additional testing desired by pt today  Intimate partner violence:  Feels safe  Sexual History/Pain during Intercourse: Married  Menstrual History/LMP/Abnormal Bleeding: none  No LMP recorded. Patient has had a hysterectomy.  Incontinence Symptoms: none  Breast cancer:   Last Mammogram: *see HM list above  Due in august 2021 BRCA gene screening:    Cervical cancer screening:   Hysterectomy  Pt has positive family hx of cancers - breast, ovarian, uterine, colon:        Osteoporosis:   Discussion on osteoporosis per age, including high calcium and vitamin D supplementation, weight bearing exercises Pt is supplementing with daily calcium/Vit D - she has one ovary - multivitamin she thinks    Skin cancer:  Hx of skin CA -  NO Discussed atypical lesions   Colorectal cancer:   Colonoscopy is not done  Discussed concerning signs and sx of CRC, pt denies melena, hematochezia  Lung cancer:   Low Dose CT Chest recommended if Age 58-80 years, 30 pack-year currently smoking OR have quit w/in 15years. Patient does not qualify.    Social History   Tobacco Use  . Smoking status: Current Every Day Smoker    Packs/day: 1.00    Years: 25.00    Pack years: 25.00    Types: Cigarettes  . Smokeless tobacco: Never Used  . Tobacco comment: recently cut back to 3-4 cig a day, smoking since 20-30's  Substance Use Topics  . Alcohol use: No  . Drug use: No       [facility] 09/02/2019 in [facility]  AUDIT-C Score  0      Family History  Problem Relation Age of Onset  . Hypertension Mother   . Diabetes Mother   . Hypertension Father   . Hyperlipidemia Father   . Arthritis Father   . Lung disease Father   . Heart disease Father   . Lymphoma Father   . Cancer Maternal Aunt        breast  . Breast cancer Maternal Aunt 34  . Depression Sister   . Heart disease Maternal Grandfather   . Depression Sister   . Stroke Neg Hx      Blood pressure/Hypertension: BP Readings from Last 3 Encounters:  09/02/19 110/68  02/02/19 (!) 131/95  03/24/18 136/82    Weight/Obesity: Wt Readings from Last 3 Encounters:  09/02/19 179 lb 9.6 oz (81.5 kg)  02/02/19 177 lb (80.3 kg)  03/24/18 180 lb (81.6 kg)   BMI Readings from Last 3 Encounters:  09/02/19 28.13 kg/m  02/02/19 28.14 kg/m  03/24/18 29.05 kg/m     Lipids:  Lab Results  Component Value Date   CHOL 193 01/08/2018   CHOL 207 (H) 09/18/2016   CHOL 213 (H) 03/10/2015    Lab Results  Component Value Date   HDL 48 (L) 01/08/2018   HDL 78 09/18/2016   HDL 55 03/10/2015   Lab Results  Component Value Date   LDLCALC 115 (H) 01/08/2018   LDLCALC 117 (H)  09/18/2016   LDLCALC 128 (H) 03/10/2015   Lab Results  Component Value Date   TRIG 187 (H) 01/08/2018   TRIG 62 09/18/2016   TRIG 151 (H) 03/10/2015   Lab Results  Component Value Date   CHOLHDL 4.0 01/08/2018   CHOLHDL 2.7 09/18/2016   No results found for: LDLDIRECT Based on the results of lipid panel his/her cardiovascular risk factor ( using [facility] )  in the next 10 years is: The 10-year ASCVD risk score Mikey Bussing DC Brooke Bonito., et al., 2013) is: 3%   Values used to calculate the score:     Age: 41 years     Sex: Female     Is Non-Hispanic African American: No     Diabetic: No     Tobacco smoker: Yes     Systolic Blood Pressure: 761 mmHg     Is BP treated: No     HDL Cholesterol: 48 mg/dL     Total Cholesterol: 193 mg/dL Glucose:  Glucose  Date Value Ref Range Status  12/05/2016 85 65 - 99 mg/dL Final  09/18/2016 93 65 - 99 mg/dL Final  03/10/2015 89 65 - 99 mg/dL Final  04/16/2013 139 (H) 65 - 99 mg/dL Final  12/26/2011 166 (H) 65 - 99 mg/dL Final   Glucose, Bld  Date Value Ref Range Status  01/08/2018 101 (H) 65 - 99 mg/dL Final    Comment:    .            Fasting reference interval . For someone without known diabetes, a glucose value between 100 and 125 mg/dL is consistent with prediabetes and should be confirmed with a follow-up test. .    Hypertension: BP Readings from Last 3 Encounters:  09/02/19 110/68  02/02/19 (!) 131/95  03/24/18 136/82   Obesity: Wt Readings from Last 3 Encounters:  09/02/19 179 lb 9.6 oz (81.5 kg)  02/02/19 177 lb (80.3 kg)  03/24/18 180 lb (81.6 kg)   BMI Readings from Last 3 Encounters:  09/02/19 28.13 kg/m  02/02/19 28.14 kg/m  03/24/18 29.05 kg/m      Advanced Care Planning:  A voluntary discussion about advance care  planning including the explanation and discussion of advance directives.   Discussed health care proxy and Living will, and the patient was able to identify a health care proxy as husband - Devota Viruet Patient does not have a living will at present time.   Social History      She        Social History   Socioeconomic History  . Marital status: Married    Spouse name: Not on file  . Number of children: Not on file  . Years of education: Not on file  . Highest education level: Not on file  Occupational History  . Not on file  Tobacco Use  . Smoking status: Current Every Day Smoker    Packs/day: 1.00    Years: 25.00    Pack years: 25.00    Types: Cigarettes  . Smokeless tobacco: Never Used  . Tobacco comment: recently cut back to 3-4 cig a day, smoking since 20-30's  Substance and Sexual Activity  . Alcohol use: No  . Drug use: No  . Sexual activity: Not Currently    Comment: married, it's been a few years since active  Other Topics Concern  . Not on file  Social History Narrative  . Not on file   Social Determinants of Health   Financial Resource Strain:   .  Difficulty of Paying Living Expenses:   Food Insecurity:   . Worried About Charity fundraiser in the Last Year:   . Arboriculturist in the Last Year:   Transportation Needs:   . Film/video editor (Medical):   Marland Kitchen Lack of Transportation (Non-Medical):   Physical Activity:   . Days of Exercise per Week:   . Minutes of Exercise per Session:   Stress:   . Feeling of Stress :   Social Connections:   . Frequency of Communication with Friends and Family:   . Frequency of Social Gatherings with Friends and Family:   . Attends Religious Services:   . Active Member of Clubs or Organizations:   . Attends Archivist Meetings:   Marland Kitchen Marital Status:     Family History        Family History  Problem Relation Age of Onset  . Hypertension Mother   . Diabetes Mother   . Hypertension Father   . Hyperlipidemia  Father   . Arthritis Father   . Lung disease Father   . Heart disease Father   . Lymphoma Father   . Cancer Maternal Aunt        breast  . Breast cancer Maternal Aunt 34  . Depression Sister   . Heart disease Maternal Grandfather   . Depression Sister   . Stroke Neg Hx     Patient Active Problem List   Diagnosis Date Noted  . Knee effusion, right 03/18/2018  . Cyst of right kidney 01/08/2018  . Tobacco use 08/25/2017  . Preventative health care 08/25/2017  . Trigger point with back pain (Left) 07/21/2017  . Acute postoperative pain 05/06/2017  . Depression 01/30/2017  . Neurogenic pain 01/14/2017  . Lumbar facet syndrome (Bilateral) 12/18/2016  . Lumbar spondylosis 12/18/2016  . Chronic musculoskeletal pain 12/17/2016  . Chronic pain syndrome 12/05/2016  . Long term (current) use of opiate analgesic 12/05/2016  . Long term prescription opiate use 12/05/2016  . Opiate use (85.5 MME/Day) 12/05/2016  . Chronic hip pain (Secondary source of pain) (Left) 12/05/2016  . Failed back surgical syndrome 12/05/2016  . Discogenic low back pain 12/05/2016  . Lumbar discogenic pain syndrome 12/05/2016  . Chronic sacroiliac joint pain (Left) 12/05/2016  . Elevated rheumatoid factor 10/22/2016  . Chronic, continuous use of opioids 10/03/2016  . Arthralgia of multiple joints 10/02/2016  . Chronic low back pain (Primary Source of Pain) (midline) 07/22/2016  . Breast cancer screening 04/03/2015  . Bladder irritation 04/03/2015  . Lumbosacral spondylosis without myelopathy 03/12/2015  . Lumbar central spinal stenosis (L4-5) 03/12/2015    Class: History of  . Adjustment disorder with depressed mood 03/10/2015  . Stressful life events affecting family and household 03/10/2015  . Vitamin D deficiency 03/10/2015    Past Surgical History:  Procedure Laterality Date  . ABDOMINAL HYSTERECTOMY    . BACK SURGERY     two back sx one in [place] and one at [place]  . BREAST CYST ASPIRATION      . CESAREAN SECTION    . SPINE SURGERY    . SPINE SURGERY  09/09/2015  . TONSILLECTOMY       Current Outpatient Medications:  .  Oxycodone HCl 10 MG TABS, Take 10 mg by mouth every 6 (six) hours as needed., Disp: , Rfl:   Allergies  Allergen Reactions  . Codeine Nausea And Vomiting    Patient Care Team: Delsa Grana, PA-C as PCP - General (Family  Medicine) Derian, Nicholaus Corolla, MD (Orthopedic Surgery) Renie Ora, MD as Referring Physician (Anesthesiology) Rico Junker, RN as Registered Nurse Theodore Demark, RN as Registered Nurse  Review of Systems  Constitutional: Negative.  Negative for activity change, appetite change, fatigue and unexpected weight change.  HENT: Negative.   Eyes: Negative.   Respiratory: Negative.  Negative for shortness of breath.   Cardiovascular: Negative.  Negative for chest pain, palpitations and leg swelling.  Gastrointestinal: Negative.  Negative for abdominal pain and blood in stool.  Endocrine: Negative.   Genitourinary: Negative.   Musculoskeletal: Negative.  Negative for arthralgias, gait problem, joint swelling and myalgias.  Skin: Negative.  Negative for color change, pallor and rash.  Allergic/Immunologic: Negative.   Neurological: Negative.  Negative for syncope and weakness.  Hematological: Negative.   Psychiatric/Behavioral: Negative.  Negative for confusion, dysphoric mood, self-injury and suicidal ideas. The patient is not nervous/anxious.        10 Systems reviewed and are negative for acute change except as noted in the HPI.        Objective:   Vitals:  Vitals:   09/02/19 1450  BP: 110/68  Pulse: 94  Resp: 14  Temp: 98.3 F (36.8 C)  SpO2: 96%  Weight: 179 lb 9.6 oz (81.5 kg)  Height: _0  (1.702 m)    Body mass index is 28.13 kg/m.  Physical Exam Vitals and nursing note reviewed.  Constitutional:      General: She is not in acute distress.    Appearance: Normal appearance. She is well-developed.  She is not ill-appearing, toxic-appearing or diaphoretic.     Interventions: Face mask in place.  HENT:     Head: Normocephalic and atraumatic.     Right Ear: External ear normal.     Left Ear: External ear normal.  Eyes:     General: Lids are normal. No scleral icterus.       Right eye: No discharge.        Left eye: No discharge.     Conjunctiva/sclera: Conjunctivae normal.  Neck:     Trachea: Phonation normal. No tracheal deviation.  Cardiovascular:     Rate and Rhythm: Normal rate and regular rhythm.     Pulses: Normal pulses.          Radial pulses are 2+ on the right side and 2+ on the left side.       Posterior tibial pulses are 2+ on the right side and 2+ on the left side.     Heart sounds: Normal heart sounds. No murmur. No friction rub. No gallop.   Pulmonary:     Effort: Pulmonary effort is normal. No respiratory distress.     Breath sounds: Normal breath sounds. No stridor. No wheezing, rhonchi or rales.  Chest:     Chest wall: No tenderness.  Abdominal:     General: Bowel sounds are normal. There is no distension.     Palpations: Abdomen is soft.     Tenderness: There is no abdominal tenderness. There is no right CVA tenderness, left CVA tenderness, guarding or rebound.  Musculoskeletal:     Right lower leg: No edema.     Left lower leg: No edema.  Lymphadenopathy:     Cervical: No cervical adenopathy.  Skin:    General: Skin is warm and dry.     Capillary Refill: Capillary refill takes less than 2 seconds.     Coloration: Skin is not jaundiced or pale.  Findings: No rash.  Neurological:     Mental Status: She is alert and oriented to person, place, and time.     Motor: No abnormal muscle tone.     Gait: Gait normal.  Psychiatric:        Mood and Affect: Mood normal.        Speech: Speech normal.        Behavior: Behavior normal.       Fall Risk: Fall Risk  09/02/2019 06/08/2019 03/24/2018 03/17/2018 02/11/2018  Falls in the past year? 0 0 No Yes No    Number falls in past yr: 0 0 1 1 -  Injury with Fall? 0 0 No No -  Comment - - - - -  Risk for fall due to : - - - - -  Risk for fall due to: Comment - - - - -  Follow up - - - - -    Functional Status Survey: Is the patient deaf or have difficulty hearing?: No Does the patient have difficulty seeing, even when wearing glasses/contacts?: No Does the patient have difficulty concentrating, remembering, or making decisions?: No Does the patient have difficulty walking or climbing stairs?: No Does the patient have difficulty dressing or bathing?: No Does the patient have difficulty doing errands alone such as visiting a doctor's office or shopping?: No   Assessment & Plan:    CPE completed today  . USPSTF grade A and B recommendations reviewed with patient; age-appropriate recommendations, preventive care, screening tests, etc discussed and encouraged; healthy living encouraged; see AVS for patient education given to patient  . Discussed importance of 150 minutes of physical activity weekly, AHA exercise recommendations given to pt in AVS/handout  . Discussed importance of healthy diet:  eating lean meats and proteins, avoiding trans fats and saturated fats, avoid simple sugars and excessive carbs in diet, eat 6 servings of fruit/vegetables daily and drink plenty of water and avoid sweet beverages.    . Recommended pt to do annual eye exam and routine dental exams/cleanings  . Depression, alcohol, fall screening completed as documented above and per flowsheets  . Reviewed Health Maintenance: Health Maintenance  Topic Date Due  . COLONOSCOPY  Never done  . INFLUENZA VACCINE  09/08/2019 (Originally 01/09/2019)  . HIV Screening  09/01/2020 (Originally 02/05/1984)  . MAMMOGRAM  02/02/2020  . PAP SMEAR-Modifier  08/25/2020  . TETANUS/TDAP  03/11/2024    . Immunizations: Immunization History  Administered Date(s) Administered  . Influenza,inj,Quad PF,6+ Mos 08/02/2015, 07/22/2016,  03/17/2018  . Influenza-Unspecified 03/11/2014  . Tdap 03/11/2014      ICD-10-CM   1. Adult general medical exam  E31.54 COMPLETE METABOLIC PANEL WITH GFR    CBC with Differential/Platelet    Lipid panel  2. Tobacco use  Z72.0   3. Flank pain  R10.9 POCT urinalysis dipstick    Urine Culture  4. Encounter for screening colonoscopy  Z12.11 Ambulatory referral to Gastroenterology  5. Encounter for screening mammogram for malignant neoplasm of breast  Z12.31 MM 3D SCREEN BREAST BILATERAL  6. Elevated blood sugar  R73.9 Hemoglobin A1c   Urinalysis here is unremarkable I doubt her pain in her back is related to a kidney infection but a culture was added at and is pending I am more so suspect that she has some breakthrough muscle skeletal pain with her chronic pain syndrome.  Patient found on that she felt pain with all of the medications that she was on so I suspect that she  is forming some tolerance to the very high dose that she is already on and has some unrealistic rotations of pain management since she should be living with some pain and it would not be unusual to have pain in the back multiple times a day with her medical history and surgeries in the past.        Delsa Grana, PA-C 09/02/19 3:22 PM  [facility]

## 2019-09-02 NOTE — Patient Instructions (Addendum)
Health Maintenance  Topic Date Due  . COLONOSCOPY  Never done  . INFLUENZA VACCINE  09/08/2019 (Originally 01/09/2019)  . HIV Screening  09/01/2020 (Originally [birth year]1984)  . MAMMOGRAM  02/02/2020  . PAP SMEAR-Modifier  08/25/2020  . TETANUS/TDAP  03/11/2024    Mammogram order is put in so call and ask [place] about it, see if they have a program for the mammogram for lower cost.   Preventing Osteoporosis, Adult Osteoporosis is a condition that causes the bones to lose density. This means that the bones become thinner, and the normal spaces in bone tissue become larger. Low bone density can make the bones weak and cause them to break more easily. Osteoporosis cannot always be prevented, but you can take steps to lower your risk of developing this condition. How can this condition affect me? If you develop osteoporosis, you will be more likely to break bones in your wrist, spine, or hip. Even a minor accident or injury can be enough to break weak bones. The bones will also be slower to heal. Osteoporosis can cause other problems as well, such as a stooped posture or trouble with movement. Osteoporosis can occur with aging. As you get older, you may lose bone tissue more quickly, or it may be replaced more slowly. Osteoporosis is more likely to develop if you have poor nutrition or do not get enough calcium or vitamin D. Other lifestyle factors can also play a role. By eating a well-balanced diet and making lifestyle changes, you can help keep your bones strong and healthy, lowering your chances of developing osteoporosis. What can increase my risk? The following factors may make you more likely to develop osteoporosis:  Having a family history of the condition.  Having poor nutrition or not getting enough calcium or vitamin D.  Using certain medicines, such as steroid medicines or antiseizure medicines.  Being any of the following: ? 53 years of age or older. ? Female. ? A woman who has  gone through menopause (is postmenopausal). ? White (Caucasian) or of Asian descent.  Smoking or having a history of smoking.  Not being physically active (being sedentary).  Having a small body frame. What actions can I take to prevent this?  Get enough calcium   Make sure you get enough calcium every day. Calcium is the most important mineral for bone health. Most people can get enough calcium from their diet, but supplements may be recommended for people who are at risk for osteoporosis. Follow these guidelines: ? If you are age 43 or younger, aim to get 1,000 mg of calcium every day. ? If you are older than age 26, aim to get 1,200 mg of calcium every day.  Good sources of calcium include: ? Dairy products, such as low-fat or nonfat milk, cheese, and yogurt. ? Dark green leafy vegetables, such as bok choy and broccoli. ? Foods that have had calcium added to them (calcium-fortified foods), such as orange juice, cereal, bread, soy beverages, and tofu products. ? Nuts, such as almonds.  Check nutrition labels to see how much calcium is in a food or drink. Get enough vitamin D  Try to get enough vitamin D every day. Vitamin D is the most essential vitamin for bone health. It helps the body absorb calcium. Follow these guidelines for how much vitamin D to get from food: ? If you are age 59 or younger, aim to get at least 600 international units (IU) every day. Your health care provider may suggest  more. ? If you are older than age 67, aim to get at least 800 international units every day. Your health care provider may suggest more.  Good sources of vitamin D in your diet include: ? Egg yolks. ? Oily fish, such as salmon, sardines, and tuna. ? Milk and cereal fortified with vitamin D.  Your body also makes vitamin D when you are out in the sun. Exposing the bare skin on your face, arms, legs, or back to the sun for no more than 30 minutes a day, 2 times a week is more than enough.  Beyond that, make sure you use sunblock to protect your skin from sunburn, which increases your risk for skin cancer. Exercise  Stay active and get exercise every day.  Ask your health care provider what types of exercise are best for you. Weight-bearing and strength-building activities are important for building and maintaining healthy bones. Some examples of these types of activities include: ? Walking and hiking. ? Jogging and running. ? Dancing. ? Gym exercises. ? Lifting weights. ? Tennis and racquetball. ? Climbing stairs. ? Aerobics. Make other lifestyle changes  Do not use any products that contain nicotine or tobacco, such as cigarettes, e-cigarettes, and chewing tobacco. If you need help quitting, ask your health care provider.  Lose weight if you are overweight.  If you drink alcohol: ? Limit how much you use to:  0-1 drink a day for nonpregnant women.  0-2 drinks a day for men. ? Be aware of how much alcohol is in your drink. In the [place] one drink equals one 12 oz bottle of beer (355 mL), one 5 oz glass of wine (148 mL), or one 1 oz glass of hard liquor (44 mL). Where to find support If you need help making changes to prevent osteoporosis, talk with your health care provider. You can ask for a referral to a diet and nutrition specialist (dietitian) and a physical therapist. Where to find more information Learn more about osteoporosis from:  NIH Osteoporosis and Related [place]: www.bones.SouthExposed.es  [place] Office on [employer] Health: [place].tn  [place]: EquipmentWeekly.com.ee Summary  Osteoporosis is a condition that causes weak bones that are more likely to break.  Eat a healthy diet, making sure you get enough calcium and vitamin D, and stay active by getting regular exercise to help prevent osteoporosis.  Other ways to reduce your risk of osteoporosis include maintaining a healthy weight and avoiding alcohol  and products that contain nicotine or tobacco. This information is not intended to replace advice given to you by your health care provider. Make sure you discuss any questions you have with your health care provider. Document Revised: 12/25/2018 Document Reviewed: 12/25/2018 Elsevier Patient Education  2020 Elsevier Inc.    Preventive Care 46-83 Years Old, Female Preventive care refers to visits with your health care provider and lifestyle choices that can promote health and wellness. This includes:  A yearly physical exam. This may also be called an annual well check.  Regular dental visits and eye exams.  Immunizations.  Screening for certain conditions.  Healthy lifestyle choices, such as eating a healthy diet, getting regular exercise, not using drugs or products that contain nicotine and tobacco, and limiting alcohol use. What can I expect for my preventive care visit? Physical exam Your health care provider will check your:  Height and weight. This may be used to calculate body mass index (BMI), which tells if you are at a healthy  weight.  Heart rate and blood pressure.  Skin for abnormal spots. Counseling Your health care provider may ask you questions about your:  Alcohol, tobacco, and drug use.  Emotional well-being.  Home and relationship well-being.  Sexual activity.  Eating habits.  Work and work Statistician.  Method of birth control.  Menstrual cycle.  Pregnancy history. What immunizations do I need?  Influenza (flu) vaccine  This is recommended every year. Tetanus, diphtheria, and pertussis (Tdap) vaccine  You may need a Td booster every 10 years. Varicella (chickenpox) vaccine  You may need this if you have not been vaccinated. Zoster (shingles) vaccine  You may need this after age 15. Measles, mumps, and rubella (MMR) vaccine  You may need at least one dose of MMR if you were born in 1957 or later. You may also need a second  dose. Pneumococcal conjugate (PCV13) vaccine  You may need this if you have certain conditions and were not previously vaccinated. Pneumococcal polysaccharide (PPSV23) vaccine  You may need one or two doses if you smoke cigarettes or if you have certain conditions. Meningococcal conjugate (MenACWY) vaccine  You may need this if you have certain conditions. Hepatitis A vaccine  You may need this if you have certain conditions or if you travel or work in places where you may be exposed to hepatitis A. Hepatitis B vaccine  You may need this if you have certain conditions or if you travel or work in places where you may be exposed to hepatitis B. Haemophilus influenzae type b (Hib) vaccine  You may need this if you have certain conditions. Human papillomavirus (HPV) vaccine  If recommended by your health care provider, you may need three doses over 6 months. You may receive vaccines as individual doses or as more than one vaccine together in one shot (combination vaccines). Talk with your health care provider about the risks and benefits of combination vaccines. What tests do I need? Blood tests  Lipid and cholesterol levels. These may be checked every 5 years, or more frequently if you are over 70 years old.  Hepatitis C test.  Hepatitis B test. Screening  Lung cancer screening. You may have this screening every year starting at age 86 if you have a 30-pack-year history of smoking and currently smoke or have quit within the past 15 years.  Colorectal cancer screening. All adults should have this screening starting at age 40 and continuing until age 57. Your health care provider may recommend screening at age 92 if you are at increased risk. You will have tests every 1-10 years, depending on your results and the type of screening test.  Diabetes screening. This is done by checking your blood sugar (glucose) after you have not eaten for a while (fasting). You may have this done every  1-3 years.  Mammogram. This may be done every 1-2 years. Talk with your health care provider about when you should start having regular mammograms. This may depend on whether you have a family history of breast cancer.  BRCA-related cancer screening. This may be done if you have a family history of breast, ovarian, tubal, or peritoneal cancers.  Pelvic exam and Pap test. This may be done every 3 years starting at age 11. Starting at age 3, this may be done every 5 years if you have a Pap test in combination with an HPV test. Other tests  Sexually transmitted disease (STD) testing.  Bone density scan. This is done to screen for osteoporosis. You  may have this scan if you are at high risk for osteoporosis. Follow these instructions at home: Eating and drinking  Eat a diet that includes fresh fruits and vegetables, whole grains, lean protein, and low-fat dairy.  Take vitamin and mineral supplements as recommended by your health care provider.  Do not drink alcohol if: ? Your health care provider tells you not to drink. ? You are pregnant, may be pregnant, or are planning to become pregnant.  If you drink alcohol: ? Limit how much you have to 0-1 drink a day. ? Be aware of how much alcohol is in your drink. In the [place] one drink equals one 12 oz bottle of beer (355 mL), one 5 oz glass of wine (148 mL), or one 1 oz glass of hard liquor (44 mL). Lifestyle  Take daily care of your teeth and gums.  Stay active. Exercise for at least 30 minutes on 5 or more days each week.  Do not use any products that contain nicotine or tobacco, such as cigarettes, e-cigarettes, and chewing tobacco. If you need help quitting, ask your health care provider.  If you are sexually active, practice safe sex. Use a condom or other form of birth control (contraception) in order to prevent pregnancy and STIs (sexually transmitted infections).  If told by your health care provider, take low-dose aspirin daily  starting at age 49. What's next?  Visit your health care provider once a year for a well check visit.  Ask your health care provider how often you should have your eyes and teeth checked.  Stay up to date on all vaccines. This information is not intended to replace advice given to you by your health care provider. Make sure you discuss any questions you have with your health care provider. Document Revised: 02/05/2018 Document Reviewed: 02/05/2018 Elsevier Patient Education  2020 [employer].

## 2019-09-03 LAB — URINE CULTURE
MICRO NUMBER:: 10294050
Result:: NO GROWTH
SPECIMEN QUALITY:: ADEQUATE

## 2019-09-07 LAB — CBC WITH DIFFERENTIAL/PLATELET
Absolute Monocytes: 454 cells/uL (ref 200–950)
Basophils Absolute: 45 cells/uL (ref 0–200)
Basophils Relative: 0.5 %
Eosinophils Absolute: 294 cells/uL (ref 15–500)
Eosinophils Relative: 3.3 %
HCT: 44.2 % (ref 35.0–45.0)
Hemoglobin: 14.6 g/dL (ref 11.7–15.5)
Lymphs Abs: 4432 cells/uL — ABNORMAL HIGH (ref 850–3900)
MCH: 29.3 pg (ref 27.0–33.0)
MCHC: 33 g/dL (ref 32.0–36.0)
MCV: 88.8 fL (ref 80.0–100.0)
MPV: 10.6 fL (ref 7.5–12.5)
Monocytes Relative: 5.1 %
Neutro Abs: 3676 cells/uL (ref 1500–7800)
Neutrophils Relative %: 41.3 %
Platelets: 227 10*3/uL (ref 140–400)
RBC: 4.98 10*6/uL (ref 3.80–5.10)
RDW: 12.3 % (ref 11.0–15.0)
Total Lymphocyte: 49.8 %
WBC: 8.9 10*3/uL (ref 3.8–10.8)

## 2019-09-07 LAB — COMPLETE METABOLIC PANEL WITH GFR
AG Ratio: 2.6 (calc) — ABNORMAL HIGH (ref 1.0–2.5)
ALT: 16 U/L (ref 6–29)
AST: 16 U/L (ref 10–35)
Albumin: 4.5 g/dL (ref 3.6–5.1)
Alkaline phosphatase (APISO): 52 U/L (ref 37–153)
BUN: 19 mg/dL (ref 7–25)
CO2: 32 mmol/L (ref 20–32)
Calcium: 9.7 mg/dL (ref 8.6–10.4)
Chloride: 105 mmol/L (ref 98–110)
Creat: 0.93 mg/dL (ref 0.50–1.05)
GFR, Est African American: 83 mL/min/{1.73_m2} (ref >=60)
GFR, Est Non African American: 72 mL/min/{1.73_m2} (ref >=60)
Globulin: 1.7 g/dL (calc) — ABNORMAL LOW (ref 1.9–3.7)
Glucose, Bld: 119 mg/dL — ABNORMAL HIGH (ref 65–99)
Potassium: 4.1 mmol/L (ref 3.5–5.3)
Sodium: 142 mmol/L (ref 135–146)
Total Bilirubin: 0.3 mg/dL (ref 0.2–1.2)
Total Protein: 6.2 g/dL (ref 6.1–8.1)

## 2019-09-07 LAB — TEST AUTHORIZATION

## 2019-09-07 LAB — HEMOGLOBIN A1C
Hgb A1c MFr Bld: 5.6 % of total Hgb (ref <5.7)
Mean Plasma Glucose: 114 (calc)
eAG (mmol/L): 6.3 (calc)

## 2019-09-07 LAB — LIPID PANEL
Cholesterol: 199 mg/dL (ref <200)
HDL: 50 mg/dL (ref >=50)
LDL Cholesterol (Calc): 118 mg/dL (calc) — ABNORMAL HIGH
Non-HDL Cholesterol (Calc): 149 mg/dL (calc) — ABNORMAL HIGH (ref <130)
Total CHOL/HDL Ratio: 4 (calc) (ref <5.0)
Triglycerides: 192 mg/dL — ABNORMAL HIGH (ref <150)

## 2020-01-03 ENCOUNTER — Telehealth (INDEPENDENT_AMBULATORY_CARE_PROVIDER_SITE_OTHER): Payer: Self-pay | Admitting: Gastroenterology

## 2020-01-03 ENCOUNTER — Other Ambulatory Visit: Payer: Self-pay

## 2020-01-03 DIAGNOSIS — Z1211 Encounter for screening for malignant neoplasm of colon: Secondary | ICD-10-CM

## 2020-01-03 NOTE — Progress Notes (Signed)
Gastroenterology Pre-Procedure Review  Request Date: Wed 08/18 Requesting Physician: Dr. Bonna Gains  PATIENT REVIEW QUESTIONS: The patient responded to the following health history questions as indicated:    1. Are you having any GI issues? no 2. Do you have a personal history of Polyps? no 3. Do you have a family history of Colon Cancer or Polyps? no 4. Diabetes Mellitus? no 5. Joint replacements in the past 12 months?no 6. Major health problems in the past 3 months?no 7. Any artificial heart valves, MVP, or defibrillator?no    MEDICATIONS & ALLERGIES:    Patient reports the following regarding taking any anticoagulation/antiplatelet therapy:   Plavix, Coumadin, Eliquis, Xarelto, Lovenox, Pradaxa, Brilinta, or Effient? no Aspirin? no  Patient confirms/reports the following medications:  Current Outpatient Medications  Medication Sig Dispense Refill  . methadone (DOLOPHINE) 10 MG tablet Take 10 mg by mouth 2 (two) times daily.    . Oxycodone HCl 10 MG TABS Take 10 mg by mouth every 6 (six) hours as needed. (Patient not taking: Reported on 01/03/2020)     No current facility-administered medications for this visit.    Patient confirms/reports the following allergies:  Allergies  Allergen Reactions  . Codeine Nausea And Vomiting    No orders of the defined types were placed in this encounter.   AUTHORIZATION INFORMATION Primary Insurance: 1D#: Group #:  Secondary Insurance: 1D#: Group #:  SCHEDULE INFORMATION: Date: Wed 08/18 Time: Location:[name]

## 2020-01-05 ENCOUNTER — Telehealth: Payer: Self-pay

## 2020-01-05 NOTE — Telephone Encounter (Signed)
Copied from [place] [phone]. Topic: General - Inquiry >> Jan 05, 2020  2:44 PM Doyce Loose D wrote: Reason for CRM: Pt needed to see about getting a referral for a new pain clinic, the clinic she attends now does not take her [facility] pt was paying out of pocket and can no longer afford to do so, Pt stated that [facility] accepts her insurance. FAX # [phone] Ms. Juliann Pulse is at [facility] who receive the fax

## 2020-01-07 NOTE — Telephone Encounter (Signed)
I cannot refer to an ortho group for pain management - they don't manage chronic pain.  If she has a specific joint/problem we can refer her to them for that.  If she wants pain management - she will need to be referred to a different pain management specialists

## 2020-01-10 NOTE — Telephone Encounter (Signed)
Pt states she spoke to someone over there but will double check and let us know for sure

## 2020-01-11 ENCOUNTER — Telehealth: Payer: Self-pay

## 2020-01-11 NOTE — Telephone Encounter (Signed)
Copied from [place] [id number]. Topic: General - Inquiry >> Jan 05, 2020  1:77 PM Doyce Loose D wrote: Reason for CRM: Pt needed to see about getting a referral for a new pain clinic, the clinic she attends now does not take her [facility] [employer] [facility] pt was paying out of pocket and can no longer afford to do so, Pt stated that [facility] accepts her insurance. FAX # [phone] Ms. [name] is at [facility] who receive the fax >> Jan 10, 2020 10:48 AM Keene Breath wrote: Patient would like the nurse to call her regarding a referral that was placed for pain management.  Please call to discuss at [phone]

## 2020-01-24 ENCOUNTER — Other Ambulatory Visit: Admission: RE | Admit: 2020-01-24 | Payer: Self-pay | Source: Ambulatory Visit

## 2020-01-26 ENCOUNTER — Encounter: Admission: RE | Payer: Self-pay | Source: Home / Self Care

## 2020-01-26 ENCOUNTER — Ambulatory Visit: Admission: RE | Admit: 2020-01-26 | Payer: Self-pay | Source: Home / Self Care | Admitting: Gastroenterology

## 2020-01-26 SURGERY — COLONOSCOPY WITH PROPOFOL
Anesthesia: General

## 2020-02-07 ENCOUNTER — Emergency Department
Admission: EM | Admit: 2020-02-07 | Discharge: 2020-02-07 | Disposition: A | Discharge status: Home / Self Care | Payer: Self-pay

## 2020-02-09 NOTE — Progress Notes (Signed)
Patient ID: [name], female    DOB: 11-10-1970, 49 y.o.   MRN: [id number]  PCP: Annette Malkin, MD  Chief Complaint  Patient presents with  . Abdominal Pain    Upper stomach pain    Subjective:   [name] is a 49 y.o. female, presents to clinic with CC of the following:  Chief Complaint  Patient presents with  . Abdominal Pain    Upper stomach pain    HPI:  Patient is a 49 year old female Initially presented to an urgent care 02/07/2020 with right abdominal pain as follows:  49 year old female comes in today with reports of gradually worsening now severe right upper quadrant abdominal pain/epigastric pain radiating through to the back. Symptoms much worse anytime she eats. She has been afraid to eat. Patient in obvious severe discomfort today.  Was then sent to ER for further evaluation, concern for gallbladder pathology  She did not stay to be seen. Follows up today with upper stomach pain  Her last visit was with [name] in March 2021 with the following noted:  [facility] [place] [facility] her chronic back pain and narcotic medicines. She is additionally having back/flank pain breaking through her pain meds and she uses [facility] to treat but this makes her feel like her kidneys are injured and hurting?  She previously thought it was her lungs and we did a visit regarding that in the past she did a chest x-Club[facility] and then she felt better she states that she looked up some anatomy to see where her pain is located and not as what convinced her that it was possibly her kidneys causing pain She is on oxycodone 20 mg tablets 180 every month, she takes every 4-6 hours.  I did review the controlled substance database.  She had diagnosis of multiple areas of chronic pain, failed back surgery.  As part of the assessment/plan, [name] noted:   Urinalysis here is unremarkable I doubt her pain in her back is related to a kidney infection but a  culture was added at and is pending I am more so suspect that she has some breakthrough muscle skeletal pain with her chronic pain syndrome.  Patient found on that she felt pain with all of the medications that she was on so I suspect that she is forming some tolerance to the very high dose that she is already on and has some unrealistic rotations of pain management since she should be living with some pain and it would not be unusual to have pain in the back multiple times a day with her medical history and surgeries in the past  Recent messages by phone call in late July and early August included the following:  Pt needed to see about getting a referral for a new pain clinic, the clinic she attends now does not take her [facility] pt was paying out of pocket and can no longer afford to do so, Pt stated that [facility] accepts her insurance. [name]'s initial response included:  I cannot refer to an ortho group for pain management - they don't manage chronic pain. If she has a specific joint/problem we can refer her to them for that.. If she wants pain management - she will need to be referred to a different pain management specialists  Patient notes that her pain started a couple months ago, intermittent and attack like after eats, noted certain foods make  it worse and if eats too much, also an issue Stays in the center when gets the pain Denies fevers, Positive nausea - mnimal, no vomiting Denies any change in bowel habits with no dark stools or black stools, no bleeding per rectum No constipation No diarrhea Denies dysuria, freq, no hematuria Noted right flank intermittently, not new and had last time seen here, no h/o kidney stones   Has tried pepto, tums, herb for stomach  Back is better recently was noted noted, still struggles with some intermittent shoulder pains, back pains, and more recently some heel pain intermittently Exercise/Activity:  Thru work, Diet/nutrition:   Eating less due to sx's Wt Readings from Last 3 Encounters:  02/10/20 178 lb 6.4 oz (80.9 kg)  09/02/19 179 lb 9.6 oz (81.5 kg)  02/02/19 177 lb (80.3 kg)  Not losing weight  Tob - 1 PPD for many years Alcohol - no alcohol  No supp,s, denied drug use  Patient Active Problem List   Diagnosis Date Noted  . Knee effusion, right 03/18/2018  . Cyst of right kidney 01/08/2018  . Tobacco use 08/25/2017  . Preventative health care 08/25/2017  . Trigger point with back pain (Left) 07/21/2017  . Acute postoperative pain 05/06/2017  . Depression 01/30/2017  . Neurogenic pain 01/14/2017  . Lumbar facet syndrome (Bilateral) 12/18/2016  . Lumbar spondylosis 12/18/2016  . Chronic musculoskeletal pain 12/17/2016  . Chronic pain syndrome 12/05/2016  . Long term (current) use of opiate analgesic 12/05/2016  . Long term prescription opiate use 12/05/2016  . Opiate use (85.5 MME/Day) 12/05/2016  . Chronic hip pain (Secondary source of pain) (Left) 12/05/2016  . Failed back surgical syndrome 12/05/2016  . Discogenic low back pain 12/05/2016  . Lumbar discogenic pain syndrome 12/05/2016  . Chronic sacroiliac joint pain (Left) 12/05/2016  . Elevated rheumatoid factor 10/22/2016  . Chronic, continuous use of opioids 10/03/2016  . Polyarthralgia 10/02/2016  . Chronic low back pain (Primary Source of Pain) (midline) 07/22/2016  . Breast cancer screening 04/03/2015  . Bladder irritation 04/03/2015  . Lumbosacral spondylosis without myelopathy 03/12/2015  . Lumbar central spinal stenosis (L4-5) 03/12/2015    Class: History of  . Adjustment disorder with depressed mood 03/10/2015  . Stressful life events affecting family and household 03/10/2015  . Vitamin D deficiency 03/10/2015      Current Outpatient Medications:  .  methadone (DOLOPHINE) 10 MG tablet, Take 10 mg by mouth 2 (two) times daily., Disp: , Rfl:    Allergies  Allergen Reactions  . Codeine Nausea And Vomiting     Past  Surgical History:  Procedure Laterality Date  . ABDOMINAL HYSTERECTOMY    . BACK SURGERY     two back sx one in [place] and one at [facility]  . BREAST CYST ASPIRATION    . CESAREAN SECTION    . SPINE SURGERY    . SPINE SURGERY  09/09/2015  . TONSILLECTOMY       Family History  Problem Relation Age of Onset  . Hypertension Mother   . Diabetes Mother   . Hypertension Father   . Hyperlipidemia Father   . Arthritis Father   . Lung disease Father   . Heart disease Father   . Lymphoma Father   . Cancer Maternal Aunt        breast  . Breast cancer Maternal Aunt 34  . Depression Sister   . Heart disease Maternal Grandfather   . Depression Sister   . Stroke Neg Hx  Social History   Tobacco Use  . Smoking status: Current Every Day Smoker    Packs/day: 1.00    Years: 25.00    Pack years: 25.00    Types: Cigarettes  . Smokeless tobacco: Never Used  . Tobacco comment: recently cut back to 3-4 cig a day, smoking since 20-30's  Substance Use Topics  . Alcohol use: No    With staff assistance, above reviewed with the patient today.  ROS: As per HPI, otherwise no specific complaints on a limited and focused system review   No results found for this or any previous visit (from the past 72 hour(s)).   PHQ2/9: Depression screen [facility] 2/9 02/10/2020 09/02/2019 06/08/2019 03/24/2018 03/17/2018  Decreased Interest 0 0 0 0 0  Down, Depressed, Hopeless 0 0 0 0 3  PHQ - 2 Score 0 0 0 0 3  Altered sleeping - 0 0 - 1  Tired, decreased energy - 0 0 - 3  Change in appetite - 0 0 - 0  Feeling bad or failure about yourself  - 0 0 - 3  Trouble concentrating - 0 0 - 0  Moving slowly or fidgety/restless - 0 0 - 0  Suicidal thoughts - 0 0 - 0  PHQ-9 Score - 0 0 - 10  Difficult doing work/chores - Not difficult at all - - Somewhat difficult  Some recent data might be hidden   PHQ-2/9 Result is neg  Fall Risk: Fall Risk  02/10/2020 09/02/2019 06/08/2019 03/24/2018 03/17/2018  Falls in the  past year? 0 0 0 No Yes  Number falls in past yr: 0 0 0 1 1  Injury with Fall? 0 0 0 No No  Comment - - - - -  Risk for fall due to : - - - - -  Risk for fall due to: Comment - - - - -  Follow up Falls evaluation completed - - - -      Objective:   Vitals:   02/10/20 0940  BP: 130/90  Pulse: 94  Resp: 16  Temp: 98.1 F (36.7 C)  TempSrc: Oral  SpO2: 98%  Weight: 178 lb 6.4 oz (80.9 kg)  Height: 5\' 7"  (1.702 m)    Body mass index is 27.94 kg/m.  Physical Exam   NAD, masked HEENT - [place]/AT, sclera anicteric, PERRL, EOMI, conj - non-inj'ed,  pharynx clear Neck - supple, no adenopathy, no rigidity Car - RRR without m/g/r, not tachycardic Pulm- RR and effort normal at rest, CTA without wheeze or rales Abd - soft, NT with no upper quadrant or epigastric tenderness on exam today, ND, BS+,  no masses, no obvious HSM Back - no CVA tenderness (does note her intermittent pain is in the right CVA region) Skin- no rash noted  on exposed areas,  Ext - no LE edema, Neuro/psychiatric - affect was not flat, appropriate with conversation  Alert with normal speech  Grossly non-focal  Results for orders placed or performed in visit on 09/02/19  Urine Culture   Specimen: Urine  Result Value Ref Range   MICRO NUMBER: [id number]    SPECIMEN QUALITY: Adequate    Sample Source URINE    STATUS: FINAL    Result: No Growth   COMPLETE METABOLIC PANEL WITH GFR  Result Value Ref Range   Glucose, Bld 119 (H) 65 - 99 mg/dL   BUN 19 7 - 25 mg/dL   Creat 0.93 0.50 - 1.05 mg/dL   GFR, Est Non African American 72 >  OR = 60 mL/min/1.19m2   GFR, Est African American 83 > OR = 60 mL/min/1.88m2   BUN/Creatinine Ratio NOT APPLICABLE 6 - 22 (calc)   Sodium 142 135 - 146 mmol/L   Potassium 4.1 3.5 - 5.3 mmol/L   Chloride 105 98 - 110 mmol/L   CO2 32 20 - 32 mmol/L   Calcium 9.7 8.6 - 10.4 mg/dL   Total Protein 6.2 6.1 - 8.1 g/dL   Albumin 4.5 3.6 - 5.1 g/dL   Globulin 1.7 (L) 1.9 - 3.7 g/dL (calc)     AG Ratio 2.6 (H) 1.0 - 2.5 (calc)   Total Bilirubin 0.3 0.2 - 1.2 mg/dL   Alkaline phosphatase (APISO) 52 37 - 153 U/L   AST 16 10 - 35 U/L   ALT 16 6 - 29 U/L  CBC with Differential/Platelet  Result Value Ref Range   WBC 8.9 3.8 - 10.8 Thousand/uL   RBC 4.98 3.80 - 5.10 Million/uL   Hemoglobin 14.6 11.7 - 15.5 g/dL   HCT 44.2 35 - 45 %   MCV 88.8 80.0 - 100.0 fL   MCH 29.3 27.0 - 33.0 pg   MCHC 33.0 32.0 - 36.0 g/dL   RDW 12.3 11.0 - 15.0 %   Platelets 227 140 - 400 Thousand/uL   MPV 10.6 7.5 - 12.5 fL   Neutro Abs 3,676 1,500 - 7,800 cells/uL   Lymphs Abs 4,432 (H) 850 - 3,900 cells/uL   Absolute Monocytes 454 200 - 950 cells/uL   Eosinophils Absolute 294 15 - 500 cells/uL   Basophils Absolute 45 0 - 200 cells/uL   Neutrophils Relative % 41.3 %   Total Lymphocyte 49.8 %   Monocytes Relative 5.1 %   Eosinophils Relative 3.3 %   Basophils Relative 0.5 %  Lipid panel  Result Value Ref Range   Cholesterol 199 <200 mg/dL   HDL 50 > OR = 50 mg/dL   Triglycerides 192 (H) <150 mg/dL   LDL Cholesterol (Calc) 118 (H) mg/dL (calc)   Total CHOL/HDL Ratio 4.0 <5.0 (calc)   Non-HDL Cholesterol (Calc) 149 (H) <130 mg/dL (calc)  Hemoglobin A1c  Result Value Ref Range   Hgb A1c MFr Bld 5.6 <5.7 % of total Hgb   Mean Plasma Glucose 114 (calc)   eAG (mmol/L) 6.3 (calc)  TEST AUTHORIZATION  Result Value Ref Range   TEST NAME: HEMOGLOBIN A1c WITH eAG    TEST CODE: [id number]    CLIENT CONTACT: LESLIE SMITH    REPORT ALWAYS MESSAGE SIGNATURE    POCT urinalysis dipstick  Result Value Ref Range   Color, UA clear    Clarity, UA clear    Glucose, UA Negative Negative   Bilirubin, UA neg    Ketones, UA neg    Spec Grav, UA 1.015 1.010 - 1.025   Blood, UA neg    pH, UA 6.5 5.0 - 8.0   Protein, UA Negative Negative   Urobilinogen, UA 0.2 0.2 or 1.0 E.U./dL   Nitrite, UA negative    Leukocytes, UA Negative Negative   Appearance clear    Odor strong    Urine dip - neg  Last labs  reviewed    Assessment & Plan:   1. Flank pain Intermittent, and is not new. Urine dip obtained in the office today was negative. Do not feel any infectious concern, doubt any kidney stone issue acutely with no blood in the urine today, and was dramatic today in the office. Main concern today was her  intermittent abdominal pain. - POCT Urinalysis Dipstick - CBC with Differential/Platelet  2. Pain of upper abdomen Discussed how her symptoms seem typical for a gallbladder concern with her attack-like pain after eating noted.  Was not having symptoms of concern today and her exam unremarkable today in the office.  Also noted that reflux/acid can be playing a role with her pain in the epigastric region noted, and increased acid can occur after eating.  She denied any reflux symptoms up the sternum.  Also discussed potential pancreatitis as a source, and she denied any alcohol use in her past recently.  Can be related to a gallbladder issue noted.  She denied any constipation issues, no diarrhea, no fevers, no dark or black stools or nausea or vomiting. Felt best to check some labs today as ordered Also will check an abdominal ultrasound, with attention to the gallbladder. Felt best to add an omeprazole product, take once daily and assess the response. Try to stay bland with her diet presently and stay well-hydrated.  - Amylase - Lipase - COMPLETE METABOLIC PANEL WITH GFR - CBC with Differential/Platelet - US Abdomen Limited RUQ; Future - omeprazole (PRILOSEC) 20 MG capsule; Take 1 capsule (20 mg total) by mouth daily.  Dispense: 30 capsule; Refill: 1  3. Tobacco use Continues to use tobacco, and noted she has a grandchild coming soon and is committed to trying to stop smoking.  Encouraged trying to lessen the numbers per day to help initially  4. Other chronic pain Noted her history as above. She noted her back is actually been feeling better recently which is encouraging.   Await  results of the tests above, and response to an omeprazole product added. We will follow-up after the tests have been obtained.   Annette Malkin, MD 02/10/20 9:49 AM

## 2020-02-10 ENCOUNTER — Encounter: Payer: Self-pay | Admitting: Internal Medicine

## 2020-02-10 ENCOUNTER — Other Ambulatory Visit: Payer: Self-pay

## 2020-02-10 ENCOUNTER — Ambulatory Visit: Payer: 59 | Admitting: Internal Medicine

## 2020-02-10 VITALS — BP 130/90 | HR 94 | Temp 98.1°F | Resp 16 | Ht 67.0 in | Wt 178.4 lb

## 2020-02-10 DIAGNOSIS — R1010 Upper abdominal pain, unspecified: Secondary | ICD-10-CM

## 2020-02-10 DIAGNOSIS — G8929 Other chronic pain: Secondary | ICD-10-CM | POA: Diagnosis not present

## 2020-02-10 DIAGNOSIS — R109 Unspecified abdominal pain: Secondary | ICD-10-CM

## 2020-02-10 DIAGNOSIS — Z720 Tobacco use: Secondary | ICD-10-CM

## 2020-02-10 LAB — POCT URINALYSIS DIPSTICK
Bilirubin, UA: NEGATIVE
Blood, UA: NEGATIVE
Glucose, UA: NEGATIVE
Ketones, UA: NEGATIVE
Leukocytes, UA: NEGATIVE
Nitrite, UA: NEGATIVE
Protein, UA: NEGATIVE
Spec Grav, UA: 1.02 (ref 1.010–1.025)
Urobilinogen, UA: 0.2 E.U./dL
pH, UA: 5 (ref 5.0–8.0)

## 2020-02-10 MED ORDER — OMEPRAZOLE 20 MG PO CPDR: 20.0000 mg | DELAYED_RELEASE_CAPSULE | Freq: Every day | ORAL | 1 refills | Status: DC

## 2020-02-10 NOTE — Patient Instructions (Signed)
Please obtain the ultrasound that was ordered today  A prescription for omeprazole was provided to your pharmacy to start once daily  Await the results of the lab tests that were done today

## 2020-02-11 LAB — CBC WITH DIFFERENTIAL/PLATELET
Absolute Monocytes: 420 cells/uL (ref 200–950)
Basophils Absolute: 38 cells/uL (ref 0–200)
Basophils Relative: 0.5 %
Eosinophils Absolute: 240 cells/uL (ref 15–500)
Eosinophils Relative: 3.2 %
HCT: 44.1 % (ref 35.0–45.0)
Hemoglobin: 14.7 g/dL (ref 11.7–15.5)
Lymphs Abs: 3000 cells/uL (ref 850–3900)
MCH: 29.3 pg (ref 27.0–33.0)
MCHC: 33.3 g/dL (ref 32.0–36.0)
MCV: 87.8 fL (ref 80.0–100.0)
MPV: 10.3 fL (ref 7.5–12.5)
Monocytes Relative: 5.6 %
Neutro Abs: 3803 cells/uL (ref 1500–7800)
Neutrophils Relative %: 50.7 %
Platelets: 230 10*3/uL (ref 140–400)
RBC: 5.02 10*6/uL (ref 3.80–5.10)
RDW: 12.4 % (ref 11.0–15.0)
Total Lymphocyte: 40 %
WBC: 7.5 10*3/uL (ref 3.8–10.8)

## 2020-02-11 LAB — AMYLASE: Amylase: 18 U/L — ABNORMAL LOW (ref 21–101)

## 2020-02-11 LAB — COMPLETE METABOLIC PANEL WITH GFR
AG Ratio: 2 (calc) (ref 1.0–2.5)
ALT: 18 U/L (ref 6–29)
AST: 16 U/L (ref 10–35)
Albumin: 4.3 g/dL (ref 3.6–5.1)
Alkaline phosphatase (APISO): 60 U/L (ref 37–153)
BUN: 23 mg/dL (ref 7–25)
CO2: 27 mmol/L (ref 20–32)
Calcium: 9.6 mg/dL (ref 8.6–10.4)
Chloride: 102 mmol/L (ref 98–110)
Creat: 0.85 mg/dL (ref 0.50–1.05)
GFR, Est African American: 92 mL/min/{1.73_m2} (ref >=60)
GFR, Est Non African American: 79 mL/min/{1.73_m2} (ref >=60)
Globulin: 2.2 g/dL (calc) (ref 1.9–3.7)
Glucose, Bld: 160 mg/dL — ABNORMAL HIGH (ref 65–99)
Potassium: 3.9 mmol/L (ref 3.5–5.3)
Sodium: 139 mmol/L (ref 135–146)
Total Bilirubin: 0.5 mg/dL (ref 0.2–1.2)
Total Protein: 6.5 g/dL (ref 6.1–8.1)

## 2020-02-11 LAB — LIPASE: Lipase: 20 U/L (ref 7–60)

## 2020-02-23 ENCOUNTER — Telehealth: Payer: Self-pay | Admitting: Internal Medicine

## 2020-02-23 NOTE — Telephone Encounter (Signed)
I called [name] and spoke to [name], was told no prior [name] was needed but that they had to make sure [name] was in network. After he checked, he found that they were and that I was able to proceed with scheduling the U/S.  Patient has been scheduled for 03/01/20 @ 8am at [facility]. She was informed to arrive by 7:45am and not to have anything to eat or drink after midnight. Patient agreed and said thanks.

## 2020-02-23 NOTE — Telephone Encounter (Signed)
Pt called in asking about the u/s she is to have and I called Kristeen Miss to ask about it and she said that the patients insurance is [facility] and it usually takes them around 30 days to do a Prior Auth. The request was sent n Sept 07-2019. Told patient that she could give them a call and see if they could put a rush on it.

## 2020-03-01 ENCOUNTER — Other Ambulatory Visit: Payer: Self-pay

## 2020-03-01 ENCOUNTER — Other Ambulatory Visit: Payer: Self-pay | Admitting: Internal Medicine

## 2020-03-01 ENCOUNTER — Ambulatory Visit
Admission: RE | Admit: 2020-03-01 | Discharge: 2020-03-01 | Disposition: A | Discharge status: Home / Self Care | Payer: 59 | Source: Ambulatory Visit | Attending: Internal Medicine | Admitting: Internal Medicine

## 2020-03-01 ENCOUNTER — Telehealth: Payer: Self-pay

## 2020-03-01 DIAGNOSIS — R109 Unspecified abdominal pain: Secondary | ICD-10-CM

## 2020-03-01 DIAGNOSIS — K8020 Calculus of gallbladder without cholecystitis without obstruction: Secondary | ICD-10-CM

## 2020-03-01 DIAGNOSIS — R1010 Upper abdominal pain, unspecified: Secondary | ICD-10-CM | POA: Insufficient documentation

## 2020-03-01 NOTE — Progress Notes (Signed)
Ultrasound results reviewed. Referral to GI provided.

## 2020-03-01 NOTE — Telephone Encounter (Signed)
U/S abdomen is in, showing multiple gallstones

## 2020-03-03 NOTE — Telephone Encounter (Signed)
Pt called asking if someone will please call her Monday and update her on the referral status.  CB#  [phone]

## 2020-03-06 NOTE — Telephone Encounter (Signed)
Copied from [place] [id number]. Topic: Referral - Status >> Feb 18, 2020  9:02 AM Alanda Slim E wrote: Reason for CRM: Pt stated she has been waiting on referral for a scan for weeks and has heard nothing/ Advised Pt it was pending review/ Pt asked for the referral coordinator, provider or nurse to give her a call to see what the hold up is or if referral can be sent somewhere else/ please advise asap

## 2020-03-06 NOTE — Telephone Encounter (Signed)
An ultrasound was ordered for the patient and she was given her results on 03/02/2020. Dr. Roxan Hockey placed a referral order for GI to discuss best next steps and possible removal of gallbladder.

## 2020-03-07 ENCOUNTER — Telehealth: Payer: Self-pay

## 2020-03-07 DIAGNOSIS — R109 Unspecified abdominal pain: Secondary | ICD-10-CM

## 2020-03-07 DIAGNOSIS — K8020 Calculus of gallbladder without cholecystitis without obstruction: Secondary | ICD-10-CM

## 2020-03-07 NOTE — Telephone Encounter (Signed)
Referral for General Surgery put in, patient made aware.   Copied from [place] [id number]. Topic: General - Other >> Mar 06, 2020  4:41 PM Rainey Pines A wrote: Patient would like clarification and status update from Dr .Roxan Hockey nurse on the gi referral for the possible removel of her gallbladder. Patient reached out to [facility] and they stated "they do not do gallbladders". Please advise >> Mar 07, 2020  9:28 AM Scherrie Gerlach wrote: Pt following up on request for a referral to have gallbladder removed. GI doctor does not do gallbladders.  Needs to be general surgery, Pt would like referral to Fredirick Maudlin, MD with [facility] at [facility]

## 2020-03-10 ENCOUNTER — Encounter: Payer: Self-pay | Admitting: General Practice

## 2020-03-14 ENCOUNTER — Encounter: Payer: Self-pay | Admitting: Surgery

## 2020-03-14 ENCOUNTER — Other Ambulatory Visit: Payer: Self-pay

## 2020-03-14 ENCOUNTER — Ambulatory Visit (INDEPENDENT_AMBULATORY_CARE_PROVIDER_SITE_OTHER): Payer: 59 | Admitting: Surgery

## 2020-03-14 ENCOUNTER — Ambulatory Visit: Payer: Self-pay | Admitting: Surgery

## 2020-03-14 VITALS — BP 109/75 | HR 76 | Temp 98.3°F | Ht 67.0 in | Wt 176.0 lb

## 2020-03-14 DIAGNOSIS — K8010 Calculus of gallbladder with chronic cholecystitis without obstruction: Secondary | ICD-10-CM | POA: Insufficient documentation

## 2020-03-14 DIAGNOSIS — R1011 Right upper quadrant pain: Secondary | ICD-10-CM

## 2020-03-14 NOTE — Patient Instructions (Signed)
You have requested to have your gallbladder removed. This will be done at [facility] with Dr. Rodenberg.  You will most likely be out of work 1-2 weeks for this surgery. You will return after your post-op appointment with a lifting restriction for approximately 4 more weeks.  You will be able to eat anything you would like to following surgery. But, start by eating a bland diet and advance this as tolerated. The Gallbladder diet is below, please go as closely by this diet as possible prior to surgery to avoid any further attacks.  Please see the (blue)pre-care form that you have been given today. If you have any questions, please call our office.  Laparoscopic Cholecystectomy Laparoscopic cholecystectomy is surgery to remove the gallbladder. The gallbladder is located in the upper right part of the abdomen, behind the liver. It is a storage sac for bile, which is produced in the liver. Bile aids in the digestion and absorption of fats. Cholecystectomy is often done for inflammation of the gallbladder (cholecystitis). This condition is usually caused by a buildup of gallstones (cholelithiasis) in the gallbladder. Gallstones can block the flow of bile, and that can result in inflammation and pain. In severe cases, emergency surgery may be required. If emergency surgery is not required, you will have time to prepare for the procedure. Laparoscopic surgery is an alternative to open surgery. Laparoscopic surgery has a shorter recovery time. Your common bile duct may also need to be examined during the procedure. If stones are found in the common bile duct, they may be removed. LET YOUR HEALTH CARE PROVIDER KNOW ABOUT: Any allergies you have. All medicines you are taking, including vitamins, herbs, eye drops, creams, and over-the-counter medicines. Previous problems you or members of your family have had with the use of anesthetics. Any blood disorders you have. Previous surgeries you have  had.  Any medical conditions you have. RISKS AND COMPLICATIONS Generally, this is a safe procedure. However, problems may occur, including: Infection. Bleeding. Allergic reactions to medicines. Damage to other structures or organs. A stone remaining in the common bile duct. A bile leak from the cyst duct that is clipped when your gallbladder is removed. The need to convert to open surgery, which requires a larger incision in the abdomen. This may be necessary if your surgeon thinks that it is not safe to continue with a laparoscopic procedure. BEFORE THE PROCEDURE Ask your health care provider about: Changing or stopping your regular medicines. This is especially important if you are taking diabetes medicines or blood thinners. Taking medicines such as aspirin and ibuprofen. These medicines can thin your blood. Do not take these medicines before your procedure if your health care provider instructs you not to. Follow instructions from your health care provider about eating or drinking restrictions. Let your health care provider know if you develop a cold or an infection before surgery. Plan to have someone take you home after the procedure. Ask your health care provider how your surgical site will be marked or identified. You may be given antibiotic medicine to help prevent infection. PROCEDURE To reduce your risk of infection: Your health care team will wash or sanitize their hands. Your skin will be washed with soap. An IV tube may be inserted into one of your veins. You will be given a medicine to make you fall asleep (general anesthetic). A breathing tube will be placed in your mouth. The surgeon will make several small cuts (incisions) in your abdomen. A thin, lighted tube (  laparoscope) that has a tiny camera on the end will be inserted through one of the small incisions. The camera on the laparoscope will send a picture to a TV screen (monitor) in the operating room. This will give  the surgeon a good view inside your abdomen. A gas will be pumped into your abdomen. This will expand your abdomen to give the surgeon more room to perform the surgery. Other tools that are needed for the procedure will be inserted through the other incisions. The gallbladder will be removed through one of the incisions. After your gallbladder has been removed, the incisions will be closed with stitches (sutures), staples, or skin glue. Your incisions may be covered with a bandage (dressing). The procedure may vary among health care providers and hospitals. AFTER THE PROCEDURE Your blood pressure, heart rate, breathing rate, and blood oxygen level will be monitored often until the medicines you were given have worn off. You will be given medicines as needed to control your pain.   This information is not intended to replace advice given to you by your health care provider. Make sure you discuss any questions you have with your health care provider.   Document Released: 05/27/2005 Document Revised: 02/15/2015 Document Reviewed: 01/06/2013 Elsevier Interactive Patient Education 2016 Elsevier Inc.   Low-Fat Diet for Gallbladder Conditions A low-fat diet can be helpful if you have pancreatitis or a gallbladder condition. With these conditions, your pancreas and gallbladder have trouble digesting fats. A healthy eating plan with less fat will help rest your pancreas and gallbladder and reduce your symptoms. WHAT DO I NEED TO KNOW ABOUT THIS DIET? Eat a low-fat diet. Reduce your fat intake to less than 20-30% of your total daily calories. This is less than 50-60 g of fat per day. Remember that you need some fat in your diet. Ask your dietician what your daily goal should be. Choose nonfat and low-fat healthy foods. Look for the words "nonfat," "low fat," or "fat free." As a guide, look on the label and choose foods with less than 3 g of fat per serving. Eat only one serving. Avoid alcohol. Do not  smoke. If you need help quitting, talk with your health care provider. Eat small frequent meals instead of three large heavy meals. WHAT FOODS CAN I EAT? Grains Include healthy grains and starches such as potatoes, wheat bread, fiber-rich cereal, and brown rice. Choose whole grain options whenever possible. In adults, whole grains should account for 45-65% of your daily calories.  Fruits and Vegetables Eat plenty of fruits and vegetables. Fresh fruits and vegetables add fiber to your diet. Meats and Other Protein Sources Eat lean meat such as chicken and pork. Trim any fat off of meat before cooking it. Eggs, fish, and beans are other sources of protein. In adults, these foods should account for 10-35% of your daily calories. Dairy Choose low-fat milk and dairy options. Dairy includes fat and protein, as well as calcium.  Fats and Oils Limit high-fat foods such as fried foods, sweets, baked goods, sugary drinks.  Other Creamy sauces and condiments, such as mayonnaise, can add extra fat. Think about whether or not you need to use them, or use smaller amounts or low fat options. WHAT FOODS ARE NOT RECOMMENDED? High fat foods, such as: Baked goods. Ice cream. French toast. Sweet rolls. Pizza. Cheese bread. Foods covered with batter, butter, creamy sauces, or cheese. Fried foods. Sugary drinks and desserts. Foods that cause gas or bloating   This information   is not intended to replace advice given to you by your health care provider. Make sure you discuss any questions you have with your health care provider.   Document Released: 06/01/2013 Document Reviewed: 06/01/2013 Elsevier Interactive Patient Education 2016 Elsevier Inc.    

## 2020-03-14 NOTE — Progress Notes (Unsigned)
MRCP scheduled 03/22/20 @ Ruth 2:30. Patient notified.

## 2020-03-14 NOTE — Progress Notes (Signed)
Patient ID: [name], female   DOB: [birth year], 49 y.o.   MRN: [id number]  Chief Complaint: Epigastric pain  History of Present Illness [name] is a 49 y.o. female with epigastric pain which radiated to the left and right costovertebral angle.  Seem to be precipitated during the weekend of eating associated with a birthday celebration.  She had an ED visit and a work-up which revealed her gallstones.  Since then she has limited her oral intake to small meals and snacking and has avoided recurring epigastric pain or gallbladder attacks.  She denies any history hepatitis or jaundice.  She presents today without pain and feeling well.  Past Medical History Past Medical History:  Diagnosis Date   Allergy    Anxiety    Arthritis    Chronic bilateral low back pain 07/22/2016   Started in her 20's; managed by pain clinic   Depression    Kidney cysts 12/21/2017   Rheumatoid arthritis ([place])       Past Surgical History:  Procedure Laterality Date   ABDOMINAL HYSTERECTOMY     BACK SURGERY     two back sx one in [place] and one at [place]  09/09/2015   TONSILLECTOMY      Allergies  Allergen Reactions   Codeine Nausea And Vomiting    Current Outpatient Medications  Medication Sig Dispense Refill   methadone (DOLOPHINE) 10 MG tablet Take 10 mg by mouth 2 (two) times daily.     No current facility-administered medications for this visit.    Family History Family History  Problem Relation Age of Onset   Hypertension Mother    Diabetes Mother    Gallbladder disease Mother    Hypertension Father    Hyperlipidemia Father    Arthritis Father    Lung disease Father    Heart disease Father    Lymphoma Father    Cancer Maternal Aunt        breast   Breast cancer Maternal Aunt 38   Depression Sister    Heart disease Maternal Grandfather    Depression Sister     Stroke Neg Hx       Social History Social History   Tobacco Use   Smoking status: Former Smoker    Packs/day: 1.00    Years: 25.00    Pack years: 25.00    Types: Cigarettes    Start date: 02/09/2020   Smokeless tobacco: Never Used   Tobacco comment: recently cut back to 3-4 cig a day, smoking since 20-30's  Vaping Use   Vaping Use: Every day  Substance Use Topics   Alcohol use: No   Drug use: No        Review of Systems  Constitutional: Negative.   HENT: Negative.   Eyes: Negative.   Respiratory: Negative.   Cardiovascular: Negative.   Gastrointestinal: Positive for abdominal pain.  Genitourinary: Negative.   Skin: Negative.   Neurological: Negative.   Psychiatric/Behavioral: Negative.       Physical Exam Blood pressure 109/75, pulse 76, temperature 98.3 F (36.8 C), height 5\' 7"  (1.702 m), weight 176 lb (79.8 kg), SpO2 98 %. Last Weight  Most recent update: 03/14/2020  2:07 PM   Weight  79.8 kg (176 lb)            CONSTITUTIONAL: Well developed, and nourished, appropriately responsive and  aware without distress.   EYES: Sclera non-icteric.   EARS, NOSE, MOUTH AND THROAT: Mask worn.     Hearing is intact to voice.  NECK: Trachea is midline, and there is no jugular venous distension.  LYMPH NODES:  Lymph nodes in the neck are not enlarged. RESPIRATORY:  Lungs are clear, and breath sounds are equal bilaterally. Normal respiratory effort without pathologic use of accessory muscles. CARDIOVASCULAR: Heart is regular in rate and rhythm. GI: The abdomen is soft, nontender, and nondistended. There were no palpable masses. I did not appreciate hepatosplenomegaly. There were normal bowel sounds. MUSCULOSKELETAL:  Symmetrical muscle tone appreciated in all four extremities.    SKIN: Skin turgor is normal. No pathologic skin lesions appreciated.  NEUROLOGIC:  Motor and sensation appear grossly normal.  Cranial nerves are grossly without defect. PSYCH:  Alert and  oriented to person, place and time. Affect is appropriate for situation.  Data Reviewed I have personally reviewed what is currently available of the patient's imaging, recent labs and medical records.   Labs:  CBC Latest Ref Rng & Units 02/10/2020 09/02/2019 03/17/2018  WBC 3.8 - 10.8 Thousand/uL 7.5 8.9 7.2  Hemoglobin 11.7 - 15.5 g/dL 14.7 14.6 14.6  Hematocrit 35 - 45 % 44.1 44.2 43.6  Platelets 140 - 400 Thousand/uL 230 227 213   CMP Latest Ref Rng & Units 02/10/2020 09/02/2019 01/08/2018  Glucose 65 - 99 mg/dL 160(H) 119(H) 101(H)  BUN 7 - 25 mg/dL 23 19 19   Creatinine 0.50 - 1.05 mg/dL 0.85 0.93 0.89  Sodium 135 - 146 mmol/L 139 142 140  Potassium 3.5 - 5.3 mmol/L 3.9 4.1 4.5  Chloride 98 - 110 mmol/L 102 105 105  CO2 20 - 32 mmol/L 27 32 27  Calcium 8.6 - 10.4 mg/dL 9.6 9.7 9.7  Total Protein 6.1 - 8.1 g/dL 6.5 6.2 6.4  Total Bilirubin 0.2 - 1.2 mg/dL 0.5 0.3 0.4  Alkaline Phos 39 - 117 IU/L - - -  AST 10 - 35 U/L 16 16 15   ALT 6 - 29 U/L 18 16 12       Imaging: Radiology review:  CLINICAL DATA:  50 year old female with intermittent upper abdominal pain for 3 weeks.  EXAM: ULTRASOUND ABDOMEN LIMITED RIGHT UPPER QUADRANT  COMPARISON:  Renal ultrasound 01/21/2018 CT Abdomen and Pelvis 07/08/2005.  FINDINGS: Gallbladder:  Numerous shadowing echogenic gallstones throughout the gallbladder (image 2), individually estimated up to 15 mm diameter. Possible superimposed sludge. Gallbladder wall thickness is at the upper limits of normal, 2-3 mm. No sonographic Percell Miller sign was elicited. No pericholecystic fluid.  Common bile duct:  Diameter: Ranges from 6 mm proximally (within normal limits, image 18) up to 10-11 mm distally (dilated, image 20). No filling defect identified within the visible duct.  Liver:  Background liver echogenicity is within normal limits. A simple appearing oval cyst of the left lobe is identified measuring 2.4 cm (images 33 and 35). No  other discrete liver lesion. No intrahepatic biliary ductal dilatation is evident. Portal vein is patent on color Doppler imaging with normal direction of blood flow towards the liver.  Other: Negative visible right kidney.  IMPRESSION: 1. Positive for numerous Gallstones, and dilated distal CBD. Consider Choledocholithiasis. There is no strong evidence of acute cholecystitis at this time.  2. Negative ultrasound appearance of the liver; small benign left hepatic cyst.  These results will be called to the ordering clinician or representative by the Radiologist Assistant, and communication documented in the PACS or [employer].  Electronically Signed   By: Genevie Ann M.D.   On: 03/01/2020 15:13   Within last 24 hrs: No results found.  Assessment    Chronic calculus cholecystitis, consideration of choledocholithiasis secondary to enlarged distal common bile duct noted on ultrasound. Patient Active Problem List   Diagnosis Date Noted   Knee effusion, right 03/18/2018   Cyst of right kidney 01/08/2018   Tobacco use 08/25/2017   Preventative health care 08/25/2017   Trigger point with back pain (Left) 07/21/2017   Acute postoperative pain 05/06/2017   Depression 01/30/2017   Neurogenic pain 01/14/2017   Lumbar facet syndrome (Bilateral) 12/18/2016   Lumbar spondylosis 12/18/2016   Chronic musculoskeletal pain 12/17/2016   Chronic pain syndrome 12/05/2016   Long term (current) use of opiate analgesic 12/05/2016   Long term prescription opiate use 12/05/2016   Opiate use (85.5 MME/Day) 12/05/2016   Chronic hip pain (Secondary source of pain) (Left) 12/05/2016   Failed back surgical syndrome 12/05/2016   Discogenic low back pain 12/05/2016   Lumbar discogenic pain syndrome 12/05/2016   Chronic sacroiliac joint pain (Left) 12/05/2016   Elevated rheumatoid factor 10/22/2016   Chronic, continuous use of opioids 10/03/2016   Polyarthralgia  10/02/2016   Chronic low back pain (Primary Source of Pain) (midline) 07/22/2016   Breast cancer screening 04/03/2015   Bladder irritation 04/03/2015   Lumbosacral spondylosis without myelopathy 03/12/2015   Lumbar central spinal stenosis (L4-5) 03/12/2015    Class: History of   Adjustment disorder with depressed mood 03/10/2015   Stressful life events affecting family and household 03/10/2015   Vitamin D deficiency 03/10/2015    Plan    MRCP.  Assuming no other common duct pathology, consider proceeding with robotic cholecystectomy.  This was discussed thoroughly.  Optimal plan is for robotic cholecystectomy.  Risks and benefits have been discussed with the patient which include but are not limited to anesthesia, bleeding, infection, biliary ductal injury or stenosis, other associated unanticipated injuries affiliated with laparoscopic surgery.  I believe there is the desire to proceed, interpreter utilized as needed.  Questions elicited and answered to satisfaction.  No guarantees ever expressed or implied.  Face-to-face time spent with the patient and accompanying care providers(if present) was 30 minutes, with more than 50% of the time spent counseling, educating, and coordinating care of the patient.      Ronny Bacon M.D., FACS 03/14/2020, 3:07 PM

## 2020-03-16 ENCOUNTER — Telehealth: Payer: Self-pay | Admitting: Surgery

## 2020-03-16 NOTE — Telephone Encounter (Signed)
Patient has been advised of Pre-Admission date/time, COVID Testing date and Surgery date.  Surgery Date: 04/14/20 Preadmission Testing Date: 04/04/20 (phone 1p-5p) Covid Testing Date: 04/12/20 - patient advised to go to the [facility] ([place]) between 8a-1p   Patient has been made aware to call [phone], between 1-3:00pm the day before surgery, to find out what time to arrive for surgery.

## 2020-03-17 ENCOUNTER — Inpatient Hospital Stay: Admission: RE | Admit: 2020-03-17 | Payer: 59 | Source: Ambulatory Visit

## 2020-03-22 ENCOUNTER — Other Ambulatory Visit: Payer: 59

## 2020-03-22 ENCOUNTER — Ambulatory Visit: Payer: 59

## 2020-03-29 ENCOUNTER — Other Ambulatory Visit: Payer: Self-pay

## 2020-03-29 ENCOUNTER — Ambulatory Visit
Admission: RE | Admit: 2020-03-29 | Discharge: 2020-03-29 | Disposition: A | Discharge status: Home / Self Care | Payer: 59 | Source: Ambulatory Visit | Attending: Surgery | Admitting: Surgery

## 2020-03-29 DIAGNOSIS — R1011 Right upper quadrant pain: Secondary | ICD-10-CM

## 2020-03-29 MED ORDER — GADOBUTROL 1 MMOL/ML IV SOLN
7.0000 mL | Freq: Once | INTRAVENOUS | Status: AC | PRN
  Administered 2020-03-29: 7 mL via INTRAVENOUS

## 2020-04-04 ENCOUNTER — Inpatient Hospital Stay: Admission: RE | Admit: 2020-04-04 | Payer: 59 | Source: Ambulatory Visit

## 2020-04-04 ENCOUNTER — Encounter: Admission: RE | Admit: 2020-04-04 | Payer: 59 | Source: Ambulatory Visit

## 2020-04-06 ENCOUNTER — Encounter: Admission: RE | Admit: 2020-04-06 | Payer: 59 | Source: Ambulatory Visit

## 2020-04-10 ENCOUNTER — Encounter
Admission: RE | Admit: 2020-04-10 | Discharge: 2020-04-10 | Disposition: A | Discharge status: Home / Self Care | Payer: 59 | Source: Ambulatory Visit | Attending: Surgery | Admitting: Surgery

## 2020-04-10 ENCOUNTER — Other Ambulatory Visit: Payer: Self-pay

## 2020-04-10 DIAGNOSIS — Z01818 Encounter for other preprocedural examination: Secondary | ICD-10-CM | POA: Insufficient documentation

## 2020-04-10 NOTE — Patient Instructions (Signed)
Your procedure is scheduled on: Friday 04/13/20.  Report to DAY SURGERY DEPARTMENT LOCATED ON 2ND FLOOR MEDICAL MALL ENTRANCE. To find out your arrival time please call [phone] between 1PM - 3PM on Thursday 04/12/20.   Remember: Instructions that are not followed completely may result in serious medical risk, up to and including death, or upon the discretion of your surgeon and anesthesiologist your surgery may need to be rescheduled.     __X__ 1. Do not eat food after midnight the night before your procedure.                 No gum chewing or hard candies. You may drink clear liquids up to 2 hours                 before you are scheduled to arrive for your surgery- DO NOT drink clear                 liquids within 2 hours of the start of your surgery.                 Clear Liquids include:  water, apple juice without pulp, clear carbohydrate                 drink such as Clearfast or Gatorade, Black Coffee or Tea (Do not add                 milk or creamer to coffee or tea).  __X__2.  On the morning of surgery brush your teeth with toothpaste and water, you may rinse your mouth with mouthwash if you wish.  Do not swallow any toothpaste or mouthwash.    __X__ 3.  No Alcohol for 24 hours before or after surgery.  __X__ 4.  Do Not Smoke or use e-cigarettes For 24 Hours Prior to Your Surgery.                 Do not use any chewable tobacco products for at least 6 hours prior to                 surgery.  __X__5.  Notify your doctor if there is any change in your medical condition      (cold, fever, infections).      Do NOT wear jewelry, make-up, hairpins, clips or nail polish. Do NOT wear lotions, powders, or perfumes.  Do NOT shave 48 hours prior to surgery. Men may shave face and neck. Do NOT bring valuables to the hospital.     [facility] is not responsible for any belongings or valuables.   Contacts, dentures/partials or body piercings may not be worn into surgery. Bring a  case for your contacts, glasses or hearing aids, a denture cup will be supplied.    Patients discharged the day of surgery will not be allowed to drive home.     __X__ Take these medicines the morning of surgery with A SIP OF WATER:     1. methadone   2. Oxycodone     __X__ Fleet Enema (as directed)   __X__ Use CHG Soap as directed.  __X__ Stop Anti-inflammatories 7 days before surgery such as Advil, Ibuprofen, Motrin, BC or Goodies Powder, Naprosyn, Naproxen, Aleve, Aspirin, Meloxicam. May take Tylenol if needed for pain or discomfort.   __X__Do not start taking any new herbal supplements or vitamins prior to your procedure.  __X__ Stop the following herbal supplements or vitamins: TURMERIC PO   Wear comfortable  clothing (specific to your surgery type) to the hospital.  Plan for stool softeners for home use; pain medications have a tendency to cause constipation. You can also help prevent constipation by eating foods high in fiber such as fruits and vegetables and drinking plenty of fluids as your diet allows.  After surgery, you can prevent lung complications by doing breathing exercises.Take deep breaths and cough every 1-2 hours. Your doctor may order a device called an Incentive Spirometer to help you take deep breaths.  Please call the [facility] Department at [phone] if you have any questions about these instructions.

## 2020-04-12 ENCOUNTER — Other Ambulatory Visit: Payer: Self-pay

## 2020-04-12 ENCOUNTER — Other Ambulatory Visit
Admission: RE | Admit: 2020-04-12 | Discharge: 2020-04-12 | Disposition: A | Discharge status: Home / Self Care | Payer: 59 | Source: Ambulatory Visit | Attending: Surgery | Admitting: Surgery

## 2020-04-12 DIAGNOSIS — Z20822 Contact with and (suspected) exposure to covid-19: Secondary | ICD-10-CM | POA: Insufficient documentation

## 2020-04-12 DIAGNOSIS — Z01812 Encounter for preprocedural laboratory examination: Secondary | ICD-10-CM | POA: Diagnosis present

## 2020-04-12 LAB — CBC WITH DIFFERENTIAL/PLATELET
Abs Immature Granulocytes: 0.01 10*3/uL (ref 0.00–0.07)
Basophils Absolute: 0 10*3/uL (ref 0.0–0.1)
Basophils Relative: 1 %
Eosinophils Absolute: 0.3 10*3/uL (ref 0.0–0.5)
Eosinophils Relative: 4 %
HCT: 43.5 % (ref 36.0–46.0)
Hemoglobin: 14.1 g/dL (ref 12.0–15.0)
Immature Granulocytes: 0 %
Lymphocytes Relative: 43 %
Lymphs Abs: 3.3 10*3/uL (ref 0.7–4.0)
MCH: 29.3 pg (ref 26.0–34.0)
MCHC: 32.4 g/dL (ref 30.0–36.0)
MCV: 90.2 fL (ref 80.0–100.0)
Monocytes Absolute: 0.5 10*3/uL (ref 0.1–1.0)
Monocytes Relative: 6 %
Neutro Abs: 3.6 10*3/uL (ref 1.7–7.7)
Neutrophils Relative %: 46 %
Platelets: 234 10*3/uL (ref 150–400)
RBC: 4.82 MIL/uL (ref 3.87–5.11)
RDW: 12.7 % (ref 11.5–15.5)
WBC: 7.6 10*3/uL (ref 4.0–10.5)
nRBC: 0 % (ref 0.0–0.2)

## 2020-04-12 LAB — COMPREHENSIVE METABOLIC PANEL
ALT: 18 U/L (ref 0–44)
AST: 18 U/L (ref 15–41)
Albumin: 4.1 g/dL (ref 3.5–5.0)
Alkaline Phosphatase: 47 U/L (ref 38–126)
Anion gap: 10 (ref 5–15)
BUN: 20 mg/dL (ref 6–20)
CO2: 27 mmol/L (ref 22–32)
Calcium: 9.5 mg/dL (ref 8.9–10.3)
Chloride: 101 mmol/L (ref 98–111)
Creatinine, Ser: 0.85 mg/dL (ref 0.44–1.00)
GFR, Estimated: >60 mL/min (ref >60)
Glucose, Bld: 121 mg/dL — ABNORMAL HIGH (ref 70–99)
Potassium: 4 mmol/L (ref 3.5–5.1)
Sodium: 138 mmol/L (ref 135–145)
Total Bilirubin: 0.5 mg/dL (ref 0.3–1.2)
Total Protein: 6.8 g/dL (ref 6.5–8.1)

## 2020-04-12 LAB — SARS CORONAVIRUS 2 (TAT 6-24 HRS): SARS Coronavirus 2: NEGATIVE

## 2020-04-13 MED ORDER — ORAL CARE MOUTH RINSE: 15.0000 mL | Freq: Once | OROMUCOSAL | Status: AC

## 2020-04-13 MED ORDER — CELECOXIB 200 MG PO CAPS: 200.0000 mg | ORAL_CAPSULE | ORAL | Status: AC

## 2020-04-13 MED ORDER — LACTATED RINGERS IV SOLN: INTRAVENOUS | Status: DC

## 2020-04-13 MED ORDER — INDOCYANINE GREEN 25 MG IV SOLR
2.5000 mg | Freq: Once | INTRAVENOUS | Status: AC
  Administered 2020-04-14: 2.5 mg via INTRAVENOUS
  Filled 2020-04-13: qty 10

## 2020-04-13 MED ORDER — ACETAMINOPHEN 500 MG PO TABS: 1000.0000 mg | ORAL_TABLET | ORAL | Status: AC

## 2020-04-13 MED ORDER — GABAPENTIN 300 MG PO CAPS: 300.0000 mg | ORAL_CAPSULE | ORAL | Status: AC

## 2020-04-13 MED ORDER — BUPIVACAINE LIPOSOME 1.3 % IJ SUSP: 20.0000 mL | Freq: Once | INTRAMUSCULAR | Status: DC

## 2020-04-13 MED ORDER — CEFAZOLIN SODIUM-DEXTROSE 2-4 GM/100ML-% IV SOLN
2.0000 g | INTRAVENOUS | Status: AC
  Administered 2020-04-14: 2 g via INTRAVENOUS

## 2020-04-13 MED ORDER — FAMOTIDINE 20 MG PO TABS: 20.0000 mg | ORAL_TABLET | Freq: Once | ORAL | Status: AC

## 2020-04-13 MED ORDER — CHLORHEXIDINE GLUCONATE 0.12 % MT SOLN: 15.0000 mL | Freq: Once | OROMUCOSAL | Status: AC

## 2020-04-14 ENCOUNTER — Ambulatory Visit: Payer: 59 | Admitting: Anesthesiology

## 2020-04-14 ENCOUNTER — Encounter
Admission: RE | Disposition: A | Discharge status: Home / Self Care | Payer: Self-pay | Source: Home / Self Care | Attending: Surgery

## 2020-04-14 ENCOUNTER — Other Ambulatory Visit: Payer: Self-pay

## 2020-04-14 ENCOUNTER — Ambulatory Visit
Admission: RE | Admit: 2020-04-14 | Discharge: 2020-04-14 | Disposition: A | Discharge status: Home / Self Care | Payer: 59 | Attending: Surgery | Admitting: Surgery

## 2020-04-14 ENCOUNTER — Encounter: Payer: Self-pay | Admitting: Surgery

## 2020-04-14 DIAGNOSIS — F17210 Nicotine dependence, cigarettes, uncomplicated: Secondary | ICD-10-CM | POA: Insufficient documentation

## 2020-04-14 DIAGNOSIS — K8010 Calculus of gallbladder with chronic cholecystitis without obstruction: Secondary | ICD-10-CM | POA: Diagnosis not present

## 2020-04-14 DIAGNOSIS — Z885 Allergy status to narcotic agent status: Secondary | ICD-10-CM | POA: Diagnosis not present

## 2020-04-14 SURGERY — CHOLECYSTECTOMY, ROBOT-ASSISTED, LAPAROSCOPIC
Anesthesia: General

## 2020-04-14 MED ORDER — BUPIVACAINE LIPOSOME 1.3 % IJ SUSP
INTRAMUSCULAR | Status: AC
  Filled 2020-04-14: qty 20

## 2020-04-14 MED ORDER — ROCURONIUM BROMIDE 100 MG/10ML IV SOLN
INTRAVENOUS | Status: DC | PRN
  Administered 2020-04-14: 20 mg via INTRAVENOUS
  Administered 2020-04-14: 50 mg via INTRAVENOUS

## 2020-04-14 MED ORDER — FENTANYL CITRATE (PF) 100 MCG/2ML IJ SOLN
INTRAMUSCULAR | Status: AC
  Filled 2020-04-14: qty 2

## 2020-04-14 MED ORDER — BUPIVACAINE-EPINEPHRINE (PF) 0.25% -1:200000 IJ SOLN
INTRAMUSCULAR | Status: AC
  Filled 2020-04-14: qty 30

## 2020-04-14 MED ORDER — CHLORHEXIDINE GLUCONATE CLOTH 2 % EX PADS: 6.0000 | MEDICATED_PAD | Freq: Once | CUTANEOUS | Status: DC

## 2020-04-14 MED ORDER — PROPOFOL 10 MG/ML IV BOLUS
INTRAVENOUS | Status: AC
  Filled 2020-04-14: qty 20

## 2020-04-14 MED ORDER — IBUPROFEN 800 MG PO TABS: 800.0000 mg | ORAL_TABLET | Freq: Three times a day (TID) | ORAL | 0 refills | Status: DC | PRN

## 2020-04-14 MED ORDER — OXYCODONE HCL 5 MG PO TABS: 5.0000 mg | ORAL_TABLET | Freq: Four times a day (QID) | ORAL | 0 refills | Status: DC | PRN

## 2020-04-14 MED ORDER — FENTANYL CITRATE (PF) 100 MCG/2ML IJ SOLN
INTRAMUSCULAR | Status: DC | PRN
  Administered 2020-04-14: 50 ug via INTRAVENOUS

## 2020-04-14 MED ORDER — FENTANYL CITRATE (PF) 100 MCG/2ML IJ SOLN
25.0000 ug | INTRAMUSCULAR | Status: DC | PRN
  Administered 2020-04-14 (×3): 25 ug via INTRAVENOUS

## 2020-04-14 MED ORDER — MIDAZOLAM HCL 2 MG/2ML IJ SOLN
INTRAMUSCULAR | Status: DC | PRN
  Administered 2020-04-14: 2 mg via INTRAVENOUS

## 2020-04-14 MED ORDER — BUPIVACAINE-EPINEPHRINE (PF) 0.25% -1:200000 IJ SOLN
INTRAMUSCULAR | Status: DC | PRN
  Administered 2020-04-14: 30 mL

## 2020-04-14 MED ORDER — ONDANSETRON HCL 4 MG/2ML IJ SOLN: 4.0000 mg | Freq: Once | INTRAMUSCULAR | Status: DC | PRN

## 2020-04-14 MED ORDER — ONDANSETRON HCL 4 MG/2ML IJ SOLN
INTRAMUSCULAR | Status: DC | PRN
  Administered 2020-04-14: 4 mg via INTRAVENOUS

## 2020-04-14 MED ORDER — CELECOXIB 200 MG PO CAPS
ORAL_CAPSULE | ORAL | Status: AC
  Administered 2020-04-14: 200 mg via ORAL
  Filled 2020-04-14: qty 1

## 2020-04-14 MED ORDER — FENTANYL CITRATE (PF) 100 MCG/2ML IJ SOLN
INTRAMUSCULAR | Status: AC
  Administered 2020-04-14: 25 ug via INTRAVENOUS
  Filled 2020-04-14: qty 2

## 2020-04-14 MED ORDER — LIDOCAINE HCL (CARDIAC) PF 100 MG/5ML IV SOSY
PREFILLED_SYRINGE | INTRAVENOUS | Status: DC | PRN
  Administered 2020-04-14: 100 mg via INTRAVENOUS

## 2020-04-14 MED ORDER — PROPOFOL 10 MG/ML IV BOLUS
INTRAVENOUS | Status: DC | PRN
  Administered 2020-04-14: 150 mg via INTRAVENOUS

## 2020-04-14 MED ORDER — ACETAMINOPHEN 500 MG PO TABS
ORAL_TABLET | ORAL | Status: AC
  Administered 2020-04-14: 1000 mg via ORAL
  Filled 2020-04-14: qty 2

## 2020-04-14 MED ORDER — CEFAZOLIN SODIUM-DEXTROSE 2-4 GM/100ML-% IV SOLN
INTRAVENOUS | Status: AC
  Filled 2020-04-14: qty 100

## 2020-04-14 MED ORDER — OXYCODONE HCL 5 MG PO TABS
5.0000 mg | ORAL_TABLET | Freq: Four times a day (QID) | ORAL | Status: DC | PRN
  Administered 2020-04-14: 5 mg via ORAL

## 2020-04-14 MED ORDER — FAMOTIDINE 20 MG PO TABS
ORAL_TABLET | ORAL | Status: AC
  Administered 2020-04-14: 20 mg via ORAL
  Filled 2020-04-14: qty 1

## 2020-04-14 MED ORDER — CHLORHEXIDINE GLUCONATE 0.12 % MT SOLN
OROMUCOSAL | Status: AC
  Administered 2020-04-14: 15 mL via OROMUCOSAL
  Filled 2020-04-14: qty 15

## 2020-04-14 MED ORDER — OXYCODONE HCL 5 MG PO TABS
ORAL_TABLET | ORAL | Status: AC
  Filled 2020-04-14: qty 1

## 2020-04-14 MED ORDER — DEXAMETHASONE SODIUM PHOSPHATE 10 MG/ML IJ SOLN
INTRAMUSCULAR | Status: DC | PRN
  Administered 2020-04-14: 8 mg via INTRAVENOUS

## 2020-04-14 MED ORDER — KETOROLAC TROMETHAMINE 30 MG/ML IJ SOLN
INTRAMUSCULAR | Status: DC | PRN
  Administered 2020-04-14: 30 mg via INTRAVENOUS

## 2020-04-14 MED ORDER — MIDAZOLAM HCL 2 MG/2ML IJ SOLN
INTRAMUSCULAR | Status: AC
  Filled 2020-04-14: qty 2

## 2020-04-14 MED ORDER — GABAPENTIN 300 MG PO CAPS
ORAL_CAPSULE | ORAL | Status: AC
  Administered 2020-04-14: 300 mg via ORAL
  Filled 2020-04-14: qty 1

## 2020-04-14 SURGICAL SUPPLY — 48 items
ADH SKN CLS APL DERMABOND .7 (GAUZE/BANDAGES/DRESSINGS) ×1
APL PRP STRL LF DISP 70% ISPRP (MISCELLANEOUS) ×1
BAG SPEC RTRVL LRG 6X4 10 (ENDOMECHANICALS) ×1
CANISTER SUCT 1200ML W/VALVE (MISCELLANEOUS) ×2 IMPLANT
CHLORAPREP W/TINT 26 (MISCELLANEOUS) ×2 IMPLANT
CLIP VESOLOCK MED LG 6/CT (CLIP) ×2 IMPLANT
COVER TIP SHEARS 8 DVNC (MISCELLANEOUS) ×1 IMPLANT
COVER TIP SHEARS 8MM DA VINCI (MISCELLANEOUS) ×1
COVER WAND RF STERILE (DRAPES) ×2 IMPLANT
DECANTER SPIKE VIAL GLASS SM (MISCELLANEOUS) ×2 IMPLANT
DEFOGGER SCOPE WARMER CLEARIFY (MISCELLANEOUS) ×2 IMPLANT
DERMABOND ADVANCED (GAUZE/BANDAGES/DRESSINGS) ×1
DERMABOND ADVANCED .7 DNX12 (GAUZE/BANDAGES/DRESSINGS) ×1 IMPLANT
DRAPE ARM DVNC X/XI (DISPOSABLE) ×4 IMPLANT
DRAPE COLUMN DVNC XI (DISPOSABLE) ×1 IMPLANT
DRAPE DA VINCI XI ARM (DISPOSABLE) ×4
DRAPE DA VINCI XI COLUMN (DISPOSABLE) ×1
ELECT CAUTERY BLADE 6.4 (BLADE) ×2 IMPLANT
GLOVE ORTHO TXT STRL SZ7.5 (GLOVE) ×4 IMPLANT
GOWN STRL REUS W/ TWL LRG LVL3 (GOWN DISPOSABLE) ×4 IMPLANT
GOWN STRL REUS W/TWL LRG LVL3 (GOWN DISPOSABLE) ×8
GRASPER SUT TROCAR 14GX15 (MISCELLANEOUS) ×2 IMPLANT
INFUSOR MANOMETER BAG 3000ML (MISCELLANEOUS) IMPLANT
IRRIGATION STRYKERFLOW (MISCELLANEOUS) IMPLANT
IRRIGATOR STRYKERFLOW (MISCELLANEOUS)
IRRIGATOR SUCT 8 DISP DVNC XI (IRRIGATION / IRRIGATOR) IMPLANT
IRRIGATOR SUCTION 8MM XI DISP (IRRIGATION / IRRIGATOR)
IV NS IRRIG 3000ML ARTHROMATIC (IV SOLUTION) IMPLANT
KIT PINK PAD W/HEAD ARE REST (MISCELLANEOUS) ×2
KIT PINK PAD W/HEAD ARM REST (MISCELLANEOUS) ×1 IMPLANT
KIT TURNOVER KIT A (KITS) ×2 IMPLANT
LABEL OR SOLS (LABEL) ×2 IMPLANT
MANIFOLD NEPTUNE II (INSTRUMENTS) ×2 IMPLANT
NEEDLE HYPO 22GX1.5 SAFETY (NEEDLE) ×2 IMPLANT
NEEDLE INSUFFLATION 14GA 120MM (NEEDLE) IMPLANT
NS IRRIG 500ML POUR BTL (IV SOLUTION) ×2 IMPLANT
PACK LAP CHOLECYSTECTOMY (MISCELLANEOUS) ×2 IMPLANT
PENCIL ELECTRO HAND CTR (MISCELLANEOUS) ×2 IMPLANT
POUCH SPECIMEN RETRIEVAL 10MM (ENDOMECHANICALS) ×2 IMPLANT
SEAL CANN UNIV 5-8 DVNC XI (MISCELLANEOUS) ×3 IMPLANT
SEAL XI 5MM-8MM UNIVERSAL (MISCELLANEOUS) ×3
SET TUBE SMOKE EVAC HIGH FLOW (TUBING) ×2 IMPLANT
SOLUTION ELECTROLUBE (MISCELLANEOUS) ×2 IMPLANT
SUT MNCRL 4-0 (SUTURE) ×2
SUT MNCRL 4-0 27XMFL (SUTURE) ×1
SUT VICRYL 0 AB UR-6 (SUTURE) ×2 IMPLANT
SUTURE MNCRL 4-0 27XMF (SUTURE) ×1 IMPLANT
TROCAR Z-THREAD FIOS 11X100 BL (TROCAR) ×2 IMPLANT

## 2020-04-14 NOTE — Anesthesia Postprocedure Evaluation (Signed)
Anesthesia Post Note  Patient: [name]  Procedure(s) Performed: XI ROBOTIC ASSISTED LAPAROSCOPIC CHOLECYSTECTOMY (N/A )  Patient location during evaluation: PACU Anesthesia Type: General Level of consciousness: awake and alert Pain management: pain level controlled Vital Signs Assessment: post-procedure vital signs reviewed and stable Respiratory status: spontaneous breathing, nonlabored ventilation, respiratory function stable and patient connected to nasal cannula oxygen Cardiovascular status: blood pressure returned to baseline and stable Postop Assessment: no apparent nausea or vomiting Anesthetic complications: no   No complications documented.   Last Vitals:  Vitals:   04/14/20 0944 04/14/20 0957  BP: 108/89 120/73  Pulse: 73 69  Resp: 19 18  Temp: (!) 36.3 C (!) 36.1 C  SpO2: 98% 97%    Last Pain:  Vitals:   04/14/20 1015  TempSrc:   PainSc: 5                  Arita Miss

## 2020-04-14 NOTE — Transfer of Care (Signed)
Immediate Anesthesia Transfer of Care Note  Patient: [name]  Procedure(s) Performed: XI ROBOTIC ASSISTED LAPAROSCOPIC CHOLECYSTECTOMY (N/A )  Patient Location: PACU  Anesthesia Type:General  Level of Consciousness: awake and alert   Airway & Oxygen Therapy: Patient connected to face mask oxygen  Post-op Assessment: Report given to RN and Post -op Vital signs reviewed and stable  Post vital signs: Reviewed and stable  Last Vitals:  Vitals Value Taken Time  BP 125/80 04/14/20 0900  Temp    Pulse 86 04/14/20 0904  Resp 18 04/14/20 0904  SpO2 97 % 04/14/20 0904  Vitals shown include unvalidated device data.  Last Pain:  Vitals:   04/14/20 0621  TempSrc: Oral  PainSc: 6          Complications: No complications documented.

## 2020-04-14 NOTE — Interval H&P Note (Signed)
History and Physical Interval Note:  04/14/2020 7:30 AM  [name]  has presented today for surgery, with the diagnosis of Chronic calculous cholecystitis.  The various methods of treatment have been discussed with the patient and family. After consideration of risks, benefits and other options for treatment, the patient has consented to  Procedure(s): XI ROBOTIC [place] (N/A) as a surgical intervention.  The patient's history has been reviewed, patient examined, no change in status, stable for surgery.  I have reviewed the patient's chart and labs.  Questions were answered to the patient's satisfaction.     Ronny Bacon

## 2020-04-14 NOTE — Anesthesia Procedure Notes (Signed)
Procedure Name: Intubation Date/Time: 04/14/2020 7:50 AM Performed by: Allean Found, CRNA Pre-anesthesia Checklist: Patient identified, Patient being monitored, Timeout performed, Emergency Drugs available and Suction available Patient Re-evaluated:Patient Re-evaluated prior to induction Oxygen Delivery Method: Circle system utilized Preoxygenation: Pre-oxygenation with 100% oxygen Induction Type: IV induction Ventilation: Mask ventilation without difficulty Laryngoscope Size: 3 and McGraph Grade View: Grade II Tube type: Oral Tube size: 7.0 mm Number of attempts: 1 Airway Equipment and Method: Stylet Placement Confirmation: ETT inserted through vocal cords under direct vision,  positive ETCO2 and breath sounds checked- equal and bilateral Secured at: 21 cm Tube secured with: Tape Dental Injury: Teeth and Oropharynx as per pre-operative assessment  Difficulty Due To: Difficult Airway- due to anterior larynx

## 2020-04-14 NOTE — H&P (Signed)
Patient ID: [name], female   DOB: 10-12-1970, 49 y.o.   MRN: [id number]  Chief Complaint: Epigastric pain  History of Present Illness [name] is a 48 y.o. female with epigastric pain which radiated to the left and right costovertebral angle.  Seem to be precipitated during the weekend of eating associated with a birthday celebration.  She had an ED visit and a work-up which revealed her gallstones.  Since then she has limited her oral intake to small meals and snacking and has avoided recurring epigastric pain or gallbladder attacks.  She denies any history hepatitis or jaundice.  She presents today without pain and feeling well.  Past Medical History     Past Medical History:  Diagnosis Date  . Allergy   . Anxiety   . Arthritis   . Chronic bilateral low back pain 07/22/2016   Started in her 20's; managed by pain clinic  . Depression   . Kidney cysts 12/21/2017  . Rheumatoid arthritis [facility])            Past Surgical History:  Procedure Laterality Date  . ABDOMINAL HYSTERECTOMY    . BACK SURGERY     two back sx one in [place] and one at [place]  . BREAST CYST ASPIRATION    . CESAREAN SECTION    . SPINE SURGERY    . SPINE SURGERY  09/09/2015  . TONSILLECTOMY          Allergies  Allergen Reactions  . Codeine Nausea And Vomiting          Current Outpatient Medications  Medication Sig Dispense Refill  . methadone (DOLOPHINE) 10 MG tablet Take 10 mg by mouth 2 (two) times daily.     No current facility-administered medications for this visit.    Family History      Family History  Problem Relation Age of Onset  . Hypertension Mother   . Diabetes Mother   . Gallbladder disease Mother   . Hypertension Father   . Hyperlipidemia Father   . Arthritis Father   . Lung disease Father   . Heart disease Father   . Lymphoma Father   . Cancer Maternal Aunt        breast  . Breast cancer Maternal Aunt 34  . Depression  Sister   . Heart disease Maternal Grandfather   . Depression Sister   . Stroke Neg Hx       Social History Social History        Tobacco Use  . Smoking status: Former Smoker    Packs/day: 1.00    Years: 25.00    Pack years: 25.00    Types: Cigarettes    Start date: 02/09/2020  . Smokeless tobacco: Never Used  . Tobacco comment: recently cut back to 3-4 cig a day, smoking since 20-30's  Vaping Use  . Vaping Use: Every day  Substance Use Topics  . Alcohol use: No  . Drug use: No        Review of Systems  Constitutional: Negative.   HENT: Negative.   Eyes: Negative.   Respiratory: Negative.   Cardiovascular: Negative.   Gastrointestinal: Positive for abdominal pain.  Genitourinary: Negative.   Skin: Negative.   Neurological: Negative.   Psychiatric/Behavioral: Negative.       Physical Exam Blood pressure 128/90, pulse 70, temperature 98.1 F (36.7 C), temperature source Oral, resp. rate 16, height 5\' 7"  (1.702 m), weight 77.1 kg, SpO2 98 %. Last Weight  Most  recent update: 04/14/2020  6:22 AM   Weight  77.1 kg (169 lb 15.6 oz)            CONSTITUTIONAL: Well developed, and nourished, appropriately responsive and aware without distress.   EYES: Sclera non-icteric.   EARS, NOSE, MOUTH AND THROAT: Mask worn.     Hearing is intact to voice.  NECK: Trachea is midline, and there is no jugular venous distension.  LYMPH NODES:  Lymph nodes in the neck are not enlarged. RESPIRATORY:  Lungs are clear, and breath sounds are equal bilaterally. Normal respiratory effort without pathologic use of accessory muscles. CARDIOVASCULAR: Heart is regular in rate and rhythm. GI: The abdomen is soft, nontender, and nondistended. There were no palpable masses. I did not appreciate hepatosplenomegaly. There were normal bowel sounds. MUSCULOSKELETAL:  Symmetrical muscle tone appreciated in all four extremities.    SKIN: Skin turgor is normal. No pathologic skin  lesions appreciated.  NEUROLOGIC:  Motor and sensation appear grossly normal.  Cranial nerves are grossly without defect. PSYCH:  Alert and oriented to person, place and time. Affect is appropriate for situation.  Data Reviewed I have personally reviewed what is currently available of the patient's imaging, recent labs and medical records.   Labs:  CBC Latest Ref Rng & Units 04/12/2020 02/10/2020 09/02/2019  WBC 4.0 - 10.5 K/uL 7.6 7.5 8.9  Hemoglobin 12.0 - 15.0 g/dL 14.1 14.7 14.6  Hematocrit 36 - 46 % 43.5 44.1 44.2  Platelets 150 - 400 K/uL 234 230 227   CMP Latest Ref Rng & Units 04/12/2020 02/10/2020 09/02/2019  Glucose 70 - 99 mg/dL 121(H) 160(H) 119(H)  BUN 6 - 20 mg/dL 20 23 19   Creatinine 0.44 - 1.00 mg/dL 0.85 0.85 0.93  Sodium 135 - 145 mmol/L 138 139 142  Potassium 3.5 - 5.1 mmol/L 4.0 3.9 4.1  Chloride 98 - 111 mmol/L 101 102 105  CO2 22 - 32 mmol/L 27 27 32  Calcium 8.9 - 10.3 mg/dL 9.5 9.6 9.7  Total Protein 6.5 - 8.1 g/dL 6.8 6.5 6.2  Total Bilirubin 0.3 - 1.2 mg/dL 0.5 0.5 0.3  Alkaline Phos 38 - 126 U/L 47 - -  AST 15 - 41 U/L 18 16 16   ALT 0 - 44 U/L 18 18 16       Imaging: Radiology review:  CLINICAL DATA: 50 year old female with intermittent upper abdominal pain for 3 weeks.  EXAM: ULTRASOUND ABDOMEN LIMITED RIGHT UPPER QUADRANT  COMPARISON: Renal ultrasound 01/21/2018 CT Abdomen and Pelvis 07/08/2005.  FINDINGS: Gallbladder:  Numerous shadowing echogenic gallstones throughout the gallbladder (image 2), individually estimated up to 15 mm diameter. Possible superimposed sludge. Gallbladder wall thickness is at the upper limits of normal, 2-3 mm. No sonographic Percell Miller sign was elicited. No pericholecystic fluid.  Common bile duct:  Diameter: Ranges from 6 mm proximally (within normal limits, image 18) up to 10-11 mm distally (dilated, image 20). No filling defect identified within the visible duct.  Liver:  Background liver  echogenicity is within normal limits. A simple appearing oval cyst of the left lobe is identified measuring 2.4 cm (images 33 and 35). No other discrete liver lesion. No intrahepatic biliary ductal dilatation is evident. Portal vein is patent on color Doppler imaging with normal direction of blood flow towards the liver.  Other: Negative visible right kidney.  IMPRESSION: 1. Positive for numerous Gallstones, and dilated distal CBD. Consider Choledocholithiasis. There is no strong evidence of acute cholecystitis at this time.  2. Negative ultrasound appearance of  the liver; small benign left hepatic cyst.  These results will be called to the ordering clinician or representative by the Radiologist Assistant, and communication documented in the PACS or [employer].   Electronically Signed By: Genevie Ann M.D. On: 03/01/2020 15:13  CLINICAL DATA:  Abdominal pain when eating certain foods for 2 months. Cholelithiasis.  EXAM: MRI ABDOMEN WITHOUT AND WITH CONTRAST (INCLUDING MRCP)  TECHNIQUE: Multiplanar multisequence MR imaging of the abdomen was performed both before and after the administration of intravenous contrast. Heavily T2-weighted images of the biliary and pancreatic ducts were obtained, and three-dimensional MRCP images were rendered by post processing.  CONTRAST:  14mL GADAVIST GADOBUTROL 1 MMOL/ML IV SOLN  COMPARISON:  Abdominal ultrasound 03/01/2020 and CT abdomen 07/08/2005  FINDINGS: Despite efforts by the technologist and patient, motion artifact is present on today's exam and could not be eliminated. This reduces exam sensitivity and specificity.  Lower chest: Unremarkable  Hepatobiliary: Small cysts of the left hepatic lobe. Multiple gallstones measuring up to about 1.3 cm in diameter. Prominent common hepatic duct and common bile duct with the CBD measuring up to 1.0 cm in diameter, with distal conical tapering without  directly visualized choledocholithiasis. No abnormal enhancement along the walls of extrahepatic biliary tree.  Pancreas: Unremarkable. No appreciable pancreatic head or ampullary lesion.  Spleen:  Unremarkable  Adrenals/Urinary Tract: 0.9 cm exophytic Bosniak category 1 cyst of the right kidney upper pole. Adrenal glands unremarkable.  Stomach/Bowel: Colonic diverticulosis.  Vascular/Lymphatic:  Unremarkable  Other:  No supplemental non-categorized findings.  Musculoskeletal: Mild levoconvex lumbar scoliosis with rib mild rotary component. Posterolateral rod and pedicle screw fixation at L4-5.  IMPRESSION: 1. Cholelithiasis. 2. Moderately dilated common hepatic duct and common bile duct with distal conical tapering of the CBD without directly visualized choledocholithiasis. No ampullary or pancreatic head mass. Cause for extrahepatic biliary dilatation uncertain. 3. Colonic diverticulosis. 4. Small Bosniak category 1 cyst of the right kidney upper pole.   Electronically Signed   By: Van Clines M.D.   On: 03/30/2020 13:39  Assessment    Chronic calculus cholecystitis, consideration of choledocholithiasis secondary to enlarged distal common bile duct noted on ultrasound.      Patient Active Problem List   Diagnosis Date Noted  . Knee effusion, right 03/18/2018  . Cyst of right kidney 01/08/2018  . Tobacco use 08/25/2017  . Preventative health care 08/25/2017  . Trigger point with back pain (Left) 07/21/2017  . Acute postoperative pain 05/06/2017  . Depression 01/30/2017  . Neurogenic pain 01/14/2017  . Lumbar facet syndrome (Bilateral) 12/18/2016  . Lumbar spondylosis 12/18/2016  . Chronic musculoskeletal pain 12/17/2016  . Chronic pain syndrome 12/05/2016  . Long term (current) use of opiate analgesic 12/05/2016  . Long term prescription opiate use 12/05/2016  . Opiate use (85.5 MME/Day) 12/05/2016  . Chronic hip pain (Secondary source of  pain) (Left) 12/05/2016  . Failed back surgical syndrome 12/05/2016  . Discogenic low back pain 12/05/2016  . Lumbar discogenic pain syndrome 12/05/2016  . Chronic sacroiliac joint pain (Left) 12/05/2016  . Elevated rheumatoid factor 10/22/2016  . Chronic, continuous use of opioids 10/03/2016  . Polyarthralgia 10/02/2016  . Chronic low back pain (Primary Source of Pain) (midline) 07/22/2016  . Breast cancer screening 04/03/2015  . Bladder irritation 04/03/2015  . Lumbosacral spondylosis without myelopathy 03/12/2015  . Lumbar central spinal stenosis (L4-5) 03/12/2015    Class: History of  . Adjustment disorder with depressed mood 03/10/2015  . Stressful life events affecting family and  household 03/10/2015  . Vitamin D deficiency 03/10/2015    Plan  Robotic cholecystectomy.   This was discussed thoroughly.  Optimal plan is for robotic cholecystectomy.  Risks and benefits have been discussed with the patient which include but are not limited to anesthesia, bleeding, infection, biliary ductal injury or stenosis, other associated unanticipated injuries affiliated with laparoscopic surgery.  I believe there is the desire to proceed, interpreter utilized as needed.  Questions elicited and answered to satisfaction.  No guarantees ever expressed or implied.      Ronny Bacon M.D., FACS 04/14/2020, 7:27 AM

## 2020-04-14 NOTE — Discharge Instructions (Signed)

## 2020-04-14 NOTE — Op Note (Signed)
Robotic cholecystectomy  Pre-operative Diagnosis: Chronic calculus cholecystitis  Post-operative Diagnosis:  Same.  Procedure: Robotic assisted laparoscopic cholecystectomy.  Surgeon: Ronny Bacon, M.D., FACS  Anesthesia: General. with endotracheal tube  Findings: as expected, large stones, normal cystic duct caliber, mild adhesions  Estimated Blood Loss: 15 mL         Drains: None         Specimens: Gallbladder           Complications: none  Procedure Details  The patient was seen again in the Holding Room.  1.25-2.5 mg dose of ICG was administered intravenously.  The benefits, complications, treatment options, risks and expected outcomes were discussed with the patient. The likelihood of improving the patient's symptoms with return to their baseline status is good.  The patient and/or family concurred with the proposed plan, giving informed consent, again alternatives reviewed.  The patient was taken to Operating Room, identified, and the procedure verified as robotic assisted laparoscopic cholecystectomy.  Prior to the induction of general anesthesia, antibiotic prophylaxis was administered. VTE prophylaxis was in place. General endotracheal anesthesia was then administered and tolerated well. The patient was positioned in the supine position.  After the induction, the abdomen was prepped with Chloraprep and draped in the sterile fashion.  A Time Out was held and the above information confirmed.  Right infra-umbilical local infiltration with quarter percent Marcaine with epinephrine is utilized.  Made a 12 mm incision on the left periumbilical site, I advanced an optical 9mm port under direct visualization into the peritoneal cavity.  Once the peritoneum was penetrated, insufflation was initiated.  The trocar was then advanced into the abdominal cavity under direct visualization. Pneumoperitoneum was then continued with CO2 at 14 mmHg or less and tolerated well without any adverse  changes in the patient's vital signs.  Two 8.5-mm ports were placed in the left lower quadrant and laterally, and one to the right lower quadrant, all under direct vision. All skin incisions  were infiltrated with a local anesthetic agent before making the incision and placing the trocars.  The patient was positioned  in reverse Trendelenburg, tilted the patient's left side down.  Da Vinci XI robot was then positioned on to the patient's left side, and docked.  The gallbladder was identified, the fundus grasped via the arm 4 Prograsp and retracted cephalad. Adhesions were lysed with scissors and cautery.  The infundibulum was identified grasped and retracted laterally, exposing the peritoneum overlying the triangle of Calot. This was then opened and dissected using cautery & scissors. An extended critical view of the cystic duct and cystic artery was obtained, aided by the ICG via FireFly which enabled ready visualization of the ductal anatomy.    The cystic duct was clearly identified and dissected to isolation.   Artery well isolated and controled with bipolar cautery, and the cystic duct was triple clipped and divided with scissors, leaving two on the remaining stump.  The specimen side of the artery is sealed with bipolar and divided with monopolar scissors.   The gallbladder was taken from the gallbladder fossa in a retrograde fashion with the electrocautery. The gallbladder was removed and placed in an Endocatch bag.  The liver bed is inspected. Hemostasis was confirmed.  The robot was undocked and moved away from the operative field. No irrigation was utilized and trace of incisional blood was aspirated clear.  The gallbladder and Endocatch sac were then removed through the infraumbilical port site.   Inspection of the right upper quadrant  was performed. No bleeding, bile duct injury or leak, or bowel injury was noted. The infra-umbilical port site fascia was closed with interrumpted 0 Vicryl  sutures using PMI/cone under direct visualization. Pneumoperitoneum was released and ports removed.  4-0 subcuticular Monocryl was used to close the skin. Dermabond was  applied.  The patient was then extubated and brought to the recovery room in stable condition. Sponge, lap, and needle counts were correct at closure and at the conclusion of the case.               Ronny Bacon, M.D., [facility] 04/14/2020 8:55 AM

## 2020-04-14 NOTE — Anesthesia Preprocedure Evaluation (Signed)
Anesthesia Evaluation  Patient identified by MRN, date of birth, ID band Patient awake    Reviewed: Allergy & Precautions, H&P , NPO status , Patient's Chart, lab work & pertinent test results, reviewed documented beta blocker date and time   History of Anesthesia Complications Negative for: history of anesthetic complications  Airway Mallampati: IV  TM Distance: >3 FB Neck ROM: full    Dental  (+) Dental Advidsory Given, Teeth Intact   Pulmonary neg pulmonary ROS, former smoker,    Pulmonary exam normal breath sounds clear to auscultation       Cardiovascular Exercise Tolerance: Good negative cardio ROS Normal cardiovascular exam Rhythm:regular Rate:Normal     Neuro/Psych neg Seizures PSYCHIATRIC DISORDERS Anxiety Depression  Neuromuscular disease    GI/Hepatic negative GI ROS, Neg liver ROS,   Endo/Other  negative endocrine ROS  Renal/GU Renal disease (kidney cysts)  negative genitourinary   Musculoskeletal   Abdominal   Peds  Hematology negative hematology ROS (+)   Anesthesia Other Findings Past Medical History: No date: Allergy No date: Anxiety No date: Arthritis 07/22/2016: Chronic bilateral low back pain     Comment:  Started in her 20's; managed by pain clinic No date: Depression 12/21/2017: Kidney cysts No date: Rheumatoid arthritis ([place])   Reproductive/Obstetrics negative OB ROS                             Anesthesia Physical Anesthesia Plan  ASA: II  Anesthesia Plan: General   Post-op Pain Management:    Induction: Intravenous  PONV Risk Score and Plan: 3 and Ondansetron, Dexamethasone, Midazolam and Treatment may vary due to age or medical condition  Airway Management Planned: Oral ETT  Additional Equipment:   Intra-op Plan:   Post-operative Plan: Extubation in OR  Informed Consent: I have reviewed the patients History and Physical, chart, labs and  discussed the procedure including the risks, benefits and alternatives for the proposed anesthesia with the patient or authorized representative who has indicated his/her understanding and acceptance.     Dental Advisory Given  Plan Discussed with: Anesthesiologist, CRNA and Surgeon  Anesthesia Plan Comments:         Anesthesia Quick Evaluation

## 2020-04-17 LAB — SURGICAL PATHOLOGY

## 2020-04-24 ENCOUNTER — Telehealth: Payer: Self-pay | Admitting: Surgery

## 2020-04-24 NOTE — Telephone Encounter (Signed)
Patient is calling and is asking if one of the nurses could give her a call regarding a rash, just wants to know if its normal to have one after surgery. Please call patient and advise.

## 2020-04-24 NOTE — Telephone Encounter (Signed)
Left message for patient to give our office a call back.

## 2020-04-25 NOTE — Telephone Encounter (Signed)
Patient reports itching around incisions where her incision sites are with a little rash. It seems to be where the tape was at. She reports it is not getting any worse. Patient instructed to apply cool compress to area and my apply Benadryl or Cortizone cream to rash. She may also take oral Benadryl as directed if needed. She is aware to call back if the rash and itching worsen or if she has any fever, chills, drainage, or increasing redness.

## 2020-04-27 ENCOUNTER — Encounter: Payer: 59 | Admitting: Surgery

## 2020-05-16 ENCOUNTER — Encounter: Payer: 59 | Admitting: Surgery

## 2020-05-23 ENCOUNTER — Encounter: Payer: 59 | Admitting: Surgery

## 2020-06-22 ENCOUNTER — Other Ambulatory Visit: Payer: Self-pay | Admitting: Internal Medicine

## 2020-06-22 DIAGNOSIS — Z1231 Encounter for screening mammogram for malignant neoplasm of breast: Secondary | ICD-10-CM

## 2020-06-26 ENCOUNTER — Other Ambulatory Visit: Payer: Self-pay

## 2020-06-26 ENCOUNTER — Telehealth (INDEPENDENT_AMBULATORY_CARE_PROVIDER_SITE_OTHER): Payer: Self-pay | Admitting: Gastroenterology

## 2020-06-26 DIAGNOSIS — Z1211 Encounter for screening for malignant neoplasm of colon: Secondary | ICD-10-CM

## 2020-06-26 MED ORDER — PEG 3350-KCL-NA BICARB-NACL 420 G PO SOLR: 4000.0000 mL | Freq: Once | ORAL | 0 refills | Status: AC

## 2020-06-26 NOTE — Progress Notes (Signed)
Gastroenterology Pre-Procedure Review  Request Date: 08/07/20 Requesting Physician: Dr. Marius Ditch  PATIENT REVIEW QUESTIONS: The patient responded to the following health history questions as indicated:    1. Are you having any GI issues? no 2. Do you have a personal history of Polyps?no 3. Do you have a family history of Colon Cancer or Polyps? no 4. Diabetes Mellitus? no 5. Joint replacements in the past 12 months? 6. Major health problems in the past 3 months?yes gallbladder surgery 04/14/20 7. Any artificial heart valves, MVP, or defibrillator?no    MEDICATIONS & ALLERGIES:    Patient reports the following regarding taking any anticoagulation/antiplatelet therapy:   Plavix, Coumadin, Eliquis, Xarelto, Lovenox, Pradaxa, Brilinta, or Effient? no Aspirin? no  Patient confirms/reports the following medications:  Current Outpatient Medications  Medication Sig Dispense Refill  . ibuprofen (ADVIL) 800 MG tablet Take 1 tablet (800 mg total) by mouth every 8 (eight) hours as needed. 30 tablet 0  . methadone (DOLOPHINE) 10 MG tablet Take 10 mg by mouth 2 (two) times daily.    Marland Kitchen oxyCODONE (OXY IR/ROXICODONE) 5 MG immediate release tablet Take 1 tablet (5 mg total) by mouth every 6 (six) hours as needed for severe pain. 15 tablet 0  . Oxycodone HCl 10 MG TABS Take 10 mg by mouth 2 (two) times daily as needed.    . TURMERIC PO Take 1 capsule by mouth at bedtime.     No current facility-administered medications for this visit.    Patient confirms/reports the following allergies:  Allergies  Allergen Reactions  . Codeine Nausea And Vomiting    No orders of the defined types were placed in this encounter.   AUTHORIZATION INFORMATION Primary Insurance: 1D#: Group #:  Secondary Insurance: 1D#: Group #:  SCHEDULE INFORMATION: Date:08/07/20   Call Endoscopy for arrival time [facility]

## 2020-06-27 NOTE — Progress Notes (Signed)
Name: [name]   MRN: [id number]    DOB: Oct 20, 1970   Date:06/28/2020       Progress Note  Subjective  Chief Complaint  Chief Complaint  Patient presents with  . Headache  . Nasal Congestion    I connected with  SHOLANDA BAGNASCO on 06/28/20 at  2:40 PM EST by telephone and verified that I am speaking with the correct person using two identifiers.  I discussed the limitations, risks, security and privacy concerns of performing an evaluation and management service by telephone and the availability of in person appointments. The patient expressed understanding and agreed to proceed. Staff also discussed with the patient that there may be a patient responsible charge related to this service. Patient Location: Home Provider Location: [facility] Additional Individuals present: none  HPI Patient is a 50 year old female Presents today with the above complaint  COVID immunization status - had one vaccine dose, 6 months  Symptoms began a week ago Exposure history- I did talk to her husband earlier today, who is having similar symptoms and recommended him getting tested for COVID today. Around Lidia Collum was sick and got better. Cousins with at [facility] now have Covid  No marked cough, + production when does, brown phlegm No marked SOB No fever, not feeling feverish No sore throat.  + sinus congestion, + PND - mucus clear No loss of smell, loss of taste No N/V + HA + muscle aches. + fatigued + marked loose stools/diarrhea 2-3 X per day (had GB out recently and unclear if related) No CP, chest congestion/pressure feeling, no passing out episodes   Comorbid conditions reviewed No asthma No h/o DM, heart disease, CKD  Tob - 1 PPD for many years and quit several months ago, congratulated and strongly encouraged that to continue  She noted she really feels like she needs an antibiotic at this point given her chest symptoms.    Patient Active Problem List   Diagnosis Date Noted  . CCC  (chronic calculous cholecystitis) 03/14/2020  . Knee effusion, right 03/18/2018  . Cyst of right kidney 01/08/2018  . Tobacco use 08/25/2017  . Preventative health care 08/25/2017  . Trigger point with back pain (Left) 07/21/2017  . Acute postoperative pain 05/06/2017  . Depression 01/30/2017  . Neurogenic pain 01/14/2017  . Lumbar facet syndrome (Bilateral) 12/18/2016  . Lumbar spondylosis 12/18/2016  . Chronic musculoskeletal pain 12/17/2016  . Chronic pain syndrome 12/05/2016  . Long term (current) use of opiate analgesic 12/05/2016  . Long term prescription opiate use 12/05/2016  . Opiate use (85.5 MME/Day) 12/05/2016  . Chronic hip pain (Secondary source of pain) (Left) 12/05/2016  . Failed back surgical syndrome 12/05/2016  . Discogenic low back pain 12/05/2016  . Lumbar discogenic pain syndrome 12/05/2016  . Chronic sacroiliac joint pain (Left) 12/05/2016  . Elevated rheumatoid factor 10/22/2016  . Chronic, continuous use of opioids 10/03/2016  . Polyarthralgia 10/02/2016  . Chronic low back pain (Primary Source of Pain) (midline) 07/22/2016  . Breast cancer screening 04/03/2015  . Bladder irritation 04/03/2015  . Lumbosacral spondylosis without myelopathy 03/12/2015  . Lumbar central spinal stenosis (L4-5) 03/12/2015    Class: History of  . Adjustment disorder with depressed mood 03/10/2015  . Stressful life events affecting family and household 03/10/2015  . Vitamin D deficiency 03/10/2015    Past Surgical History:  Procedure Laterality Date  . ABDOMINAL HYSTERECTOMY    . BACK SURGERY     two back sx one  in [place] and one at [place]  . BREAST CYST ASPIRATION    . CESAREAN SECTION    . SPINE SURGERY    . SPINE SURGERY  09/09/2015  . TONSILLECTOMY      Family History  Problem Relation Age of Onset  . Hypertension Mother   . Diabetes Mother   . Gallbladder disease Mother   . Hypertension Father   . Hyperlipidemia Father   . Arthritis Father   . Lung  disease Father   . Heart disease Father   . Lymphoma Father   . Cancer Maternal Aunt        breast  . Breast cancer Maternal Aunt 34  . Depression Sister   . Heart disease Maternal Grandfather   . Depression Sister   . Stroke Neg Hx     Social History   Tobacco Use  . Smoking status: Former Smoker    Packs/day: 1.00    Years: 25.00    Pack years: 25.00    Types: Cigarettes    Start date: 02/09/2020  . Smokeless tobacco: Never Used  . Tobacco comment: recently cut back to 3-4 cig a day, smoking since 20-30's  Substance Use Topics  . Alcohol use: No     Current Outpatient Medications:  .  ibuprofen (ADVIL) 800 MG tablet, Take 1 tablet (800 mg total) by mouth every 8 (eight) hours as needed., Disp: 30 tablet, Rfl: 0 .  methadone (DOLOPHINE) 10 MG tablet, Take 10 mg by mouth 2 (two) times daily., Disp: , Rfl:  .  oxyCODONE (OXY IR/ROXICODONE) 5 MG immediate release tablet, Take 1 tablet (5 mg total) by mouth every 6 (six) hours as needed for severe pain., Disp: 15 tablet, Rfl: 0 .  Oxycodone HCl 10 MG TABS, Take 10 mg by mouth 2 (two) times daily as needed., Disp: , Rfl:  .  TURMERIC PO, Take 1 capsule by mouth at bedtime., Disp: , Rfl:   Allergies  Allergen Reactions  . Codeine Nausea And Vomiting    With staff assistance, above reviewed with the patient today.  ROS: As per HPI, otherwise no specific complaints on a limited and focused system review   Objective  Virtual encounter, vitals not obtained.  There is no height or weight on file to calculate BMI.  Physical Exam   Appears in NAD via conversation, no major coughing episodes during our phone conversation Pulmonary/Chest: No obvious respiratory distress. Speaking in complete sentences Neurological: Pt is alert, Speech is normal Psychiatric: Patient has a normal mood and affect, behavior is normal. Judgment and thought content normal.   No results found for this or any previous visit (from the past 72  hour(s)).  PHQ2/9: Depression screen [facility] 2/9 06/28/2020 02/10/2020 09/02/2019 06/08/2019 03/24/2018  Decreased Interest 0 0 0 0 0  Down, Depressed, Hopeless 0 0 0 0 0  PHQ - 2 Score 0 0 0 0 0  Altered sleeping 0 - 0 0 -  Tired, decreased energy 0 - 0 0 -  Change in appetite 0 - 0 0 -  Feeling bad or failure about yourself  0 - 0 0 -  Trouble concentrating 0 - 0 0 -  Moving slowly or fidgety/restless 0 - 0 0 -  Suicidal thoughts 0 - 0 0 -  PHQ-9 Score 0 - 0 0 -  Difficult doing work/chores - - Not difficult at all - -  Some recent data might be hidden   PHQ-2/9 Result reviewed  Fall Risk: Fall  Risk  06/28/2020 03/14/2020 03/14/2020 02/10/2020 09/02/2019  Falls in the past year? 0 0 0 0 0  Number falls in past yr: 0 0 0 0 0  Injury with Fall? 0 0 0 0 0  Comment - - - - -  Risk for fall due to : - - - - -  Risk for fall due to: Comment - - - - -  Follow up - - - Falls evaluation completed -     Assessment & Plan  1. Upper respiratory tract infection, unspecified type/bronchitis Discussed concerns for COVID, especially noting she has not completed the vaccine, and also noting some exposure over Christmas and her husband currently ill as well. She noted she was ill over Christmas, was better for couple weeks before her symptoms returned.  Now mostly chest symptoms of concern. She did agree to get a COVID test and will arrange to get it through [facility].  As soon as today. We will add an azithromycin product as directed, also recommended a Mucinex or Robitussin type product as needed as an expectorant to help, can use Tylenol products as needed as well. Rest and hydration important. Staying isolated until symptoms definitely improved important  Emphasized following up if symptoms not improving or more problematic, and if symptoms acutely worsen with increased shortness of breath, chest pains, higher fevers, she needs to be seen more emergently in an ER setting and she was understanding  of that.  - Novel Coronavirus, NAA ([facility]) - azithromycin (ZITHROMAX) 250 MG tablet; Take 2 tablets on day 1, then 1 tablet daily for the next 4 days  Dispense: 6 tablet; Refill: 0  Await the COVID result presently, and I did note to her I will not be available after tomorrow at this practice, with a colleague following up with results as it would likely take a couple days or more for these results to return.  She was aware of that.  I discussed the assessment and treatment plan with the patient. The patient was provided an opportunity to ask questions and all were answered. The patient agreed with the plan and demonstrated an understanding of the instructions.  Red flags and when to present for emergency care or RTC including fevers, chest pain, shortness of breath, new/worsening/un-resolving symptoms reviewed with patient at time of visit.   The patient was advised to call back or seek an in-person evaluation if the symptoms worsen or if the condition fails to improve as anticipated.  I provided 20 minutes of non-face-to-face time during this encounter that included discussing at length patient's sx/history, pertinent pmhx, medications, treatment and follow up plan. This time also included the necessary documentation, orders, and chart review.  Towanda Malkin, MD

## 2020-06-28 ENCOUNTER — Telehealth (INDEPENDENT_AMBULATORY_CARE_PROVIDER_SITE_OTHER): Payer: 59 | Admitting: Internal Medicine

## 2020-06-28 ENCOUNTER — Encounter: Payer: Self-pay | Admitting: Internal Medicine

## 2020-06-28 DIAGNOSIS — J069 Acute upper respiratory infection, unspecified: Secondary | ICD-10-CM | POA: Diagnosis not present

## 2020-06-28 DIAGNOSIS — J40 Bronchitis, not specified as acute or chronic: Secondary | ICD-10-CM | POA: Diagnosis not present

## 2020-06-28 MED ORDER — AZITHROMYCIN 250 MG PO TABS: ORAL_TABLET | ORAL | 0 refills | Status: DC

## 2020-07-01 LAB — NOVEL CORONAVIRUS, NAA

## 2020-07-01 LAB — SARS-COV-2, NAA 2 DAY TAT

## 2020-07-04 ENCOUNTER — Ambulatory Visit: Payer: Self-pay | Admitting: *Deleted

## 2020-07-04 NOTE — Telephone Encounter (Signed)
Patient calling to review covid test results. Reviewed with patient comment noted and results was indeterminate. Test was unable to reliably determine a result for the specimen obtained and submitted. Patient reports she feels she was positive due to her symptoms and her husband was also positive with similar symptoms. Reviewed isolation precautions and patient reports her symptoms are better. She has seen her PCP. Criteria for self-isolation if you test positive for COVID-19, regardless of vaccination status:  -If you have mild symptoms that are resolving or have resolved, isolate at home for 5 days since symptoms started AND continue to wear a well-fitted mask when around others in the home and in public for 5 additional days after isolation is completed -If you have a fever and/or moderate to severe symptoms, isolate for at least 10 days since the symptoms started AND until you are fever free for at least 24 hours without the use of fever-reducing medications -If you tested positive and did not have symptoms, isolate for at least 5 days after your positive test  Use over-the-counter medications for symptoms.If you develop respiratory issues/distress, seek medical care in the Emergency Department.  If you must leave home or if you have to be around others please wear a mask. Please limit contact around others.  Patient does not want to be retested at this time.

## 2020-07-19 ENCOUNTER — Other Ambulatory Visit: Payer: Self-pay

## 2020-07-19 ENCOUNTER — Ambulatory Visit
Admission: RE | Admit: 2020-07-19 | Discharge: 2020-07-19 | Disposition: A | Discharge status: Home / Self Care | Payer: 59 | Source: Ambulatory Visit | Attending: Internal Medicine | Admitting: Internal Medicine

## 2020-07-19 DIAGNOSIS — Z1231 Encounter for screening mammogram for malignant neoplasm of breast: Secondary | ICD-10-CM | POA: Insufficient documentation

## 2020-08-03 ENCOUNTER — Other Ambulatory Visit: Payer: 59 | Attending: Gastroenterology

## 2020-08-04 ENCOUNTER — Other Ambulatory Visit: Payer: Self-pay

## 2020-08-04 ENCOUNTER — Other Ambulatory Visit: Payer: 59

## 2020-08-04 ENCOUNTER — Telehealth: Payer: Self-pay | Admitting: Gastroenterology

## 2020-08-04 DIAGNOSIS — Z1211 Encounter for screening for malignant neoplasm of colon: Secondary | ICD-10-CM

## 2020-08-04 NOTE — Telephone Encounter (Signed)
Moved patient to 08/21/2020

## 2020-08-04 NOTE — Telephone Encounter (Signed)
Patient sick and needs to r/s her colonoscopy on Monday 08/07/20.

## 2020-08-07 ENCOUNTER — Ambulatory Visit: Admission: RE | Admit: 2020-08-07 | Payer: 59 | Source: Home / Self Care | Admitting: Gastroenterology

## 2020-08-07 ENCOUNTER — Encounter: Admission: RE | Payer: Self-pay | Source: Home / Self Care

## 2020-08-07 SURGERY — COLONOSCOPY WITH PROPOFOL
Anesthesia: General

## 2020-08-17 ENCOUNTER — Other Ambulatory Visit: Payer: 59 | Attending: Gastroenterology

## 2020-08-21 ENCOUNTER — Ambulatory Visit: Admission: RE | Admit: 2020-08-21 | Payer: 59 | Source: Home / Self Care | Admitting: Gastroenterology

## 2020-08-21 ENCOUNTER — Encounter: Admission: RE | Payer: Self-pay | Source: Home / Self Care

## 2020-08-21 SURGERY — COLONOSCOPY WITH PROPOFOL
Anesthesia: General

## 2021-01-03 ENCOUNTER — Encounter: Payer: 59 | Admitting: Family Medicine

## 2021-01-17 ENCOUNTER — Ambulatory Visit (INDEPENDENT_AMBULATORY_CARE_PROVIDER_SITE_OTHER): Payer: 59 | Admitting: Family Medicine

## 2021-01-17 ENCOUNTER — Other Ambulatory Visit (HOSPITAL_COMMUNITY)
Admission: RE | Admit: 2021-01-17 | Discharge: 2021-01-17 | Disposition: A | Discharge status: Home / Self Care | Payer: 59 | Source: Ambulatory Visit | Attending: Family Medicine | Admitting: Family Medicine

## 2021-01-17 ENCOUNTER — Other Ambulatory Visit: Payer: Self-pay

## 2021-01-17 ENCOUNTER — Encounter: Payer: Self-pay | Admitting: Family Medicine

## 2021-01-17 VITALS — BP 124/70 | HR 75 | Temp 98.1°F | Resp 14 | Ht 67.0 in | Wt 175.6 lb

## 2021-01-17 DIAGNOSIS — Z0000 Encounter for general adult medical examination without abnormal findings: Secondary | ICD-10-CM | POA: Diagnosis not present

## 2021-01-17 DIAGNOSIS — Z131 Encounter for screening for diabetes mellitus: Secondary | ICD-10-CM

## 2021-01-17 DIAGNOSIS — M5136 Other intervertebral disc degeneration, lumbar region: Secondary | ICD-10-CM | POA: Insufficient documentation

## 2021-01-17 DIAGNOSIS — Z1211 Encounter for screening for malignant neoplasm of colon: Secondary | ICD-10-CM

## 2021-01-17 DIAGNOSIS — Z124 Encounter for screening for malignant neoplasm of cervix: Secondary | ICD-10-CM

## 2021-01-17 DIAGNOSIS — Z23 Encounter for immunization: Secondary | ICD-10-CM

## 2021-01-17 DIAGNOSIS — Z1159 Encounter for screening for other viral diseases: Secondary | ICD-10-CM | POA: Diagnosis not present

## 2021-01-17 DIAGNOSIS — Z1231 Encounter for screening mammogram for malignant neoplasm of breast: Secondary | ICD-10-CM

## 2021-01-17 NOTE — Patient Instructions (Signed)
It was great to see you!  Our plans for today:  - Call GI to reschedule your colonoscopy. -   We are checking some labs today, we will release these results to your MyChart.  Take care and seek immediate care sooner if you develop any concerns.   Dr. Ky Barban Preventive Care 52-52 Years Old, Female Preventive care refers to lifestyle choices and visits with your health care provider that can promote health and wellness. This includes: A yearly physical exam. This is also called an annual wellness visit. Regular dental and eye exams. Immunizations. Screening for certain conditions. Healthy lifestyle choices, such as: Eating a healthy diet. Getting regular exercise. Not using drugs or products that contain nicotine and tobacco. Limiting alcohol use. What can I expect for my preventive care visit? Physical exam Your health care provider will check your: Height and weight. These may be used to calculate your BMI (body mass index). BMI is a measurement that tells if you are at a healthy weight. Heart rate and blood pressure. Body temperature. Skin for abnormal spots. Counseling Your health care provider may ask you questions about your: Past medical problems. Family's medical history. Alcohol, tobacco, and drug use. Emotional well-being. Home life and relationship well-being. Sexual activity. Diet, exercise, and sleep habits. Work and work Statistician. Access to firearms. Method of birth control. Menstrual cycle. Pregnancy history. What immunizations do I need?  Vaccines are usually given at various ages, according to a schedule. Your health care provider will recommend vaccines for you based on your age, medicalhistory, and lifestyle or other factors, such as travel or where you work. What tests do I need? Blood tests Lipid and cholesterol levels. These may be checked every 5 years, or more often if you are over 59 years old. Hepatitis C test. Hepatitis B  test. Screening Lung cancer screening. You may have this screening every year starting at age 26 if you have a 30-pack-year history of smoking and currently smoke or have quit within the past 15 years. Colorectal cancer screening. All adults should have this screening starting at age 65 and continuing until age 79. Your health care provider may recommend screening at age 77 if you are at increased risk. You will have tests every 1-10 years, depending on your results and the type of screening test. Diabetes screening. This is done by checking your blood sugar (glucose) after you have not eaten for a while (fasting). You may have this done every 1-3 years. Mammogram. This may be done every 1-2 years. Talk with your health care provider about when you should start having regular mammograms. This may depend on whether you have a family history of breast cancer. BRCA-related cancer screening. This may be done if you have a family history of breast, ovarian, tubal, or peritoneal cancers. Pelvic exam and Pap test. This may be done every 3 years starting at age 36. Starting at age 95, this may be done every 5 years if you have a Pap test in combination with an HPV test. Other tests STD (sexually transmitted disease) testing, if you are at risk. Bone density scan. This is done to screen for osteoporosis. You may have this scan if you are at high risk for osteoporosis. Talk with your health care provider about your test results, treatment options,and if necessary, the need for more tests. Follow these instructions at home: Eating and drinking  Eat a diet that includes fresh fruits and vegetables, whole grains, lean protein, and low-fat dairy products. Take  vitamin and mineral supplements as recommended by your health care provider. Do not drink alcohol if: Your health care provider tells you not to drink. You are pregnant, may be pregnant, or are planning to become pregnant. If you drink  alcohol: Limit how much you have to 0-1 drink a day. Be aware of how much alcohol is in your drink. In the [place] one drink equals one 12 oz bottle of beer (355 mL), one 5 oz glass of wine (148 mL), or one 1 oz glass of hard liquor (44 mL).  Lifestyle Take daily care of your teeth and gums. Brush your teeth every morning and night with fluoride toothpaste. Floss one time each day. Stay active. Exercise for at least 30 minutes 5 or more days each week. Do not use any products that contain nicotine or tobacco, such as cigarettes, e-cigarettes, and chewing tobacco. If you need help quitting, ask your health care provider. Do not use drugs. If you are sexually active, practice safe sex. Use a condom or other form of protection to prevent STIs (sexually transmitted infections). If you do not wish to become pregnant, use a form of birth control. If you plan to become pregnant, see your health care provider for a prepregnancy visit. If told by your health care provider, take low-dose aspirin daily starting at age 63. Find healthy ways to cope with stress, such as: Meditation, yoga, or listening to music. Journaling. Talking to a trusted person. Spending time with friends and family. Safety Always wear your seat belt while driving or riding in a vehicle. Do not drive: If you have been drinking alcohol. Do not ride with someone who has been drinking. When you are tired or distracted. While texting. Wear a helmet and other protective equipment during sports activities. If you have firearms in your house, make sure you follow all gun safety procedures. What's next? Visit your health care provider once a year for an annual wellness visit. Ask your health care provider how often you should have your eyes and teeth checked. Stay up to date on all vaccines. This information is not intended to replace advice given to you by your health care provider. Make sure you discuss any questions you have with  your healthcare provider. Document Revised: 02/29/2020 Document Reviewed: 02/05/2018 Elsevier Patient Education  2022 [employer].

## 2021-01-17 NOTE — Progress Notes (Signed)
Name: [name]   MRN: [id number]    DOB: Nov 12, 1970   Date:01/17/2021       Progress Note  Subjective  Chief Complaint  Chief Complaint  Patient presents with   Annual Exam    HPI  Patient presents for annual CPE.  Diet: discussed a balanced diet  Exercise: difficulty doing physical activity   [place] Office Visit from 01/17/2021 in [facility]  AUDIT-C Score 0      Depression: Phq 9 is  positive Depression screen [facility] 2/9 01/17/2021 06/28/2020 02/10/2020 09/02/2019 06/08/2019  Decreased Interest 1 0 0 0 0  Down, Depressed, Hopeless 1 0 0 0 0  PHQ - 2 Score 2 0 0 0 0  Altered sleeping 1 0 - 0 0  Tired, decreased energy 1 0 - 0 0  Change in appetite 1 0 - 0 0  Feeling bad or failure about yourself  1 0 - 0 0  Trouble concentrating 1 0 - 0 0  Moving slowly or fidgety/restless 0 0 - 0 0  Suicidal thoughts 0 0 - 0 0  PHQ-9 Score 7 0 - 0 0  Difficult doing work/chores Somewhat difficult - - Not difficult at all -  Some recent data might be hidden   Hypertension: BP Readings from Last 3 Encounters:  01/17/21 124/70  04/14/20 120/73  03/14/20 109/75   Obesity: Wt Readings from Last 3 Encounters:  01/17/21 175 lb 9.6 oz (79.7 kg)  04/14/20 169 lb 15.6 oz (77.1 kg)  04/10/20 170 lb (77.1 kg)   BMI Readings from Last 3 Encounters:  01/17/21 27.50 kg/m  04/14/20 26.62 kg/m  04/10/20 26.63 kg/m     Vaccines:   Shingrix: 76-64 yo and ask insurance if covered when patient above 23 yo Pneumonia:  educated and discussed with patient. Flu:  educated and discussed with patient.  Hep C Screening: today  STD testing and prevention (HIV/chl/gon/syphilis): today Intimate partner violence:negative Sexual History : married but not sexually active  Menstrual History/LMP/Abnormal Bleeding: s/p hysterectomy  Incontinence Symptoms: no problems   Breast cancer:  - Last Mammogram: 07/2020  - BRCA gene screening: N/A  Osteoporosis: Discussed high  calcium and vitamin D supplementation, weight bearing exercises  Cervical cancer screening: 2019   Skin cancer: Discussed monitoring for atypical lesions  Colorectal cancer: referral placed   Lung cancer:  Low Dose CT Chest recommended if Age 21-80 years, 20 pack-year currently smoking OR have quit w/in 15years. Patient does not qualify.   ECG: 07/22/16  Advanced Care Planning: A voluntary discussion about advance care planning including the explanation and discussion of advance directives.  Discussed health care proxy and Living will, and the patient was able to identify a health care proxy as husband   Lipids: Lab Results  Component Value Date   CHOL 199 09/02/2019   CHOL 193 01/08/2018   CHOL 207 (H) 09/18/2016   Lab Results  Component Value Date   HDL 50 09/02/2019   HDL 48 (L) 01/08/2018   HDL 78 09/18/2016   Lab Results  Component Value Date   LDLCALC 118 (H) 09/02/2019   LDLCALC 115 (H) 01/08/2018   LDLCALC 117 (H) 09/18/2016   Lab Results  Component Value Date   TRIG 192 (H) 09/02/2019   TRIG 187 (H) 01/08/2018   TRIG 62 09/18/2016   Lab Results  Component Value Date   CHOLHDL 4.0 09/02/2019   CHOLHDL 4.0 01/08/2018   CHOLHDL 2.7 09/18/2016   No  results found for: LDLDIRECT  Glucose: Glucose  Date Value Ref Range Status  04/16/2013 139 (H) 65 - 99 mg/dL Final  12/26/2011 166 (H) 65 - 99 mg/dL Final   Glucose, Bld  Date Value Ref Range Status  04/12/2020 121 (H) 70 - 99 mg/dL Final    Comment:    Glucose reference range applies only to samples taken after fasting for at least 8 hours.  02/10/2020 160 (H) 65 - 99 mg/dL Final    Comment:    .            Fasting reference interval . For someone without known diabetes, a glucose value >125 mg/dL indicates that they may have diabetes and this should be confirmed with a follow-up test. .   09/02/2019 119 (H) 65 - 99 mg/dL Final    Comment:    .            Fasting reference interval . For someone  without known diabetes, a glucose value between 100 and 125 mg/dL is consistent with prediabetes and should be confirmed with a follow-up test. .     Patient Active Problem List   Diagnosis Date Noted   Degeneration of lumbar intervertebral disc 01/17/2021   CCC (chronic calculous cholecystitis) 03/14/2020   Knee effusion, right 03/18/2018   Cyst of right kidney 01/08/2018   Tobacco use 08/25/2017   Preventative health care 08/25/2017   Trigger point with back pain (Left) 07/21/2017   Acute postoperative pain 05/06/2017   Depression 01/30/2017   Neurogenic pain 01/14/2017   Lumbar facet syndrome (Bilateral) 12/18/2016   Lumbar spondylosis 12/18/2016   Chronic musculoskeletal pain 12/17/2016   Chronic pain syndrome 12/05/2016   Long term (current) use of opiate analgesic 12/05/2016   Long term prescription opiate use 12/05/2016   Opiate use (85.5 MME/Day) 12/05/2016   Chronic hip pain (Secondary source of pain) (Left) 12/05/2016   Failed back surgical syndrome 12/05/2016   Discogenic low back pain 12/05/2016   Lumbar discogenic pain syndrome 12/05/2016   Chronic sacroiliac joint pain (Left) 12/05/2016   Elevated rheumatoid factor 10/22/2016   Chronic, continuous use of opioids 10/03/2016   Polyarthralgia 10/02/2016   Low back pain 07/22/2016   Breast cancer screening 04/03/2015   Bladder irritation 04/03/2015   Lumbosacral spondylosis without myelopathy 03/12/2015   Lumbar central spinal stenosis (L4-5) 03/12/2015    Class: History of   Adjustment disorder with depressed mood 03/10/2015   Stressful life events affecting family and household 03/10/2015   Vitamin D deficiency 03/10/2015    Past Surgical History:  Procedure Laterality Date   ABDOMINAL HYSTERECTOMY     BACK SURGERY     two back sx one in [place] and one at [place]  09/09/2015   TONSILLECTOMY      Family History   Problem Relation Age of Onset   Hypertension Mother    Diabetes Mother    Gallbladder disease Mother    Hypertension Father    Hyperlipidemia Father    Arthritis Father    Lung disease Father    Heart disease Father    Lymphoma Father    Cancer Maternal Aunt        breast   Breast cancer Maternal Aunt 65   Depression Sister    Heart disease Maternal Grandfather    Depression Sister    Stroke Neg  Hx     Social History   Socioeconomic History   Marital status: Married    Spouse name: Not on file   Number of children: Not on file   Years of education: Not on file   Highest education level: Not on file  Occupational History   Not on file  Tobacco Use   Smoking status: Former    Packs/day: 1.00    Years: 25.00    Pack years: 25.00    Types: Cigarettes    Start date: 54    Quit date: 01/18/2020    Years since quitting: 1.0   Smokeless tobacco: Never  Vaping Use   Vaping Use: Former  Substance and Sexual Activity   Alcohol use: No   Drug use: No   Sexual activity: Not Currently    Comment: married, it's been a few years since active  Other Topics Concern   Not on file  Social History Narrative   Not on file   Social Determinants of Health   Financial Resource Strain: Low Risk    Difficulty of Paying Living Expenses: Not hard at all  Food Insecurity: No Food Insecurity   Worried About Charity fundraiser in the Last Year: Never true   Wann in the Last Year: Never true  Transportation Needs: No Transportation Needs   Lack of Transportation (Medical): No   Lack of Transportation (Non-Medical): No  Physical Activity: Inactive   Days of Exercise per Week: 0 days   Minutes of Exercise per Session: 0 min  Stress: No Stress Concern Present   Feeling of Stress : Only a little  Social Connections: Engineer, building services of Communication with Friends and Family: More than three times a week   Frequency of Social Gatherings with Friends and  Family: More than three times a week   Attends Religious Services: More than 4 times per year   Active Member of [employer] or Organizations: No   Attends Music therapist: More than 4 times per year   Marital Status: Married  Human resources officer Violence: Not At Risk   Fear of Current or Ex-Partner: No   Emotionally Abused: No   Physically Abused: No   Sexually Abused: No     Current Outpatient Medications:    methadone (DOLOPHINE) 10 MG tablet, Take 10 mg by mouth 2 (two) times daily., Disp: , Rfl:    Oxycodone HCl 10 MG TABS, Take 10 mg by mouth 2 (two) times daily as needed., Disp: , Rfl:    TURMERIC PO, Take 1 capsule by mouth at bedtime., Disp: , Rfl:    vitamin E 200 UNIT capsule, vitamin E (dl, acetate) 90 mg (200 unit) capsule  Take 1 capsule every day by oral route., Disp: , Rfl:    baclofen (LIORESAL) 10 MG tablet, baclofen 10 mg tablet  TAKE 1 TABLET BY MOUTH TWICE A DAY AS NEEDED (Patient not taking: Reported on 01/17/2021), Disp: , Rfl:    buPROPion ER (WELLBUTRIN SR) 100 MG 12 hr tablet, bupropion HCl SR 100 mg tablet,12 hr sustained-release  TAKE 1 TABLET BY MOUTH TWICE A DAY (Patient not taking: Reported on 01/17/2021), Disp: , Rfl:    celecoxib (CELEBREX) 200 MG capsule, Celebrex 200 mg capsule  Take 1 capsule every day by oral route for 30 days. (Patient not taking: Reported on 01/17/2021), Disp: , Rfl:    Cholecalciferol 10 MCG (400 UNIT) CAPS, Vitamin D3 10 mcg (400 unit) capsule (Patient  not taking: Reported on 01/17/2021), Disp: , Rfl:    cyclobenzaprine (FLEXERIL) 5 MG tablet, cyclobenzaprine 5 mg tablet  TAKE 1 TABLET BY MOUTH AT BEDTIME AS NEEDED (Patient not taking: Reported on 01/17/2021), Disp: , Rfl:    diazepam (VALIUM) 5 MG tablet, diazepam 5 mg tablet (Patient not taking: Reported on 01/17/2021), Disp: , Rfl:    DULoxetine (CYMBALTA) 30 MG capsule, duloxetine 30 mg capsule,delayed release  TAKE 3 CAPSULES (90 MG TOTAL) BY MOUTH DAILY. (Patient not taking:  Reported on 01/17/2021), Disp: , Rfl:    DULoxetine (CYMBALTA) 60 MG capsule, duloxetine 60 mg capsule,delayed release  TAKE 1 CAPSULE (60 MG TOTAL) BY MOUTH DAILY. APPOINTMENT NEEDED (Patient not taking: Reported on 01/17/2021), Disp: , Rfl:    ergocalciferol (VITAMIN D2) 1.25 MG (50000 UT) capsule, ergocalciferol (vitamin D2) 1,250 mcg (50,000 unit) capsule  TAKE 1 CAPSULE (50,000 UNIT) BY ORAL ROUTE ONCE WEEKLY FOR 30 DAYS (Patient not taking: Reported on 01/17/2021), Disp: , Rfl:    escitalopram (LEXAPRO) 20 MG tablet, escitalopram 20 mg tablet  TAKE 1 TABLET BY MOUTH ONCE A DAY (Patient not taking: Reported on 01/17/2021), Disp: , Rfl:    estradiol (VIVELLE-DOT) 0.1 MG/24HR patch, [place] 0.1 mg/24 hr transdermal patch  APPLY 1 PATCH BY TRANSDERMAL ROUTE TWICE WEEKLY FOR 30 DAYS (Patient not taking: Reported on 01/17/2021), Disp: , Rfl:    fexofenadine-pseudoephedrine (ALLEGRA-D 24) 180-240 MG 24 hr tablet, Allergy Relief D 180 mg-240 mg tablet,extended release  TAKE 1 TABLET EVERY DAY (Patient not taking: Reported on 01/17/2021), Disp: , Rfl:    gabapentin (NEURONTIN) 300 MG capsule, gabapentin 300 mg capsule  1 po qHS. Use caution with driving. (Patient not taking: Reported on 01/17/2021), Disp: , Rfl:    ibuprofen (ADVIL) 800 MG tablet, Take 1 tablet (800 mg total) by mouth every 8 (eight) hours as needed. (Patient not taking: Reported on 01/17/2021), Disp: 30 tablet, Rfl: 0   methocarbamol (ROBAXIN) 750 MG tablet, methocarbamol 750 mg tablet  TAKE 1 TO 2 TABLETS BY MOUTH EVERY 6 HOURS AS NEEDED FOR PAIN (Patient not taking: Reported on 01/17/2021), Disp: , Rfl:    naproxen (NAPRELAN) 500 MG 24 hr tablet, Naprelan CR 500 mg tab,extended release 24 hr mphase  Take 1 tablet every day by oral route with meals for 30 days. (Patient not taking: Reported on 01/17/2021), Disp: , Rfl:    oxyCODONE (OXY IR/ROXICODONE) 5 MG immediate release tablet, Take 1 tablet (5 mg total) by mouth every 6 (six) hours as needed  for severe pain. (Patient not taking: Reported on 01/17/2021), Disp: 15 tablet, Rfl: 0   phenazopyridine (PYRIDIUM) 200 MG tablet, phenazopyridine 200 mg tablet  TAKE 1 TABLET BY MOUTH 3 TIMES A DAY (Patient not taking: Reported on 01/17/2021), Disp: , Rfl:    progesterone (PROMETRIUM) 100 MG capsule, progesterone micronized 100 mg capsule  TAKE ONE CAPSULE BY MOUTH EVERY DAY (Patient not taking: Reported on 01/17/2021), Disp: , Rfl:    QUEtiapine (SEROQUEL XR) 400 MG 24 hr tablet, Seroquel XR 400 mg tablet,extended release  TAKE 1 TABLET 12 HRS BEFORE WAKE TIME (Patient not taking: Reported on 01/17/2021), Disp: , Rfl:    TraMADol HCl 100 MG TB24, tramadol ER 100 mg tablet,extended release 24hr mphase  take 1 tablet by mouth once daily (Patient not taking: Reported on 01/17/2021), Disp: , Rfl:   Allergies  Allergen Reactions   Codeine Nausea And Vomiting     ROS  Constitutional: Negative for fever or weight change.  Respiratory: Negative for cough and shortness of breath.   Cardiovascular: Negative for chest pain or palpitations.  Gastrointestinal: Negative for abdominal pain, no bowel changes.  Musculoskeletal: Negative for gait problem or joint swelling.  Skin: Negative for rash.  Neurological: Negative for dizziness or headache.  No other specific complaints in a complete review of systems (except as listed in HPI above).   Objective  Vitals:   01/17/21 1304  BP: 124/70  Pulse: 75  Resp: 14  Temp: 98.1 F (36.7 C)  TempSrc: Oral  SpO2: 97%  Weight: 175 lb 9.6 oz (79.7 kg)  Height: _0  (1.702 m)    Body mass index is 27.5 kg/m.  Physical Exam  Constitutional: Patient appears well-developed and well-nourished. No distress.  HENT: Head: Normocephalic and atraumatic. Ears: B TMs ok, no erythema or effusion; Nose: Nose normal. Mouth/Throat: Oropharynx is clear and moist. No oropharyngeal exudate.  Eyes: Conjunctivae and EOM are normal. Pupils are equal, round, and reactive to  light. No scleral icterus.  Neck: Normal range of motion. Neck supple. No JVD present. No thyromegaly present.  Cardiovascular: Normal rate, regular rhythm and normal heart sounds.  No murmur heard. No BLE edema. Pulmonary/Chest: Effort normal and breath sounds normal. No respiratory distress. Abdominal: Soft. Bowel sounds are normal, no distension. There is no tenderness. no masses Breast: no lumps or masses, no nipple discharge or rashes FEMALE GENITALIA:  External genitalia normal External urethra normal Vaginal vault normal without discharge or lesions Cervix normal without discharge or lesions Bimanual exam normal without masses RECTAL: not done  Musculoskeletal: not done  Neurological: he is alert and oriented to person, place, and time. No cranial nerve deficit. Coordination, balance, strength, speech and gait are normal.  Skin: Skin is warm and dry. No rash noted. No erythema.  Psychiatric: Patient has a normal mood and affect. behavior is normal. Judgment and thought content normal.   Fall Risk: Fall Risk  01/17/2021 06/28/2020 03/14/2020 03/14/2020 02/10/2020  Falls in the past year? 1 0 0 0 0  Number falls in past yr: 1 0 0 0 0  Injury with Fall? 0 0 0 0 0  Comment - - - - -  Risk for fall due to : History of fall(s) - - - -  Risk for fall due to: Comment - - - - -  Follow up Falls evaluation completed - - - Falls evaluation completed     Functional Status Survey: Is the patient deaf or have difficulty hearing?: No Does the patient have difficulty seeing, even when wearing glasses/contacts?: No Does the patient have difficulty concentrating, remembering, or making decisions?: No Does the patient have difficulty walking or climbing stairs?: Yes Does the patient have difficulty dressing or bathing?: No Does the patient have difficulty doing errands alone such as visiting a doctor's office or shopping?: No   Assessment & Plan  1. Well adult exam  - Hemoglobin A1c - Lipid  panel - HIV antibody (with reflex) - Hepatitis C antibody - Ambulatory referral to Gastroenterology - COMPLETE METABOLIC PANEL WITH GFR  2. Cervical cancer screening  - Cytology - PAP  3. Need for shingles vaccine  Patient refused  4. Need for hepatitis C screening test  - Hepatitis C antibody  5. Breast cancer screening by mammogram  Mammogram   6. Colon cancer screening  - Ambulatory referral to Gastroenterology  7. Need for pneumococcal vaccine  Discussed today, out of stock   8. Diabetes mellitus screening  - Hemoglobin  A1c   -USPSTF grade A and B recommendations reviewed with patient; age-appropriate recommendations, preventive care, screening tests, etc discussed and encouraged; healthy living encouraged; see AVS for patient education given to patient -Discussed importance of 150 minutes of physical activity weekly, eat two servings of fish weekly, eat one serving of tree nuts ( cashews, pistachios, pecans, almonds.Marland Kitchen) every other day, eat 6 servings of fruit/vegetables daily and drink plenty of water and avoid sweet beverages.

## 2021-01-18 LAB — HEPATITIS C ANTIBODY
Hepatitis C Ab: NONREACTIVE
SIGNAL TO CUT-OFF: 0.04 (ref <1.00)

## 2021-01-18 LAB — LIPID PANEL
Cholesterol: 180 mg/dL (ref <200)
HDL: 47 mg/dL — ABNORMAL LOW (ref >=50)
LDL Cholesterol (Calc): 102 mg/dL (calc) — ABNORMAL HIGH
Non-HDL Cholesterol (Calc): 133 mg/dL (calc) — ABNORMAL HIGH (ref <130)
Total CHOL/HDL Ratio: 3.8 (calc) (ref <5.0)
Triglycerides: 192 mg/dL — ABNORMAL HIGH (ref <150)

## 2021-01-18 LAB — COMPLETE METABOLIC PANEL WITH GFR
AG Ratio: 2.2 (calc) (ref 1.0–2.5)
ALT: 14 U/L (ref 6–29)
AST: 15 U/L (ref 10–35)
Albumin: 4.4 g/dL (ref 3.6–5.1)
Alkaline phosphatase (APISO): 58 U/L (ref 37–153)
BUN/Creatinine Ratio: 31 (calc) — ABNORMAL HIGH (ref 6–22)
BUN: 28 mg/dL — ABNORMAL HIGH (ref 7–25)
CO2: 26 mmol/L (ref 20–32)
Calcium: 9.3 mg/dL (ref 8.6–10.4)
Chloride: 104 mmol/L (ref 98–110)
Creat: 0.9 mg/dL (ref 0.50–1.03)
Globulin: 2 g/dL (calc) (ref 1.9–3.7)
Glucose, Bld: 126 mg/dL — ABNORMAL HIGH (ref 65–99)
Potassium: 4 mmol/L (ref 3.5–5.3)
Sodium: 141 mmol/L (ref 135–146)
Total Bilirubin: 0.4 mg/dL (ref 0.2–1.2)
Total Protein: 6.4 g/dL (ref 6.1–8.1)
eGFR: 77 mL/min/{1.73_m2} (ref >=60)

## 2021-01-18 LAB — HEMOGLOBIN A1C
Hgb A1c MFr Bld: 5.9 % of total Hgb — ABNORMAL HIGH (ref <5.7)
Mean Plasma Glucose: 123 mg/dL
eAG (mmol/L): 6.8 mmol/L

## 2021-01-18 LAB — HIV ANTIBODY (ROUTINE TESTING W REFLEX): HIV 1&2 Ab, 4th Generation: NONREACTIVE

## 2021-01-19 ENCOUNTER — Other Ambulatory Visit: Payer: Self-pay

## 2021-01-19 ENCOUNTER — Encounter: Payer: Self-pay | Admitting: Gastroenterology

## 2021-01-19 DIAGNOSIS — Z1211 Encounter for screening for malignant neoplasm of colon: Secondary | ICD-10-CM

## 2021-01-19 LAB — CYTOLOGY - PAP
Comment: NEGATIVE
Diagnosis: NEGATIVE
High risk HPV: NEGATIVE

## 2021-01-22 ENCOUNTER — Ambulatory Visit: Payer: 59 | Admitting: Anesthesiology

## 2021-01-22 ENCOUNTER — Encounter
Admission: RE | Disposition: A | Discharge status: Home / Self Care | Payer: Self-pay | Source: Home / Self Care | Attending: Gastroenterology

## 2021-01-22 ENCOUNTER — Other Ambulatory Visit: Payer: Self-pay

## 2021-01-22 ENCOUNTER — Encounter: Payer: Self-pay | Admitting: Gastroenterology

## 2021-01-22 ENCOUNTER — Ambulatory Visit
Admission: RE | Admit: 2021-01-22 | Discharge: 2021-01-22 | Disposition: A | Discharge status: Home / Self Care | Payer: 59 | Attending: Gastroenterology | Admitting: Gastroenterology

## 2021-01-22 DIAGNOSIS — Z90710 Acquired absence of both cervix and uterus: Secondary | ICD-10-CM | POA: Insufficient documentation

## 2021-01-22 DIAGNOSIS — Z87891 Personal history of nicotine dependence: Secondary | ICD-10-CM | POA: Diagnosis not present

## 2021-01-22 DIAGNOSIS — D128 Benign neoplasm of rectum: Secondary | ICD-10-CM | POA: Insufficient documentation

## 2021-01-22 DIAGNOSIS — Z8379 Family history of other diseases of the digestive system: Secondary | ICD-10-CM | POA: Diagnosis not present

## 2021-01-22 DIAGNOSIS — Z885 Allergy status to narcotic agent status: Secondary | ICD-10-CM | POA: Diagnosis not present

## 2021-01-22 DIAGNOSIS — Z1211 Encounter for screening for malignant neoplasm of colon: Secondary | ICD-10-CM

## 2021-01-22 DIAGNOSIS — K5730 Diverticulosis of large intestine without perforation or abscess without bleeding: Secondary | ICD-10-CM | POA: Insufficient documentation

## 2021-01-22 DIAGNOSIS — Z79899 Other long term (current) drug therapy: Secondary | ICD-10-CM | POA: Insufficient documentation

## 2021-01-22 DIAGNOSIS — K648 Other hemorrhoids: Secondary | ICD-10-CM | POA: Diagnosis not present

## 2021-01-22 DIAGNOSIS — K621 Rectal polyp: Secondary | ICD-10-CM | POA: Diagnosis not present

## 2021-01-22 DIAGNOSIS — Z98891 History of uterine scar from previous surgery: Secondary | ICD-10-CM | POA: Diagnosis not present

## 2021-01-22 HISTORY — PX: COLONOSCOPY WITH PROPOFOL: SHX5780

## 2021-01-22 HISTORY — PX: POLYPECTOMY: SHX5525

## 2021-01-22 SURGERY — COLONOSCOPY WITH PROPOFOL
Anesthesia: General | Site: Rectum

## 2021-01-22 MED ORDER — PROPOFOL 10 MG/ML IV BOLUS
INTRAVENOUS | Status: DC | PRN
  Administered 2021-01-22 (×5): 20 mg via INTRAVENOUS
  Administered 2021-01-22: 80 mg via INTRAVENOUS
  Administered 2021-01-22: 30 mg via INTRAVENOUS
  Administered 2021-01-22 (×3): 20 mg via INTRAVENOUS
  Administered 2021-01-22 (×2): 30 mg via INTRAVENOUS

## 2021-01-22 MED ORDER — SODIUM CHLORIDE 0.9 % IV SOLN: INTRAVENOUS | Status: DC

## 2021-01-22 MED ORDER — LACTATED RINGERS IV SOLN: INTRAVENOUS | Status: DC

## 2021-01-22 MED ORDER — STERILE WATER FOR IRRIGATION IR SOLN: Status: DC | PRN

## 2021-01-22 MED ORDER — ACETAMINOPHEN 160 MG/5ML PO SOLN: 325.0000 mg | ORAL | Status: DC | PRN

## 2021-01-22 MED ORDER — ONDANSETRON HCL 4 MG/2ML IJ SOLN: 4.0000 mg | Freq: Once | INTRAMUSCULAR | Status: DC | PRN

## 2021-01-22 MED ORDER — LIDOCAINE HCL (CARDIAC) PF 100 MG/5ML IV SOSY
PREFILLED_SYRINGE | INTRAVENOUS | Status: DC | PRN
  Administered 2021-01-22: 50 mg via INTRAVENOUS

## 2021-01-22 MED ORDER — ACETAMINOPHEN 325 MG PO TABS: 325.0000 mg | ORAL_TABLET | ORAL | Status: DC | PRN

## 2021-01-22 SURGICAL SUPPLY — 8 items
GOWN CVR UNV OPN BCK APRN NK (MISCELLANEOUS) ×2 IMPLANT
GOWN ISOL THUMB LOOP REG UNIV (MISCELLANEOUS) ×4
KIT PRC NS LF DISP ENDO (KITS) ×1 IMPLANT
KIT PROCEDURE OLYMPUS (KITS) ×2
MANIFOLD NEPTUNE II (INSTRUMENTS) ×2 IMPLANT
SNARE COLD EXACTO (MISCELLANEOUS) ×2 IMPLANT
TRAP ETRAP POLY (MISCELLANEOUS) ×2 IMPLANT
WATER STERILE IRR 250ML POUR (IV SOLUTION) ×2 IMPLANT

## 2021-01-22 NOTE — Anesthesia Preprocedure Evaluation (Signed)
Anesthesia Evaluation  Patient identified by MRN, date of birth, ID band Patient awake    Reviewed: Allergy & Precautions, H&P , NPO status , Patient's Chart, lab work & pertinent test results, reviewed documented beta blocker date and time   History of Anesthesia Complications Negative for: history of anesthetic complications  Airway Mallampati: IV  TM Distance: >3 FB Neck ROM: full    Dental  (+) Dental Advidsory Given, Teeth Intact   Pulmonary neg pulmonary ROS, former smoker,    Pulmonary exam normal breath sounds clear to auscultation       Cardiovascular Exercise Tolerance: Good negative cardio ROS Normal cardiovascular exam Rhythm:regular Rate:Normal     Neuro/Psych neg Seizures PSYCHIATRIC DISORDERS Anxiety Depression  Neuromuscular disease    GI/Hepatic negative GI ROS, Neg liver ROS,   Endo/Other  negative endocrine ROS  Renal/GU Renal disease (kidney cysts)  negative genitourinary   Musculoskeletal  (+) Arthritis ,   Abdominal   Peds  Hematology negative hematology ROS (+)   Anesthesia Other Findings Past Medical History: No date: Allergy No date: Anxiety No date: Arthritis 07/22/2016: Chronic bilateral low back pain     Comment:  Started in her 20's; managed by pain clinic No date: Depression 12/21/2017: Kidney cysts No date: Rheumatoid arthritis ([place])   Reproductive/Obstetrics negative OB ROS                             Anesthesia Physical  Anesthesia Plan  ASA: II  Anesthesia Plan: General   Post-op Pain Management:    Induction: Intravenous  PONV Risk Score and Plan: 3 and Ondansetron, Dexamethasone, Midazolam and Treatment may vary due to age or medical condition  Airway Management Planned: Nasal Cannula and Natural Airway  Additional Equipment:   Intra-op Plan:   Post-operative Plan: Extubation in OR  Informed Consent: I have reviewed the patients  History and Physical, chart, labs and discussed the procedure including the risks, benefits and alternatives for the proposed anesthesia with the patient or authorized representative who has indicated his/her understanding and acceptance.     Dental Advisory Given  Plan Discussed with: CRNA  Anesthesia Plan Comments:         Anesthesia Quick Evaluation  Patient Active Problem List   Diagnosis Date Noted  . Degeneration of lumbar intervertebral disc 01/17/2021  . CCC (chronic calculous cholecystitis) 03/14/2020  . Knee effusion, right 03/18/2018  . Cyst of right kidney 01/08/2018  . Tobacco use 08/25/2017  . Preventative health care 08/25/2017  . Trigger point with back pain (Left) 07/21/2017  . Acute postoperative pain 05/06/2017  . Depression 01/30/2017  . Neurogenic pain 01/14/2017  . Lumbar facet syndrome (Bilateral) 12/18/2016  . Lumbar spondylosis 12/18/2016  . Chronic musculoskeletal pain 12/17/2016  . Chronic pain syndrome 12/05/2016  . Long term (current) use of opiate analgesic 12/05/2016  . Long term prescription opiate use 12/05/2016  . Opiate use (85.5 MME/Day) 12/05/2016  . Chronic hip pain (Secondary source of pain) (Left) 12/05/2016  . Failed back surgical syndrome 12/05/2016  . Discogenic low back pain 12/05/2016  . Lumbar discogenic pain syndrome 12/05/2016  . Chronic sacroiliac joint pain (Left) 12/05/2016  . Elevated rheumatoid factor 10/22/2016  . Chronic, continuous use of opioids 10/03/2016  . Polyarthralgia 10/02/2016  . Low back pain 07/22/2016  . Breast cancer screening 04/03/2015  . Bladder irritation 04/03/2015  . Lumbosacral spondylosis without myelopathy 03/12/2015  . Lumbar central spinal stenosis (L4-5) 03/12/2015  Class: History of  . Adjustment disorder with depressed mood 03/10/2015  . Stressful life events affecting family and household 03/10/2015  . Vitamin D deficiency 03/10/2015    CBC Latest Ref Rng & Units 04/12/2020  02/10/2020 09/02/2019  WBC 4.0 - 10.5 K/uL 7.6 7.5 8.9  Hemoglobin 12.0 - 15.0 g/dL 14.1 14.7 14.6  Hematocrit 36.0 - 46.0 % 43.5 44.1 44.2  Platelets 150 - 400 K/uL 234 230 227   BMP Latest Ref Rng & Units 01/17/2021 04/12/2020 02/10/2020  Glucose 65 - 99 mg/dL 126(H) 121(H) 160(H)  BUN 7 - 25 mg/dL 28(H) 20 23  Creatinine 0.50 - 1.03 mg/dL 0.90 0.85 0.85  BUN/Creat Ratio 6 - 22 (calc) [id number]) - NOT APPLICABLE  Sodium A999333 - 146 mmol/L 141 138 139  Potassium 3.5 - 5.3 mmol/L 4.0 4.0 3.9  Chloride 98 - 110 mmol/L 104 101 102  CO2 20 - 32 mmol/L '26 27 27  '$ Calcium 8.6 - 10.4 mg/dL 9.3 9.5 9.6    Risks and benefits of anesthesia discussed at length, patient or surrogate demonstrates understanding. Appropriately NPO. Plan to proceed with anesthesia.  Champ Mungo, MD 01/22/21

## 2021-01-22 NOTE — Anesthesia Procedure Notes (Signed)
Date/Time: 01/22/2021 11:14 AM Performed by: Mayme Genta, CRNA Pre-anesthesia Checklist: Patient identified, Emergency Drugs available, Suction available, Timeout performed and Patient being monitored Patient Re-evaluated:Patient Re-evaluated prior to induction Oxygen Delivery Method: Nasal cannula Placement Confirmation: positive ETCO2

## 2021-01-22 NOTE — Op Note (Signed)
[facility] Gastroenterology Patient Name: [name] Procedure Date: 01/22/2021 11:07 AM MRN: [id number] Account #: [id number] Date of Birth: 08-05-1970 Admit Type: Outpatient Age: 50 Room: [facility] OR ROOM 01 Gender: Female Note Status: Finalized Procedure:             Colonoscopy Indications:           Screening for colorectal malignant neoplasm Providers:             Lucilla Lame MD, MD Referring MD:          Carola Frost. Roxan Hockey (Referring MD) Medicines:             Propofol per Anesthesia Complications:         No immediate complications. Procedure:             Pre-Anesthesia Assessment:                        - Prior to the procedure, a History and Physical was                         performed, and patient medications and allergies were                         reviewed. The patient's tolerance of previous                         anesthesia was also reviewed. The risks and benefits                         of the procedure and the sedation options and risks                         were discussed with the patient. All questions were                         answered, and informed consent was obtained. Prior                         Anticoagulants: The patient has taken no previous                         anticoagulant or antiplatelet agents. ASA Grade                         Assessment: II - A patient with mild systemic disease.                         After reviewing the risks and benefits, the patient                         was deemed in satisfactory condition to undergo the                         procedure.                        After obtaining informed consent, the colonoscope was  passed under direct vision. Throughout the procedure,                         the patient's blood pressure, pulse, and oxygen                         saturations were monitored continuously. The                         Colonoscope was introduced through the  anus and                         advanced to the the cecum, identified by appendiceal                         orifice and ileocecal valve. The colonoscopy was                         performed without difficulty. The patient tolerated                         the procedure well. The quality of the bowel                         preparation was adequate to identify polyps. Findings:      The perianal and digital rectal examinations were normal.      A 5 mm polyp was found in the rectum. The polyp was sessile. The polyp       was removed with a cold snare. Resection and retrieval were complete.      Non-bleeding internal hemorrhoids were found during retroflexion. The       hemorrhoids were Grade I (internal hemorrhoids that do not prolapse).      A few small-mouthed diverticula were found in the sigmoid colon. Impression:            - One 5 mm polyp in the rectum, removed with a cold                         snare. Resected and retrieved.                        - Non-bleeding internal hemorrhoids.                        - Diverticulosis in the sigmoid colon. Recommendation:        - Discharge patient to home.                        - Resume previous diet.                        - Continue present medications.                        - Await pathology results.                        - Repeat colonoscopy in 5 years for surveillance if  adenomatous otherwise 10 years. Procedure Code(s):     --- Professional ---                        [phone], Colonoscopy, flexible; with removal of                         tumor(s), polyp(s), or other lesion(s) by snare                         technique Diagnosis Code(s):     --- Professional ---                        Z12.11, Encounter for screening for malignant neoplasm                         of colon                        K62.1, Rectal polyp CPT copyright 2019 American Medical Association. All rights reserved. The codes documented in  this report are preliminary and upon coder review may  be revised to meet current compliance requirements. Lucilla Lame MD, MD 01/22/2021 11:36:32 AM This report has been signed electronically. Number of Addenda: 0 Note Initiated On: 01/22/2021 11:07 AM Scope Withdrawal Time: 0 hours 11 minutes 7 seconds  Total Procedure Duration: 0 hours 16 minutes 58 seconds  Estimated Blood Loss:  Estimated blood loss: none.      [facility]

## 2021-01-22 NOTE — H&P (Signed)
Lucilla Lame, MD [facility] [address], [place], [place] [address] Phone: [phone] Fax : [phone]  Primary Care Physician:  Towanda Malkin, MD Primary Gastroenterologist:  Dr. Allen Norris  Pre-Procedure History & Physical: HPI:  [name] is a 50 y.o. female is here for a screening colonoscopy.   Past Medical History:  Diagnosis Date   Allergy    Anxiety    Arthritis    Chronic bilateral low back pain 07/22/2016   Started in her 20's; managed by pain clinic   Depression    Kidney cysts 12/21/2017   Rheumatoid arthritis [facility])     Past Surgical History:  Procedure Laterality Date   ABDOMINAL HYSTERECTOMY     BACK SURGERY     two back sx one in [place] and one at [place]  09/09/2015   TONSILLECTOMY      Prior to Admission medications   Medication Sig Start Date End Date Taking? Authorizing Provider  methadone (DOLOPHINE) 10 MG tablet Take 10 mg by mouth 2 (two) times daily.   Yes [provider]  Oxycodone HCl 10 MG TABS Take 10 mg by mouth 2 (two) times daily as needed. 03/30/20  Yes [provider]  TURMERIC PO Take 1 capsule by mouth at bedtime.   Yes [provider]  vitamin E 200 UNIT capsule vitamin E (dl, acetate) 90 mg (200 unit) capsule  Take 1 capsule every day by oral route.   Yes [provider]  baclofen (LIORESAL) 10 MG tablet baclofen 10 mg tablet  TAKE 1 TABLET BY MOUTH TWICE A DAY AS NEEDED Patient not taking: Reported on 01/17/2021    [provider]  buPROPion ER (WELLBUTRIN SR) 100 MG 12 hr tablet bupropion HCl SR 100 mg tablet,12 hr sustained-release  TAKE 1 TABLET BY MOUTH TWICE A DAY Patient not taking: Reported on 01/17/2021    [provider]  celecoxib (CELEBREX) 200 MG capsule Celebrex 200 mg capsule  Take 1 capsule every day by oral route for 30 days. Patient not taking: Reported on 01/17/2021     [provider]  Cholecalciferol 10 MCG (400 UNIT) CAPS Vitamin D3 10 mcg (400 unit) capsule Patient not taking: Reported on 01/17/2021    [provider]  cyclobenzaprine (FLEXERIL) 5 MG tablet cyclobenzaprine 5 mg tablet  TAKE 1 TABLET BY MOUTH AT BEDTIME AS NEEDED Patient not taking: Reported on 01/17/2021    [provider]  diazepam (VALIUM) 5 MG tablet diazepam 5 mg tablet Patient not taking: Reported on 01/17/2021    [provider]  DULoxetine (CYMBALTA) 30 MG capsule duloxetine 30 mg capsule,delayed release  TAKE 3 CAPSULES (90 MG TOTAL) BY MOUTH DAILY. Patient not taking: Reported on 01/17/2021    [provider]  DULoxetine (CYMBALTA) 60 MG capsule duloxetine 60 mg capsule,delayed release  TAKE 1 CAPSULE (60 MG TOTAL) BY MOUTH DAILY. APPOINTMENT NEEDED Patient not taking: Reported on 01/17/2021    [provider]  ergocalciferol (VITAMIN D2) 1.25 MG (50000 UT) capsule ergocalciferol (vitamin D2) 1,250 mcg (50,000 unit) capsule  TAKE 1 CAPSULE (50,000 UNIT) BY ORAL ROUTE ONCE WEEKLY FOR 30 DAYS Patient not taking: Reported on 01/17/2021    [provider]  escitalopram (LEXAPRO) 20 MG tablet escitalopram 20 mg tablet  TAKE 1 TABLET BY MOUTH ONCE A DAY Patient not taking: Reported on  01/17/2021    [provider]  estradiol (VIVELLE-DOT) 0.1 MG/24HR patch Minivelle 0.1 mg/24 hr transdermal patch  APPLY 1 PATCH BY TRANSDERMAL ROUTE TWICE WEEKLY FOR 30 DAYS Patient not taking: Reported on 01/17/2021    [provider]  fexofenadine-pseudoephedrine (ALLEGRA-D 24) 180-240 MG 24 hr tablet Allergy Relief D 180 mg-240 mg tablet,extended release  TAKE 1 TABLET EVERY DAY Patient not taking: Reported on 01/17/2021    [provider]  gabapentin (NEURONTIN) 300 MG capsule gabapentin 300 mg capsule  1 po qHS. Use caution with driving. Patient not taking: Reported on 01/17/2021    [provider]   ibuprofen (ADVIL) 800 MG tablet Take 1 tablet (800 mg total) by mouth every 8 (eight) hours as needed. Patient not taking: Reported on 01/17/2021 04/14/20   Ronny Bacon, MD  methocarbamol (ROBAXIN) 750 MG tablet methocarbamol 750 mg tablet  TAKE 1 TO 2 TABLETS BY MOUTH EVERY 6 HOURS AS NEEDED FOR PAIN Patient not taking: Reported on 01/17/2021    [provider]  naproxen (NAPRELAN) 500 MG 24 hr tablet Naprelan CR 500 mg tab,extended release 24 hr mphase  Take 1 tablet every day by oral route with meals for 30 days. Patient not taking: Reported on 01/17/2021    [provider]  oxyCODONE (OXY IR/ROXICODONE) 5 MG immediate release tablet Take 1 tablet (5 mg total) by mouth every 6 (six) hours as needed for severe pain. Patient not taking: No sig reported 04/14/20   Ronny Bacon, MD  phenazopyridine (PYRIDIUM) 200 MG tablet phenazopyridine 200 mg tablet  TAKE 1 TABLET BY MOUTH 3 TIMES A DAY Patient not taking: Reported on 01/17/2021    [provider]  progesterone (PROMETRIUM) 100 MG capsule progesterone micronized 100 mg capsule  TAKE ONE CAPSULE BY MOUTH EVERY DAY Patient not taking: Reported on 01/17/2021    [provider]  QUEtiapine (SEROQUEL XR) 400 MG 24 hr tablet Seroquel XR 400 mg tablet,extended release  TAKE 1 TABLET 12 [place] TIME Patient not taking: Reported on 01/17/2021    [provider]  TraMADol HCl 100 MG TB24 tramadol ER 100 mg tablet,extended release 24hr mphase  take 1 tablet by mouth once daily Patient not taking: Reported on 01/17/2021    [provider]    Allergies as of 01/19/2021 - Review Complete 01/19/2021  Allergen Reaction Noted   Codeine Nausea And Vomiting 08/02/2015    Family History  Problem Relation Age of Onset   Hypertension Mother    Diabetes Mother    Gallbladder disease Mother    Hypertension Father    Hyperlipidemia Father    Arthritis Father    Lung disease Father     Heart disease Father    Lymphoma Father    Cancer Maternal Aunt        breast   Breast cancer Maternal Aunt 20   Depression Sister    Heart disease Maternal Grandfather    Depression Sister    Stroke Neg Hx     Social History   Socioeconomic History   Marital status: Married    Spouse name: Not on file   Number of children: Not on file   Years of education: Not on file   Highest education level: Not on file  Occupational History   Not on file  Tobacco Use   Smoking status: Former    Packs/day: 1.00    Years: 25.00    Pack years: 25.00    Types: Cigarettes  Start date: 33    Quit date: 01/18/2020    Years since quitting: 1.0   Smokeless tobacco: Never  Vaping Use   Vaping Use: Former  Substance and Sexual Activity   Alcohol use: No   Drug use: No   Sexual activity: Not Currently    Comment: married, it's been a few years since active  Other Topics Concern   Not on file  Social History Narrative   Not on file   Social Determinants of Health   Financial Resource Strain: Low Risk    Difficulty of Paying Living Expenses: Not hard at all  Food Insecurity: No Food Insecurity   Worried About Charity fundraiser in the Last Year: Never true   [place] in the Last Year: Never true  Transportation Needs: No Transportation Needs   Lack of Transportation (Medical): No   Lack of Transportation (Non-Medical): No  Physical Activity: Inactive   Days of Exercise per Week: 0 days   Minutes of Exercise per Session: 0 min  Stress: No Stress Concern Present   Feeling of Stress : Only a little  Social Connections: Engineer, building services of Communication with Friends and Family: More than three times a week   Frequency of Social Gatherings with Friends and Family: More than three times a week   Attends Religious Services: More than 4 times per year   Active Member of [employer] or Organizations: No   Attends Music therapist: More than 4 times per  year   Marital Status: Married  Human resources officer Violence: Not At Risk   Fear of Current or Ex-Partner: No   Emotionally Abused: No   Physically Abused: No   Sexually Abused: No    Review of Systems: See HPI, otherwise negative ROS  Physical Exam: Ht '5\' 7"'$  (1.702 m)   Wt 80.3 kg   BMI 27.72 kg/m  General:   Alert,  pleasant and cooperative in NAD Head:  Normocephalic and atraumatic. Neck:  Supple; no masses or thyromegaly. Lungs:  Clear throughout to auscultation.    Heart:  Regular rate and rhythm. Abdomen:  Soft, nontender and nondistended. Normal bowel sounds, without guarding, and without rebound.   Neurologic:  Alert and  oriented x4;  grossly normal neurologically.  Impression/Plan: [name] is now here to undergo a screening colonoscopy.  Risks, benefits, and alternatives regarding colonoscopy have been reviewed with the patient.  Questions have been answered.  All parties agreeable.

## 2021-01-22 NOTE — Anesthesia Postprocedure Evaluation (Signed)
Anesthesia Post Note  Patient: [name]  Procedure(s) Performed: COLONOSCOPY WITH BIOPSY (Rectum) POLYPECTOMY (Rectum)     Patient location during evaluation: PACU Anesthesia Type: General Level of consciousness: awake and alert Pain management: pain level controlled Vital Signs Assessment: post-procedure vital signs reviewed and stable Respiratory status: spontaneous breathing, nonlabored ventilation, respiratory function stable and patient connected to nasal cannula oxygen Cardiovascular status: blood pressure returned to baseline and stable Postop Assessment: no apparent nausea or vomiting Anesthetic complications: no   No notable events documented.  Sinda Du

## 2021-01-22 NOTE — Transfer of Care (Signed)
Immediate Anesthesia Transfer of Care Note  Patient: [name]  Procedure(s) Performed: COLONOSCOPY WITH BIOPSY (Rectum) POLYPECTOMY (Rectum)  Patient Location: PACU  Anesthesia Type: General  Level of Consciousness: awake, alert  and patient cooperative  Airway and Oxygen Therapy: Patient Spontanous Breathing and Patient connected to supplemental oxygen  Post-op Assessment: Post-op Vital signs reviewed, Patient's Cardiovascular Status Stable, Respiratory Function Stable, Patent Airway and No signs of Nausea or vomiting  Post-op Vital Signs: Reviewed and stable  Complications: No notable events documented.

## 2021-01-23 ENCOUNTER — Encounter: Payer: Self-pay | Admitting: Gastroenterology

## 2021-01-23 LAB — SURGICAL PATHOLOGY

## 2021-03-02 ENCOUNTER — Telehealth: Payer: Self-pay | Admitting: Gastroenterology

## 2021-03-02 NOTE — Telephone Encounter (Signed)
Gastroenterology Pre-Procedure Review  Request Date: 01/22/21 Requesting Physician: Dr. Allen Norris    [facility]  PATIENT REVIEW QUESTIONS: The patient responded to the following health history questions as indicated:    1. Are you having any GI issues? yes ( ) 2. Do you have a personal history of Polyps? no 3. Do you have a family history of Colon Cancer or Polyps? yes ( ) 4. Diabetes Mellitus? no 5. Joint replacements in the past 12 months?no 6. Major health problems in the past 3 months?no 7. Any artificial heart valves, MVP, or defibrillator?no    MEDICATIONS & ALLERGIES:    Patient reports the following regarding taking any anticoagulation/antiplatelet therapy:   Plavix, Coumadin, Eliquis, Xarelto, Lovenox, Pradaxa, Brilinta, or Effient? no Aspirin? no  Pulmonary disease: No BMI: 28.6 [facility]  Patient confirms/reports the following medications:  Current Outpatient Medications  Medication Sig Dispense Refill   baclofen (LIORESAL) 10 MG tablet baclofen 10 mg tablet  TAKE 1 TABLET BY MOUTH TWICE A DAY AS NEEDED (Patient not taking: Reported on 01/17/2021)     buPROPion ER (WELLBUTRIN SR) 100 MG 12 hr tablet bupropion HCl SR 100 mg tablet,12 hr sustained-release  TAKE 1 TABLET BY MOUTH TWICE A DAY (Patient not taking: Reported on 01/17/2021)     celecoxib (CELEBREX) 200 MG capsule Celebrex 200 mg capsule  Take 1 capsule every day by oral route for 30 days. (Patient not taking: Reported on 01/17/2021)     Cholecalciferol 10 MCG (400 UNIT) CAPS Vitamin D3 10 mcg (400 unit) capsule (Patient not taking: Reported on 01/17/2021)     cyclobenzaprine (FLEXERIL) 5 MG tablet cyclobenzaprine 5 mg tablet  TAKE 1 TABLET BY MOUTH AT BEDTIME AS NEEDED (Patient not taking: Reported on 01/17/2021)     diazepam (VALIUM) 5 MG tablet diazepam 5 mg tablet (Patient not taking: Reported on 01/17/2021)     DULoxetine (CYMBALTA) 30 MG capsule duloxetine 30 mg capsule,delayed release  TAKE 3 CAPSULES (90 MG TOTAL) BY  MOUTH DAILY. (Patient not taking: Reported on 01/17/2021)     DULoxetine (CYMBALTA) 60 MG capsule duloxetine 60 mg capsule,delayed release  TAKE 1 CAPSULE (60 MG TOTAL) BY MOUTH DAILY. APPOINTMENT NEEDED (Patient not taking: Reported on 01/17/2021)     ergocalciferol (VITAMIN D2) 1.25 MG (50000 UT) capsule ergocalciferol (vitamin D2) 1,250 mcg (50,000 unit) capsule  TAKE 1 CAPSULE (50,000 UNIT) BY ORAL ROUTE ONCE WEEKLY FOR 30 DAYS (Patient not taking: Reported on 01/17/2021)     escitalopram (LEXAPRO) 20 MG tablet escitalopram 20 mg tablet  TAKE 1 TABLET BY MOUTH ONCE A DAY (Patient not taking: Reported on 01/17/2021)     estradiol (VIVELLE-DOT) 0.1 MG/24HR patch Minivelle 0.1 mg/24 hr transdermal patch  APPLY 1 PATCH BY TRANSDERMAL ROUTE TWICE WEEKLY FOR 30 DAYS (Patient not taking: Reported on 01/17/2021)     fexofenadine-pseudoephedrine (ALLEGRA-D 24) 180-240 MG 24 hr tablet Allergy Relief D 180 mg-240 mg tablet,extended release  TAKE 1 TABLET EVERY DAY (Patient not taking: Reported on 01/17/2021)     gabapentin (NEURONTIN) 300 MG capsule gabapentin 300 mg capsule  1 po qHS. Use caution with driving. (Patient not taking: Reported on 01/17/2021)     ibuprofen (ADVIL) 800 MG tablet Take 1 tablet (800 mg total) by mouth every 8 (eight) hours as needed. (Patient not taking: Reported on 01/17/2021) 30 tablet 0   methadone (DOLOPHINE) 10 MG tablet Take 10 mg by mouth 2 (two) times daily.     methocarbamol (ROBAXIN) 750 MG tablet methocarbamol 750  mg tablet  TAKE 1 TO 2 TABLETS BY MOUTH EVERY 6 HOURS AS NEEDED FOR PAIN (Patient not taking: Reported on 01/17/2021)     naproxen (NAPRELAN) 500 MG 24 hr tablet Naprelan CR 500 mg tab,extended release 24 hr mphase  Take 1 tablet every day by oral route with meals for 30 days. (Patient not taking: Reported on 01/17/2021)     oxyCODONE (OXY IR/ROXICODONE) 5 MG immediate release tablet Take 1 tablet (5 mg total) by mouth every 6 (six) hours as needed for severe pain.  (Patient not taking: No sig reported) 15 tablet 0   Oxycodone HCl 10 MG TABS Take 10 mg by mouth 2 (two) times daily as needed.     phenazopyridine (PYRIDIUM) 200 MG tablet phenazopyridine 200 mg tablet  TAKE 1 TABLET BY MOUTH 3 TIMES A DAY (Patient not taking: Reported on 01/17/2021)     progesterone (PROMETRIUM) 100 MG capsule progesterone micronized 100 mg capsule  TAKE ONE CAPSULE BY MOUTH EVERY DAY (Patient not taking: Reported on 01/17/2021)     QUEtiapine (SEROQUEL XR) 400 MG 24 hr tablet Seroquel XR 400 mg tablet,extended release  TAKE 1 TABLET 12 HRS BEFORE WAKE TIME (Patient not taking: Reported on 01/17/2021)     TraMADol HCl 100 MG TB24 tramadol ER 100 mg tablet,extended release 24hr mphase  take 1 tablet by mouth once daily (Patient not taking: Reported on 01/17/2021)     TURMERIC PO Take 1 capsule by mouth at bedtime.     vitamin E 200 UNIT capsule vitamin E (dl, acetate) 90 mg (200 unit) capsule  Take 1 capsule every day by oral route.     No current facility-administered medications for this visit.    Patient confirms/reports the following allergies:  Allergies  Allergen Reactions   Codeine Nausea And Vomiting    No orders of the defined types were placed in this encounter.   AUTHORIZATION INFORMATION Primary Insurance: 1D#: Group #:  Secondary Insurance: 1D#: Group #:  SCHEDULE INFORMATION: Date:  Time: Location:

## 2021-03-19 ENCOUNTER — Ambulatory Visit: Payer: 59

## 2021-05-31 ENCOUNTER — Ambulatory Visit: Payer: Self-pay | Admitting: *Deleted

## 2021-05-31 NOTE — Telephone Encounter (Signed)
Called pt to inform her she has to be seen in office/ virtual visit to get something prescribed. Transferred her over to [facility] to get her scheduled if possible.

## 2021-05-31 NOTE — Telephone Encounter (Signed)
Reason for Disposition  [1] COVID-19 diagnosed by positive lab test (e.g., PCR, rapid self-test kit) AND [2] mild symptoms (e.g., cough, fever, others) AND [1] no complications or SOB  Answer Assessment - Initial Assessment Questions 1. COVID-19 DIAGNOSIS: "Who made your COVID-19 diagnosis?" "Was it confirmed by a positive lab test or self-test?" If not diagnosed by a doctor (or NP/PA), ask "Are there lots of cases (community spread) where you live?" Note: See public health department website, if unsure.     Pt called in tested positive for COvid.   I want an antiviral since Christmas is coming.   I'm hoarse, body aches, headache, nasal congestion and chest congestion.    Not coughing.   Sore throat.    No diarrhea or vomiting.   I have a change in my taste and smell.    This is 2nd time I had Covid. 2. COVID-19 EXPOSURE: "Was there any known exposure to COVID before the symptoms began?" CDC Definition of close contact: within 6 feet (2 meters) for a total of 15 minutes or more over a 24-hour period.      *No Answer* 3. ONSET: "When did the COVID-19 symptoms start?"      Yesterday   4. WORST SYMPTOM: "What is your worst symptom?" (e.g., cough, fever, shortness of breath, muscle aches)     Not asked 5. COUGH: "Do you have a cough?" If Yes, ask: "How bad is the cough?"       No 6. FEVER: "Do you have a fever?" If Yes, ask: "What is your temperature, how was it measured, and when did it start?"     No 7. RESPIRATORY STATUS: "Describe your breathing?" (e.g., shortness of breath, wheezing, unable to speak)      I'm having a little shortness of breath with walking around.   I'm having a little tightness in my chest from the congestion.   I think that's why I'm hoarse.   It feels tight at the top middle of my chest.     8. BETTER-SAME-WORSE: "Are you getting better, staying the same or getting worse compared to yesterday?"  If getting worse, ask, "In what way?"     *No Answer* 9. HIGH RISK DISEASE: "Do  you have any chronic medical problems?" (e.g., asthma, heart or lung disease, weak immune system, obesity, etc.)     No high risk diease.   She is interested in taking an antiviral.   10. VACCINE: "Have you had the COVID-19 vaccine?" If Yes, ask: "Which one, how many shots, when did you get it?"       *No Answer* 11. BOOSTER: "Have you received your COVID-19 booster?" If Yes, ask: "Which one and when did you get it?"       *No Answer* 12. PREGNANCY: "Is there any chance you are pregnant?" "When was your last menstrual period?"       *No Answer* 13. OTHER SYMPTOMS: "Do you have any other symptoms?"  (e.g., chills, fatigue, headache, loss of smell or taste, muscle pain, sore throat)       Tired and achy.    14. O2 SATURATION MONITOR:  "Do you use an oxygen saturation monitor (pulse oximeter) at home?" If Yes, ask "What is your reading (oxygen level) today?" "What is your usual oxygen saturation reading?" (e.g., 95%)       No  Protocols used: Coronavirus (COVID-19) Diagnosed or Suspected-A-AH  Chief Complaint: Pt requesting an antiviral.  Tested positive for Covid yesterday Symptoms: body aches,  hoarseness, chest tightness in center of chest, very mild shortness of breath with activity, fatigue, diminished taste and smell Frequency: constantly Pertinent Negatives: Patient denies fever, cough but does feel congested in chest. Disposition: _0 ED /_1 Urgent Care (no appt availability in office) / _2 Appointment(In office/virtual)/ _3  [facility]/ _4 Home Care/ _5 Refused Recommended Disposition  Additional Notes: Pt is requesting an antiviral since it is going into Christmas.   She had Covid a year ago but this time it seems to be hitting her a little faster and quicker than last time.   I instructed her to go to the ED if the shortness of breath or chest tightness became worse.   She said both of these symptoms were very slight at this time.     I sent her request to Dr. Ancil Boozer at  [facility]  I went over the home care advice with her too.

## 2021-06-01 ENCOUNTER — Telehealth (INDEPENDENT_AMBULATORY_CARE_PROVIDER_SITE_OTHER): Payer: 59 | Admitting: Nurse Practitioner

## 2021-06-01 DIAGNOSIS — U071 COVID-19: Secondary | ICD-10-CM

## 2021-06-01 NOTE — Progress Notes (Signed)
Name: [name]   MRN: [id number]    DOB: 1971-03-16   Date:06/01/2021       Progress Note  Subjective  Chief Complaint  Chief Complaint  Patient presents with   Covid Positive    Home test- 12/21, pt states off/ on fever   Headache   Sore Throat   Nasal Congestion    I connected with  [name]  on 06/01/21 at  1:00 PM EST by a video enabled telemedicine application and verified that I am speaking with the correct person using two identifiers.  I discussed the limitations of evaluation and management by telemedicine and the availability of in person appointments. The patient expressed understanding and agreed to proceed with a virtual visit  Staff also discussed with the patient that there may be a patient responsible charge related to this service. 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  She says she has been caring for her son and daughter who both have been ill.  She says she has had headache, fatigue, and nasal congestion.  She denies cough or shortness of breath. She wanted information on anti-viral. Discussed side effects and that it was not recommended for her due to not being high risk for hospitalization.  She says she has been treating symptoms with tylenol cold and flu.  Discussed OTC treatments for symptoms including vitamins C, D and zinc.  Discussed CDC guidelines on quarantine.    Patient Active Problem List   Diagnosis Date Noted   Encounter for screening colonoscopy    Rectal polyp    Degeneration of lumbar intervertebral disc 01/17/2021   CCC (chronic calculous cholecystitis) 03/14/2020   Knee effusion, right 03/18/2018   Cyst of right kidney 01/08/2018   Tobacco use 08/25/2017   Preventative health care 08/25/2017   Trigger point with back pain (Left) 07/21/2017   Acute postoperative pain 05/06/2017   Depression 01/30/2017   Neurogenic pain  01/14/2017   Lumbar facet syndrome (Bilateral) 12/18/2016   Lumbar spondylosis 12/18/2016   Chronic musculoskeletal pain 12/17/2016   Chronic pain syndrome 12/05/2016   Long term (current) use of opiate analgesic 12/05/2016   Long term prescription opiate use 12/05/2016   Opiate use (85.5 MME/Day) 12/05/2016   Chronic hip pain (Secondary source of pain) (Left) 12/05/2016   Failed back surgical syndrome 12/05/2016   Discogenic low back pain 12/05/2016   Lumbar discogenic pain syndrome 12/05/2016   Chronic sacroiliac joint pain (Left) 12/05/2016   Elevated rheumatoid factor 10/22/2016   Chronic, continuous use of opioids 10/03/2016   Polyarthralgia 10/02/2016   Low back pain 07/22/2016   Breast cancer screening 04/03/2015   Bladder irritation 04/03/2015   Lumbosacral spondylosis without myelopathy 03/12/2015   Lumbar central spinal stenosis (L4-5) 03/12/2015    Class: History of   Adjustment disorder with depressed mood 03/10/2015   Stressful life events affecting family and household 03/10/2015   Vitamin D deficiency 03/10/2015    Social History   Tobacco Use   Smoking status: Former    Packs/day: 1.00    Years: 25.00    Pack years: 25.00    Types: Cigarettes    Start date: 27    Quit date: 01/18/2020    Years since quitting: 1.3   Smokeless tobacco: Never  Substance Use Topics   Alcohol use: No     Current Outpatient Medications:    methadone (DOLOPHINE) 10  MG tablet, Take 10 mg by mouth 2 (two) times daily., Disp: , Rfl:    Oxycodone HCl 10 MG TABS, Take 10 mg by mouth 2 (two) times daily as needed., Disp: , Rfl:   Allergies  Allergen Reactions   Codeine Nausea And Vomiting    I personally reviewed active problem list, medication list, allergies, notes from last encounter with the patient/caregiver today.  ROS  Constitutional: Negative for fever or weight change.  HEENT: positive for nasal congestion  Respiratory: Negative for cough and shortness of  breath.   Cardiovascular: Negative for chest pain or palpitations.  Gastrointestinal: Negative for abdominal pain, no bowel changes.  Musculoskeletal: Negative for gait problem or joint swelling.  Skin: Negative for rash.  Neurological: Negative for dizziness or headache.  No other specific complaints in a complete review of systems (except as listed in HPI above).   Objective  Virtual encounter, vitals not obtained.  There is no height or weight on file to calculate BMI.  Nursing Note and Vital Signs reviewed.  Physical Exam  Awake, alert and oriented, speaking in complete sentences  No results found for this or any previous visit (from the past 72 hour(s)).  Assessment & Plan  1. COVID-19 -push fluids, get rest -discussed OTC treatment for symptoms  -follow CDC guidelines for quarantine   -Red flags and when to present for emergency care or RTC including fever >101.74F, chest pain, shortness of breath, new/worsening/un-resolving symptoms, reviewed with patient at time of visit. Follow up and care instructions discussed and provided in AVS. - I discussed the assessment and treatment plan with the patient. The patient was provided an opportunity to ask questions and all were answered. The patient agreed with the plan and demonstrated an understanding of the instructions.  I provided 15 minutes of non-face-to-face time during this encounter.  [name], FNP

## 2022-01-21 NOTE — Progress Notes (Deleted)
New patient visit   Patient: [name]   DOB: 10/01/1970   51 y.o. Female  MRN: [id number] Visit Date: 01/23/2022  Today's healthcare provider: Gwyneth Sprout, FNP   No chief complaint on file.  Subjective    [name] is a 51 y.o. female who presents today as a new patient to establish care.  HPI  ***  Past Medical History:  Diagnosis Date   Allergy    Anxiety    Arthritis    Chronic bilateral low back pain 07/22/2016   Started in her 20's; managed by pain clinic   Depression    Kidney cysts 12/21/2017   Rheumatoid arthritis ([place])    Past Surgical History:  Procedure Laterality Date   ABDOMINAL HYSTERECTOMY     BACK SURGERY     two back sx one in [place] and one at [facility] PROPOFOL N/A 01/22/2021   Procedure: COLONOSCOPY WITH BIOPSY;  Surgeon: Lucilla Lame, MD;  Location: [place];  Service: Endoscopy;  Laterality: N/A;   POLYPECTOMY N/A 01/22/2021   Procedure: POLYPECTOMY;  Surgeon: Lucilla Lame, MD;  Location: [place];  Service: Endoscopy;  Laterality: N/A;   [place] SUR[facility]ERY  09/09/2015   TONSILLECTOMY     Family Status  Relation Name Status   Mother  Alive   Father  Alive   Mat Aunt  (Not Specified)       breast cancer   Sister  Alive   Brother  Alive   MGM  Deceased       broken hip   MGF  Deceased       heart attack   Sister  Alive   Neg Hx  (Not Specified)   Family History  Problem Relation Age of Onset   Hypertension Mother    Diabetes Mother    Gallbladder disease Mother    Hypertension Father    Hyperlipidemia Father    Arthritis Father    Lung disease Father    Heart disease Father    Lymphoma Father    Cancer Maternal Aunt        breast   Breast cancer Maternal Aunt 96   Depression Sister    Heart disease Maternal Grandfather    Depression Sister    Stroke Neg Hx    Social History   Socioeconomic History   Marital  status: Married    Spouse name: Not on file   Number of children: Not on file   Years of education: Not on file   Highest education level: Not on file  Occupational History   Not on file  Tobacco Use   Smoking status: Former    Packs/day: 1.00    Years: 25.00    Total pack years: 25.00    Types: Cigarettes    Start date: 73    Quit date: 01/18/2020    Years since quitting: 2.0   Smokeless tobacco: Never  Vaping Use   Vaping Use: Former  Substance and Sexual Activity   Alcohol use: No   Drug use: No   Sexual activity: Not Currently    Comment: married, it's been a few years since active  Other Topics Concern   Not on file  Social History Narrative   Not on file   Social Determinants of Health   Financial Resource Strain: Low Risk  (  01/17/2021)   Overall Financial Resource Strain (CARDIA)    Difficulty of Paying Living Expenses: Not hard at all  Food Insecurity: No Food Insecurity (01/17/2021)   Hunger Vital Sign    Worried About Running Out of Food in the Last Year: Never true    Ran Out of Food in the Last Year: Never true  Transportation Needs: No Transportation Needs (01/17/2021)   PRAPARE - Hydrologist (Medical): No    Lack of Transportation (Non-Medical): No  Physical Activity: Inactive (01/17/2021)   Exercise Vital Sign    Days of Exercise per Week: 0 days    Minutes of Exercise per Session: 0 min  Stress: No Stress Concern Present (01/17/2021)   [place]    Feeling of Stress : Only a little  Social Connections: Socially Integrated (01/17/2021)   Social Connection and Isolation Panel [NHANES]    Frequency of Communication with Friends and Family: More than three times a week    Frequency of Social Gatherings with Friends and Family: More than three times a week    Attends Religious Services: More than 4 times per year    Active Member of Clubs or Organizations: No     Attends Music therapist: More than 4 times per year    Marital Status: Married   Outpatient Medications Prior to Visit  Medication Sig   methadone (DOLOPHINE) 10 MG tablet Take 10 mg by mouth 2 (two) times daily.   Oxycodone HCl 10 MG TABS Take 10 mg by mouth 2 (two) times daily as needed.   No facility-administered medications prior to visit.   Allergies  Allergen Reactions   Codeine Nausea And Vomiting    Immunization History  Administered Date(s) Administered   Influenza,inj,Quad PF,6+ Mos 08/02/2015, 07/22/2016, 03/17/2018   Influenza-Unspecified 03/11/2014   PFIZER(Purple Top)SARS-COV-2 Vaccination 09/08/2019   Tdap 03/11/2014    Health Maintenance  Topic Date Due   Zoster Vaccines- Shingrix (1 of 2) Never done   COVID-19 Vaccine (2 - Pfizer series) 11/03/2019   MAMMOGRAM  07/19/2021   INFLUENZA VACCINE  01/08/2022   TETANUS/TDAP  03/11/2024   PAP SMEAR-Modifier  01/17/2026   COLONOSCOPY (Pts 45-11yr Insurance coverage will need to be confirmed)  01/22/2026   Hepatitis C Screening  Completed   HIV Screening  Completed   HPV VACCINES  Aged Out    Patient Care Team: SSteele Sizer MD as PCP - General (Family Medicine) Derian, TNicholaus Corolla MD (Orthopedic Surgery) ARenie Ora MD as Referring Physician (Anesthesiology) LRico Junker RN as Registered Nurse STheodore Demark RN (Inactive) as Registered Nurse  Review of Systems  {Labs  Heme  Chem  Endocrine  Serology  Results Review (optional):23779}   Objective    There were no vitals taken for this visit. {Show previous vital signs (optional):23777}  Physical Exam ***  Depression Screen    06/01/2021    8:35 AM 01/17/2021    1:07 PM 06/28/2020    1:18 PM 02/10/2020    9:41 AM  PHQ 2/9 Scores  PHQ - 2 Score 0 2 0 0  PHQ- 9 Score 0 7 0    No results found for any visits on 01/23/22.  Assessment & Plan     ***  No follow-ups on file.     {provider  attestation***:1}   EGwyneth Sprout [phone](phone) [phone](fax)  [place]

## 2022-01-23 ENCOUNTER — Ambulatory Visit: Payer: Self-pay | Admitting: Family Medicine

## 2022-01-23 ENCOUNTER — Telehealth: Payer: Self-pay | Admitting: Family Medicine

## 2022-01-23 NOTE — Telephone Encounter (Signed)
Patient called in asking to establish as a new patient with Dr Silvio Pate. I told her I would have to get the approval first. Please advise.

## 2022-01-23 NOTE — Telephone Encounter (Signed)
Spoke to pt. She still wanted to establish with Dr Silvio Pate. Made appt 02-19-22.

## 2022-02-19 ENCOUNTER — Encounter: Payer: Self-pay | Admitting: Internal Medicine

## 2022-02-19 ENCOUNTER — Ambulatory Visit (INDEPENDENT_AMBULATORY_CARE_PROVIDER_SITE_OTHER): Payer: 59 | Admitting: Internal Medicine

## 2022-02-19 ENCOUNTER — Ambulatory Visit (INDEPENDENT_AMBULATORY_CARE_PROVIDER_SITE_OTHER)
Admission: RE | Admit: 2022-02-19 | Discharge: 2022-02-19 | Disposition: A | Discharge status: Home / Self Care | Payer: 59 | Source: Ambulatory Visit | Attending: Internal Medicine | Admitting: Internal Medicine

## 2022-02-19 VITALS — BP 100/76 | HR 68 | Temp 97.5°F | Ht 66.0 in | Wt 180.0 lb

## 2022-02-19 DIAGNOSIS — Z23 Encounter for immunization: Secondary | ICD-10-CM | POA: Diagnosis not present

## 2022-02-19 DIAGNOSIS — G894 Chronic pain syndrome: Secondary | ICD-10-CM | POA: Diagnosis not present

## 2022-02-19 DIAGNOSIS — M79641 Pain in right hand: Secondary | ICD-10-CM

## 2022-02-19 DIAGNOSIS — M79642 Pain in left hand: Secondary | ICD-10-CM

## 2022-02-19 DIAGNOSIS — M19042 Primary osteoarthritis, left hand: Secondary | ICD-10-CM | POA: Diagnosis not present

## 2022-02-19 DIAGNOSIS — R21 Rash and other nonspecific skin eruption: Secondary | ICD-10-CM | POA: Diagnosis not present

## 2022-02-19 DIAGNOSIS — M19041 Primary osteoarthritis, right hand: Secondary | ICD-10-CM | POA: Diagnosis not present

## 2022-02-19 LAB — CBC
HCT: 40.9 % (ref 36.0–46.0)
Hemoglobin: 13.7 g/dL (ref 12.0–15.0)
MCHC: 33.5 g/dL (ref 30.0–36.0)
MCV: 86.8 fl (ref 78.0–100.0)
Platelets: 215 10*3/uL (ref 150.0–400.0)
RBC: 4.71 Mil/uL (ref 3.87–5.11)
RDW: 13.6 % (ref 11.5–15.5)
WBC: 6.9 10*3/uL (ref 4.0–10.5)

## 2022-02-19 LAB — COMPREHENSIVE METABOLIC PANEL
ALT: 17 U/L (ref 0–35)
AST: 21 U/L (ref 0–37)
Albumin: 4.3 g/dL (ref 3.5–5.2)
Alkaline Phosphatase: 62 U/L (ref 39–117)
BUN: 21 mg/dL (ref 6–23)
CO2: 31 mEq/L (ref 19–32)
Calcium: 9.8 mg/dL (ref 8.4–10.5)
Chloride: 102 mEq/L (ref 96–112)
Creatinine, Ser: 0.92 mg/dL (ref 0.40–1.20)
GFR: 71.32 mL/min (ref >60.00)
Glucose, Bld: 82 mg/dL (ref 70–99)
Potassium: 5.1 mEq/L (ref 3.5–5.1)
Sodium: 138 mEq/L (ref 135–145)
Total Bilirubin: 0.6 mg/dL (ref 0.2–1.2)
Total Protein: 7.2 g/dL (ref 6.0–8.3)

## 2022-02-19 LAB — SEDIMENTATION RATE: Sed Rate: 23 mm/hr (ref 0–30)

## 2022-02-19 MED ORDER — TRIAMCINOLONE ACETONIDE 0.1 % EX CREA: 1.0000 | TOPICAL_CREAM | Freq: Two times a day (BID) | CUTANEOUS | 1 refills | Status: AC | PRN

## 2022-02-19 NOTE — Assessment & Plan Note (Signed)
Didn't think gabapentin or lyrica helped Discussed trying duloxetine

## 2022-02-19 NOTE — Progress Notes (Signed)
Subjective:    Patient ID: [name], female    DOB: 11-26-1970, 51 y.o.   MRN: [id number]  HPI Here to establish care Switching from [facility] ---lots of doctor changes there  Biggest concern is arthritis Has patches on skin--knees and fingers--thinks it is psoriasis Joint pains---primarily back due to past surgeries Now it is fingers/knees also No bad swelling but has knots on DIP  Sees Dr Royce Macadamia for pain management--in [facility]  Didn't tolerate tylenol or NSAIDs---felt her kidneys "locked up" in pain with these  Current Outpatient Medications on File Prior to Visit  Medication Sig Dispense Refill   methadone (DOLOPHINE) 10 MG tablet Take 10 mg by mouth 2 (two) times daily.     Multiple Vitamin (MULTIVITAMIN) capsule Take 1 capsule by mouth daily.     Oxycodone HCl 10 MG TABS Take 10 mg by mouth 2 (two) times daily as needed.     No current facility-administered medications on file prior to visit.    Allergies  Allergen Reactions   Codeine Nausea And Vomiting    Past Medical History:  Diagnosis Date   Allergy    Anxiety    Arthritis    Chronic bilateral low back pain 07/22/2016   Started in her 20's; managed by pain clinic   Depression    Kidney cysts 12/21/2017   Rheumatoid arthritis ([place])     Past Surgical History:  Procedure Laterality Date   ABDOMINAL HYSTERECTOMY     BACK SURGERY     two back sx one in [place] and one at [facility] PROPOFOL N/A 01/22/2021   Procedure: COLONOSCOPY WITH BIOPSY;  Surgeon: Lucilla Lame, MD;  Location: [place];  Service: Endoscopy;  Laterality: N/A;   POLYPECTOMY N/A 01/22/2021   Procedure: POLYPECTOMY;  Surgeon: Lucilla Lame, MD;  Location: [place];  Service: Endoscopy;  Laterality: N/A;   [place] SURGERY  09/09/2015   TONSILLECTOMY      Family History  Problem Relation Age of Onset   Hypertension Mother     Diabetes Mother    Gallbladder disease Mother    Hypertension Father    Hyperlipidemia Father    Arthritis Father    Lung disease Father    Heart disease Father    Lymphoma Father    Cancer Maternal Aunt        breast   Breast cancer Maternal Aunt 25   Depression Sister    Heart disease Maternal Grandfather    Depression Sister    Stroke Neg Hx     Social History   Socioeconomic History   Marital status: Married    Spouse name: Not on file   Number of children: 2   Years of education: Not on file   Highest education level: Not on file  Occupational History   Occupation: Hair stylist    Comment: own salon  Tobacco Use   Smoking status: Former    Packs/day: 1.00    Years: 25.00    Total pack years: 25.00    Types: Cigarettes    Start date: 61    Quit date: 01/18/2020    Years since quitting: 2.0    Passive exposure: Past   Smokeless tobacco: Never  Vaping Use   Vaping Use: Former  Substance and Sexual Activity   Alcohol use: No   Drug use: No  Sexual activity: Not Currently    Comment: married, it's been a few years since active  Other Topics Concern   Not on file  Social History Narrative   2 grown children   Social Determinants of Health   Financial Resource Strain: Low Risk  (01/17/2021)   Overall Financial Resource Strain (CARDIA)    Difficulty of Paying Living Expenses: Not hard at all  Food Insecurity: No Food Insecurity (01/17/2021)   Hunger Vital Sign    Worried About Running Out of Food in the Last Year: Never true    [place] in the Last Year: Never true  Transportation Needs: No Transportation Needs (01/17/2021)   PRAPARE - Hydrologist (Medical): No    Lack of Transportation (Non-Medical): No  Physical Activity: Inactive (01/17/2021)   Exercise Vital Sign    Days of Exercise per Week: 0 days    Minutes of Exercise per Session: 0 min  Stress: No Stress Concern Present (01/17/2021)   [place]    Feeling of Stress : Only a little  Social Connections: Socially Integrated (01/17/2021)   Social Connection and Isolation Panel [NHANES]    Frequency of Communication with Friends and Family: More than three times a week    Frequency of Social Gatherings with Friends and Family: More than three times a week    Attends Religious Services: More than 4 times per year    Active Member of [employer] or Organizations: No    Attends Music therapist: More than 4 times per year    Marital Status: Married  Human resources officer Violence: Not At Risk (01/17/2021)   Humiliation, Afraid, Rape, and Kick questionnaire    Fear of Current or Ex-Partner: No    Emotionally Abused: No    Physically Abused: No    Sexually Abused: No   Review of Systems  Constitutional:  Negative for unexpected weight change.       Appetite is okay  Eyes:  Negative for visual disturbance.  Respiratory:  Negative for cough and shortness of breath.   Cardiovascular:  Negative for chest pain, palpitations and leg swelling.  Gastrointestinal:  Negative for blood in stool and constipation.  Genitourinary:  Negative for dysuria and hematuria.  Musculoskeletal:  Positive for arthralgias and back pain.  Skin:  Positive for rash.  Neurological:  Negative for dizziness, syncope, light-headedness and headaches.  Hematological:  Negative for adenopathy. Does not bruise/bleed easily.  Psychiatric/Behavioral:  Negative for sleep disturbance.        Some chronic mood issues--depression strong in her family Doing okay now-----wellbutrin/lexapro/prozac---in the past       Objective:   Physical Exam Constitutional:      Appearance: Normal appearance.  Cardiovascular:     Rate and Rhythm: Normal rate and regular rhythm.     Heart sounds: No murmur heard.    No gallop.  Pulmonary:     Effort: Pulmonary effort is normal.     Breath sounds: Normal breath sounds. No  wheezing or rales.  Abdominal:     Palpations: Abdomen is soft.     Tenderness: There is no abdominal tenderness.  Musculoskeletal:     Cervical back: Neck supple.     Right lower leg: No edema.     Left lower leg: No edema.     Comments: Some DIP nodules on hands---mild swelling right PIPs but not clearly inflamed No swelling or inflammation  in knees  Lymphadenopathy:     Cervical: No cervical adenopathy.  Skin:    Comments: Hypertrophic areas on both knees (consistent with her kneeling to pray on wood floor)  Neurological:     Mental Status: She is alert.  Psychiatric:        Mood and Affect: Mood normal.        Behavior: Behavior normal.            Assessment & Plan:

## 2022-02-19 NOTE — Assessment & Plan Note (Signed)
Mild DIP nodules and non specific other findings I suggested that this is not likely psoriatic arthritis Will check x-rays to make sure no radiographic findngs

## 2022-02-19 NOTE — Assessment & Plan Note (Signed)
On knees This may be reactive Discussed using padding for kneeling Trial triamcinolone cream

## 2022-03-06 ENCOUNTER — Telehealth: Payer: Self-pay | Admitting: Family Medicine

## 2022-03-06 NOTE — Telephone Encounter (Signed)
Patient called to get her test results back. Call back number [phone].

## 2022-03-06 NOTE — Telephone Encounter (Signed)
Her labs were sent to [facility]. Left message on verified VM per DPR that they were normal.

## 2022-03-20 ENCOUNTER — Telehealth: Payer: Self-pay | Admitting: Internal Medicine

## 2022-03-20 NOTE — Telephone Encounter (Signed)
Patient called in and stated she believes she has a sinus infection. She was wanting to know if Dr. Silvio Pate would call an antibiotic in for her. Offered patient an appointment but she stated she has dealt with this before and knows its a sinus infection. Please advise. Thank you!

## 2022-03-20 NOTE — Telephone Encounter (Signed)
Spoke to pt. Made appt with Dr Diona Browner tomorrow at 1120.

## 2022-03-21 ENCOUNTER — Encounter: Payer: Self-pay | Admitting: Family Medicine

## 2022-03-21 ENCOUNTER — Ambulatory Visit (INDEPENDENT_AMBULATORY_CARE_PROVIDER_SITE_OTHER): Payer: 59 | Admitting: Family Medicine

## 2022-03-21 VITALS — BP 100/70 | HR 71 | Temp 98.0°F | Ht 66.0 in | Wt 178.0 lb

## 2022-03-21 DIAGNOSIS — J0110 Acute frontal sinusitis, unspecified: Secondary | ICD-10-CM

## 2022-03-21 MED ORDER — AMOXICILLIN 500 MG PO CAPS: 1000.0000 mg | ORAL_CAPSULE | Freq: Two times a day (BID) | ORAL | 0 refills | Status: DC

## 2022-03-21 NOTE — Assessment & Plan Note (Signed)
Acute, likely started as viral infection.. now given > 7-10 days with use of decongestant concerning for  bacterial superinfection.  treat with Amox  1000 mg BID x 10 days.  Start flonase and continue nasal saline irritation.  Can continue decongestant

## 2022-03-21 NOTE — Progress Notes (Signed)
Patient ID: [name], female    DOB: 1970/06/28, 51 y.o.   MRN: [id number]  This visit was conducted in person.  BP 100/70   Pulse 71   Temp 98 F (36.7 C) (Oral)   Ht '5\' 6"'$  (1.676 m)   Wt 178 lb (80.7 kg)   SpO2 95%   BMI 28.73 kg/m    CC:  Chief Complaint  Patient presents with   Nasal Congestion    Runny Nose x 3 weeks with green mucus No Covid Test     Subjective:   HPI: [name] is a 51 y.o. female   patient of Dr. Silvio Pate presenting on 03/21/2022 for Nasal Congestion (Runny Nose x 3 weeks with green mucus/No Covid Test/)      3 week of runny nose progressed to  hoarse voice, post nasal drip.  Has now moved down to chest, productive cough green phlegm.  Headache,  frontal facial pain and pressure.  Bilateral ear popping, pressure no pain  Subjective fever/chills.. noted in second week.   Some chest tightness, some OSB mild, no wheeze.   Using [employer], sudafed BID, nyquil at bedtime.  Also tried allergy symptoms.   Daughter and grand daughter sick... negative COVID test for them.   No chronic lung disease.  Former smoker for 20 years.  Relevant past medical, surgical, family and social history reviewed and updated as indicated. Interim medical history since our last visit reviewed. Allergies and medications reviewed and updated. Outpatient Medications Prior to Visit  Medication Sig Dispense Refill   gabapentin (NEURONTIN) 100 MG capsule Take 100-200 mg by mouth 3 (three) times daily as needed.     methadone (DOLOPHINE) 10 MG tablet Take 10 mg by mouth 2 (two) times daily.     Multiple Vitamin (MULTIVITAMIN) capsule Take 1 capsule by mouth daily.     naloxone (NARCAN) nasal spray 4 mg/0.1 mL SMARTSIG:Both Nares     Oxycodone HCl 10 MG TABS Take 10 mg by mouth 2 (two) times daily as needed.     triamcinolone cream (KENALOG) 0.1 % Apply 1 Application topically 2 (two) times daily as needed. 45 g 1   No facility-administered medications prior to visit.      Per HPI unless specifically indicated in ROS section below Review of Systems  Constitutional:  Negative for fatigue and fever.  HENT:  Positive for congestion, sinus pressure, sinus pain and sore throat. Negative for ear pain.   Eyes:  Negative for pain.  Respiratory:  Positive for cough and shortness of breath.   Cardiovascular:  Negative for chest pain, palpitations and leg swelling.  Gastrointestinal:  Negative for abdominal pain.  Genitourinary:  Negative for dysuria and vaginal bleeding.  Musculoskeletal:  Negative for back pain.  Neurological:  Negative for syncope, light-headedness and headaches.  Psychiatric/Behavioral:  Negative for dysphoric mood.    Objective:  BP 100/70   Pulse 71   Temp 98 F (36.7 C) (Oral)   Ht '5\' 6"'$  (1.676 m)   Wt 178 lb (80.7 kg)   SpO2 95%   BMI 28.73 kg/m   Wt Readings from Last 3 Encounters:  03/21/22 178 lb (80.7 kg)  02/19/22 180 lb (81.6 kg)  01/22/21 178 lb (80.7 kg)      Physical Exam Constitutional:      General: She is not in acute distress.    Appearance: She is well-developed. She is not ill-appearing or toxic-appearing.  HENT:     Head:  Normocephalic.     Right Ear: Hearing, ear canal and external ear normal. A middle ear effusion is present. Tympanic membrane is not erythematous, retracted or bulging.     Left Ear: Hearing, ear canal and external ear normal. A middle ear effusion is present. Tympanic membrane is not erythematous, retracted or bulging.     Nose: Mucosal edema and rhinorrhea present.     Right Turbinates: Enlarged and swollen.     Left Turbinates: Enlarged and swollen.     Right Sinus: Frontal sinus tenderness present. No maxillary sinus tenderness.     Left Sinus: Frontal sinus tenderness present. No maxillary sinus tenderness.     Mouth/Throat:     Pharynx: Uvula midline.  Eyes:     General: Lids are normal. Lids are everted, no foreign bodies appreciated.     Conjunctiva/sclera: Conjunctivae normal.      Pupils: Pupils are equal, round, and reactive to light.  Neck:     Thyroid: No thyroid mass or thyromegaly.     Vascular: No carotid bruit.     Trachea: Trachea normal.  Cardiovascular:     Rate and Rhythm: Normal rate and regular rhythm.     Pulses: Normal pulses.     Heart sounds: Normal heart sounds, S1 normal and S2 normal. No murmur heard.    No friction rub. No gallop.  Pulmonary:     Effort: Pulmonary effort is normal. No tachypnea or respiratory distress.     Breath sounds: Normal breath sounds. No decreased breath sounds, wheezing, rhonchi or rales.  Musculoskeletal:     Cervical back: Normal range of motion and neck supple.  Skin:    General: Skin is warm and dry.     Findings: No rash.  Neurological:     Mental Status: She is alert.  Psychiatric:        Mood and Affect: Mood is not anxious or depressed.        Speech: Speech normal.        Behavior: Behavior normal. Behavior is cooperative.        Judgment: Judgment normal.       Results for orders placed or performed in visit on 02/19/22  Comprehensive metabolic panel  Result Value Ref Range   Sodium 138 135 - 145 mEq/L   Potassium 5.1 3.5 - 5.1 mEq/L   Chloride 102 96 - 112 mEq/L   CO2 31 19 - 32 mEq/L   Glucose, Bld 82 70 - 99 mg/dL   BUN 21 6 - 23 mg/dL   Creatinine, Ser 0.92 0.40 - 1.20 mg/dL   Total Bilirubin 0.6 0.2 - 1.2 mg/dL   Alkaline Phosphatase 62 39 - 117 U/L   AST 21 0 - 37 U/L   ALT 17 0 - 35 U/L   Total Protein 7.2 6.0 - 8.3 g/dL   Albumin 4.3 3.5 - 5.2 g/dL   GFR 71.32 >60.00 mL/min   Calcium 9.8 8.4 - 10.5 mg/dL  CBC  Result Value Ref Range   WBC 6.9 4.0 - 10.5 K/uL   RBC 4.71 3.87 - 5.11 Mil/uL   Platelets 215.0 150.0 - 400.0 K/uL   Hemoglobin 13.7 12.0 - 15.0 g/dL   HCT 40.9 36.0 - 46.0 %   MCV 86.8 78.0 - 100.0 fl   MCHC 33.5 30.0 - 36.0 g/dL   RDW 13.6 11.5 - 15.5 %  Sedimentation rate  Result Value Ref Range   Sed Rate 23 0 - 30 mm/hr  COVID 19 screen:  No recent  travel or known exposure to COVID19 The patient denies respiratory symptoms of COVID 19 at this time. The importance of social distancing was discussed today.   Assessment and Plan    Problem List Items Addressed This Visit     Acute non-recurrent frontal sinusitis - Primary     Acute, likely started as viral infection.. now given > 7-10 days with use of decongestant concerning for  bacterial superinfection.  treat with Amox  1000 mg BID x 10 days.  Start flonase and continue nasal saline irritation.  Can continue decongestant      Relevant Medications   amoxicillin (AMOXIL) 500 MG capsule   Meds ordered this encounter  Medications   amoxicillin (AMOXIL) 500 MG capsule    Sig: Take 2 capsules (1,000 mg total) by mouth 2 (two) times daily.    Dispense:  40 capsule    Refill:  0     Eliezer Lofts, MD

## 2022-03-21 NOTE — Patient Instructions (Signed)
Start flonase 2 sprays per nostril daily.  Continue Netty pot for irrigation.  Complete course of antibiotics x 10 days.  Call if not improving or worsening.  If have severe shortness of breath .. Go to ER.

## 2022-03-27 ENCOUNTER — Other Ambulatory Visit: Payer: Self-pay | Admitting: Internal Medicine

## 2022-03-27 DIAGNOSIS — Z1231 Encounter for screening mammogram for malignant neoplasm of breast: Secondary | ICD-10-CM

## 2022-04-03 ENCOUNTER — Telehealth: Payer: Self-pay | Admitting: Internal Medicine

## 2022-04-03 NOTE — Telephone Encounter (Signed)
Called pt to verify the name of the Dr. It is Kellie Shropshire. I sent the labs to the fax that was provided per pt.

## 2022-04-03 NOTE — Telephone Encounter (Signed)
Patients pain management doctor at [facility] is requesting a copy of patients bloodwork that was done on September 12,2023. Fax number [phone]

## 2022-04-29 ENCOUNTER — Encounter: Payer: 59 | Admitting: Surgical

## 2022-05-01 ENCOUNTER — Ambulatory Visit
Admission: RE | Admit: 2022-05-01 | Discharge: 2022-05-01 | Disposition: A | Discharge status: Home / Self Care | Payer: 59 | Source: Ambulatory Visit | Attending: Internal Medicine | Admitting: Internal Medicine

## 2022-05-01 DIAGNOSIS — Z1231 Encounter for screening mammogram for malignant neoplasm of breast: Secondary | ICD-10-CM | POA: Insufficient documentation

## 2022-05-20 ENCOUNTER — Ambulatory Visit: Payer: 59 | Admitting: Internal Medicine

## 2022-05-22 ENCOUNTER — Ambulatory Visit: Payer: 59 | Admitting: Internal Medicine

## 2022-06-20 ENCOUNTER — Ambulatory Visit: Payer: Self-pay | Admitting: Internal Medicine

## 2022-07-01 ENCOUNTER — Ambulatory Visit (INDEPENDENT_AMBULATORY_CARE_PROVIDER_SITE_OTHER): Payer: BLUE CROSS/BLUE SHIELD | Admitting: Internal Medicine

## 2022-07-01 ENCOUNTER — Encounter: Payer: Self-pay | Admitting: Internal Medicine

## 2022-07-01 VITALS — BP 122/84 | HR 73 | Temp 97.6°F | Ht 65.0 in | Wt 185.0 lb

## 2022-07-01 DIAGNOSIS — F39 Unspecified mood [affective] disorder: Secondary | ICD-10-CM

## 2022-07-01 DIAGNOSIS — M79642 Pain in left hand: Secondary | ICD-10-CM | POA: Diagnosis not present

## 2022-07-01 DIAGNOSIS — R21 Rash and other nonspecific skin eruption: Secondary | ICD-10-CM

## 2022-07-01 DIAGNOSIS — M79641 Pain in right hand: Secondary | ICD-10-CM | POA: Diagnosis not present

## 2022-07-01 MED ORDER — BETAMETHASONE DIPROPIONATE 0.05 % EX OINT: TOPICAL_OINTMENT | Freq: Two times a day (BID) | CUTANEOUS | 1 refills | Status: DC | PRN

## 2022-07-01 MED ORDER — BUPROPION HCL ER (XL) 150 MG PO TB24: 150.0000 mg | ORAL_TABLET | Freq: Every day | ORAL | 3 refills | Status: DC

## 2022-07-01 NOTE — Progress Notes (Signed)
Subjective:    Patient ID: [name], female    DOB: [birth year], 52 y.o.   MRN: [id number]  HPI Here with several concerns  Rash on knees is worsening TAC not really helping Trying to put pad down when on knees to pray--to protect her knees Some knee aching Has aching in hands also---lock up at times (but she does hands)  Uses OTC topical Rx (neck and other spots)  Does take tylenol at times--usually not more than one per day (more and "my kidneys lock up")  Feels she has always had ADD Now having trouble getting things done---memory issues Son has ADHD and did better with meds Does get enough sleep Feels she has had chronic depression---stopped wellbutrin due to high cost many years ago   Current Outpatient Medications on File Prior to Visit  Medication Sig Dispense Refill   gabapentin (NEURONTIN) 100 MG capsule Take 100-200 mg by mouth 3 (three) times daily as needed.     methadone (DOLOPHINE) 10 MG tablet Take 10 mg by mouth 2 (two) times daily.     Multiple Vitamin (MULTIVITAMIN) capsule Take 1 capsule by mouth daily.     naloxone (NARCAN) nasal spray 4 mg/0.1 mL SMARTSIG:Both Nares     Oxycodone HCl 10 MG TABS Take 10 mg by mouth 2 (two) times daily as needed.     triamcinolone cream (KENALOG) 0.1 % Apply 1 Application topically 2 (two) times daily as needed. 45 g 1   No current facility-administered medications on file prior to visit.    Allergies  Allergen Reactions   Codeine Nausea And Vomiting    Past Medical History:  Diagnosis Date   Allergy    Anxiety    Arthritis    Chronic bilateral low back pain 07/22/2016   Started in her 20's; managed by pain clinic   Depression    Kidney cysts 12/21/2017   Rheumatoid arthritis ([place])     Past Surgical History:  Procedure Laterality Date   ABDOMINAL HYSTERECTOMY     BACK SURGERY     two back sx one in [place] and one at [place] PROPOFOL  N/A 01/22/2021   Procedure: COLONOSCOPY WITH BIOPSY;  Surgeon: Lucilla Lame, MD;  Location: [facility];  Service: Endoscopy;  Laterality: N/A;   POLYPECTOMY N/A 01/22/2021   Procedure: POLYPECTOMY;  Surgeon: Lucilla Lame, MD;  Location: [facility];  Service: Endoscopy;  Laterality: N/A;   [place] SURGERY  09/09/2015   TONSILLECTOMY      Family History  Problem Relation Age of Onset   Hypertension Mother    Diabetes Mother    Gallbladder disease Mother    Hypertension Father    Hyperlipidemia Father    Arthritis Father    Lung disease Father    Heart disease Father    Lymphoma Father    Cancer Maternal Aunt        breast   Breast cancer Maternal Aunt 86   Depression Sister    Heart disease Maternal Grandfather    Depression Sister    Stroke Neg Hx     Social History   Socioeconomic History   Marital status: Married    Spouse name: Not on file   Number of children: 2   Years of education: Not on file   Highest education level: Not on file  Occupational History  Occupation: Hair stylist    Comment: own salon  Tobacco Use   Smoking status: Former    Packs/day: 1.00    Years: 25.00    Total pack years: 25.00    Types: Cigarettes    Start date: 76    Quit date: 01/18/2020    Years since quitting: 2.4    Passive exposure: Past   Smokeless tobacco: Never  Vaping Use   Vaping Use: Former  Substance and Sexual Activity   Alcohol use: No   Drug use: No   Sexual activity: Not Currently    Comment: married, it's been a few years since active  Other Topics Concern   Not on file  Social History Narrative   2 grown children   Social Determinants of Health   Financial Resource Strain: Low Risk  (01/17/2021)   Overall Financial Resource Strain (CARDIA)    Difficulty of Paying Living Expenses: Not hard at all  Food Insecurity: No Food Insecurity (01/17/2021)   Hunger Vital Sign    Worried About Running Out of Food in the Last Year:  Never true    Ran Out of Food in the Last Year: Never true  Transportation Needs: No Transportation Needs (01/17/2021)   PRAPARE - Hydrologist (Medical): No    Lack of Transportation (Non-Medical): No  Physical Activity: Inactive (01/17/2021)   Exercise Vital Sign    Days of Exercise per Week: 0 days    Minutes of Exercise per Session: 0 min  Stress: No Stress Concern Present (01/17/2021)   [place]    Feeling of Stress : Only a little  Social Connections: Socially Integrated (01/17/2021)   Social Connection and Isolation Panel [NHANES]    Frequency of Communication with Friends and Family: More than three times a week    Frequency of Social Gatherings with Friends and Family: More than three times a week    Attends Religious Services: More than 4 times per year    Active Member of [employer] or Organizations: No    Attends Music therapist: More than 4 times per year    Marital Status: Married  Human resources officer Violence: Not At Risk (01/17/2021)   Humiliation, Afraid, Rape, and Kick questionnaire    Fear of Current or Ex-Partner: No    Emotionally Abused: No    Physically Abused: No    Sexually Abused: No   Review of Systems Concerned about her weight    Objective:   Physical Exam Constitutional:      Appearance: Normal appearance.  Musculoskeletal:        General: No deformity.  Skin:    Comments: Eczematous rash on right 5th MCP and under left ring finger (under ring) Thicker psoriatic plaques on knees--but no Koebner phenomenon  Neurological:     Mental Status: She is alert.  Psychiatric:        Mood and Affect: Mood normal.        Behavior: Behavior normal.            Assessment & Plan:

## 2022-07-01 NOTE — Assessment & Plan Note (Addendum)
Can try diclofenac for this Discussed tylenol arthritis 650 tid

## 2022-07-01 NOTE — Assessment & Plan Note (Signed)
Will try diprolene (stronger)

## 2022-07-01 NOTE — Patient Instructions (Signed)
Please try the stronger ointment for your rash. If it doesn't improve, the next step is a dermatologist. Try over the counter diclofenac gel for your aching joints (okay to use on hands/knees). Try acetaminophen arthritis formula '650mg'$  three times a day  DASH Eating Plan DASH stands for Dietary Approaches to Stop Hypertension. The DASH eating plan is a healthy eating plan that has been shown to: Reduce high blood pressure (hypertension). Reduce your risk for type 2 diabetes, heart disease, and stroke. Help with weight loss. What are tips for following this plan? Reading food labels Check food labels for the amount of salt (sodium) per serving. Choose foods with less than 5 percent of the Daily Value of sodium. Generally, foods with less than 300 milligrams (mg) of sodium per serving fit into this eating plan. To find whole grains, look for the word "whole" as the first word in the ingredient list. Shopping Buy products labeled as "low-sodium" or "no salt added." Buy fresh foods. Avoid canned foods and pre-made or frozen meals. Cooking Avoid adding salt when cooking. Use salt-free seasonings or herbs instead of table salt or sea salt. Check with your health care provider or pharmacist before using salt substitutes. Do not fry foods. Cook foods using healthy methods such as baking, boiling, grilling, roasting, and broiling instead. Cook with heart-healthy oils, such as olive, canola, avocado, soybean, or sunflower oil. Meal planning  Eat a balanced diet that includes: 4 or more servings of fruits and 4 or more servings of vegetables each day. Try to fill one-half of your plate with fruits and vegetables. 6-8 servings of whole grains each day. Less than 6 oz (170 g) of lean meat, poultry, or fish each day. A 3-oz (85-g) serving of meat is about the same size as a deck of cards. One egg equals 1 oz (28 g). 2-3 servings of low-fat dairy each day. One serving is 1 cup (237 mL). 1 serving of  nuts, seeds, or beans 5 times each week. 2-3 servings of heart-healthy fats. Healthy fats called omega-3 fatty acids are found in foods such as walnuts, flaxseeds, fortified milks, and eggs. These fats are also found in cold-water fish, such as sardines, salmon, and mackerel. Limit how much you eat of: Canned or prepackaged foods. Food that is high in trans fat, such as some fried foods. Food that is high in saturated fat, such as fatty meat. Desserts and other sweets, sugary drinks, and other foods with added sugar. Full-fat dairy products. Do not salt foods before eating. Do not eat more than 4 egg yolks a week. Try to eat at least 2 vegetarian meals a week. Eat more home-cooked food and less restaurant, buffet, and fast food. Lifestyle When eating at a restaurant, ask that your food be prepared with less salt or no salt, if possible. If you drink alcohol: Limit how much you use to: 0-1 drink a day for women who are not pregnant. 0-2 drinks a day for men. Be aware of how much alcohol is in your drink. In the [place] one drink equals one 12 oz bottle of beer (355 mL), one 5 oz glass of wine (148 mL), or one 1 oz glass of hard liquor (44 mL). General information Avoid eating more than 2,300 mg of salt a day. If you have hypertension, you may need to reduce your sodium intake to 1,500 mg a day. Work with your health care provider to maintain a healthy body weight or to lose weight. Ask what  an ideal weight is for you. Get at least 30 minutes of exercise that causes your heart to beat faster (aerobic exercise) most days of the week. Activities may include walking, swimming, or biking. Work with your health care provider or dietitian to adjust your eating plan to your individual calorie needs. What foods should I eat? Fruits All fresh, dried, or frozen fruit. Canned fruit in natural juice (without added sugar). Vegetables Fresh or frozen vegetables (raw, steamed, roasted, or grilled).  Low-sodium or reduced-sodium tomato and vegetable juice. Low-sodium or reduced-sodium tomato sauce and tomato paste. Low-sodium or reduced-sodium canned vegetables. Grains Whole-grain or whole-wheat bread. Whole-grain or whole-wheat pasta. Brown rice. [place]. Bulgur. Whole-grain and low-sodium cereals. Pita bread. Low-fat, low-sodium crackers. Whole-wheat flour tortillas. Meats and other proteins Skinless chicken or [place]. Ground chicken or [place]. Pork with fat trimmed off. Fish and seafood. Egg whites. Dried beans, peas, or lentils. Unsalted nuts, nut butters, and seeds. Unsalted canned beans. Lean cuts of beef with fat trimmed off. Low-sodium, lean precooked or cured meat, such as sausages or meat loaves. Dairy Low-fat (1%) or fat-free (skim) milk. Reduced-fat, low-fat, or fat-free cheeses. Nonfat, low-sodium ricotta or cottage cheese. Low-fat or nonfat yogurt. Low-fat, low-sodium cheese. Fats and oils Soft margarine without trans fats. Vegetable oil. Reduced-fat, low-fat, or light mayonnaise and salad dressings (reduced-sodium). Canola, safflower, olive, avocado, soybean, and sunflower oils. Avocado. Seasonings and condiments Herbs. Spices. Seasoning mixes without salt. Other foods Unsalted popcorn and pretzels. Fat-free sweets. The items listed above may not be a complete list of foods and beverages you can eat. Contact a dietitian for more information. What foods should I avoid? Fruits Canned fruit in a light or heavy syrup. Fried fruit. Fruit in cream or butter sauce. Vegetables Creamed or fried vegetables. Vegetables in a cheese sauce. Regular canned vegetables (not low-sodium or reduced-sodium). Regular canned tomato sauce and paste (not low-sodium or reduced-sodium). Regular tomato and vegetable juice (not low-sodium or reduced-sodium). Angie Fava. Olives. Grains Baked goods made with fat, such as croissants, muffins, or some breads. Dry pasta or rice meal packs. Meats and other  proteins Fatty cuts of meat. Ribs. Fried meat. Berniece Salines. Bologna, salami, and other precooked or cured meats, such as sausages or meat loaves. Fat from the back of a pig (fatback). Bratwurst. Salted nuts and seeds. Canned beans with added salt. Canned or smoked fish. Whole eggs or egg yolks. Chicken or [place] with skin. Dairy Whole or 2% milk, cream, and half-and-half. Whole or full-fat cream cheese. Whole-fat or sweetened yogurt. Full-fat cheese. Nondairy creamers. Whipped toppings. Processed cheese and cheese spreads. Fats and oils Butter. Stick margarine. Lard. Shortening. Ghee. Bacon fat. Tropical oils, such as coconut, palm kernel, or palm oil. Seasonings and condiments Onion salt, garlic salt, seasoned salt, table salt, and sea salt. Worcestershire sauce. Tartar sauce. Barbecue sauce. Teriyaki sauce. Soy sauce, including reduced-sodium. Steak sauce. Canned and packaged gravies. Fish sauce. Oyster sauce. Cocktail sauce. Store-bought horseradish. Ketchup. Mustard. Meat flavorings and tenderizers. Bouillon cubes. Hot sauces. Pre-made or packaged marinades. Pre-made or packaged taco seasonings. Relishes. Regular salad dressings. Other foods Salted popcorn and pretzels. The items listed above may not be a complete list of foods and beverages you should avoid. Contact a dietitian for more information. Where to find more information National Heart, Lung, and Blood Institute: https://wilson-eaton.com/ American Heart Association: www.heart.org Academy of Nutrition and Dietetics: www.eatright.[place]: www.kidney.org Summary The DASH eating plan is a healthy eating plan that has been shown to reduce high  blood pressure (hypertension). It may also reduce your risk for type 2 diabetes, heart disease, and stroke. When on the DASH eating plan, aim to eat more fresh fruits and vegetables, whole grains, lean proteins, low-fat dairy, and heart-healthy fats. With the DASH eating plan, you should  limit salt (sodium) intake to 2,300 mg a day. If you have hypertension, you may need to reduce your sodium intake to 1,500 mg a day. Work with your health care provider or dietitian to adjust your eating plan to your individual calorie needs. This information is not intended to replace advice given to you by your health care provider. Make sure you discuss any questions you have with your health care provider. Document Revised: 04/30/2019 Document Reviewed: 04/30/2019 Elsevier Patient Education  [place].

## 2022-07-01 NOTE — Assessment & Plan Note (Signed)
Chronic depression but doesn't seem like MDD Also with attention issues--and wellbutrin helped this Will try '150mg'$  daily

## 2022-07-15 ENCOUNTER — Telehealth: Payer: Self-pay | Admitting: Internal Medicine

## 2022-07-15 MED ORDER — BETAMETHASONE DIPROPIONATE 0.05 % EX CREA: TOPICAL_CREAM | Freq: Two times a day (BID) | CUTANEOUS | 1 refills | Status: AC

## 2022-07-15 NOTE — Telephone Encounter (Signed)
Spoke to pharmacy. They have betamethasone cream.

## 2022-07-15 NOTE — Telephone Encounter (Signed)
Sent in new rx for cream.

## 2022-07-15 NOTE — Addendum Note (Signed)
Addended by: Pilar Grammes on: 07/15/2022 04:58 PM   Modules accepted: Orders

## 2022-07-15 NOTE — Telephone Encounter (Signed)
Patient called stating that ointmentbetamethasone dipropionate (DIPROLENE) 0.05 % ointment  is on back order,and would like to know if another ointment can be called in for her?

## 2022-07-27 ENCOUNTER — Other Ambulatory Visit: Payer: Self-pay | Admitting: Internal Medicine

## 2022-08-22 ENCOUNTER — Telehealth: Payer: Self-pay | Admitting: Internal Medicine

## 2022-08-22 MED ORDER — BUPROPION HCL ER (SR) 150 MG PO TB12: 150.0000 mg | ORAL_TABLET | Freq: Every morning | ORAL | 1 refills | Status: AC

## 2022-08-22 NOTE — Addendum Note (Signed)
Addended by: Pilar Grammes on: 08/22/2022 04:55 PM   Modules accepted: Orders

## 2022-08-22 NOTE — Telephone Encounter (Signed)
Spoke to pt. She is willing to try the SR in the mornings. Sent rx. She has an OV 09-02-22.

## 2022-08-22 NOTE — Telephone Encounter (Signed)
Patient called in and wanted to know if her buPROPion (WELLBUTRIN XL) 150 MG 24 hr tablet could be reduced. She stated that she is having a hard time sleeping at night and was wondering if something else could be sent in. Please advise. Thank you!

## 2022-09-02 ENCOUNTER — Ambulatory Visit: Payer: BLUE CROSS/BLUE SHIELD | Admitting: Internal Medicine

## 2022-09-15 ENCOUNTER — Other Ambulatory Visit: Payer: Self-pay | Admitting: Internal Medicine

## 2022-09-16 ENCOUNTER — Ambulatory Visit: Payer: BLUE CROSS/BLUE SHIELD | Admitting: Internal Medicine

## 2022-09-16 ENCOUNTER — Encounter: Payer: Self-pay | Admitting: Internal Medicine

## 2022-12-30 ENCOUNTER — Telehealth: Payer: Self-pay | Admitting: Internal Medicine

## 2022-12-30 DIAGNOSIS — M25542 Pain in joints of left hand: Secondary | ICD-10-CM

## 2022-12-30 NOTE — Telephone Encounter (Signed)
Patient would like a referral sent over to rheumatology at [facility]. She said that she has a lot of pain in her joints and bones that she has seen Dr Alphonsus Sias for and he has been prescribing gel that has not been helping,so she would like to see a specialist.   [facility]  Fax number:[phone]

## 2022-12-31 NOTE — Telephone Encounter (Signed)
Tried to call pt. No answer and VM is full

## 2022-12-31 NOTE — Telephone Encounter (Signed)
Thank you :)

## 2022-12-31 NOTE — Telephone Encounter (Signed)
Patient called in returning a call she received. Relayed message below to patient and scheduled her to see Dr. Alphonsus Sias tomorrow as she is having pain all over in her joints.

## 2023-01-01 ENCOUNTER — Ambulatory Visit: Payer: BLUE CROSS/BLUE SHIELD | Admitting: Internal Medicine

## 2023-06-05 ENCOUNTER — Other Ambulatory Visit: Payer: Self-pay | Admitting: Internal Medicine

## 2023-06-05 DIAGNOSIS — Z1231 Encounter for screening mammogram for malignant neoplasm of breast: Secondary | ICD-10-CM

## 2023-06-16 ENCOUNTER — Ambulatory Visit
Admission: RE | Admit: 2023-06-16 | Discharge: 2023-06-16 | Disposition: A | Discharge status: Home / Self Care | Payer: BLUE CROSS/BLUE SHIELD | Source: Ambulatory Visit | Attending: Internal Medicine | Admitting: Internal Medicine

## 2023-06-16 DIAGNOSIS — Z1231 Encounter for screening mammogram for malignant neoplasm of breast: Secondary | ICD-10-CM | POA: Insufficient documentation

## 2023-08-05 ENCOUNTER — Ambulatory Visit: Payer: BLUE CROSS/BLUE SHIELD | Admitting: Family Medicine

## 2023-08-05 NOTE — Progress Notes (Deleted)
 New patient visit   Patient: [name]   DOB: 06/03/1971   53 y.o. Female  MRN: [id number]  Visit Date: 08/05/2023  Today's healthcare provider: Ronnald Ramp, MD   No chief complaint on file.  Subjective    [name] is a 53 y.o. female who presents today as a new patient to establish care.      Discussed the use of AI scribe software for clinical note transcription with the patient, who gave verbal consent to proceed.  History of Present Illness              Past Medical History:  Diagnosis Date   Allergy    Anxiety    Arthritis    Chronic bilateral low back pain 07/22/2016   Started in her 20's; managed by pain clinic   Depression    Kidney cysts 12/21/2017   Rheumatoid arthritis (HCC)     Outpatient Medications Prior to Visit  Medication Sig   betamethasone dipropionate 0.05 % cream Apply topically 2 (two) times daily.   buPROPion (WELLBUTRIN SR) 150 MG 12 hr tablet Take 1 tablet (150 mg total) by mouth in the morning.   gabapentin (NEURONTIN) 100 MG capsule Take 100-200 mg by mouth 3 (three) times daily as needed.   methadone (DOLOPHINE) 10 MG tablet Take 10 mg by mouth 2 (two) times daily.   Multiple Vitamin (MULTIVITAMIN) capsule Take 1 capsule by mouth daily.   naloxone (NARCAN) nasal spray 4 mg/0.1 mL SMARTSIG:Both Nares   Oxycodone HCl 10 MG TABS Take 10 mg by mouth 2 (two) times daily as needed.   triamcinolone cream (KENALOG) 0.1 % Apply 1 Application topically 2 (two) times daily as needed.   No facility-administered medications prior to visit.    Past Surgical History:  Procedure Laterality Date   ABDOMINAL HYSTERECTOMY     BACK SURGERY     two back sx one in [place] and one at [facility]   BREAST CYST ASPIRATION     CESAREAN SECTION     COLONOSCOPY WITH PROPOFOL N/A 01/22/2021   Procedure: COLONOSCOPY WITH BIOPSY;  Surgeon: Midge Minium, MD;  Location: [facility];  Service: Endoscopy;  Laterality: N/A;   POLYPECTOMY  N/A 01/22/2021   Procedure: POLYPECTOMY;  Surgeon: Midge Minium, MD;  Location: [facility];  Service: Endoscopy;  Laterality: N/A;   SPINE SURGERY     SPINE SURGERY  09/09/2015   TONSILLECTOMY     Family Status  Relation Name Status   Mother  Alive   Father  Alive   Mat Aunt  (Not Specified)       breast cancer   Sister  Alive   Brother  Alive   MGM  Deceased       broken hip   MGF  Deceased       heart attack   Sister  Alive   Neg Hx  (Not Specified)  No partnership data on file   Family History  Problem Relation Age of Onset   Hypertension Mother    Diabetes Mother    Gallbladder disease Mother    Hypertension Father    Hyperlipidemia Father    Arthritis Father    Lung disease Father    Heart disease Father    Lymphoma Father    Cancer Maternal Aunt        breast   Breast cancer Maternal Aunt 60   Depression Sister    Heart disease Maternal Grandfather  Depression Sister    Stroke Neg Hx    Social History   Socioeconomic History   Marital status: Married    Spouse name: Not on file   Number of children: 2   Years of education: Not on file   Highest education level: Not on file  Occupational History   Occupation: Hair stylist    Comment: own salon  Tobacco Use   Smoking status: Former    Current packs/day: 0.00    Average packs/day: 1 pack/day for 31.6 years (31.6 ttl pk-yrs)    Types: Cigarettes    Start date: 2    Quit date: 01/18/2020    Years since quitting: 3.5    Passive exposure: Past   Smokeless tobacco: Never  Vaping Use   Vaping status: Former  Substance and Sexual Activity   Alcohol use: No   Drug use: No   Sexual activity: Not Currently    Comment: married, it's been a few years since active  Other Topics Concern   Not on file  Social History Narrative   2 grown children   Social Drivers of Health   Financial Resource Strain: Low Risk  (01/17/2021)   Overall Financial Resource Strain (CARDIA)    Difficulty of Paying  Living Expenses: Not hard at all  Food Insecurity: No Food Insecurity (01/17/2021)   Hunger Vital Sign    Worried About Running Out of Food in the Last Year: Never true    Ran Out of Food in the Last Year: Never true  Transportation Needs: No Transportation Needs (01/17/2021)   PRAPARE - Administrator, Civil Service (Medical): No    Lack of Transportation (Non-Medical): No  Physical Activity: Inactive (01/17/2021)   Exercise Vital Sign    Days of Exercise per Week: 0 days    Minutes of Exercise per Session: 0 min  Stress: No Stress Concern Present (01/17/2021)   [employer] of Occupational Health - Occupational Stress Questionnaire    Feeling of Stress : Only a little  Social Connections: Socially Integrated (01/17/2021)   Social Connection and Isolation Panel [NHANES]    Frequency of Communication with Friends and Family: More than three times a week    Frequency of Social Gatherings with Friends and Family: More than three times a week    Attends Religious Services: More than 4 times per year    Active Member of Clubs or Organizations: No    Attends Engineer, structural: More than 4 times per year    Marital Status: Married     Allergies  Allergen Reactions   Codeine Nausea And Vomiting    Immunization History  Administered Date(s) Administered   Influenza,inj,Quad PF,6+ Mos 08/02/2015, 07/22/2016, 03/17/2018, 02/19/2022   Influenza-Unspecified 03/11/2014   PFIZER(Purple Top)SARS-COV-2 Vaccination 09/08/2019   Tdap 03/11/2014    Health Maintenance  Topic Date Due   Lung Cancer Screening  Never done   Zoster Vaccines- Shingrix (1 of 2) Never done   DTaP/Tdap/Td (2 - Td or Tdap) 03/11/2024   MAMMOGRAM  06/15/2024   Cervical Cancer Screening (HPV/Pap Cotest)  01/17/2026   Colonoscopy  01/22/2026   INFLUENZA VACCINE  Completed   Hepatitis C Screening  Completed   HIV Screening  Completed   HPV VACCINES  Aged Out   COVID-19 Vaccine  Discontinued     Patient Care Team: Karie Schwalbe, MD as PCP - General (Internal Medicine) Derian, Gabriela Eves, MD (Orthopedic Surgery) Alferd Patee, MD as Referring Physician (Anesthesiology)  Jim Like, RN as Registered Nurse Scarlett Presto, RN (Inactive) as Registered Nurse  Review of Systems  {Insert previous labs (optional):23779} {See past labs  Heme  Chem  Endocrine  Serology  Results Review (optional):1}   Objective    There were no vitals taken for this visit. {Insert last BP/Wt (optional):23777}{See vitals history (optional):1}    Depression Screen    02/19/2022    9:42 AM 06/01/2021    8:35 AM 01/17/2021    1:07 PM 06/28/2020    1:18 PM  PHQ 2/9 Scores  PHQ - 2 Score 0 0 2 0  PHQ- 9 Score  0 7 0   No results found for any visits on 08/05/23.   Physical Exam ***    Assessment & Plan      Problem List Items Addressed This Visit   None   Assessment and Plan               No follow-ups on file.      Ronnald Ramp, MD  [facility] [phone] (phone) [phone] (fax)  [facility]

## 2023-09-16 ENCOUNTER — Ambulatory Visit: Payer: BLUE CROSS/BLUE SHIELD | Admitting: Physician Assistant

## 2023-10-20 NOTE — Progress Notes (Signed)
 Office Visit Note  Patient: [name]             Date of Birth: 1971/04/06           MRN: [id number]             PCP: Deedra Farr, MD Referring: Laurice Pope, MD Visit Date: 10/27/2023 Occupation: @GUAROCC @  Subjective:  Pain in multiple joints   History of Present Illness: [name] is a 53 y.o. female who presents today for a new patient consultation.   Patient reports that she was diagnosed with psoriasis in 2023 by dermatology.  Patient states that she has tried several topical agents in the past but the chronic patches on both knees have never completely cleared.  She is not currently using any topical agents since the psoriasis is not currently bothersome.  Patient states that her primary concern has been progressively worsening pain involving multiple joints.  Patient states that her symptoms initially started with discomfort in both knees and both ankle joints but then started to affect her elbows, shoulders, and both hips.  Patient states that the joint pain was initially migratory and intermittent but has now been constant.  Her pain is most severe in the middle of the night and first thing in the mornings.  Her symptoms are typically alleviated with activity throughout the day.  She denies any obvious joint swelling.  She has been experiencing soreness in the plantar aspect of both feet.  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  Patient states that about 20 years ago she injured her left ankle but has been experiencing increased discomfort and stiffness in the left ankle.  She has established care with a podiatrist and has been taking meloxicam  7.5 mg twice daily for the past 1 month.  She has been using a compression sleeve on the left ankle.  She also takes methadone and oxycodone  prescribed for chronic pain due to 2 previous failed back surgeries.  She has also been taking tumeric and ginger for the natural  antiinflammatory properties.  She denies any history of uveitis. She denies any family history of any rheumatologic diseases.  She denies any personal or family history of IBD.   Activities of Daily Living:  Patient reports morning stiffness for 1-2 hours.   Patient Reports nocturnal pain.  Difficulty dressing/grooming: Denies Difficulty climbing stairs: Denies Difficulty getting out of chair: Denies Difficulty using hands for taps, buttons, cutlery, and/or writing: Reports  Review of Systems  Constitutional:  Positive for fatigue.  HENT:  Positive for mouth dryness. Negative for mouth sores.   Eyes:  Negative for dryness.  Respiratory:  Negative for shortness of breath.   Cardiovascular:  Positive for chest pain. Negative for palpitations.  Gastrointestinal:  Negative for blood in stool, constipation and diarrhea.  Endocrine: Negative for increased urination.  Genitourinary:  Negative for involuntary urination.  Musculoskeletal:  Positive for joint pain, joint pain, myalgias, muscle weakness, morning stiffness and myalgias. Negative for gait problem, joint swelling and muscle tenderness.  Skin:  Positive for rash. Negative for color change, hair loss and sensitivity to sunlight.  Allergic/Immunologic: Negative for susceptible to infections.  Neurological:  Negative for dizziness and headaches.  Hematological:  Negative for swollen glands.  Psychiatric/Behavioral:  Positive for depressed mood. Negative for sleep disturbance. The patient is not nervous/anxious.     PMFS History:  Patient Active Problem List  Diagnosis Date Noted   Acute non-recurrent frontal sinusitis 03/21/2022   Bilateral hand pain 02/19/2022   Rash 02/19/2022   Rectal polyp    Degeneration of lumbar intervertebral disc 01/17/2021   CCC (chronic calculous cholecystitis) 03/14/2020   Cyst of right kidney 01/08/2018   Tobacco use 08/25/2017   Preventative health care 08/25/2017   Trigger point with back pain  (Left) 07/21/2017   Mood disorder (HCC) 01/30/2017   Neurogenic pain 01/14/2017   Lumbar facet syndrome (Bilateral) 12/18/2016   Lumbar spondylosis 12/18/2016   Chronic musculoskeletal pain 12/17/2016   Chronic pain syndrome 12/05/2016   Long term (current) use of opiate analgesic 12/05/2016   Long term prescription opiate use 12/05/2016   Opiate use (85.5 MME/Day) 12/05/2016   Chronic hip pain (Secondary source of pain) (Left) 12/05/2016   Failed back surgical syndrome 12/05/2016   Discogenic low back pain 12/05/2016   Lumbar discogenic pain syndrome 12/05/2016   Chronic sacroiliac joint pain (Left) 12/05/2016   Elevated rheumatoid factor 10/22/2016   Chronic, continuous use of opioids 10/03/2016   Polyarthralgia 10/02/2016   Low back pain 07/22/2016   Lumbosacral spondylosis without myelopathy 03/12/2015   Lumbar central spinal stenosis (L4-5) 03/12/2015    Class: History of   Adjustment disorder with depressed mood 03/10/2015   Stressful life events affecting family and household 03/10/2015   Vitamin D  deficiency 03/10/2015    Past Medical History:  Diagnosis Date   Allergy    Anxiety    Arthritis    Chronic bilateral low back pain 07/22/2016   Started in her 20's; managed by pain clinic   Depression    Kidney cysts 12/21/2017   Rheumatoid arthritis (HCC)     Family History  Problem Relation Age of Onset   Hypertension Mother    Diabetes Mother    Gallbladder disease Mother    Hypertension Father    Hyperlipidemia Father    Arthritis Father    Lung disease Father    Heart disease Father    Lymphoma Father    Depression Sister    Depression Sister    Healthy Brother    Heart disease Maternal Grandfather    Cancer Maternal Aunt        breast   Breast cancer Maternal Aunt 34   Stroke Neg Hx    Past Surgical History:  Procedure Laterality Date   ABDOMINAL HYSTERECTOMY     BACK SURGERY     two back sx one in [place] and one at [facility]   BREAST CYST  ASPIRATION     CESAREAN SECTION     COLONOSCOPY WITH PROPOFOL  N/A 01/22/2021   Procedure: COLONOSCOPY WITH BIOPSY;  Surgeon: Marnee Sink, MD;  Location: [facility];  Service: Endoscopy;  Laterality: N/A;   POLYPECTOMY N/A 01/22/2021   Procedure: POLYPECTOMY;  Surgeon: Marnee Sink, MD;  Location: [facility];  Service: Endoscopy;  Laterality: N/A;   SPINE SURGERY     SPINE SURGERY  09/09/2015   TONSILLECTOMY     Social History   Social History Narrative   2 grown children   Immunization History  Administered Date(s) Administered   Influenza,inj,Quad PF,6+ Mos 08/02/2015, 07/22/2016, 03/17/2018, 02/19/2022   Influenza-Unspecified 03/11/2014   PFIZER(Purple Top)SARS-COV-2 Vaccination 09/08/2019   Tdap 03/11/2014     Objective: Vital Signs: BP 121/84 (BP Location: Right Arm, Patient Position: Sitting, Cuff Size: Normal)   Pulse 80   Resp 16   Ht 5\' 6"  (1.676 m)   Wt 172 lb  6.4 oz (78.2 kg)   BMI 27.83 kg/m    Physical Exam Vitals and nursing note reviewed.  Constitutional:      Appearance: She is well-developed.  HENT:     Head: Normocephalic and atraumatic.  Eyes:     Conjunctiva/sclera: Conjunctivae normal.  Cardiovascular:     Rate and Rhythm: Normal rate and regular rhythm.     Heart sounds: Normal heart sounds.  Pulmonary:     Effort: Pulmonary effort is normal.     Breath sounds: Normal breath sounds.  Abdominal:     General: Bowel sounds are normal.     Palpations: Abdomen is soft.  Musculoskeletal:     Cervical back: Normal range of motion.  Lymphadenopathy:     Cervical: No cervical adenopathy.  Skin:    General: Skin is warm and dry.     Capillary Refill: Capillary refill takes less than 2 seconds.     Comments: Plaque psoriasis on anterior aspect of both knees.   No nail pitting noted.   Neurological:     Mental Status: She is alert and oriented to person, place, and time.  Psychiatric:        Behavior: Behavior normal.          Musculoskeletal Exam: C-spine has good ROM with no discomfort.  Shoulder joints have good ROM with no discomfort.  Elbow joints, wrist joints, and MCP joints have good ROM with no discomfort.  PIP and DIP thickening consistent with osteoarthritis of both hands.  Hip joints have good ROM with no groin pain.  Knee joints have good ROM with no warmth.  Fullness in the left knee noted.  Tenderness of both ankles.  Tenderness along the plantar fascia of both feet.  PIP and DIP thickening.  Thickening of bilateral 1st MTP joints.   CDAI Exam: CDAI Score: -- Patient Global: --; Provider Global: -- Swollen: --; Tender: -- Joint Exam 10/27/2023   No joint exam has been documented for this visit   There is currently no information documented on the homunculus. Go to the Rheumatology activity and complete the homunculus joint exam.  Investigation: No additional findings.  Imaging: No results found.  Recent Labs: Lab Results  Component Value Date   WBC 6.9 02/19/2022   HGB 13.7 02/19/2022   PLT 215.0 02/19/2022   NA 138 02/19/2022   K 5.1 02/19/2022   CL 102 02/19/2022   CO2 31 02/19/2022   GLUCOSE 82 02/19/2022   BUN 21 02/19/2022   CREATININE 0.92 02/19/2022   BILITOT 0.6 02/19/2022   ALKPHOS 62 02/19/2022   AST 21 02/19/2022   ALT 17 02/19/2022   PROT 7.2 02/19/2022   ALBUMIN 4.3 02/19/2022   CALCIUM 9.8 02/19/2022   GFRAA 92 02/10/2020    Speciality Comments: No specialty comments available.  Procedures:  No procedures performed   Assessment / Plan:     Visit Diagnoses: Psoriasis - Dx by dermatology 2023-Dr. Burdette Carolin. Tried several topical agents-betamethasone , triamcinolone , and Fluocinonide 0.05% cream BID PRN:  Patient currently has plaque psoriasis on the anterior aspect of both knees-photo attached above.  She is not currently using any topical agents due to inadequate response.  According to the patient these patches have never cleared with topical agents. No nail  pitting noted on examination today.  No nail dystrophy noted. Patient's been experiencing progressively worsening joint pain and stiffness involving multiple joints over the past 2 years.  She is also experiencing plantar fasciitis involving both feet.  Patient's  been taking meloxicam  7.5 mg 1 tablet twice daily for the past 1 month as well as turmeric and ginger for natural anti-inflammatory properties.  She remains under the care of pain management and is taking oxycodone  and methadone to manage her lower back pain from failed surgical intervention in the past. She has osteoarthritic changes involving both hands and both feet noted on examination today.  No dactylitis or obvious synovitis was noted.  Some fullness was noted in the left knee but no obvious effusion was noted.  Tenderness of both ankles noted on exam.  Plan to obtain the following x-rays and labs today for further evaluation.  Results will be discussed at her new patient follow-up visit as well as treatment options going forward.  - Plan: Rheumatoid factor, Cyclic citrul peptide antibody, IgG, Sedimentation rate, C-reactive protein, Uric acid, HLA-B27 antigen, CBC with Differential/Platelet, Comprehensive metabolic panel with GFR, ANA  Polyarthralgia - Psoriasis, polyarthralgia, plantar fasciitis bilaterally, morning stiffness, nocturnal pain: No personal or family history of IBD.  No personal or family history of uveitis.  No family history of psoriasis.  Patient has been experiencing progressively worsening joint pain involving multiple joints.  She experiences nocturnal symptoms as well as morning stiffness.  Her joint pain typically improves with activity.  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  She takes Tylenol  arthritis as needed and meloxicam  7.5 mg 1 tablet twice daily for the past 1 month.  She also remains on oxycodone  and methadone by  the pain management clinic due to chronic pain in her lower back from a previous failed surgery.  She has not noticed any joint swelling but has soreness and stiffness involving multiple joints.  She has also been experiencing significant fatigue.  Plan to obtain the following lab work today along with the following x-rays for further evaluation. - Plan: Rheumatoid factor, Cyclic citrul peptide antibody, IgG, Sedimentation rate, C-reactive protein, Uric acid, HLA-B27 antigen, CBC with Differential/Platelet, Comprehensive metabolic panel with GFR, ANA  Elevated rheumatoid factor -RF was 15 on 03/17/2018.  RF was 23 on 10/02/2016. No known family history of rheumatoid arthritis.  Plan to obtain the following lab work today for further evaluation.-plan: Rheumatoid factor, Cyclic citrul peptide antibody, IgG, Sedimentation rate, C-reactive protein  Bilateral hand pain - 02/19/22: Mild degenerative changes in interphalangeal joints bilaterally, right > left.  Patient presents today for further evaluation of pain and stiffness involving both hands.  Her symptoms are most severe in the middle of the night and first thing in the morning.  She has PIP and DIP thickening consistent with osteoarthritis of both hands.  Plan to obtain x-rays today to assess for radiographic progression.  Plan to also obtain the following lab work for further evaluation.  May require an ultrasound to assess for synovitis.  Plan: Rheumatoid factor, Cyclic citrul peptide antibody, IgG, Sedimentation rate, C-reactive protein  Pain in both hands -Chronic pain.  Osteoarthritic changes noted bilaterally.  No obvious synovitis or dactylitis noted.  X-rays of both hands obtained today to assess for radiographic progression.  Plan: XR Hand 2 View Left, XR Hand 2 View Right  Pain in both feet - Chronic pain. Photos attached above.  Left ankle previous injury 20 years ago --recent exacerbation of pain.  Under care of podiatry--currently awaiting an  MRI to determine if she requires surgical intervention.  She has been wearing a compression sleeve for  support and has been taking meloxicam  7.5 mg twice daily for symptomatic relief.  She has tenderness of both ankle joints on examination today as well as tenderness along the plantar fascia bilaterally.  X-rays of both feet were obtained today for further evaluation.  Plan: XR Foot 2 Views Right, XR Foot 2 Views Left  Chronic pain of both knees -Chronic pain and stiffness.  Discomfort with range of motion of both knees.  Fullness noted in the left knee.  No effusion noted.  X-rays of both knees were obtained today for further evaluation.  Plan: XR KNEE 3 VIEW RIGHT, XR KNEE 3 VIEW LEFT  Plantar fasciitis - Bilateral.  Plan to update the following lab work today.  Plan: HLA-B27 antigen  Chronic hip pain (Secondary source of pain) (Left) -Chronic pain.  Good ROM with no groin pain currently.   Plan: HLA-B27 antigen  Long term (current) use of opiate analgesic -She takes Oxycodone  10 mg 2 times daily as needed and methadone as prescribed by pain management.   Chronic pain syndrome: Under care of pain management.   Chronic musculoskeletal pain: Under care of pain management.   Failed back surgical syndrome: Under care of pain management.   Lumbar central spinal stenosis (L4-5) - Chronic pain. Plan: HLA-B27 antigen  Lumbar facet syndrome (Bilateral) - Chronic pain.  Plan: HLA-B27 antigen  Other medical conditions are listed as follows:   Neurogenic pain: Patient is taking gabapentin  as prescribed.  Vitamin D  deficiency: She is taking vitamin D  50,000 units once monthly.   Adjustment disorder with depressed mood  CCC (chronic calculous cholecystitis)  History of diverticulosis  Prediabetes    Orders: Orders Placed This Encounter  Procedures   XR Hand 2 View Left   XR Hand 2 View Right   XR Foot 2 Views Right   XR Foot 2 Views Left   XR KNEE 3 VIEW RIGHT   XR KNEE 3 VIEW LEFT    Rheumatoid factor   Cyclic citrul peptide antibody, IgG   Sedimentation rate   C-reactive protein   Uric acid   HLA-B27 antigen   CBC with Differential/Platelet   Comprehensive metabolic panel with GFR   ANA   No orders of the defined types were placed in this encounter.     Follow-Up Instructions: Return for NPFU.   Romayne Clubs, PA-C  Note - This record has been created using Dragon software.  Chart creation errors have been sought, but may not always  have been located. Such creation errors do not reflect on  the standard of medical care.

## 2023-10-27 ENCOUNTER — Ambulatory Visit

## 2023-10-27 ENCOUNTER — Encounter: Payer: Self-pay | Admitting: Physician Assistant

## 2023-10-27 ENCOUNTER — Ambulatory Visit: Attending: Physician Assistant | Admitting: Physician Assistant

## 2023-10-27 VITALS — BP 121/84 | HR 80 | Resp 16 | Ht 66.0 in | Wt 172.4 lb

## 2023-10-27 DIAGNOSIS — R7303 Prediabetes: Secondary | ICD-10-CM

## 2023-10-27 DIAGNOSIS — M2550 Pain in unspecified joint: Secondary | ICD-10-CM

## 2023-10-27 DIAGNOSIS — E559 Vitamin D deficiency, unspecified: Secondary | ICD-10-CM

## 2023-10-27 DIAGNOSIS — G8929 Other chronic pain: Secondary | ICD-10-CM

## 2023-10-27 DIAGNOSIS — M722 Plantar fascial fibromatosis: Secondary | ICD-10-CM

## 2023-10-27 DIAGNOSIS — M25561 Pain in right knee: Secondary | ICD-10-CM

## 2023-10-27 DIAGNOSIS — M79642 Pain in left hand: Secondary | ICD-10-CM | POA: Diagnosis not present

## 2023-10-27 DIAGNOSIS — L409 Psoriasis, unspecified: Secondary | ICD-10-CM | POA: Diagnosis not present

## 2023-10-27 DIAGNOSIS — K8010 Calculus of gallbladder with chronic cholecystitis without obstruction: Secondary | ICD-10-CM

## 2023-10-27 DIAGNOSIS — M79672 Pain in left foot: Secondary | ICD-10-CM

## 2023-10-27 DIAGNOSIS — M792 Neuralgia and neuritis, unspecified: Secondary | ICD-10-CM

## 2023-10-27 DIAGNOSIS — M47816 Spondylosis without myelopathy or radiculopathy, lumbar region: Secondary | ICD-10-CM

## 2023-10-27 DIAGNOSIS — M25562 Pain in left knee: Secondary | ICD-10-CM | POA: Diagnosis not present

## 2023-10-27 DIAGNOSIS — M79641 Pain in right hand: Secondary | ICD-10-CM | POA: Diagnosis not present

## 2023-10-27 DIAGNOSIS — M79671 Pain in right foot: Secondary | ICD-10-CM

## 2023-10-27 DIAGNOSIS — R768 Other specified abnormal immunological findings in serum: Secondary | ICD-10-CM

## 2023-10-27 DIAGNOSIS — Z79891 Long term (current) use of opiate analgesic: Secondary | ICD-10-CM

## 2023-10-27 DIAGNOSIS — M961 Postlaminectomy syndrome, not elsewhere classified: Secondary | ICD-10-CM

## 2023-10-27 DIAGNOSIS — F4321 Adjustment disorder with depressed mood: Secondary | ICD-10-CM

## 2023-10-27 DIAGNOSIS — M7918 Myalgia, other site: Secondary | ICD-10-CM

## 2023-10-27 DIAGNOSIS — M25552 Pain in left hip: Secondary | ICD-10-CM

## 2023-10-27 DIAGNOSIS — G894 Chronic pain syndrome: Secondary | ICD-10-CM

## 2023-10-27 DIAGNOSIS — Z8719 Personal history of other diseases of the digestive system: Secondary | ICD-10-CM

## 2023-10-27 DIAGNOSIS — M48061 Spinal stenosis, lumbar region without neurogenic claudication: Secondary | ICD-10-CM

## 2023-10-28 LAB — CBC WITH DIFFERENTIAL/PLATELET
Absolute Lymphocytes: 2470 {cells}/uL (ref 850–3900)
Absolute Monocytes: 410 {cells}/uL (ref 200–950)
Basophils Absolute: 61 {cells}/uL (ref 0–200)
Basophils Relative: 0.8 %
Eosinophils Absolute: 220 {cells}/uL (ref 15–500)
Eosinophils Relative: 2.9 %
HCT: 42.5 % (ref 35.0–45.0)
Hemoglobin: 13.6 g/dL (ref 11.7–15.5)
MCH: 28.2 pg (ref 27.0–33.0)
MCHC: 32 g/dL (ref 32.0–36.0)
MCV: 88 fL (ref 80.0–100.0)
MPV: 10.9 fL (ref 7.5–12.5)
Monocytes Relative: 5.4 %
Neutro Abs: 4438 {cells}/uL (ref 1500–7800)
Neutrophils Relative %: 58.4 %
Platelets: 251 10*3/uL (ref 140–400)
RBC: 4.83 10*6/uL (ref 3.80–5.10)
RDW: 12.6 % (ref 11.0–15.0)
Total Lymphocyte: 32.5 %
WBC: 7.6 10*3/uL (ref 3.8–10.8)

## 2023-10-28 LAB — COMPREHENSIVE METABOLIC PANEL WITH GFR
AG Ratio: 2 (calc) (ref 1.0–2.5)
ALT: 10 U/L (ref 6–29)
AST: 15 U/L (ref 10–35)
Albumin: 4.5 g/dL (ref 3.6–5.1)
Alkaline phosphatase (APISO): 60 U/L (ref 37–153)
BUN: 19 mg/dL (ref 7–25)
CO2: 31 mmol/L (ref 20–32)
Calcium: 9.6 mg/dL (ref 8.6–10.4)
Chloride: 104 mmol/L (ref 98–110)
Creat: 1.02 mg/dL (ref 0.50–1.03)
Globulin: 2.2 g/dL (ref 1.9–3.7)
Glucose, Bld: 100 mg/dL — ABNORMAL HIGH (ref 65–99)
Potassium: 4.6 mmol/L (ref 3.5–5.3)
Sodium: 142 mmol/L (ref 135–146)
Total Bilirubin: 0.5 mg/dL (ref 0.2–1.2)
Total Protein: 6.7 g/dL (ref 6.1–8.1)
eGFR: 65 mL/min/{1.73_m2} (ref >=60)

## 2023-10-28 LAB — C-REACTIVE PROTEIN: CRP: <3 mg/L (ref <8.0)

## 2023-10-28 LAB — ANA: Anti Nuclear Antibody (ANA): NEGATIVE

## 2023-10-28 LAB — SEDIMENTATION RATE: Sed Rate: 14 mm/h (ref 0–30)

## 2023-10-28 LAB — HLA-B27 ANTIGEN: HLA-B27 Antigen: NEGATIVE

## 2023-10-28 LAB — URIC ACID: Uric Acid, Serum: 4 mg/dL (ref 2.5–7.0)

## 2023-10-28 LAB — CYCLIC CITRUL PEPTIDE ANTIBODY, IGG: Cyclic Citrullin Peptide Ab: <16 U

## 2023-10-28 LAB — RHEUMATOID FACTOR: Rheumatoid fact SerPl-aCnc: 20 [IU]/mL — ABNORMAL HIGH (ref <14)

## 2023-10-29 ENCOUNTER — Telehealth: Payer: Self-pay | Admitting: Rheumatology

## 2023-10-29 ENCOUNTER — Ambulatory Visit: Payer: Self-pay | Admitting: Physician Assistant

## 2023-10-29 NOTE — Progress Notes (Signed)
Results will be discussed at NPFU

## 2023-10-29 NOTE — Telephone Encounter (Signed)
 [facility] dermatology left a voicemail stating Dr. Burdette Carolin referred patient to our office and are requesting office notes be faxed to [phone].

## 2023-10-29 NOTE — Telephone Encounter (Signed)
 Notes faxed.

## 2023-11-13 NOTE — Progress Notes (Unsigned)
 Office Visit Note  Patient: [name]             Date of Birth: June 13, 1970           MRN: [id number]             PCP: Deedra Farr, MD Referring: Deedra Farr, MD Visit Date: 11/27/2023 Occupation: @GUAROCC @  Subjective:  Discuss results and treatment options   History of Present Illness: [name] is a 53 y.o. female with history of psoriasis and polyarthralgia.  Patient presents today with ongoing pain involving multiple joints.  Her symptoms have been most severe involving both feet, left greater than right.  She has had ongoing plantar fasciitis and is currently wearing a boot for support.  She has not yet proceeded with an MRI of the left foot for further evaluation.  She continues have intermittent discomfort in her hands and knee joints.  She continues to have psoriasis on the anterior aspect of both knees.  Patient is open to discuss treatment options due to the chronicity and severity of her symptoms. She has continued to take meloxicam  7.5 mg daily.    Activities of Daily Living:  Patient reports morning stiffness for 1-2 hours.   Patient Denies nocturnal pain.  Difficulty dressing/grooming: Reports Difficulty climbing stairs: Reports Difficulty getting out of chair: Denies Difficulty using hands for taps, buttons, cutlery, and/or writing: Reports  Review of Systems  Constitutional:  Positive for fatigue.  HENT:  Positive for mouth dryness. Negative for mouth sores.   Eyes:  Negative for dryness.  Respiratory:  Negative for shortness of breath.   Cardiovascular:  Negative for chest pain and palpitations.  Gastrointestinal:  Negative for blood in stool, constipation and diarrhea.  Endocrine: Negative for increased urination.  Genitourinary:  Negative for involuntary urination.  Musculoskeletal:  Positive for joint pain, joint pain, joint swelling, myalgias, muscle weakness, morning stiffness, muscle tenderness and myalgias. Negative for gait problem.  Skin:   Negative for color change, rash, hair loss and sensitivity to sunlight.  Allergic/Immunologic: Negative for susceptible to infections.  Neurological:  Negative for dizziness and headaches.  Hematological:  Negative for swollen glands.  Psychiatric/Behavioral:  Positive for depressed mood. Negative for sleep disturbance. The patient is not nervous/anxious.     PMFS History:  Patient Active Problem List   Diagnosis Date Noted   Acute non-recurrent frontal sinusitis 03/21/2022   Bilateral hand pain 02/19/2022   Rash 02/19/2022   Rectal polyp    Degeneration of lumbar intervertebral disc 01/17/2021   CCC (chronic calculous cholecystitis) 03/14/2020   Cyst of right kidney 01/08/2018   Tobacco use 08/25/2017   Preventative health care 08/25/2017   Trigger point with back pain (Left) 07/21/2017   Mood disorder (HCC) 01/30/2017   Neurogenic pain 01/14/2017   Lumbar facet syndrome (Bilateral) 12/18/2016   Lumbar spondylosis 12/18/2016   Chronic musculoskeletal pain 12/17/2016   Chronic pain syndrome 12/05/2016   Long term (current) use of opiate analgesic 12/05/2016   Long term prescription opiate use 12/05/2016   Opiate use (85.5 MME/Day) 12/05/2016   Chronic hip pain (Secondary source of pain) (Left) 12/05/2016   Failed back surgical syndrome 12/05/2016   Discogenic low back pain 12/05/2016   Lumbar discogenic pain syndrome 12/05/2016   Chronic sacroiliac joint pain (Left) 12/05/2016   Elevated rheumatoid factor 10/22/2016   Chronic, continuous use of opioids 10/03/2016   Polyarthralgia 10/02/2016   Low back pain 07/22/2016   Lumbosacral spondylosis without myelopathy  03/12/2015   Lumbar central spinal stenosis (L4-5) 03/12/2015    Class: History of   Adjustment disorder with depressed mood 03/10/2015   Stressful life events affecting family and household 03/10/2015   Vitamin D  deficiency 03/10/2015    Past Medical History:  Diagnosis Date   Allergy    Anxiety    Arthritis     Chronic bilateral low back pain 07/22/2016   Started in her 20's; managed by pain clinic   Depression    Kidney cysts 12/21/2017   Rheumatoid arthritis (HCC)     Family History  Problem Relation Age of Onset   Hypertension Mother    Diabetes Mother    Gallbladder disease Mother    Hypertension Father    Hyperlipidemia Father    Arthritis Father    Lung disease Father    Heart disease Father    Lymphoma Father    Depression Sister    Depression Sister    Healthy Brother    Heart disease Maternal Grandfather    Cancer Maternal Aunt        breast   Breast cancer Maternal Aunt 34   Stroke Neg Hx    Past Surgical History:  Procedure Laterality Date   ABDOMINAL HYSTERECTOMY     BACK SURGERY     two back sx one in [place] and one at [facility]   BREAST CYST ASPIRATION     CESAREAN SECTION     COLONOSCOPY WITH PROPOFOL  N/A 01/22/2021   Procedure: COLONOSCOPY WITH BIOPSY;  Surgeon: Marnee Sink, MD;  Location: [facility];  Service: Endoscopy;  Laterality: N/A;   POLYPECTOMY N/A 01/22/2021   Procedure: POLYPECTOMY;  Surgeon: Marnee Sink, MD;  Location: [facility];  Service: Endoscopy;  Laterality: N/A;   SPINE SURGERY     SPINE SURGERY  09/09/2015   TONSILLECTOMY     Social History   Social History Narrative   2 grown children   Immunization History  Administered Date(s) Administered   Influenza,inj,Quad PF,6+ Mos 08/02/2015, 07/22/2016, 03/17/2018, 02/19/2022   Influenza-Unspecified 03/11/2014   PFIZER(Purple Top)SARS-COV-2 Vaccination 09/08/2019   Tdap 03/11/2014     Objective: Vital Signs: BP 111/74 (BP Location: Left Arm, Patient Position: Sitting, Cuff Size: Normal)   Pulse 67   Resp 16   Ht 5' 6 (1.676 m)   Wt 173 lb (78.5 kg)   BMI 27.92 kg/m    Physical Exam Vitals and nursing note reviewed.  Constitutional:      Appearance: She is well-developed.  HENT:     Head: Normocephalic and atraumatic.   Eyes:     Conjunctiva/sclera:  Conjunctivae normal.    Cardiovascular:     Rate and Rhythm: Normal rate and regular rhythm.     Heart sounds: Normal heart sounds.  Pulmonary:     Effort: Pulmonary effort is normal.     Breath sounds: Normal breath sounds.  Abdominal:     General: Bowel sounds are normal.     Palpations: Abdomen is soft.   Musculoskeletal:     Cervical back: Normal range of motion.   Skin:    General: Skin is warm and dry.     Capillary Refill: Capillary refill takes less than 2 seconds.     Comments: Patches of plaque psoriasis on the anterior aspect of both knees   Neurological:     Mental Status: She is alert and oriented to person, place, and time.   Psychiatric:        Behavior: Behavior  normal.      Musculoskeletal Exam: C-spine has good range of motion.  Shoulder joints have good range of motion.  Elbow joints, wrist joints, MCPs have good range of motion with no synovitis.  PIP and DIP thickening.  Hip joints have good range of motion with no groin pain.  Knee joints have good range of motion with some discomfort bilaterally.  Fullness in the left knee noted.  Tenderness of both ankle joints.  Plantar fasciitis of both feet.  CDAI Exam: CDAI Score: -- Patient Global: --; Provider Global: -- Swollen: --; Tender: -- Joint Exam 11/27/2023   No joint exam has been documented for this visit   There is currently no information documented on the homunculus. Go to the Rheumatology activity and complete the homunculus joint exam.  Investigation: No additional findings.  Imaging: No results found.   Recent Labs: Lab Results  Component Value Date   WBC 7.6 10/27/2023   HGB 13.6 10/27/2023   PLT 251 10/27/2023   NA 142 10/27/2023   K 4.6 10/27/2023   CL 104 10/27/2023   CO2 31 10/27/2023   GLUCOSE 100 (H) 10/27/2023   BUN 19 10/27/2023   CREATININE 1.02 10/27/2023   BILITOT 0.5 10/27/2023   ALKPHOS 62 02/19/2022   AST 15 10/27/2023   ALT 10 10/27/2023   PROT 6.7  10/27/2023   ALBUMIN 4.3 02/19/2022   CALCIUM 9.6 10/27/2023   GFRAA 92 02/10/2020    Speciality Comments: No specialty comments available.  Procedures:  No procedures performed Allergies: Codeine        Assessment / Plan:     Visit Diagnoses: Psoriatic arthritis (HCC): +Psoriasis-IR to topical agents, arthralgias x2 years, inflammation in left knee, recurrent plantar fasciitis, IR to meloxicam  and natural antiinflammatories, morning stiffness, nocturnal pain, HLA-B27 negative: Patient presented today to discuss x-[place] and lab results from her initial office visit.  She continues to have significant pain and stiffness involving multiple joints.  She is currently wearing a boot on the left foot due to severity of pain involving the plantar fascia.  She has ongoing psoriasis on the anterior aspect of both knees.  She has been taking meloxicam  7.5 mg daily as well as oxycodone  and methadone as prescribed by pain management. Different treatment options were discussed today in detail.  Discussed that I would not recommend the use of methotrexate since she is on meloxicam  long-term. Discussed the option of initiating a trial of Otezla 30 mg 1 tablet twice daily if approved by insurance.  She was in agreement.  Indications, contraindications, potential side effects of Otezla were discussed today in detail.  All questions were addressed.  Consent was obtained.  Patient was provided a starter pack of Otezla today.  Psoriasis: Dx by dermatology 2023-Dr. Burdette Carolin. Tried several topical agents-betamethasone , triamcinolone , and Fluocinonide 0.05% cream BID PRN:  Patient currently has plaque psoriasis on the anterior aspect of both knees.  She is not currently using any topical agents due to inadequate response-these patches have never cleared with topical agents. Plan to initiate Otezla as discussed above.  Counseled patient that Otezla is a PDE 4 inhibitor that works to treat psoriasis and the joint pain and  tenderness of psoriatic arthritis.  Counseled patient on purpose, proper use, and adverse effects of Otezla.  Reviewed the most common adverse effects of weight loss, depression, nausea/diarrhea/vomiting, headaches, and nasal congestion.  Advised patient to notify office of any serious changes in mood and/or thoughts of suicide.  Provided patient with  medication education material and answered all questions.  Patient consented to Otezla.    Patient dose will be Otezla starter titration pack and then 30 mg twice daily.  Prescription pending insurance approval and once approved patient may pick up sample for starter pack from our office.  High risk medication use: Plan to initiate Otezla--maintenance dose of 30 mg BID-pending insurance approval.   CBC and CMP were drawn on 10/27/2023.  Polyarthralgia - Psoriasis, polyarthralgia, plantar fasciitis bilaterally, morning stiffness, nocturnal pain: HLA-B27 negative.  Plan to initiate Otezla as discussed above.  Elevated rheumatoid factor - RF was 15 on 03/17/2018.  RF was 23 on 10/02/2016.  No known family history of rheumatoid arthritis.  RF 20 and anti-CCP negative on 10/27/2023.  She does not currently have any clinical features of rheumatoid arthritis.  No tenderness or synovitis over MCP joints.  Bilateral hand pain - 02/19/22: Mild degenerative changes in interphalangeal joints bilaterally, right > left.  X-rays of both hands were consistent with osteoarthritis on 10/27/2023.  No erosive changes were noted.  Chronic hip pain (Secondary source of pain) (Left): Chronic pain.  Primary osteoarthritis of both knees: X-rays of both knees were consistent with mild osteoarthritis.  No chondrocalcinosis noted.  She continues to have intermittent discomfort in both knees and has inflammation in the left knee.  Plan to initiate Otezla as discussed above.  Primary osteoarthritis of both feet: Chronic pain.  X-rays of both feet were consistent with osteoarthritis.  No  erosive changes were noted.  She continues to have persistent discomfort especially in the left foot.  She has plantar fasciitis bilaterally.  She is currently wearing a boot on the left lower extremity due to severity of symptoms.  Long term (current) use of opiate analgesic - -She takes Oxycodone  10 mg 2 times daily as needed and methadone as prescribed by pain management.  Chronic pain syndrome: Under the care of pain management.  Chronic musculoskeletal pain: Under the care of pain management.  Failed back surgical syndrome: Under the care of pain management.  Lumbar central spinal stenosis (L4-5): HLA-B27 negative.  Lumbar facet syndrome (Bilateral): HLA-B27 negative.  Neurogenic pain  Other medical conditions are listed as follows:   Vitamin D  deficiency  Adjustment disorder with depressed mood  CCC (chronic calculous cholecystitis)  History of diverticulosis  Orders: No orders of the defined types were placed in this encounter.  No orders of the defined types were placed in this encounter.    Follow-Up Instructions: Return in about 8 weeks (around 01/22/2024).   Romayne Clubs, PA-C  Note - This record has been created using Dragon software.  Chart creation errors have been sought, but may not always  have been located. Such creation errors do not reflect on  the standard of medical care.

## 2023-11-27 ENCOUNTER — Encounter: Payer: Self-pay | Admitting: Physician Assistant

## 2023-11-27 ENCOUNTER — Ambulatory Visit: Attending: Physician Assistant | Admitting: Physician Assistant

## 2023-11-27 ENCOUNTER — Telehealth: Payer: Self-pay

## 2023-11-27 VITALS — BP 111/74 | HR 67 | Resp 16 | Ht 66.0 in | Wt 173.0 lb

## 2023-11-27 DIAGNOSIS — M7918 Myalgia, other site: Secondary | ICD-10-CM

## 2023-11-27 DIAGNOSIS — M19071 Primary osteoarthritis, right ankle and foot: Secondary | ICD-10-CM

## 2023-11-27 DIAGNOSIS — K8010 Calculus of gallbladder with chronic cholecystitis without obstruction: Secondary | ICD-10-CM

## 2023-11-27 DIAGNOSIS — L409 Psoriasis, unspecified: Secondary | ICD-10-CM

## 2023-11-27 DIAGNOSIS — R768 Other specified abnormal immunological findings in serum: Secondary | ICD-10-CM

## 2023-11-27 DIAGNOSIS — Z111 Encounter for screening for respiratory tuberculosis: Secondary | ICD-10-CM

## 2023-11-27 DIAGNOSIS — M48061 Spinal stenosis, lumbar region without neurogenic claudication: Secondary | ICD-10-CM

## 2023-11-27 DIAGNOSIS — Z8719 Personal history of other diseases of the digestive system: Secondary | ICD-10-CM

## 2023-11-27 DIAGNOSIS — L4050 Arthropathic psoriasis, unspecified: Secondary | ICD-10-CM

## 2023-11-27 DIAGNOSIS — Z1159 Encounter for screening for other viral diseases: Secondary | ICD-10-CM

## 2023-11-27 DIAGNOSIS — Z79899 Other long term (current) drug therapy: Secondary | ICD-10-CM

## 2023-11-27 DIAGNOSIS — Z114 Encounter for screening for human immunodeficiency virus [HIV]: Secondary | ICD-10-CM

## 2023-11-27 DIAGNOSIS — F4321 Adjustment disorder with depressed mood: Secondary | ICD-10-CM

## 2023-11-27 DIAGNOSIS — E559 Vitamin D deficiency, unspecified: Secondary | ICD-10-CM

## 2023-11-27 DIAGNOSIS — M170 Bilateral primary osteoarthritis of knee: Secondary | ICD-10-CM

## 2023-11-27 DIAGNOSIS — M19072 Primary osteoarthritis, left ankle and foot: Secondary | ICD-10-CM

## 2023-11-27 DIAGNOSIS — M79642 Pain in left hand: Secondary | ICD-10-CM

## 2023-11-27 DIAGNOSIS — M2550 Pain in unspecified joint: Secondary | ICD-10-CM

## 2023-11-27 DIAGNOSIS — Z79891 Long term (current) use of opiate analgesic: Secondary | ICD-10-CM

## 2023-11-27 DIAGNOSIS — M25552 Pain in left hip: Secondary | ICD-10-CM

## 2023-11-27 DIAGNOSIS — M792 Neuralgia and neuritis, unspecified: Secondary | ICD-10-CM

## 2023-11-27 DIAGNOSIS — M79641 Pain in right hand: Secondary | ICD-10-CM

## 2023-11-27 DIAGNOSIS — M961 Postlaminectomy syndrome, not elsewhere classified: Secondary | ICD-10-CM

## 2023-11-27 DIAGNOSIS — M47816 Spondylosis without myelopathy or radiculopathy, lumbar region: Secondary | ICD-10-CM

## 2023-11-27 DIAGNOSIS — G8929 Other chronic pain: Secondary | ICD-10-CM

## 2023-11-27 DIAGNOSIS — G894 Chronic pain syndrome: Secondary | ICD-10-CM

## 2023-11-27 NOTE — Telephone Encounter (Signed)
 Thais Fill, RPH-CPP  P Rx Rheum/Pulm Please start Otezla BIV (for maintenance). Provided starter pack in office today (which she has been advised to start AFTER Otezla is approved  Submitted a Prior Authorization request to [employer] for OTEZLA via CoverMyMeds. Will update once we receive a response.  Key: [id number]

## 2023-11-27 NOTE — Progress Notes (Signed)
 Pharmacy Note  Subjective:  Patient presents today to [facility] for follow up office visit.   Patient was seen by the pharmacist for counseling on Otezla for psoriatic arthritis and plaque psoriasis. She is on meloxicam . Is naive to treatment.   Has history of clinical depression but is not taking pharmacotherapy currently. She states that her uncontrolled pain does contribute to her depression  Objective: CMP     Component Value Date/Time   NA 142 10/27/2023 1128   NA 139 12/05/2016 1122   NA 140 04/16/2013 0845   K 4.6 10/27/2023 1128   K 3.6 04/16/2013 0845   CL 104 10/27/2023 1128   CL 107 04/16/2013 0845   CO2 31 10/27/2023 1128   CO2 27 04/16/2013 0845   GLUCOSE 100 (H) 10/27/2023 1128   GLUCOSE 139 (H) 04/16/2013 0845   BUN 19 10/27/2023 1128   BUN 17 12/05/2016 1122   BUN 23 (H) 04/16/2013 0845   CREATININE 1.02 10/27/2023 1128   CALCIUM 9.6 10/27/2023 1128   CALCIUM 8.6 04/16/2013 0845   PROT 6.7 10/27/2023 1128   PROT 6.6 12/05/2016 1122   PROT 6.6 04/16/2013 0845   ALBUMIN 4.3 02/19/2022 1044   ALBUMIN 4.4 12/05/2016 1122   ALBUMIN 3.5 04/16/2013 0845   AST 15 10/27/2023 1128   AST 21 04/16/2013 0845   ALT 10 10/27/2023 1128   ALT 21 04/16/2013 0845   ALKPHOS 62 02/19/2022 1044   ALKPHOS 61 04/16/2013 0845   BILITOT 0.5 10/27/2023 1128   BILITOT 0.4 12/05/2016 1122   BILITOT 0.2 04/16/2013 0845   GFRNONAA >60 04/12/2020 1142   GFRNONAA 79 02/10/2020 1013   GFRAA 92 02/10/2020 1013    CBC    Component Value Date/Time   WBC 7.6 10/27/2023 1128   RBC 4.83 10/27/2023 1128   HGB 13.6 10/27/2023 1128   HGB 13.7 09/18/2016 0903   HCT 42.5 10/27/2023 1128   HCT 40.7 09/18/2016 0903   PLT 251 10/27/2023 1128   PLT 268 09/18/2016 0903   MCV 88.0 10/27/2023 1128   MCV 88 09/18/2016 0903   MCV 89 04/16/2013 0845   MCH 28.2 10/27/2023 1128   MCHC 32.0 10/27/2023 1128   RDW 12.6 10/27/2023 1128   RDW 15.1 09/18/2016 0903   RDW 12.7  04/16/2013 0845   LYMPHSABS 3.3 04/12/2020 1142   LYMPHSABS 4.5 (H) 09/18/2016 0903   MONOABS 0.5 04/12/2020 1142   EOSABS 220 10/27/2023 1128   EOSABS 0.2 09/18/2016 0903   BASOSABS 61 10/27/2023 1128   BASOSABS 0.0 09/18/2016 0903     Assessment/Plan:  Patient unable to take methotrexate and leflunomide due to regular meloxicam  and Tylenol  use (risk for nephrotoxicity and hepatotoxicity). She has plantar fascitis and active disease requiring treatment  Counseled patient that Otezla is a PDE 4 inhibitor that works to treat psoriasis and the joint pain and tenderness of psoriatic arthritis.  Counseled patient on purpose, proper use, and adverse effects of Otezla.  Reviewed the most common adverse effects of weight loss, depression, nausea/diarrhea/vomiting, headaches, and nasal congestion.  Advised patient to notify office of any serious changes in mood and/or thoughts of suicide. Patient has history of clinical depression that is not currently being treated with pharmacotherapy. She attributes depressive symptoms with pain as well. She is aware to seek emergency services with suicidal thoughts or ideation. She is aware to notify our office if depressive symptoms or PCP if interested in starting pharmacotherapy for depression  Provided patient with medication  education material and answered all questions.  Patient consented to Otezla.  Will apply for Otezla through patient's insurance and update when we receive a response.  Patient dose will be Otezla starter titration pack and then 30 mg twice daily.  Prescription pending insurance approval.  Patient verbalized understanding.   Patient provided starter pack in office today which she will hold on starting until Otezla is approved through insurance  Medication Samples have been provided to the patient.  Drug name: Otezla starter pack (55 tabs is 14 day supply) LOT: [id number] Exp.Date: 02/08/2024  Dosing instructions: Initial: 10 mg in the  morning on day 1. Titrate upward by additional 10 mg per day on days 2 to 5 as follows: Day 2: 10 mg twice daily; Day 3: 10 mg in the morning and 20 mg in the evening; Day 4: 20 mg twice daily; Day 5: 20 mg in the morning and 30 mg in the evening. Maintenance dose: 30 mg twice daily starting on day 6.  Geraldene Kleine, PharmD, MPH, BCPS, CPP Clinical Pharmacist (Rheumatology and Pulmonology)

## 2023-11-27 NOTE — Patient Instructions (Signed)
 Apremilast Tablets What is this medication? APREMILAST (a PRE mil ast) treats autoimmune conditions, such as arthritis and psoriasis. It may also be used to treat mouth ulcers in people with a condition that causes blood vessel swelling (Behcet syndrome). It works by decreasing inflammation. This medicine may be used for other purposes; ask your health care provider or pharmacist if you have questions. COMMON BRAND NAME(S): [place] What should I tell my care team before I take this medication? They need to know if you have any of these conditions: Dehydration Depression Kidney disease Suicidal thoughts, plans, or attempt An unusual or allergic reaction to apremilast, other medications, foods, dyes, or preservatives Pregnant or trying to get pregnant Breastfeeding How should I use this medication? Take this medication by mouth with water. Take it as directed on the prescription label at the same time every day. Do not cut, crush, or chew this medication. Swallow the tablets whole. You can take it with or without food. If it upsets your stomach, take it with food. Keep taking it unless your care team tells you to stop. Talk to your care team about the use of this medication in children. While it may be prescribed for children as young as 6 years for selected conditions, precautions do apply. Overdosage: If you think you have taken too much of this medicine contact a poison control center or emergency room at once. NOTE: This medicine is only for you. Do not share this medicine with others. What if I miss a dose? If you miss a dose, take it as soon as you can. If it is almost time for your next dose, take only that dose. Do not take double or extra doses. What may interact with this medication? Certain medications for seizures, such as carbamazepine, phenobarbital, phenytoin Rifampin Other medications may affect the way this medication works. Talk with your care team about all of the medications  you take. They may suggest changes to your treatment plan to lower the risk of side effects and to make sure your medications work as intended. This list may not describe all possible interactions. Give your health care provider a list of all the medicines, herbs, non-prescription drugs, or dietary supplements you use. Also tell them if you smoke, drink alcohol, or use illegal drugs. Some items may interact with your medicine. What should I watch for while using this medication? Visit your care team for regular checks on your progress. Tell your care team if your symptoms do not start to get better or if they get worse. This medication may cause thoughts of suicide or depression. This includes sudden changes in mood, behaviors, or thoughts. These changes can happen at any time but are more common in the beginning of treatment or after a change in dose. Call your care team right away if you experience these thoughts or worsening depression. Check with your care team if you have severe diarrhea, nausea, and vomiting, or if you sweat a lot. The loss of too much body fluid may make it dangerous for you to take this medication. Discuss the medication with your care team if you may be pregnant. There are benefits and risks to taking medications during pregnancy. Your care team can help you find the option that works for you. Talk to your care team before breastfeeding. Changes to your treatment plan may be needed. What side effects may I notice from receiving this medication? Side effects that you should report to your care team as soon as  possible: Allergic reactions--skin rash, itching, hives, swelling of the face, lips, tongue, or throat Thoughts of suicide or self-harm, worsening mood, feelings of depression Side effects that usually do not require medical attention (report these to your care team if they continue or are bothersome): Diarrhea Headache Loss of appetite with weight  loss Nausea Vomiting This list may not describe all possible side effects. Call your doctor for medical advice about side effects. You may report side effects to FDA at 1-800-FDA-1088. Where should I keep my medication? Keep out of the reach of children and pets. Store below 30 degrees C (86 degrees F). Get rid of any unused medication after the expiration date. To get rid of medications that are no longer needed or have expired: Take the medication to a medication take-back program. Check with your pharmacy or law enforcement to find a location. If you cannot return the medication, check the label or package insert to see if the medication should be thrown out in the garbage or flushed down the toilet. If you are not sure, ask your care team. If it is safe to put it in the trash, take the medication out of the container. Mix the medication with cat litter, dirt, coffee grounds, or other unwanted substance. Seal the mixture in a bag or container. Put it in the trash. NOTE: This sheet is a summary. It may not cover all possible information. If you have questions about this medicine, talk to your doctor, pharmacist, or health care provider.  2024 Elsevier/Gold Standard (2023-05-09 00:00:00)

## 2023-11-28 ENCOUNTER — Telehealth: Payer: Self-pay

## 2023-11-28 NOTE — Telephone Encounter (Signed)
 Received a fax regarding Prior Authorization from [employer] for OTEZLA. Authorization has been DENIED because:  Pt must have previously tried and failed a conventional DMARD (such as sulfasalazine) Pt must try the following medications: Adalimumab-fkjp, Hadlima, Humira, Rinvoq, Skyrizi, Taltz, Xeljanz, Xeljanz XR  Peer to Peer phone#: [phone]  Denial letter sent to scan center for reference.

## 2023-11-28 NOTE — Telephone Encounter (Signed)
 I notified the patient and discussed options.   Please try to apply for skyrizi.  Devki--Consent can be obtained at new start visit.  Andrea--patient will need baseline immunosuppressive lab work prior to initiating skyrizi. She plans on going to [facility] Monday--please place orders for TB gold, HIV, Hepatitis panel, immunoglobulins, and SPEP.

## 2023-11-28 NOTE — Telephone Encounter (Signed)
 Submitted a Prior Authorization request to [employer] for SKYRIZI SQ via CoverMyMeds. Will update once we receive a response.  Key: [id number]

## 2023-11-28 NOTE — Telephone Encounter (Signed)
 [facility] orders placed: Orders Placed This Encounter  Procedures   Hepatitis B core antibody, IgM   Hepatitis B surface antigen   HIV Antibody (routine testing w rflx)   Hepatitis C antibody   QuantiFERON-TB Gold Plus   Serum protein electrophoresis with reflex   IgG, IgA, IgM    Pharmacy team will start Skyrizi BIV  Geraldene Kleine, PharmD, MPH, BCPS, CPP Clinical Pharmacist (Rheumatology and Pulmonology)

## 2023-12-01 ENCOUNTER — Other Ambulatory Visit (HOSPITAL_COMMUNITY): Payer: Self-pay

## 2023-12-01 NOTE — Telephone Encounter (Signed)
 Received notification from [employer] regarding a prior authorization for SKYRIZI SQ. Authorization has been APPROVED from 11/28/2023 to 11/27/2024. Approval letter sent to scan center.  Per test claim, copay for 28 days supply is $150 and for 84 days supply is $450  Patient can fill through [facility]: [phone]   Phone # [phone]  Enrolled patient into [facility] copay card via [employer] portal: ID : [id number] Group: [id number] BIN: [id number] PCN: [id number] Suf: 01 Issued: 12/01/2023  New start is pending all immunosuppressive labs to result  Sherry Pennant, PharmD, MPH, BCPS, CPP Clinical Pharmacist (Rheumatology and Pulmonology)

## 2023-12-04 NOTE — Telephone Encounter (Signed)
 Patient contacted the office and states she plans to get her labs done after work today. Advised the patient her lab orders have already been released. Patient states she would like to start her medication soon because she is not doing good. Advised the patient we would have to wait until the labs come back and the doctor would make that decision. Patient verbalized understanding.

## 2023-12-05 ENCOUNTER — Other Ambulatory Visit: Payer: Self-pay | Admitting: *Deleted

## 2023-12-05 DIAGNOSIS — Z79899 Other long term (current) drug therapy: Secondary | ICD-10-CM

## 2023-12-05 DIAGNOSIS — Z114 Encounter for screening for human immunodeficiency virus [HIV]: Secondary | ICD-10-CM

## 2023-12-05 DIAGNOSIS — L409 Psoriasis, unspecified: Secondary | ICD-10-CM

## 2023-12-05 DIAGNOSIS — L4050 Arthropathic psoriasis, unspecified: Secondary | ICD-10-CM

## 2023-12-05 DIAGNOSIS — Z9225 Personal history of immunosupression therapy: Secondary | ICD-10-CM

## 2023-12-05 DIAGNOSIS — Z1159 Encounter for screening for other viral diseases: Secondary | ICD-10-CM

## 2023-12-05 DIAGNOSIS — Z111 Encounter for screening for respiratory tuberculosis: Secondary | ICD-10-CM

## 2023-12-09 ENCOUNTER — Telehealth: Payer: Self-pay | Admitting: *Deleted

## 2023-12-09 NOTE — Progress Notes (Unsigned)
 Office Visit Note  Patient: [name]             Date of Birth: 02/11/1971           MRN: [id number]             PCP: Tatiana Delon GRADE, MD Referring: Tatiana Delon GRADE, MD Visit Date: 12/16/2023 Occupation: @GUAROCC @  Subjective:  Discuss treatment options   History of Present Illness: [name] is a 53 y.o. female with history of psoriatic arthritis.  Patient presents today to discuss treatment options. The use of otezla was denied by insurance.     CBC and CMP updated on 10/27/23.  G6PD updated today.     Medication counseling:  Baseline Immunosuppressant Labs  Hepatitis Panel    Latest Ref Rng & Units 01/17/2021    1:52 PM  Hepatitis  Hep C Ab NON-REACTIVE NON-REACTIVE    HIV Lab Results  Component Value Date   HIV NON-REACTIVE 01/17/2021   SPEP    Latest Ref Rng & Units 10/27/2023   11:28 AM  Serum Protein Electrophoresis  Total Protein 6.1 - 8.1 g/dL 6.7    H3EI:Ezwipwh  Chest x-Maroney: 06/09/19   Does the patient have an allergy to sulfa drugs? No  Patient was counseled on the purpose, proper use, and adverse effects of sulfasalazine including risk of infection and chance of nausea, headache, and sun sensitivity.  Also discussed risk of skin rash and advised patient to stop the medication and let us  know if she develops a rash. Also discussed for the potential of discoloration of the urine, sweat, or tears.  Advised patient to avoid live vaccines.  Recommend annual influenza, Pneumovax 23, Prevnar 13, and Shingrix as indicated.   Reviewed the importance of frequent labs to monitor liver, kidneys, and blood counts.  Standing orders placed and patient to return 1 month after starting therapy and then every 3 months.  Provided patient with educational materials on sulfasalazine and answered all questions.  Patient consented to sulfasalazine use, and consent will be uploaded into the media tab.    Patient dose will be ***.  Prescription will be sent to pharmacy  pending lab results and insurance approval.   Activities of Daily Living:  Patient reports morning stiffness for *** {minute/hour:19697}.   Patient {ACTIONS;DENIES/REPORTS:21021675::Denies} nocturnal pain.  Difficulty dressing/grooming: {ACTIONS;DENIES/REPORTS:21021675::Denies} Difficulty climbing stairs: {ACTIONS;DENIES/REPORTS:21021675::Denies} Difficulty getting out of chair: {ACTIONS;DENIES/REPORTS:21021675::Denies} Difficulty using hands for taps, buttons, cutlery, and/or writing: {ACTIONS;DENIES/REPORTS:21021675::Denies}  No Rheumatology ROS completed.   PMFS History:  Patient Active Problem List   Diagnosis Date Noted   Acute non-recurrent frontal sinusitis 03/21/2022   Bilateral hand pain 02/19/2022   Rash 02/19/2022   Rectal polyp    Degeneration of lumbar intervertebral disc 01/17/2021   CCC (chronic calculous cholecystitis) 03/14/2020   Cyst of right kidney 01/08/2018   Tobacco use 08/25/2017   Preventative health care 08/25/2017   Trigger point with back pain (Left) 07/21/2017   Mood disorder (HCC) 01/30/2017   Neurogenic pain 01/14/2017   Lumbar facet syndrome (Bilateral) 12/18/2016   Lumbar spondylosis 12/18/2016   Chronic musculoskeletal pain 12/17/2016   Chronic pain syndrome 12/05/2016   Long term (current) use of opiate analgesic 12/05/2016   Long term prescription opiate use 12/05/2016   Opiate use (85.5 MME/Day) 12/05/2016   Chronic hip pain (Secondary source of pain) (Left) 12/05/2016   Failed back surgical syndrome 12/05/2016   Discogenic low back pain 12/05/2016   Lumbar discogenic pain syndrome 12/05/2016  Chronic sacroiliac joint pain (Left) 12/05/2016   Elevated rheumatoid factor 10/22/2016   Chronic, continuous use of opioids 10/03/2016   Polyarthralgia 10/02/2016   Low back pain 07/22/2016   Lumbosacral spondylosis without myelopathy 03/12/2015   Lumbar central spinal stenosis (L4-5) 03/12/2015    Class: History of   Adjustment  disorder with depressed mood 03/10/2015   Stressful life events affecting family and household 03/10/2015   Vitamin D  deficiency 03/10/2015    Past Medical History:  Diagnosis Date   Allergy    Anxiety    Arthritis    Chronic bilateral low back pain 07/22/2016   Started in her 20's; managed by pain clinic   Depression    Kidney cysts 12/21/2017   Rheumatoid arthritis (HCC)     Family History  Problem Relation Age of Onset   Hypertension Mother    Diabetes Mother    Gallbladder disease Mother    Hypertension Father    Hyperlipidemia Father    Arthritis Father    Lung disease Father    Heart disease Father    Lymphoma Father    Depression Sister    Depression Sister    Healthy Brother    Heart disease Maternal Grandfather    Cancer Maternal Aunt        breast   Breast cancer Maternal Aunt 34   Stroke Neg Hx    Past Surgical History:  Procedure Laterality Date   ABDOMINAL HYSTERECTOMY     BACK SURGERY     two back sx one in [place] and one at [facility]   BREAST CYST ASPIRATION     CESAREAN SECTION     COLONOSCOPY WITH PROPOFOL  N/A 01/22/2021   Procedure: COLONOSCOPY WITH BIOPSY;  Surgeon: Jinny Carmine, MD;  Location: [facility];  Service: Endoscopy;  Laterality: N/A;   POLYPECTOMY N/A 01/22/2021   Procedure: POLYPECTOMY;  Surgeon: Jinny Carmine, MD;  Location: [facility];  Service: Endoscopy;  Laterality: N/A;   SPINE SURGERY     SPINE SURGERY  09/09/2015   TONSILLECTOMY     Social History   Social History Narrative   2 grown children   Immunization History  Administered Date(s) Administered   Influenza,inj,Quad PF,6+ Mos 08/02/2015, 07/22/2016, 03/17/2018, 02/19/2022   Influenza-Unspecified 03/11/2014   PFIZER(Purple Top)SARS-COV-2 Vaccination 09/08/2019   Tdap 03/11/2014     Objective: Vital Signs: There were no vitals taken for this visit.   Physical Exam Vitals and nursing note reviewed.  Constitutional:      Appearance: She is  well-developed.  HENT:     Head: Normocephalic and atraumatic.  Eyes:     Conjunctiva/sclera: Conjunctivae normal.  Cardiovascular:     Rate and Rhythm: Normal rate and regular rhythm.     Heart sounds: Normal heart sounds.  Pulmonary:     Effort: Pulmonary effort is normal.     Breath sounds: Normal breath sounds.  Abdominal:     General: Bowel sounds are normal.     Palpations: Abdomen is soft.  Musculoskeletal:     Cervical back: Normal range of motion.  Lymphadenopathy:     Cervical: No cervical adenopathy.  Skin:    General: Skin is warm and dry.     Capillary Refill: Capillary refill takes less than 2 seconds.  Neurological:     Mental Status: She is alert and oriented to person, place, and time.  Psychiatric:        Behavior: Behavior normal.      Musculoskeletal Exam: ***  CDAI Exam: CDAI Score: -- Patient Global: --; Provider Global: -- Swollen: --; Tender: -- Joint Exam 12/16/2023   No joint exam has been documented for this visit   There is currently no information documented on the homunculus. Go to the Rheumatology activity and complete the homunculus joint exam.  Investigation: No additional findings.  Imaging: No results found.  Recent Labs: Lab Results  Component Value Date   WBC 7.6 10/27/2023   HGB 13.6 10/27/2023   PLT 251 10/27/2023   NA 142 10/27/2023   K 4.6 10/27/2023   CL 104 10/27/2023   CO2 31 10/27/2023   GLUCOSE 100 (H) 10/27/2023   BUN 19 10/27/2023   CREATININE 1.02 10/27/2023   BILITOT 0.5 10/27/2023   ALKPHOS 62 02/19/2022   AST 15 10/27/2023   ALT 10 10/27/2023   PROT 6.7 10/27/2023   ALBUMIN 4.3 02/19/2022   CALCIUM 9.6 10/27/2023   GFRAA 92 02/10/2020    Speciality Comments: No specialty comments available.  Procedures:  No procedures performed Allergies: Codeine   Assessment / Plan:     Visit Diagnoses: Psoriatic arthritis (HCC) - +Psoriasis-IRtopical agents, arthralgias x2 years, inflammation in left  knee, recurrent plantar fasciitis, IR to meloxicam ,AM stiffness, nocturnal pain,HLA-B27-  Psoriasis - : Dx by dermatology 2023-Dr. Luke. Tried several topical agents-betamethasone , triamcinolone , and Fluocinonide 0.05% cream BID PRN:  High risk medication use - Otezla denied.  Elevated rheumatoid factor - RF was 15 on 03/17/2018.  RF was 23 on 10/02/2016.  No known family history of rheumatoid arthritis.  RF 20 and anti-CCP negative on 10/27/2023.  Bilateral hand pain - 02/19/22: Mild degenerative changes in interphalangeal joints bilaterally, right > left.  X-rays of both hands were consistent with osteoarthritis on 10/27/2023.  Primary osteoarthritis of both knees - X-rays of both knees were consistent with mild osteoarthritis.  No chondrocalcinosis noted.  Primary osteoarthritis of both feet  Chronic hip pain (Secondary source of pain) (Left)  Long term (current) use of opiate analgesic  Chronic pain syndrome - She takes Oxycodone  10 mg 2 times daily as needed and methadone as prescribed by pain management.  Failed back surgical syndrome  Lumbar central spinal stenosis (L4-5)  Lumbar facet syndrome (Bilateral)  Neurogenic pain  Vitamin D  deficiency  Adjustment disorder with depressed mood  CCC (chronic calculous cholecystitis)  History of diverticulosis  Prediabetes  Orders: No orders of the defined types were placed in this encounter.  No orders of the defined types were placed in this encounter.   Face-to-face time spent with patient was *** minutes. Greater than 50% of time was spent in counseling and coordination of care.  Follow-Up Instructions: No follow-ups on file.   Annette CHRISTELLA Craze, PA-C  Note - This record has been created using Dragon software.  Chart creation errors have been sought, but may not always  have been located. Such creation errors do not reflect on  the standard of medical care.

## 2023-12-09 NOTE — Telephone Encounter (Signed)
 Patient advised Her insurance denied Otezla. Could consider sulfasalazine but she would need to schedule another visit to obtain consent and review indications, complications, and potential side effects. Patient scheduled for 12/16/2023.

## 2023-12-09 NOTE — Telephone Encounter (Signed)
 Patient contacted the office regarding her labs that we requested she have. Patient states she went to have them drawn at [facility] and was unable to have them done due to having a $1000 bill that she is unable to pay at this time. Patient was advised she could try to have them with [facility]. Patient states she will try to come to the office to have them done as soon as possible. Patient is also inquiring if there is a milder medication she could take. She is concerned about infection since her granddaughter is in daycare and is always sick.

## 2023-12-09 NOTE — Telephone Encounter (Signed)
 Her insurance denied Otezla. Could consider sulfasalazine but she would need to schedule another visit to obtain consent and review indications, complications, and potential side effects.

## 2023-12-10 NOTE — Telephone Encounter (Signed)
 [name] CROME, LPN    07[birth year] 89:93 AM Note Patient contacted the office regarding her labs that we requested she have. Patient states she went to have them drawn at [facility] and was unable to have them done due to having a $1000 bill that she is unable to pay at this time. Patient was advised she could try to have them with [facility]. Patient states she will try to come to the office to have them done as soon as possible. Patient is also inquiring if there is a milder medication she could take. She is concerned about infection since her granddaughter is in daycare and is always sick.      Closing encounter since patient does not want to try Skyrizi at this time

## 2023-12-16 ENCOUNTER — Telehealth: Payer: Self-pay | Admitting: Pharmacist

## 2023-12-16 ENCOUNTER — Encounter: Payer: Self-pay | Admitting: Physician Assistant

## 2023-12-16 ENCOUNTER — Ambulatory Visit: Attending: Physician Assistant | Admitting: Physician Assistant

## 2023-12-16 VITALS — BP 111/73 | HR 66 | Resp 16 | Ht 66.0 in | Wt 170.6 lb

## 2023-12-16 DIAGNOSIS — Z79899 Other long term (current) drug therapy: Secondary | ICD-10-CM | POA: Diagnosis not present

## 2023-12-16 DIAGNOSIS — M19072 Primary osteoarthritis, left ankle and foot: Secondary | ICD-10-CM

## 2023-12-16 DIAGNOSIS — M48061 Spinal stenosis, lumbar region without neurogenic claudication: Secondary | ICD-10-CM

## 2023-12-16 DIAGNOSIS — M25552 Pain in left hip: Secondary | ICD-10-CM

## 2023-12-16 DIAGNOSIS — M79642 Pain in left hand: Secondary | ICD-10-CM

## 2023-12-16 DIAGNOSIS — L409 Psoriasis, unspecified: Secondary | ICD-10-CM | POA: Diagnosis not present

## 2023-12-16 DIAGNOSIS — L4050 Arthropathic psoriasis, unspecified: Secondary | ICD-10-CM | POA: Diagnosis not present

## 2023-12-16 DIAGNOSIS — Z8719 Personal history of other diseases of the digestive system: Secondary | ICD-10-CM

## 2023-12-16 DIAGNOSIS — R7303 Prediabetes: Secondary | ICD-10-CM

## 2023-12-16 DIAGNOSIS — R768 Other specified abnormal immunological findings in serum: Secondary | ICD-10-CM

## 2023-12-16 DIAGNOSIS — M47816 Spondylosis without myelopathy or radiculopathy, lumbar region: Secondary | ICD-10-CM

## 2023-12-16 DIAGNOSIS — G894 Chronic pain syndrome: Secondary | ICD-10-CM

## 2023-12-16 DIAGNOSIS — E559 Vitamin D deficiency, unspecified: Secondary | ICD-10-CM

## 2023-12-16 DIAGNOSIS — G8929 Other chronic pain: Secondary | ICD-10-CM

## 2023-12-16 DIAGNOSIS — M961 Postlaminectomy syndrome, not elsewhere classified: Secondary | ICD-10-CM

## 2023-12-16 DIAGNOSIS — M79641 Pain in right hand: Secondary | ICD-10-CM

## 2023-12-16 DIAGNOSIS — M792 Neuralgia and neuritis, unspecified: Secondary | ICD-10-CM

## 2023-12-16 DIAGNOSIS — M19071 Primary osteoarthritis, right ankle and foot: Secondary | ICD-10-CM

## 2023-12-16 DIAGNOSIS — M170 Bilateral primary osteoarthritis of knee: Secondary | ICD-10-CM

## 2023-12-16 DIAGNOSIS — Z79891 Long term (current) use of opiate analgesic: Secondary | ICD-10-CM

## 2023-12-16 DIAGNOSIS — K8010 Calculus of gallbladder with chronic cholecystitis without obstruction: Secondary | ICD-10-CM

## 2023-12-16 DIAGNOSIS — F4321 Adjustment disorder with depressed mood: Secondary | ICD-10-CM

## 2023-12-16 NOTE — Patient Instructions (Addendum)
 Standing Labs We placed an order today for your standing lab work.   Please have your standing labs drawn in 1 month then every 3 months  Please have your labs drawn 2 weeks prior to your appointment so that the provider can discuss your lab results at your appointment, if possible.  Please note that you may see your imaging and lab results in MyChart before we have reviewed them. We will contact you once all results are reviewed. Please allow our office up to 72 hours to thoroughly review all of the results before contacting the office for clarification of your results.  WALK-IN LAB HOURS  Monday through Thursday from 8:00 am -12:30 pm and 1:00 pm-4:30 pm and Friday from 8:00 am-12:00 pm.  Patients with office visits requiring labs will be seen before walk-in labs.  You may encounter longer than normal wait times. Please allow additional time. Wait times may be shorter on  Monday and Thursday afternoons.  We do not book appointments for walk-in labs. We appreciate your patience and understanding with our staff.   Labs are drawn by [facility]. Please bring your co-pay at the time of your lab draw.  You may receive a bill from [facility] for your lab work.  Please note if you are on Hydroxychloroquine and and an order has been placed for a Hydroxychloroquine level,  you will need to have it drawn 4 hours or more after your last dose.  If you wish to have your labs drawn at another location, please call the office 24 hours in advance so we can fax the orders.  The office is located at [address], [place], [place] [address]   If you have any questions regarding directions or hours of operation,  please call [phone].   As a reminder, please drink plenty of water  prior to coming for your lab work. Thanks!   Sulfasalazine Delayed-Release Tablets What is this medication? SULFASALAZINE (sul fa SAL a zeen) treats ulcerative colitis. It may also be used to treat rheumatoid arthritis  when other medications have not worked or cannot be tolerated. It works by decreasing inflammation. It belongs to a group of medications called salicylates. This medicine may be used for other purposes; ask your health care provider or pharmacist if you have questions. COMMON BRAND NAME(S): Azulfidine En-Tabs, Sulfazine EC What should I tell my care team before I take this medication? They need to know if you have any of these conditions: Asthma Blood disorders or anemia Glucose-6-phosphate dehydrogenase (G6PD) deficiency Intestinal obstruction Kidney disease Liver disease Porphyria Urinary tract obstruction An unusual reaction to sulfasalazine, sulfa medications, salicylates, other medications, foods, dyes, or preservatives Pregnant or trying to get pregnant Breast-feeding How should I use this medication? Take this medication by mouth with a full glass of water . Take it as directed on the prescription label at the same time every day. Do not cut, crush, or chew this medication. Swallow capsules whole. You can take it with or without food. If it upsets your stomach, take it with food. Keep taking it unless your care team tells you to stop. Talk to your care team about the use of this medication in children. Special care may be needed. While this medication may be prescribed for children as young as 6 years for selected conditions, precautions do apply. Patients over 65 years old may have a stronger reaction and need a smaller dose. Overdosage: If you think you have taken too much of this medicine contact a  poison control center or emergency room at once. NOTE: This medicine is only for you. Do not share this medicine with others. What if I miss a dose? If you miss a dose, take it as soon as you can. If it is almost time for your next dose, take only that dose. Do not take double or extra doses. What may interact with this medication? Digoxin Folic acid This list may not describe all  possible interactions. Give your health care provider a list of all the medicines, herbs, non-prescription drugs, or dietary supplements you use. Also tell them if you smoke, drink alcohol, or use illegal drugs. Some items may interact with your medicine. What should I watch for while using this medication? Visit your care team for regular checks on your progress. Tell your care team if your symptoms do not start to get better or if they get worse. You will need frequent blood and urine checks. This medication can make you more sensitive to the sun. Keep out of the sun. If you cannot avoid being in the sun, wear protective clothing and use sunscreen. Do not use sun lamps or tanning beds/booths. Drink plenty of water  while taking this medication. Tell your care team if you see the tablet in your stools. Your body may not be absorbing the medication. What side effects may I notice from receiving this medication? Side effects that you should report to your care team as soon as possible: Allergic reactions--skin rash, itching, hives, swelling of the face, lips, tongue, or throat Aplastic anemia--unusual weakness or fatigue, dizziness, headache, trouble breathing, increased bleeding or bruising Dry cough, shortness of breath or trouble breathing Heart muscle inflammation--unusual weakness or fatigue, shortness of breath, chest pain, fast or irregular heartbeat, dizziness, swelling of the ankles, feet, or hands Infection--fever, chills, cough, sore throat, wounds that don't heal, pain or trouble when passing urine, general feeling of discomfort or being unwell Kidney injury--decrease in the amount of urine, swelling of the ankles, hands, or feet Liver injury--right upper belly pain, loss of appetite, nausea, light-colored stool, dark yellow or brown urine, yellowing skin or eyes, unusual weakness or fatigue Rash, fever, and swollen lymph nodes Redness, blistering, peeling, or loosening of the skin,  including inside the mouth Side effects that usually do not require medical attention (report to your care team if they continue or are bothersome): Dark yellow or orange saliva, sweat, or urine Dizziness Headache Loss of appetite Nausea Upset stomach Vomiting This list may not describe all possible side effects. Call your doctor for medical advice about side effects. You may report side effects to FDA at 1-800-FDA-1088. Where should I keep my medication? Keep out of the reach of children and pets. Store at room temperature between 15 and 30 degrees C (59 and 86 degrees F). Get rid of any unused medication after the expiration date. To get rid of medications that are no longer needed or have expired: Take the medications to a medication take-back program. Check with your pharmacy or law enforcement to find a location. If your cannot return the medication, check the label or package insert to see if the medication should be thrown out in the garbage or flushed down the toilet. If you are not sure, ask your care team. If it is safe to put it in the trash, take the medication out of the container. Mix the medication with cat litter, dirt, coffee grounds, or other unwanted substance. Seal the mixture in a bag or container. Put it  in the trash. NOTE: This sheet is a summary. It may not cover all possible information. If you have questions about this medicine, talk to your doctor, pharmacist, or health care provider.  2024 Elsevier/Gold Standard (2021-04-19 00:00:00)

## 2023-12-16 NOTE — Progress Notes (Signed)
 Pharmacy Note  Subjective:  Patient presents today to [facility] for follow up office visit.  Patient seen by pharmacist for counseling on sulfasalazine for psoriatic arthritis and plaque psoriasis.  She is on meloxicam . Is naive to treatment.   Mickey was denied and patient did not want to take Skyrizi  Objective: CMP     Component Value Date/Time   NA 142 10/27/2023 1128   NA 139 12/05/2016 1122   NA 140 04/16/2013 0845   K 4.6 10/27/2023 1128   K 3.6 04/16/2013 0845   CL 104 10/27/2023 1128   CL 107 04/16/2013 0845   CO2 31 10/27/2023 1128   CO2 27 04/16/2013 0845   GLUCOSE 100 (H) 10/27/2023 1128   GLUCOSE 139 (H) 04/16/2013 0845   BUN 19 10/27/2023 1128   BUN 17 12/05/2016 1122   BUN 23 (H) 04/16/2013 0845   CREATININE 1.02 10/27/2023 1128   CALCIUM 9.6 10/27/2023 1128   CALCIUM 8.6 04/16/2013 0845   PROT 6.7 10/27/2023 1128   PROT 6.6 12/05/2016 1122   PROT 6.6 04/16/2013 0845   ALBUMIN 4.3 02/19/2022 1044   ALBUMIN 4.4 12/05/2016 1122   ALBUMIN 3.5 04/16/2013 0845   AST 15 10/27/2023 1128   AST 21 04/16/2013 0845   ALT 10 10/27/2023 1128   ALT 21 04/16/2013 0845   ALKPHOS 62 02/19/2022 1044   ALKPHOS 61 04/16/2013 0845   BILITOT 0.5 10/27/2023 1128   BILITOT 0.4 12/05/2016 1122   BILITOT 0.2 04/16/2013 0845   GFRNONAA >60 04/12/2020 1142   GFRNONAA 79 02/10/2020 1013   GFRAA 92 02/10/2020 1013    CBC    Component Value Date/Time   WBC 7.6 10/27/2023 1128   RBC 4.83 10/27/2023 1128   HGB 13.6 10/27/2023 1128   HGB 13.7 09/18/2016 0903   HCT 42.5 10/27/2023 1128   HCT 40.7 09/18/2016 0903   PLT 251 10/27/2023 1128   PLT 268 09/18/2016 0903   MCV 88.0 10/27/2023 1128   MCV 88 09/18/2016 0903   MCV 89 04/16/2013 0845   MCH 28.2 10/27/2023 1128   MCHC 32.0 10/27/2023 1128   RDW 12.6 10/27/2023 1128   RDW 15.1 09/18/2016 0903   RDW 12.7 04/16/2013 0845   LYMPHSABS 3.3 04/12/2020 1142   LYMPHSABS 4.5 (H) 09/18/2016 0903   MONOABS 0.5  04/12/2020 1142   EOSABS 220 10/27/2023 1128   EOSABS 0.2 09/18/2016 0903   BASOSABS 61 10/27/2023 1128   BASOSABS 0.0 09/18/2016 0903     Baseline Immunosuppressant Therapy Labs TB GOLD   Hepatitis Panel    Latest Ref Rng & Units 01/17/2021    1:52 PM  Hepatitis  Hep C Ab NON-REACTIVE NON-REACTIVE    HIV Lab Results  Component Value Date   HIV NON-REACTIVE 01/17/2021   Immunoglobulins   SPEP    Latest Ref Rng & Units 10/27/2023   11:28 AM  Serum Protein Electrophoresis  Total Protein 6.1 - 8.1 g/dL 6.7    Chest x-Beaston: 87/68/7979 - Negative.  No active cardiopulmonary disease.  Does the patient have an allergy to sulfa drugs? No  Assessment/Plan: Patient was counseled on the purpose, proper use, and adverse effects of sulfasalazine including risk of infection and chance of nausea, headache, and sun sensitivity.  Also discussed risk of skin rash and advised patient to stop the medication and let us  know if she develops a rash. Also discussed for the potential of discoloration of the urine, sweat, or tears.  Advised patient to  avoid live vaccines.  Recommend annual influenza, Pneumovax 23, Prevnar 13, and Shingrix as indicated.   Reviewed the importance of frequent labs to monitor liver, kidneys, and blood counts.  Standing orders placed and patient to return 1 month after starting therapy and then every 3 months.  Provided patient with educational materials on sulfasalazine and answered all questions.  Patient consented to sulfasalazine use, and consent will be uploaded into the media tab.    Patient dose will be sulfasalazine 1000mg  twice daily.  Prescription will be sent to pharmacy pending lab results and insurance approval.  Ankur Snowdon, PharmD, MPH, BCPS, CPP Clinical Pharmacist (Rheumatology and Pulmonology)

## 2023-12-16 NOTE — Telephone Encounter (Signed)
 Pending G6PD from today, patient will be sulfasalazine new start  Dose: 1000mg  twice daily  Sherry Pennant, PharmD, MPH, BCPS, CPP Clinical Pharmacist (Rheumatology and Pulmonology)

## 2023-12-18 ENCOUNTER — Telehealth: Payer: Self-pay

## 2023-12-18 NOTE — Telephone Encounter (Signed)
 Earnie, a pharmacist from [facility], contacted the office to request a prescription of Skyrizi for the patient. Myles Earnie I did not believe the patient was starting Skyrizi. Earnie states they received a prior authorization approval for the patient for Skyrizi from 11/28/2023-11/27/2024. Myles Earnie I would need to send a message to the pharmacy team to get clarification. Marie's call back number is [phone]. Please advise.

## 2023-12-18 NOTE — Telephone Encounter (Signed)
 Patient does not want to start Skyrizi due to risk for immunosuppression. Returned call to [facility] to advise of this. They have notated. Nothing further needed  Sherry Pennant, PharmD, MPH, BCPS, CPP Clinical Pharmacist (Rheumatology and Pulmonology)

## 2023-12-19 ENCOUNTER — Ambulatory Visit: Payer: Self-pay | Admitting: Physician Assistant

## 2023-12-19 LAB — GLUCOSE 6 PHOSPHATE DEHYDROGENASE: G-6PDH: 15.4 U/g{Hb} (ref 7.0–20.5)

## 2023-12-19 MED ORDER — SULFASALAZINE 500 MG PO TABS
1000.0000 mg | ORAL_TABLET | Freq: Two times a day (BID) | ORAL | 0 refills | Status: DC
Start: 2023-12-19 — End: 2024-01-12

## 2023-12-19 NOTE — Telephone Encounter (Signed)
 See lab note for details.

## 2023-12-19 NOTE — Progress Notes (Signed)
G6PDH WNL.  Ok to send in sulfasalazine.

## 2024-01-06 ENCOUNTER — Telehealth: Payer: Self-pay

## 2024-01-06 DIAGNOSIS — M79642 Pain in left hand: Secondary | ICD-10-CM

## 2024-01-06 DIAGNOSIS — M79671 Pain in right foot: Secondary | ICD-10-CM

## 2024-01-06 NOTE — Telephone Encounter (Signed)
 Patient contacted the office to advise that the sulfasalazine  is not working and she's still in lots of pain. She has missed some work because of the pain. Pain is in her wrist, feet, and knees. Patient would like to know if there is any other medication that she can try ? Please advise

## 2024-01-06 NOTE — Addendum Note (Signed)
 Addended by: Kihanna Kamiya M on: 01/06/2024 04:58 PM   Modules accepted: Orders

## 2024-01-06 NOTE — Telephone Encounter (Signed)
 Patient states she did not deny the Skyrizi she was under the impression that her insurance was not covering it. Patient would like to start the Skyrizi if it is possible.

## 2024-01-06 NOTE — Telephone Encounter (Signed)
 I previously called the patient to discuss side effects of skyrizi since insurance denied otezla and she did not want to take a biologic/immunosuppressive agent-has she changed her mind?  Insurance did approve skyrizi but she opted to try sulfasalazine  first.

## 2024-01-06 NOTE — Telephone Encounter (Signed)
 I spoke with patient--she is currently having headaches, GI side effects, and fever blisters--she plans to discontinue sulfasalazine .  She has noticed increased pain in both wrists, both knees, and both feet. She is having severe pain on the bottom of both feet and describes it as a aching sensation.   She had previously declined the use of Skyrizi due to not wanting to have a centimeter suppressive labs or be on any immunosuppressive agents. She is not apprehensive to initiate Skyrizi due to the concern that she has rheumatoid arthritis.  Discussed that her clinical features are not consistent with rheumatoid arthritis and that her rheumatoid factor is only borderline positive. Patient states that she has read that fever blisters are a secondary condition to rheumatoid arthritis and she is concerned about the diagnosis offered to place a new referral for a second opinion to another rheumatologist for further evaluation and management.  She was in agreement.

## 2024-01-06 NOTE — Telephone Encounter (Signed)
 Patient needs all baseline labs drawn before we can start her on Skyrizi

## 2024-01-06 NOTE — Telephone Encounter (Signed)
 Sulfasalazine  can take 3 months to reach full efficacy.  She declined the use of skyrizi and tremfya and insurance denied otezla.

## 2024-01-06 NOTE — Telephone Encounter (Signed)
 Patient would like to start Skyrizi. Can you resubmit for approval to her insurance and then if approved get her scheduled for the initial start and consent. Please advise.

## 2024-01-07 NOTE — Telephone Encounter (Signed)
 Patient contacted the office again and advised she wanted to let Waddell know that she would like to continue the Sulfasalazine  for now and wait for a couple of months to see if it will work. Patient states she would like to stay with our office for now.

## 2024-01-07 NOTE — Telephone Encounter (Signed)
 Pharmacy team closing follow-up - patient deferred to [facility] for second opinion

## 2024-01-11 ENCOUNTER — Other Ambulatory Visit: Payer: Self-pay | Admitting: Physician Assistant

## 2024-01-12 NOTE — Progress Notes (Deleted)
 Office Visit Note  Patient: [name]             Date of Birth: [birth year]           MRN: [id number]             PCP: Tatiana Delon GRADE, MD Referring: Tatiana Delon GRADE, MD Visit Date: 01/26/2024 Occupation: @GUAROCC @  Subjective:  Discuss medication options   History of Present Illness: [name] is a 53 y.o. female with history of psoriatic arthritis.       Activities of Daily Living:  Patient reports morning stiffness for *** {minute/hour:19697}.   Patient {ACTIONS;DENIES/REPORTS:21021675::Denies} nocturnal pain.  Difficulty dressing/grooming: {ACTIONS;DENIES/REPORTS:21021675::Denies} Difficulty climbing stairs: {ACTIONS;DENIES/REPORTS:21021675::Denies} Difficulty getting out of chair: {ACTIONS;DENIES/REPORTS:21021675::Denies} Difficulty using hands for taps, buttons, cutlery, and/or writing: {ACTIONS;DENIES/REPORTS:21021675::Denies}  No Rheumatology ROS completed.   PMFS History:  Patient Active Problem List   Diagnosis Date Noted  . Acute non-recurrent frontal sinusitis 03/21/2022  . Bilateral hand pain 02/19/2022  . Rash 02/19/2022  . Rectal polyp   . Degeneration of lumbar intervertebral disc 01/17/2021  . CCC (chronic calculous cholecystitis) 03/14/2020  . Cyst of right kidney 01/08/2018  . Tobacco use 08/25/2017  . Preventative health care 08/25/2017  . Trigger point with back pain (Left) 07/21/2017  . Mood disorder (HCC) 01/30/2017  . Neurogenic pain 01/14/2017  . Lumbar facet syndrome (Bilateral) 12/18/2016  . Lumbar spondylosis 12/18/2016  . Chronic musculoskeletal pain 12/17/2016  . Chronic pain syndrome 12/05/2016  . Long term (current) use of opiate analgesic 12/05/2016  . Long term prescription opiate use 12/05/2016  . Opiate use (85.5 MME/Day) 12/05/2016  . Chronic hip pain (Secondary source of pain) (Left) 12/05/2016  . Failed back surgical syndrome 12/05/2016  . Discogenic low back pain 12/05/2016  . Lumbar discogenic pain syndrome  12/05/2016  . Chronic sacroiliac joint pain (Left) 12/05/2016  . Elevated rheumatoid factor 10/22/2016  . Chronic, continuous use of opioids 10/03/2016  . Polyarthralgia 10/02/2016  . Low back pain 07/22/2016  . Lumbosacral spondylosis without myelopathy 03/12/2015  . Lumbar central spinal stenosis (L4-5) 03/12/2015    Class: History of  . Adjustment disorder with depressed mood 03/10/2015  . Stressful life events affecting family and household 03/10/2015  . Vitamin D  deficiency 03/10/2015    Past Medical History:  Diagnosis Date  . Allergy   . Anxiety   . Arthritis   . Chronic bilateral low back pain 07/22/2016   Started in her 20's; managed by pain clinic  . Depression   . Kidney cysts 12/21/2017  . Rheumatoid arthritis (HCC)     Family History  Problem Relation Age of Onset  . Hypertension Mother   . Diabetes Mother   . Gallbladder disease Mother   . Hypertension Father   . Hyperlipidemia Father   . Arthritis Father   . Lung disease Father   . Heart disease Father   . Lymphoma Father   . Depression Sister   . Depression Sister   . Healthy Brother   . Heart disease Maternal Grandfather   . Cancer Maternal Aunt        breast  . Breast cancer Maternal Aunt 34  . Stroke Neg Hx    Past Surgical History:  Procedure Laterality Date  . ABDOMINAL HYSTERECTOMY    . BACK SURGERY     two back sx one in [place] and one at [place]  . BREAST CYST ASPIRATION    . CESAREAN SECTION    . COLONOSCOPY  WITH PROPOFOL  N/A 01/22/2021   Procedure: COLONOSCOPY WITH BIOPSY;  Surgeon: Jinny Carmine, MD;  Location: [facility];  Service: Endoscopy;  Laterality: N/A;  . POLYPECTOMY N/A 01/22/2021   Procedure: POLYPECTOMY;  Surgeon: Jinny Carmine, MD;  Location: [facility];  Service: Endoscopy;  Laterality: N/A;  . SPINE SURGERY    . SPINE SURGERY  09/09/2015  . TONSILLECTOMY     Social History   Social History Narrative   2 grown children   Immunization History   Administered Date(s) Administered  . Influenza,inj,Quad PF,6+ Mos 08/02/2015, 07/22/2016, 03/17/2018, 02/19/2022  . Influenza-Unspecified 03/11/2014  . PFIZER(Purple Top)SARS-COV-2 Vaccination 09/08/2019  . Tdap 03/11/2014     Objective: Vital Signs: There were no vitals taken for this visit.   Physical Exam Vitals and nursing note reviewed.  Constitutional:      Appearance: She is well-developed.  HENT:     Head: Normocephalic and atraumatic.  Eyes:     Conjunctiva/sclera: Conjunctivae normal.  Cardiovascular:     Rate and Rhythm: Normal rate and regular rhythm.     Heart sounds: Normal heart sounds.  Pulmonary:     Effort: Pulmonary effort is normal.     Breath sounds: Normal breath sounds.  Abdominal:     General: Bowel sounds are normal.     Palpations: Abdomen is soft.  Musculoskeletal:     Cervical back: Normal range of motion.  Lymphadenopathy:     Cervical: No cervical adenopathy.  Skin:    General: Skin is warm and dry.     Capillary Refill: Capillary refill takes less than 2 seconds.  Neurological:     Mental Status: She is alert and oriented to person, place, and time.  Psychiatric:        Behavior: Behavior normal.     Musculoskeletal Exam: ***  CDAI Exam: CDAI Score: -- Patient Global: --; Provider Global: -- Swollen: --; Tender: -- Joint Exam 01/26/2024   No joint exam has been documented for this visit   There is currently no information documented on the homunculus. Go to the Rheumatology activity and complete the homunculus joint exam.  Investigation: No additional findings.  Imaging: No results found.  Recent Labs: Lab Results  Component Value Date   WBC 7.6 10/27/2023   HGB 13.6 10/27/2023   PLT 251 10/27/2023   NA 142 10/27/2023   K 4.6 10/27/2023   CL 104 10/27/2023   CO2 31 10/27/2023   GLUCOSE 100 (H) 10/27/2023   BUN 19 10/27/2023   CREATININE 1.02 10/27/2023   BILITOT 0.5 10/27/2023   ALKPHOS 62 02/19/2022   AST 15  10/27/2023   ALT 10 10/27/2023   PROT 6.7 10/27/2023   ALBUMIN 4.3 02/19/2022   CALCIUM 9.6 10/27/2023   GFRAA 92 02/10/2020    Speciality Comments: No specialty comments available.  Procedures:  No procedures performed Allergies: Codeine   Assessment / Plan:     Visit Diagnoses: Psoriatic arthritis (HCC)  Psoriasis  High risk medication use  Bilateral hand pain  Elevated rheumatoid factor  Primary osteoarthritis of both knees  Primary osteoarthritis of both feet  Chronic hip pain (Secondary source of pain) (Left)  Long term (current) use of opiate analgesic  Chronic pain syndrome  Failed back surgical syndrome  Lumbar central spinal stenosis (L4-5)  Lumbar facet syndrome (Bilateral)  Neurogenic pain  Vitamin D  deficiency  Adjustment disorder with depressed mood  CCC (chronic calculous cholecystitis)  History of diverticulosis  Prediabetes  Orders: No orders of  the defined types were placed in this encounter.  No orders of the defined types were placed in this encounter.   Face-to-face time spent with patient was *** minutes. Greater than 50% of time was spent in counseling and coordination of care.  Follow-Up Instructions: No follow-ups on file.   Waddell CHRISTELLA Craze, PA-C  Note - This record has been created using Dragon software.  Chart creation errors have been sought, but may not always  have been located. Such creation errors do not reflect on  the standard of medical care.

## 2024-01-12 NOTE — Telephone Encounter (Signed)
 Last Fill: 12/19/2023  Labs: 10/27/2023 Glucose 100 Rest of CMP and CBC WNL   Next Visit: 01/26/2024  Last Visit: 12/16/2023  DX: Psoriatic arthritis   Current Dose per office note 12/16/2023: sulfasalazine  500 mg 2 tablets twice daily   Okay to refill Sulfasalazine ?

## 2024-01-13 NOTE — Telephone Encounter (Signed)
 Please see if the patient can come in next week--if Dr. Dolphus has an opening that would be ideal-I would like her opinion on treatment options.  If not I will see her.

## 2024-01-16 ENCOUNTER — Emergency Department

## 2024-01-16 ENCOUNTER — Other Ambulatory Visit: Payer: Self-pay

## 2024-01-16 ENCOUNTER — Emergency Department
Admission: EM | Admit: 2024-01-16 | Discharge: 2024-01-16 | Disposition: A | Discharge status: Home / Self Care | Attending: Emergency Medicine | Admitting: Emergency Medicine

## 2024-01-16 DIAGNOSIS — Y92009 Unspecified place in unspecified non-institutional (private) residence as the place of occurrence of the external cause: Secondary | ICD-10-CM

## 2024-01-16 DIAGNOSIS — M545 Low back pain, unspecified: Secondary | ICD-10-CM | POA: Diagnosis not present

## 2024-01-16 DIAGNOSIS — S0990XA Unspecified injury of head, initial encounter: Secondary | ICD-10-CM | POA: Diagnosis present

## 2024-01-16 DIAGNOSIS — S0093XA Contusion of unspecified part of head, initial encounter: Secondary | ICD-10-CM

## 2024-01-16 DIAGNOSIS — S1083XA Contusion of other specified part of neck, initial encounter: Secondary | ICD-10-CM | POA: Diagnosis not present

## 2024-01-16 DIAGNOSIS — S0083XA Contusion of other part of head, initial encounter: Secondary | ICD-10-CM | POA: Diagnosis not present

## 2024-01-16 DIAGNOSIS — W091XXA Fall from playground swing, initial encounter: Secondary | ICD-10-CM | POA: Insufficient documentation

## 2024-01-16 MED ORDER — METHOCARBAMOL 500 MG PO TABS: ORAL_TABLET | ORAL | 0 refills | Status: AC

## 2024-01-16 MED ORDER — OXYCODONE HCL 5 MG PO TABS
5.0000 mg | ORAL_TABLET | Freq: Once | ORAL | Status: AC
  Administered 2024-01-16: 5 mg via ORAL
  Filled 2024-01-16: qty 1

## 2024-01-16 MED ORDER — METHOCARBAMOL 500 MG PO TABS
1000.0000 mg | ORAL_TABLET | Freq: Once | ORAL | Status: AC
  Administered 2024-01-16: 1000 mg via ORAL
  Filled 2024-01-16: qty 2

## 2024-01-16 NOTE — ED Notes (Addendum)
 See triage note  Presents s/p fall  States she was in a swing   and fell backwards  Hitting her lower back and head   No LOC  States this happened yesterday

## 2024-01-16 NOTE — Discharge Instructions (Signed)
 Follow-up with your primary care provider if any continued problems or concerns.  A prescription for methocarbamol  was sent to the pharmacy to take as needed for muscle spasms which can be 1 or 2 tablets every 6 hours as needed.  Continue with your regular medication as prescribed by your doctor.  You may also use ice or heat to your muscles as needed.  Return to the emergency department if any severe worsening of your symptoms.

## 2024-01-16 NOTE — ED Provider Notes (Signed)
 [facility] Provider Note    Event Date/Time   First MD Initiated Contact with Patient 01/16/24 1150     (approximate)   History   Fall   HPI  [name] is a 53 y.o. female   presents to the ED via EMS from home.  Patient states that she fell backwards out of a swing yesterday hitting the lower part of her back and her head.  She denies any loss of consciousness, dizziness, headache, visual changes or vomiting but has had some nausea.  Currently she rates her pain as 10 out of 10.  Patient currently is unknown a pain management for chronic pain.      Physical Exam   Triage Vital Signs: ED Triage Vitals  Encounter Vitals Group     BP 01/16/24 1058 128/68     Girls Systolic BP Percentile --      Girls Diastolic BP Percentile --      Boys Systolic BP Percentile --      Boys Diastolic BP Percentile --      Pulse Rate 01/16/24 1058 77     Resp 01/16/24 1058 18     Temp 01/16/24 1058 97.8 F (36.6 C)     Temp Source 01/16/24 1058 Oral     SpO2 01/16/24 1058 98 %     Weight 01/16/24 1146 170 lb 10.2 oz (77.4 kg)     Height 01/16/24 1146 5' 6 (1.676 m)     Head Circumference --      Peak Flow --      Pain Score 01/16/24 1058 6     Pain Loc --      Pain Education --      Exclude from Growth Chart --     Most recent vital signs: Vitals:   01/16/24 1058  BP: 128/68  Pulse: 77  Resp: 18  Temp: 97.8 F (36.6 C)  SpO2: 98%     General: Awake, no distress.  Alert, talkative, able to answer questions appropriately. CV:  Good peripheral perfusion.  Heart regular rate rhythm. Resp:  Normal effort.  Lungs clear bilaterally.  No tenderness on palpation of the ribs bilaterally. Abd:  No distention.  Other:  PERRLA, EOMI's, cranial nerves II through XII grossly intact, speech is normal.  There is some minimal diffuse cervical tenderness to palpation.  Patient is able to move upper and lower extremities without restriction.  Tenderness is noted on  palpation of the lumbar spine.   ED Results / Procedures / Treatments   Labs (all labs ordered are listed, but only abnormal results are displayed) Labs Reviewed - No data to display   RADIOLOGY  CT head and cervical spine per radiology is negative for any acute intracranial changes.  CT cervical spine shows multilevel spondylosis greatest at the C5-C6 area with mild spinal canal stenosis.  Lumbar spine x-Gagen images were reviewed and interpreted by myself independent of the radiologist is negative for any acute changes or hardware failure from previous back surgery.  Official radiology report is negative for acute fracture.     PROCEDURES:  Critical Care performed:   Procedures   MEDICATIONS ORDERED IN ED: Medications  oxyCODONE  (Oxy IR/ROXICODONE ) immediate release tablet 5 mg (5 mg Oral Given 01/16/24 1251)  methocarbamol  (ROBAXIN ) tablet 1,000 mg (1,000 mg Oral Given 01/16/24 1251)     IMPRESSION / MDM / ASSESSMENT AND PLAN / ED COURSE  I reviewed the triage vital signs and the  nursing notes.   Differential diagnosis includes, but is not limited to, head injury, head contusion, cervical strain, contusion, subluxation, lumbar pain, exacerbation of chronic lumbar pain, compression fracture, musculoskeletal pain secondary to fall.   53 year old female presents to the ED with complaint of headache, contusions and lumbar spine pain secondary to a fall that occurred last evening.  Patient has history of chronic back pain and is on a medication management program.  Today she has not taken any of her medications but routinely takes oxycodone  10 mg, gabapentin  and methadone.  X-rays and CT were reassuring and patient was given methocarbamol  1000 g p.o. while in the ED.  She became more comfortable.  She had her husband come to the ED to pick her up.  She will continue with her regular pain medication and a prescription for methocarbamol  was sent to the pharmacy, 1 or 2 tablets every 6  hours as needed for muscle spasms.  She will follow-up with her PCP or her pain management doctor as needed.     Patient's presentation is most consistent with acute presentation with potential threat to life or bodily function.  FINAL CLINICAL IMPRESSION(S) / ED DIAGNOSES   Final diagnoses:  Contusion of head and neck region  Acute on chronic low back pain  Fall in home, initial encounter     Rx / DC Orders   ED Discharge Orders          Ordered    methocarbamol  (ROBAXIN ) 500 MG tablet        01/16/24 1340             Note:  This document was prepared using Dragon voice recognition software and may include unintentional dictation errors.   Saunders Shona CROME, PA-C 01/16/24 1412    Claudene Rover, MD 01/16/24 [phone]

## 2024-01-16 NOTE — ED Triage Notes (Signed)
 Pt to ED via [facility] from home. Pt fell backwards out of swing yesterday. Pt reports left lower back pain and some head pain. Pain 10/10 with movement. Pt also reports some nausea.   151/85 HR  CBG 96 98%  18g LFA

## 2024-01-20 ENCOUNTER — Ambulatory Visit: Admitting: Dermatology

## 2024-01-26 ENCOUNTER — Ambulatory Visit: Admitting: Physician Assistant

## 2024-01-26 DIAGNOSIS — L409 Psoriasis, unspecified: Secondary | ICD-10-CM

## 2024-01-26 DIAGNOSIS — R7303 Prediabetes: Secondary | ICD-10-CM

## 2024-01-26 DIAGNOSIS — E559 Vitamin D deficiency, unspecified: Secondary | ICD-10-CM

## 2024-01-26 DIAGNOSIS — M47816 Spondylosis without myelopathy or radiculopathy, lumbar region: Secondary | ICD-10-CM

## 2024-01-26 DIAGNOSIS — M792 Neuralgia and neuritis, unspecified: Secondary | ICD-10-CM

## 2024-01-26 DIAGNOSIS — M170 Bilateral primary osteoarthritis of knee: Secondary | ICD-10-CM

## 2024-01-26 DIAGNOSIS — Z79891 Long term (current) use of opiate analgesic: Secondary | ICD-10-CM

## 2024-01-26 DIAGNOSIS — Z8719 Personal history of other diseases of the digestive system: Secondary | ICD-10-CM

## 2024-01-26 DIAGNOSIS — F4321 Adjustment disorder with depressed mood: Secondary | ICD-10-CM

## 2024-01-26 DIAGNOSIS — M961 Postlaminectomy syndrome, not elsewhere classified: Secondary | ICD-10-CM

## 2024-01-26 DIAGNOSIS — Z79899 Other long term (current) drug therapy: Secondary | ICD-10-CM

## 2024-01-26 DIAGNOSIS — M19071 Primary osteoarthritis, right ankle and foot: Secondary | ICD-10-CM

## 2024-01-26 DIAGNOSIS — G8929 Other chronic pain: Secondary | ICD-10-CM

## 2024-01-26 DIAGNOSIS — L4050 Arthropathic psoriasis, unspecified: Secondary | ICD-10-CM

## 2024-01-26 DIAGNOSIS — R768 Other specified abnormal immunological findings in serum: Secondary | ICD-10-CM

## 2024-01-26 DIAGNOSIS — G894 Chronic pain syndrome: Secondary | ICD-10-CM

## 2024-01-26 DIAGNOSIS — K8010 Calculus of gallbladder with chronic cholecystitis without obstruction: Secondary | ICD-10-CM

## 2024-01-26 DIAGNOSIS — M79642 Pain in left hand: Secondary | ICD-10-CM

## 2024-01-26 DIAGNOSIS — M48061 Spinal stenosis, lumbar region without neurogenic claudication: Secondary | ICD-10-CM

## 2024-02-20 DIAGNOSIS — Z419 Encounter for procedure for purposes other than remedying health state, unspecified: Secondary | ICD-10-CM | POA: Diagnosis not present

## 2024-02-23 DIAGNOSIS — Z87891 Personal history of nicotine dependence: Secondary | ICD-10-CM | POA: Diagnosis not present

## 2024-02-23 DIAGNOSIS — Z122 Encounter for screening for malignant neoplasm of respiratory organs: Secondary | ICD-10-CM | POA: Diagnosis not present

## 2024-02-26 DIAGNOSIS — Z780 Asymptomatic menopausal state: Secondary | ICD-10-CM | POA: Diagnosis not present

## 2024-02-26 DIAGNOSIS — Z1211 Encounter for screening for malignant neoplasm of colon: Secondary | ICD-10-CM | POA: Diagnosis not present

## 2024-02-26 DIAGNOSIS — L4050 Arthropathic psoriasis, unspecified: Secondary | ICD-10-CM | POA: Diagnosis not present

## 2024-02-26 DIAGNOSIS — M961 Postlaminectomy syndrome, not elsewhere classified: Secondary | ICD-10-CM | POA: Diagnosis not present

## 2024-02-26 DIAGNOSIS — Z79899 Other long term (current) drug therapy: Secondary | ICD-10-CM | POA: Diagnosis not present

## 2024-02-26 DIAGNOSIS — Z6827 Body mass index (BMI) 27.0-27.9, adult: Secondary | ICD-10-CM | POA: Diagnosis not present

## 2024-03-01 DIAGNOSIS — Z79899 Other long term (current) drug therapy: Secondary | ICD-10-CM | POA: Diagnosis not present

## 2024-03-04 DIAGNOSIS — R928 Other abnormal and inconclusive findings on diagnostic imaging of breast: Secondary | ICD-10-CM | POA: Diagnosis not present

## 2024-03-04 DIAGNOSIS — L4050 Arthropathic psoriasis, unspecified: Secondary | ICD-10-CM | POA: Diagnosis not present

## 2024-03-04 DIAGNOSIS — N281 Cyst of kidney, acquired: Secondary | ICD-10-CM | POA: Diagnosis not present

## 2024-03-04 DIAGNOSIS — Z1389 Encounter for screening for other disorder: Secondary | ICD-10-CM | POA: Diagnosis not present

## 2024-03-08 ENCOUNTER — Inpatient Hospital Stay
Admission: RE | Admit: 2024-03-08 | Discharge: 2024-03-08 | Disposition: A | Discharge status: Home / Self Care | Payer: Self-pay | Source: Ambulatory Visit | Attending: Family Medicine | Admitting: Family Medicine

## 2024-03-08 ENCOUNTER — Other Ambulatory Visit: Payer: Self-pay | Admitting: *Deleted

## 2024-03-08 DIAGNOSIS — Z1231 Encounter for screening mammogram for malignant neoplasm of breast: Secondary | ICD-10-CM

## 2024-03-10 ENCOUNTER — Other Ambulatory Visit: Payer: Self-pay | Admitting: Family Medicine

## 2024-03-10 DIAGNOSIS — N6489 Other specified disorders of breast: Secondary | ICD-10-CM

## 2024-03-15 ENCOUNTER — Other Ambulatory Visit: Payer: Self-pay | Admitting: Family Medicine

## 2024-03-15 DIAGNOSIS — N281 Cyst of kidney, acquired: Secondary | ICD-10-CM

## 2024-03-21 DIAGNOSIS — Z419 Encounter for procedure for purposes other than remedying health state, unspecified: Secondary | ICD-10-CM | POA: Diagnosis not present

## 2024-03-22 ENCOUNTER — Ambulatory Visit: Admission: RE | Admit: 2024-03-22

## 2024-03-25 DIAGNOSIS — Z6827 Body mass index (BMI) 27.0-27.9, adult: Secondary | ICD-10-CM | POA: Diagnosis not present

## 2024-03-25 DIAGNOSIS — Z79899 Other long term (current) drug therapy: Secondary | ICD-10-CM | POA: Diagnosis not present

## 2024-03-25 DIAGNOSIS — L4050 Arthropathic psoriasis, unspecified: Secondary | ICD-10-CM | POA: Diagnosis not present

## 2024-03-25 DIAGNOSIS — M961 Postlaminectomy syndrome, not elsewhere classified: Secondary | ICD-10-CM | POA: Diagnosis not present

## 2024-03-29 ENCOUNTER — Ambulatory Visit
Admission: RE | Admit: 2024-03-29 | Discharge: 2024-03-29 | Disposition: A | Discharge status: Home / Self Care | Source: Ambulatory Visit | Attending: Family Medicine | Admitting: Family Medicine

## 2024-03-29 ENCOUNTER — Ambulatory Visit: Admission: RE | Admit: 2024-03-29 | Source: Ambulatory Visit

## 2024-03-29 DIAGNOSIS — N6489 Other specified disorders of breast: Secondary | ICD-10-CM | POA: Diagnosis not present

## 2024-03-29 DIAGNOSIS — Z79899 Other long term (current) drug therapy: Secondary | ICD-10-CM | POA: Diagnosis not present

## 2024-04-05 ENCOUNTER — Ambulatory Visit: Admission: RE | Admit: 2024-04-05

## 2024-04-12 ENCOUNTER — Ambulatory Visit
Admission: RE | Admit: 2024-04-12 | Discharge: 2024-04-12 | Disposition: A | Discharge status: Home / Self Care | Source: Ambulatory Visit | Attending: Family Medicine | Admitting: Family Medicine

## 2024-04-12 DIAGNOSIS — K7689 Other specified diseases of liver: Secondary | ICD-10-CM | POA: Diagnosis not present

## 2024-04-12 DIAGNOSIS — K838 Other specified diseases of biliary tract: Secondary | ICD-10-CM | POA: Diagnosis not present

## 2024-04-12 DIAGNOSIS — N281 Cyst of kidney, acquired: Secondary | ICD-10-CM | POA: Diagnosis not present

## 2024-04-12 DIAGNOSIS — K5730 Diverticulosis of large intestine without perforation or abscess without bleeding: Secondary | ICD-10-CM | POA: Diagnosis not present

## 2024-04-12 MED ORDER — IOHEXOL 300 MG/ML  SOLN
100.0000 mL | Freq: Once | INTRAMUSCULAR | Status: AC | PRN
  Administered 2024-04-12: 100 mL via INTRAVENOUS

## 2024-04-14 ENCOUNTER — Other Ambulatory Visit: Payer: Self-pay | Admitting: *Deleted

## 2024-04-14 NOTE — Telephone Encounter (Signed)
 Attempted to contact patient and left message to advise patient to call the office and schedule appointment.

## 2024-04-14 NOTE — Telephone Encounter (Signed)
 Patient contacted the office stating we tried her on Sulfasalazine . Patient states that it made her sick and she had to stop it. Patient states she now has new insurance and would like to see if the Nelma might be covered. Patient advised to send a copy of her insurance cards through my chart so a PA can be initiated.

## 2024-04-20 DIAGNOSIS — Z23 Encounter for immunization: Secondary | ICD-10-CM | POA: Diagnosis not present

## 2024-04-20 DIAGNOSIS — Z1389 Encounter for screening for other disorder: Secondary | ICD-10-CM | POA: Diagnosis not present

## 2024-04-20 DIAGNOSIS — L4050 Arthropathic psoriasis, unspecified: Secondary | ICD-10-CM | POA: Diagnosis not present

## 2024-04-21 DIAGNOSIS — Z419 Encounter for procedure for purposes other than remedying health state, unspecified: Secondary | ICD-10-CM | POA: Diagnosis not present

## 2024-04-26 DIAGNOSIS — M961 Postlaminectomy syndrome, not elsewhere classified: Secondary | ICD-10-CM | POA: Diagnosis not present

## 2024-04-26 DIAGNOSIS — L4050 Arthropathic psoriasis, unspecified: Secondary | ICD-10-CM | POA: Diagnosis not present

## 2024-04-26 DIAGNOSIS — M542 Cervicalgia: Secondary | ICD-10-CM | POA: Diagnosis not present

## 2024-04-26 DIAGNOSIS — M549 Dorsalgia, unspecified: Secondary | ICD-10-CM | POA: Diagnosis not present

## 2024-04-26 DIAGNOSIS — Z79899 Other long term (current) drug therapy: Secondary | ICD-10-CM | POA: Diagnosis not present

## 2024-04-29 DIAGNOSIS — Z79899 Other long term (current) drug therapy: Secondary | ICD-10-CM | POA: Diagnosis not present

## 2024-05-04 NOTE — Addendum Note (Signed)
 Addended by: GRETTA LAURAINE HERO on: 05/04/2024 10:43 AM   Modules accepted: Orders

## 2024-05-04 NOTE — Telephone Encounter (Unsigned)
 Copied from CRM [id number]. Topic: Clinical - Medication Refill >> May 04, 2024  9:14 AM [name] wrote: Medication: triamcinolone  cream (KENALOG ) 0.1 %  Has the patient contacted their pharmacy? Yes Pharmacy says they do not have it.   This is the patient's preferred pharmacy:  [id number] - [place], [place] - [address] [place] [address] Phone: [phone] Fax: [phone]  Is this the correct pharmacy for this prescription? Yes If no, delete pharmacy and type the correct one.   Has the prescription been filled recently? Yes  Is the patient out of the medication? Yes  Has the patient been seen for an appointment in the last year OR does the patient have an upcoming appointment? Yes  Can we respond through MyChart? No   Agent: Please be advised that Rx refills may take up to 3 business days. We ask that you follow-up with your pharmacy.

## 2024-05-25 DIAGNOSIS — L4050 Arthropathic psoriasis, unspecified: Secondary | ICD-10-CM | POA: Diagnosis not present

## 2024-05-25 DIAGNOSIS — Z6828 Body mass index (BMI) 28.0-28.9, adult: Secondary | ICD-10-CM | POA: Diagnosis not present

## 2024-05-25 DIAGNOSIS — M961 Postlaminectomy syndrome, not elsewhere classified: Secondary | ICD-10-CM | POA: Diagnosis not present

## 2024-05-25 DIAGNOSIS — Z79899 Other long term (current) drug therapy: Secondary | ICD-10-CM | POA: Diagnosis not present

## 2024-05-31 DIAGNOSIS — L4050 Arthropathic psoriasis, unspecified: Secondary | ICD-10-CM | POA: Diagnosis not present

## 2024-05-31 DIAGNOSIS — Z79899 Other long term (current) drug therapy: Secondary | ICD-10-CM | POA: Diagnosis not present

## 2024-05-31 DIAGNOSIS — L409 Psoriasis, unspecified: Secondary | ICD-10-CM | POA: Diagnosis not present
